# Patient Record
Sex: Male | Born: 1935 | Race: White | Hispanic: No | Marital: Married | Smoking: Former smoker
Health system: Southern US, Community
[De-identification: ages and names within clinical notes are randomized; demographics above are authoritative.]

## PROBLEM LIST (undated history)

## (undated) DIAGNOSIS — T4145XA Adverse effect of unspecified anesthetic, initial encounter: Secondary | ICD-10-CM

## (undated) DIAGNOSIS — I350 Nonrheumatic aortic (valve) stenosis: Secondary | ICD-10-CM

## (undated) DIAGNOSIS — I714 Abdominal aortic aneurysm, without rupture, unspecified: Secondary | ICD-10-CM

## (undated) DIAGNOSIS — K922 Gastrointestinal hemorrhage, unspecified: Secondary | ICD-10-CM

## (undated) DIAGNOSIS — I2510 Atherosclerotic heart disease of native coronary artery without angina pectoris: Secondary | ICD-10-CM

## (undated) DIAGNOSIS — H3530 Unspecified macular degeneration: Secondary | ICD-10-CM

## (undated) DIAGNOSIS — I4891 Unspecified atrial fibrillation: Secondary | ICD-10-CM

## (undated) DIAGNOSIS — R52 Pain, unspecified: Secondary | ICD-10-CM

## (undated) DIAGNOSIS — I442 Atrioventricular block, complete: Secondary | ICD-10-CM

## (undated) DIAGNOSIS — T8859XA Other complications of anesthesia, initial encounter: Secondary | ICD-10-CM

## (undated) DIAGNOSIS — M1990 Unspecified osteoarthritis, unspecified site: Secondary | ICD-10-CM

## (undated) DIAGNOSIS — E785 Hyperlipidemia, unspecified: Secondary | ICD-10-CM

## (undated) DIAGNOSIS — I10 Essential (primary) hypertension: Secondary | ICD-10-CM

## (undated) DIAGNOSIS — T827XXA Infection and inflammatory reaction due to other cardiac and vascular devices, implants and grafts, initial encounter: Secondary | ICD-10-CM

## (undated) DIAGNOSIS — I509 Heart failure, unspecified: Secondary | ICD-10-CM

## (undated) DIAGNOSIS — A491 Streptococcal infection, unspecified site: Secondary | ICD-10-CM

## (undated) DIAGNOSIS — Z5189 Encounter for other specified aftercare: Secondary | ICD-10-CM

## (undated) DIAGNOSIS — H269 Unspecified cataract: Secondary | ICD-10-CM

## (undated) DIAGNOSIS — Z950 Presence of cardiac pacemaker: Secondary | ICD-10-CM

## (undated) DIAGNOSIS — I339 Acute and subacute endocarditis, unspecified: Secondary | ICD-10-CM

## (undated) DIAGNOSIS — I9789 Other postprocedural complications and disorders of the circulatory system, not elsewhere classified: Secondary | ICD-10-CM

## (undated) DIAGNOSIS — D649 Anemia, unspecified: Secondary | ICD-10-CM

## (undated) HISTORY — PX: OTHER SURGICAL HISTORY: SHX169

## (undated) HISTORY — DX: Encounter for other specified aftercare: Z51.89

## (undated) HISTORY — DX: Hyperlipidemia, unspecified: E78.5

## (undated) HISTORY — PX: EYE SURGERY: SHX253

## (undated) HISTORY — PX: JOINT REPLACEMENT: SHX530

## (undated) HISTORY — PX: VASCULAR SURGERY: SHX849

## (undated) HISTORY — PX: APPENDECTOMY: SHX54

## (undated) HISTORY — PX: PROSTATE SURGERY: SHX751

## (undated) HISTORY — PX: CORONARY ARTERY BYPASS GRAFT: SHX141

## (undated) HISTORY — PX: BACK SURGERY: SHX140

## (undated) HISTORY — PX: CARDIAC VALVE REPLACEMENT: SHX585

## (undated) HISTORY — PX: HERNIA REPAIR: SHX51

## (undated) HISTORY — DX: Heart failure, unspecified: I50.9

## (undated) HISTORY — PX: SPINE SURGERY: SHX786

## (undated) HISTORY — DX: Unspecified cataract: H26.9

## (undated) HISTORY — DX: Essential (primary) hypertension: I10

## (undated) HISTORY — PX: CARDIAC CATHETERIZATION: SHX172

## (undated) HISTORY — DX: Atherosclerotic heart disease of native coronary artery without angina pectoris: I25.10

---

## 1998-08-21 ENCOUNTER — Encounter: Payer: Self-pay | Admitting: Orthopedic Surgery

## 1998-08-21 ENCOUNTER — Ambulatory Visit (HOSPITAL_BASED_OUTPATIENT_CLINIC_OR_DEPARTMENT_OTHER): Admission: RE | Admit: 1998-08-21 | Discharge: 1998-08-21 | Payer: Self-pay | Admitting: Orthopedic Surgery

## 1998-09-04 ENCOUNTER — Ambulatory Visit (HOSPITAL_BASED_OUTPATIENT_CLINIC_OR_DEPARTMENT_OTHER): Admission: RE | Admit: 1998-09-04 | Discharge: 1998-09-04 | Payer: Self-pay | Admitting: Orthopedic Surgery

## 1999-11-13 ENCOUNTER — Inpatient Hospital Stay (HOSPITAL_COMMUNITY): Admission: EM | Admit: 1999-11-13 | Discharge: 1999-11-16 | Payer: Self-pay | Admitting: Emergency Medicine

## 1999-11-14 ENCOUNTER — Encounter: Payer: Self-pay | Admitting: Interventional Cardiology

## 1999-11-15 ENCOUNTER — Encounter: Payer: Self-pay | Admitting: Interventional Cardiology

## 2004-04-16 ENCOUNTER — Encounter: Admission: RE | Admit: 2004-04-16 | Discharge: 2004-04-16 | Payer: Self-pay | Admitting: Interventional Cardiology

## 2004-04-17 ENCOUNTER — Inpatient Hospital Stay (HOSPITAL_BASED_OUTPATIENT_CLINIC_OR_DEPARTMENT_OTHER): Admission: RE | Admit: 2004-04-17 | Discharge: 2004-04-17 | Payer: Self-pay | Admitting: Interventional Cardiology

## 2004-05-21 ENCOUNTER — Encounter (INDEPENDENT_AMBULATORY_CARE_PROVIDER_SITE_OTHER): Payer: Self-pay | Admitting: *Deleted

## 2004-05-21 ENCOUNTER — Inpatient Hospital Stay (HOSPITAL_COMMUNITY): Admission: RE | Admit: 2004-05-21 | Discharge: 2004-05-26 | Payer: Self-pay | Admitting: Cardiothoracic Surgery

## 2004-06-14 ENCOUNTER — Encounter (HOSPITAL_COMMUNITY): Admission: RE | Admit: 2004-06-14 | Discharge: 2004-07-09 | Payer: Self-pay | Admitting: Interventional Cardiology

## 2006-07-25 ENCOUNTER — Ambulatory Visit (HOSPITAL_COMMUNITY): Admission: RE | Admit: 2006-07-25 | Discharge: 2006-07-25 | Payer: Self-pay | Admitting: Interventional Cardiology

## 2006-11-25 ENCOUNTER — Ambulatory Visit: Payer: Self-pay | Admitting: Pulmonary Disease

## 2010-06-25 ENCOUNTER — Encounter (INDEPENDENT_AMBULATORY_CARE_PROVIDER_SITE_OTHER): Payer: Medicare PPO | Admitting: Surgery

## 2010-06-25 ENCOUNTER — Other Ambulatory Visit: Payer: Self-pay | Admitting: Surgery

## 2010-06-25 ENCOUNTER — Encounter (INDEPENDENT_AMBULATORY_CARE_PROVIDER_SITE_OTHER): Payer: Medicare PPO

## 2010-06-25 ENCOUNTER — Other Ambulatory Visit (INDEPENDENT_AMBULATORY_CARE_PROVIDER_SITE_OTHER): Payer: Medicare PPO

## 2010-06-25 ENCOUNTER — Ambulatory Visit
Admission: RE | Admit: 2010-06-25 | Discharge: 2010-06-25 | Disposition: A | Discharge status: Home / Self Care | Payer: Medicare PPO | Source: Ambulatory Visit | Attending: Surgery | Admitting: Surgery

## 2010-06-25 DIAGNOSIS — I714 Abdominal aortic aneurysm, without rupture: Secondary | ICD-10-CM

## 2010-06-25 DIAGNOSIS — Z01810 Encounter for preprocedural cardiovascular examination: Secondary | ICD-10-CM

## 2010-06-25 MED ORDER — IOHEXOL 350 MG/ML SOLN
60.0000 mL | Freq: Once | INTRAVENOUS | Status: AC | PRN
  Administered 2010-06-25: 60 mL via INTRAVENOUS

## 2010-06-26 NOTE — Assessment & Plan Note (Signed)
OFFICE VISIT  Ethan Lawrence, REEVER DOB:  1922-03-23                                       06/25/2010 AOZHY#:[id number]  REASON FOR VISIT:  Abdominal aortic aneurysm.  HISTORY:  This is an 77 year old gentleman I am seeing at request of Dr. Katrinka Blazing for evaluation of abdominal aortic aneurysm.  His aneurysm was detected on a noncontrast scan for low back and left hip pain. Incidental finding was a 6.4 cm infrarenal abdominal aortic aneurysm. The patient is asymptomatic.  He denies abdominal pain.  His CT scan cut off at his external iliac.  It is noncontrast.  The patient has a history of coronary artery disease. He is status post CABG by Dr. Donata Clay .  He has atrial fibrillation.  He is on Coumadin. He also suffers from hypertension and hypercholesterolemia which are medically managed.  REVIEW OF SYSTEMS:  VASCULAR:  Positive for pain in legs with walking. CARDIAC:  Positive for a heart murmur and atrial fibrillation. GI:  Positive for peptic ulcer disease and hiatal hernia. ENT:  Positive for macular degeneration. MUSCULOSKELETAL:  Positive for arthritis and joint pain.  PAST MEDICAL HISTORY:  Hypertension, hypercholesterolemia, coronary artery disease, atrial fibrillation.  PAST SURGICAL HISTORY:  Appendectomy, peptic ulcer surgery, bilateral rotator cuff surgery, left knee arthroscopy, coronary bypass graft and kyphoplasty.  SOCIAL HISTORY:  He is married.  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  He is semi retired.  Does not smoke, quit 1970.  Does not drink.  FAMILY HISTORY:  His mother lived to age 68.  His father lived to age 91.  His brother had coronary artery disease.  ALLERGIES:  None.  MEDICATIONS:  Coumadin.  PHYSICAL EXAMINATION:  Heart rate 86, blood pressure 180/84, O2 sat 98%. GENERAL:  He is well-appearing, in no distress. HEENT:  Within normal limits.  The sclerae are anicteric.  Nares are without  drainage. LUNGS:  Clear bilaterally. CARDIOVASCULAR:  Irregular rate.  No murmur.  Bilateral carotids are without bruits.  He has palpable femoral pulses.  I cannot palpate pedal pulses on the right. MUSCULOSKELETAL:  Without major deformity. NEURO:  No focal deficit. SKIN:  Without rash.  DIAGNOSTICS:  Carotid duplex was performed today which shows minimal bilateral carotid stenosis.  ABI on the right is 1.2 with biphasic waveforms.  The left has biphasic pacing waveforms with noncompressible vessels.  ASSESSMENT/PLAN:  Large infrarenal abdominal aortic aneurysm.  PLAN:  After review of his noncontrasted images, I believe he is going to be a suitable candidate for endovascular repair.  I do think he needs a  CT scan with IV contrast as his current study does not have contrast and it cut off at the level of the internal iliacs bilaterally.  I will need to access his external iliac to see if he is a candidate for percutaneous repair, as well as proper sizing.  I discussed the risks and benefits of the operation.  These include cardiopulmonary complications, lower extremity complications, GI issues and bleeding. We diagrammed out the details of repair.  We discussed the possibility of a type 2 leak.  I am sending him for a CT scan today.  He is going to stop his Coumadin 5 days prior to his operation.  His operation has been scheduled for Thursday April 26.    Jorge Ny, MD  Electronically Signed  VWB/MEDQ  D:  06/25/2010  T:  06/26/2010  Job:  [id number]  cc:   Dr. Cristela Blue, M.D.

## 2010-07-03 NOTE — Procedures (Unsigned)
CAROTID DUPLEX EXAM  INDICATION:  Preoperative testing for AAA repair.  HISTORY: Diabetes:  No. Cardiac:  CABG x4, CHF, atrial fibrillation. Hypertension:  Yes. Smoking:  No. Previous Surgery:  No. CV History:  Currently asymptomatic. Amaurosis Fugax No, Paresthesias No, Hemiparesis No                                      RIGHT             LEFT Brachial systolic pressure:         119               113 Brachial Doppler waveforms:         Normal            Normal Vertebral direction of flow:        Antegrade         Antegrade DUPLEX VELOCITIES (cm/sec) CCA peak systolic                   50                42 ECA peak systolic                   35                35 ICA peak systolic                   54                48 ICA end diastolic                   26                27 PLAQUE MORPHOLOGY:                  Intimal wall thickening             Heterogeneous PLAQUE AMOUNT:                      Minimal           Minimal PLAQUE LOCATION:                    CCA               ICA  IMPRESSION: 1. Right internal carotid artery velocities are within normal limits. 2. Left internal carotid artery velocities suggest 1% to 39% stenosis. 3. Bilateral vertebral arteries are antegrade.  ___________________________________________ V. Charlena Cross, MD  EM/MEDQ  D:  06/25/2010  T:  06/25/2010  Job:  [id number]

## 2010-07-04 ENCOUNTER — Ambulatory Visit (HOSPITAL_COMMUNITY)
Admission: RE | Admit: 2010-07-04 | Discharge: 2010-07-04 | Disposition: A | Discharge status: Home / Self Care | Payer: Medicare PPO | Source: Ambulatory Visit | Attending: Surgery | Admitting: Surgery

## 2010-07-04 ENCOUNTER — Encounter (HOSPITAL_COMMUNITY)
Admission: RE | Admit: 2010-07-04 | Discharge: 2010-07-04 | Disposition: A | Discharge status: Home / Self Care | Payer: Medicare PPO | Source: Ambulatory Visit | Attending: Surgery | Admitting: Surgery

## 2010-07-04 ENCOUNTER — Other Ambulatory Visit: Payer: Self-pay | Admitting: Surgery

## 2010-07-04 ENCOUNTER — Inpatient Hospital Stay (HOSPITAL_COMMUNITY)
Admission: EM | Admit: 2010-07-04 | Discharge: 2010-07-06 | DRG: 238 | Disposition: A | Discharge status: Home / Self Care | Payer: Medicare PPO | Source: Ambulatory Visit | Attending: Surgery | Admitting: Surgery

## 2010-07-04 DIAGNOSIS — I2510 Atherosclerotic heart disease of native coronary artery without angina pectoris: Secondary | ICD-10-CM | POA: Diagnosis present

## 2010-07-04 DIAGNOSIS — I10 Essential (primary) hypertension: Secondary | ICD-10-CM | POA: Diagnosis present

## 2010-07-04 DIAGNOSIS — I714 Abdominal aortic aneurysm, without rupture, unspecified: Secondary | ICD-10-CM | POA: Insufficient documentation

## 2010-07-04 DIAGNOSIS — Z01810 Encounter for preprocedural cardiovascular examination: Secondary | ICD-10-CM

## 2010-07-04 DIAGNOSIS — Z01812 Encounter for preprocedural laboratory examination: Secondary | ICD-10-CM

## 2010-07-04 DIAGNOSIS — Z951 Presence of aortocoronary bypass graft: Secondary | ICD-10-CM

## 2010-07-04 DIAGNOSIS — Z01818 Encounter for other preprocedural examination: Secondary | ICD-10-CM | POA: Insufficient documentation

## 2010-07-04 DIAGNOSIS — Z87891 Personal history of nicotine dependence: Secondary | ICD-10-CM

## 2010-07-04 LAB — URINALYSIS, ROUTINE W REFLEX MICROSCOPIC
Bilirubin Urine: NEGATIVE
Ketones, ur: NEGATIVE mg/dL
Specific Gravity, Urine: 1.02 (ref 1.005–1.030)
Urobilinogen, UA: 1 mg/dL (ref 0.0–1.0)

## 2010-07-04 LAB — BLOOD GAS, ARTERIAL
Acid-base deficit: 2.9 mmol/L — ABNORMAL HIGH (ref 0.0–2.0)
O2 Saturation: 98.4 %
Patient temperature: 98.6
pH, Arterial: 7.439 (ref 7.350–7.450)

## 2010-07-04 LAB — TYPE AND SCREEN
ABO/RH(D): O NEG
Antibody Screen: NEGATIVE

## 2010-07-04 LAB — CBC
Hemoglobin: 15.5 g/dL (ref 13.0–17.0)
RBC: 4.83 MIL/uL (ref 4.22–5.81)

## 2010-07-04 LAB — APTT: aPTT: 25 seconds (ref 24–37)

## 2010-07-04 LAB — COMPREHENSIVE METABOLIC PANEL
ALT: 22 U/L (ref 0–53)
Albumin: 3.9 g/dL (ref 3.5–5.2)
BUN: 47 mg/dL — ABNORMAL HIGH (ref 6–23)
Calcium: 8.7 mg/dL (ref 8.4–10.5)
Chloride: 108 mEq/L (ref 96–112)
Potassium: 5.2 mEq/L — ABNORMAL HIGH (ref 3.5–5.1)
Total Bilirubin: 1.1 mg/dL (ref 0.3–1.2)

## 2010-07-04 LAB — PROTIME-INR: Prothrombin Time: 13.3 seconds (ref 11.6–15.2)

## 2010-07-04 LAB — URINE MICROSCOPIC-ADD ON

## 2010-07-05 ENCOUNTER — Observation Stay (HOSPITAL_COMMUNITY): Admission: RE | Admit: 2010-07-05 | Payer: Medicare PPO | Source: Ambulatory Visit | Admitting: Surgery

## 2010-07-05 ENCOUNTER — Inpatient Hospital Stay (HOSPITAL_COMMUNITY): Payer: Medicare PPO

## 2010-07-05 DIAGNOSIS — I714 Abdominal aortic aneurysm, without rupture: Secondary | ICD-10-CM

## 2010-07-05 HISTORY — PX: ENDOVASCULAR STENT INSERTION: SHX5161

## 2010-07-05 LAB — BASIC METABOLIC PANEL
BUN: 40 mg/dL — ABNORMAL HIGH (ref 6–23)
CO2: 26 mEq/L (ref 19–32)
CO2: 20 mEq/L (ref 19–32)
Calcium: 8.8 mg/dL (ref 8.4–10.5)
Chloride: 108 mEq/L (ref 96–112)
Creatinine, Ser: 1.9 mg/dL — ABNORMAL HIGH (ref 0.4–1.5)
GFR calc Af Amer: 41 mL/min — ABNORMAL LOW (ref >60)
GFR calc non Af Amer: 41 mL/min — ABNORMAL LOW (ref >60)
Glucose, Bld: 107 mg/dL — ABNORMAL HIGH (ref 70–99)
Sodium: 136 mEq/L (ref 135–145)

## 2010-07-05 LAB — CBC
HCT: 42.8 % (ref 39.0–52.0)
Hemoglobin: 13.4 g/dL (ref 13.0–17.0)
MCH: 31.5 pg (ref 26.0–34.0)
MCHC: 33.6 g/dL (ref 30.0–36.0)
MCV: 92.5 fL (ref 78.0–100.0)
Platelets: 123 10*3/uL — ABNORMAL LOW (ref 150–400)
RBC: 4.25 MIL/uL (ref 4.22–5.81)
RDW: 12.9 % (ref 11.5–15.5)
RDW: 12.8 % (ref 11.5–15.5)
WBC: 11.7 10*3/uL — ABNORMAL HIGH (ref 4.0–10.5)

## 2010-07-05 LAB — MAGNESIUM: Magnesium: 1.9 mg/dL (ref 1.5–2.5)

## 2010-07-05 LAB — APTT: aPTT: 30 seconds (ref 24–37)

## 2010-07-06 LAB — BASIC METABOLIC PANEL
BUN: 31 mg/dL — ABNORMAL HIGH (ref 6–23)
Chloride: 107 mEq/L (ref 96–112)
Creatinine, Ser: 1.56 mg/dL — ABNORMAL HIGH (ref 0.4–1.5)
GFR calc non Af Amer: 42 mL/min — ABNORMAL LOW (ref >60)
Glucose, Bld: 90 mg/dL (ref 70–99)
Potassium: 4.5 mEq/L (ref 3.5–5.1)

## 2010-07-06 LAB — CBC
HCT: 39.6 % (ref 39.0–52.0)
MCH: 31.8 pg (ref 26.0–34.0)
MCV: 93.8 fL (ref 78.0–100.0)
Platelets: 94 10*3/uL — ABNORMAL LOW (ref 150–400)
RDW: 13 % (ref 11.5–15.5)

## 2010-07-09 ENCOUNTER — Other Ambulatory Visit: Payer: Self-pay | Admitting: Surgery

## 2010-07-09 DIAGNOSIS — I714 Abdominal aortic aneurysm, without rupture: Secondary | ICD-10-CM

## 2010-07-19 ENCOUNTER — Encounter (HOSPITAL_COMMUNITY)
Admission: RE | Admit: 2010-07-19 | Discharge: 2010-07-19 | Disposition: A | Discharge status: Home / Self Care | Payer: Medicare PPO | Source: Ambulatory Visit | Attending: Neurosurgery | Admitting: Neurosurgery

## 2010-07-19 LAB — BASIC METABOLIC PANEL
BUN: 24 mg/dL — ABNORMAL HIGH (ref 6–23)
CO2: 28 mEq/L (ref 19–32)
Calcium: 9.3 mg/dL (ref 8.4–10.5)
Chloride: 107 mEq/L (ref 96–112)
Creatinine, Ser: 1.84 mg/dL — ABNORMAL HIGH (ref 0.4–1.5)
Glucose, Bld: 86 mg/dL (ref 70–99)

## 2010-07-19 LAB — CBC
MCH: 31.4 pg (ref 26.0–34.0)
MCV: 96.2 fL (ref 78.0–100.0)
Platelets: 146 10*3/uL — ABNORMAL LOW (ref 150–400)
RBC: 4.17 MIL/uL — ABNORMAL LOW (ref 4.22–5.81)
RDW: 13 % (ref 11.5–15.5)

## 2010-07-19 LAB — PROTIME-INR: Prothrombin Time: 18.4 seconds — ABNORMAL HIGH (ref 11.6–15.2)

## 2010-07-19 LAB — DIFFERENTIAL
Basophils Relative: 0 % (ref 0–1)
Eosinophils Absolute: 0.2 10*3/uL (ref 0.0–0.7)
Eosinophils Relative: 4 % (ref 0–5)
Lymphs Abs: 1.2 10*3/uL (ref 0.7–4.0)
Monocytes Relative: 7 % (ref 3–12)
Neutrophils Relative %: 65 % (ref 43–77)

## 2010-07-23 ENCOUNTER — Inpatient Hospital Stay (HOSPITAL_COMMUNITY): Payer: Medicare PPO

## 2010-07-23 ENCOUNTER — Ambulatory Visit (HOSPITAL_COMMUNITY)
Admission: RE | Admit: 2010-07-23 | Discharge: 2010-07-24 | Disposition: A | Discharge status: Home Health | Payer: Medicare PPO | Source: Ambulatory Visit | Attending: Neurosurgery | Admitting: Neurosurgery

## 2010-07-23 DIAGNOSIS — I10 Essential (primary) hypertension: Secondary | ICD-10-CM | POA: Insufficient documentation

## 2010-07-23 DIAGNOSIS — Z01812 Encounter for preprocedural laboratory examination: Secondary | ICD-10-CM | POA: Insufficient documentation

## 2010-07-23 DIAGNOSIS — M48061 Spinal stenosis, lumbar region without neurogenic claudication: Principal | ICD-10-CM | POA: Insufficient documentation

## 2010-07-23 DIAGNOSIS — J449 Chronic obstructive pulmonary disease, unspecified: Secondary | ICD-10-CM | POA: Insufficient documentation

## 2010-07-23 DIAGNOSIS — G8929 Other chronic pain: Secondary | ICD-10-CM | POA: Insufficient documentation

## 2010-07-23 DIAGNOSIS — J4489 Other specified chronic obstructive pulmonary disease: Secondary | ICD-10-CM | POA: Insufficient documentation

## 2010-07-23 DIAGNOSIS — Z01810 Encounter for preprocedural cardiovascular examination: Secondary | ICD-10-CM | POA: Insufficient documentation

## 2010-07-23 NOTE — Op Note (Signed)
NAME:  Ethan Lawrence, Ethan Lawrence         ACCOUNT NO.:  [id number]  MEDICAL RECORD NO.:  [id number]           PATIENT TYPE:  I  LOCATION:  3306                         FACILITY:  [facility]  PHYSICIAN:  Juleen China IV, MDDATE OF BIRTH:  [birth year]  DATE OF PROCEDURE:  07/05/2010 DATE OF DISCHARGE:                              OPERATIVE REPORT   PREOPERATIVE DIAGNOSIS:  Abdominal aortic aneurysm.  POSTOPERATIVE DIAGNOSIS:  Abdominal aortic aneurysm.  PROCEDURE PERFORMED: 1. Endovascular abdominal aortic aneurysm repair. 2. Bilateral percutaneous access. 3. Catheter in aorta x2. 4. Abdominal aortogram. 5. First-order catheterization (right renal artery). 6. Right renal angiogram.  SURGEON: 1. Durene Cal IV, MD  ANESTHESIA:  General.  ESTIMATED BLOOD LOSS:  Minimal.  COMPLICATIONS:  None.  FINDINGS:  Complete exclusion.  INDICATIONS:  Ethan Lawrence is an 77 year old gentleman who had an incidental finding of a 6.4 cm aneurysm when being studied for back pain.  The CT scan determined him to be a candidate for endovascular repair.  He comes in today for his procedure.  PROCEDURE IN DETAIL:  The patient was identified in the holding area, taken to room 9, and placed supine on the table.  General anesthesia was administered.  The patient was prepped and draped in the usual fashion. Time-out was called.  Antibiotics were given.  Ultrasound was used to evaluate bilateral common femoral arteries which had minimal anterior calcification and an #11 blade was used make a skin nick bilaterally. Both common femoral arteries were then accessed under ultrasound guidance with an 18-gauge needle.  Bentson wires were advanced into the aorta under fluoroscopic visualization.  ProGlide devices were placed for preclosure.  I deployed two in each groin, one at the 11 o'clock position and one at the 1 o'clock position.  Next, 8-French sheaths were placed bilaterally.  The right side was  selected as the primary device side and therefore using a Kumpe catheter wire exchange was performed and Amplatz Super Stiff wire was placed.  This was followed by removal of the 8-French sheath and placement of an 18-French dry steel sheath. The patient was fully heparinized.  A Omni flush catheter was advanced up to the left leg and an abdominal aortic angiogram was performed trying to identify the location of the renal arteries.  There was very poor visualization with the images that we saw what we felt was the right renal artery which was known to be the lowest renal artery.  The device was then prepared on the back table and then advanced through the dry steel sheath.  This was a Biomedical scientist 26 x 14 x 12.  The device was then deployed down to the contralateral gate.  Next, using a Kumpe catheter and the Bentson wire, the contralateral gate was cannulated. An Omni flush catheter was able to be freely rotated within the graft confirming successful cannulation.  At this point, we took another arteriogram to confirm the location of the graft in regards to renal arteries.  Again, it was difficult to see the location of the renal artery.  I therefore elected to selectively cannulate the right renal artery since this was the lowest renal artery.  A Motarjeme catheter was selected.  The right renal artery was cannulated.  The contrast injections was performed through the Motarjeme catheter with its tip in the right renal artery confirming that this was right renal artery. This identified that the graft was slightly on the low side.  It was then re-constrained and advanced, so that it was deployed at the level of the right renal artery.  Next, the image intensifier was pulled down to a right anterior oblique position and retrograde injection was performed through the left groin identifying the left hypogastric artery.  The contralateral limb was then deployed.  This was a Biomedical scientist 16 x  11.5.  Next, the remaining portion of the main body device was deployed.  A retrograde sheath injection was performed through the right groin with the image intensifier in the left anterior oblique position and dislocated the right hypogastric artery.  A distal extension on the right was deployed.  This was a Biomedical scientist 16 x 11.5.  Next, the Q50 balloon was used to mold the entire portion of the graft.  A completion arteriogram was then performed which showed complete exclusion of the aneurysm sac.  I confirmed patency of the right renal artery by recannulating it.  At this point, I was satisfied with our results.  The sheaths were removed over Bentson wires and the groins were closed using the previously placed ProGlide devices. Protamine was used to reverse the heparin.  The wires were removed. Once protamine had been administered, the sutures from the ProGlide were trimmed.  Both groins were hemostatic.  The skin was closed with 4-0 Vicryl.  The patient tolerated the procedure well.  ASSISTANT:  Pecola Leisure, PA.  DEVICES USED:  Main body was primary right Gore Excluder 26 x 14 x 12, ipsilateral extension was a Gore Excluder 16 x 11.5, contralateral leg was left 16 x 9.5.     Juleen China IV, MD     VWB/MEDQ  D:  07/05/2010  T:  07/05/2010  Job:  [id number]  cc:   Kerin Perna, M.D. Larena Glassman, M.D.  Electronically Signed by Arelia Longest IV MD on 07/23/2010 10:58:42 PM

## 2010-07-24 NOTE — Consult Note (Signed)
NAME:  Ethan Lawrence, INGWERSEN NO.:  [id number]   MEDICAL RECORD NO.:  [id number]          PATIENT TYPE:  OIB   LOCATION:  2899                         FACILITY:  [facility]   PHYSICIAN:  Lyn Records, M.D.   DATE OF BIRTH:  August 30, 1922   DATE OF CONSULTATION:  07/25/2006  DATE OF DISCHARGE:                                 CONSULTATION   INDICATION:  Atrial fibrillation.   DATE OF PROCEDURE:  Jul 25, 2006.   PROCEDURE PERFORMED:  Elective electrical cardioversion.   DESCRIPTION:  Anesthesia was supervised by Dr. Arta Bruce.  175 mg of  sodium thiopental was administered.  Once asleep, the patient was  shocked with 200 joules in the synchronized mode and reverted to normal  sinus rhythm.  One discharge was required.   He awakened from the procedure without evidence of neurological sequela.   INR on the day of the procedure was 2.   CONCLUSION:  Successful conversion to normal sinus rhythm.   PLAN:  Home later today.  Continue same meds.      Lyn Records, M.D.  Electronically Signed     HWS/MEDQ  D:  07/25/2006  T:  07/25/2006  Job:  [id number]   cc:   Elveria Royals

## 2010-07-24 NOTE — Assessment & Plan Note (Signed)
[facility]                             PULMONARY OFFICE NOTE   HERMON, ZEA                MRN:          [id number]  DATE:11/25/2006                            DOB:          [birth year]    HISTORY OF PRESENT ILLNESS:  Patient is a very pleasant 77 year old  white male who I have been asked to see for dyspnea and also a question  of asbestosis.  The patient lives in [place] and is visiting his  daughter here in [place].  He sees Dr. Katrinka Blazing for atrial fibrillation  as well as diastolic dysfunction.  The patient states that he has had  increasing shortness of breath over the last 3-4 months that primarily  is with exertion and also with bending over.  He states that he has  approximately 1 block dyspnea on exertion at a moderate pace on flat  ground and will get winded bringing 1 bag of groceries in from the car,  especially if he has to stoop to pick them up.  Patient denies any cough  or mucus production.  He has had only intermittent edema in his lower  extremities however the daughter states that his breathing problem is  always the worst when his edema is the worst.  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  He stated the air was so thick with  the fibers at times that they had to go outside in order to get a break.  Patient does have a history of smoking 1 pack per day for 20 years but  he has not smoked in 25 years.  He states he did have pulmonary function  studies approximately 5 years ago but was never told anything about  them.   PAST MEDICAL HISTORY:  1. Coronary disease.  He is status post CABG in 2006.  2. History of hypertension.  3. History of chronic atrial fibrillation with DC cardioversion      approximately 3 months ago that was not successful according to the      patient.  4. History of diastolic dysfunction.  5.  Status post cholecystectomy.  6. History of arthritis which sounds like osteoarthritis for which he      sees Dr. Phylliss Bob and is on prednisone 5 mg a day chronically.   CURRENT MEDICATIONS:  1. Celebrex 100 mg daily.  2. Prednisone 5 mg daily.  3. Warfarin in varying doses.  4. Folic acid 1 mg daily.  5. Niferex 150 daily.  6. Zocor 20 mg daily.  7. Maxzide 37.5/25 one-half tab daily p.r.n.  8. Eye drops of unknown type b.i.d.   PATIENT HAS NO KNOWN DRUG ALLERGIES.   The patient is married and has children.  He is semi-retired as an  Librarian, academic.  He is from [place].  Smoking history is per  history of present illness.   FAMILY HISTORY:  Remarkable for his father having emphysema and his  brother having heart disease as well as having some type of hematologic  cancer.   REVIEW OF SYSTEMS:  As per history of present illness, also see patient  intake form documented in the chart.   PHYSICAL EXAMINATION:  GENERAL:  He is a well-developed male in no acute  distress.  Blood pressure is 116/78, pulse 85, temperature 98, weight is  193 pounds, he is 6 foot tall, O2 saturation on room air is 97%.  HEENT:  Pupils equal, round, reactive to light and accommodation.  Extraocular muscles are intact.  Nares are patent without discharge,  oropharynx is clear.  NECK:  Supple without JVD or lymphadenopathy.  There is no palpable  thyromegaly.  CHEST:  Fairly clear to auscultation with minimal basilar crackles.  CARDIAC:  This time is surprisingly regular with a 2/6 systolic murmur.  ABDOMEN:  Soft, nontender with good bowel sounds.  Genital exam, rectal exam, breast exam was not done and not indicated.  LOWER EXTREMITIES:  Are without significant edema, pulses are intact  distally.  NEUROLOGIC:  Alert and oriented with no obvious motor deficits.   IMPRESSION:  1. Dyspnea on exertion that is probably multifactorial.  The patient      has a known history of diastolic dysfunction  and atrial      fibrillation, however I suspect he has some element of obstructive      lung disease given his history of dyspnea upon bending over as well      as his smoking history.  At this point in time the goal is to      maximize quality of life, therefore I would like to see if he has      obstructive lung disease, and if he does to get him on some type of      bronchodilator regimen which may help his exertional tolerance.  2. Questionable asbestos related lung disease.  The patient has very      significant exposure in the past and does have basilar scarring on      his x-ray from April of 2008.  He will need a CT of the chest in      order to look closer at this.   PLAN:  1. Will schedule for pulmonary function studies.  2. High resolution CT of the chest to rule out interstitial disease      and also to look at his pleural space.     Barbaraann Share, MD,FCCP  Electronically Signed    KMC/MedQ  DD: 11/25/2006  DT: 11/25/2006  Job #: [id number]   cc:   Garnette Scheuermann, MD

## 2010-07-24 NOTE — H&P (Signed)
NAME:  Ethan Lawrence, Ethan Lawrence NO.:  [id number]   MEDICAL RECORD NO.:  [id number]          PATIENT TYPE:  OIB   LOCATION:  2899                         FACILITY:  [facility]   PHYSICIAN:  Lyn Records, M.D.   DATE OF BIRTH:  [birth year]   DATE OF ADMISSION:  07/25/2006  DATE OF DISCHARGE:                              HISTORY & PHYSICAL   PRIMARY CARE PHYSICIAN:  Meryle Ready, MD, in [place], [place].   CARDIOLOGIST:  Verdis Prime, MD.   REASON FOR EVALUATION:  Paroxysmal atrial fibrillation.   HISTORY OF PRESENT ILLNESS:  [name] is an 77 year old Caucasian  male with a known history of coronary artery disease status post CABG in  2006, history of CHF, and paroxysmal atrial fibrillation.  He failed  sotalol therapy and is currently on amiodarone and Toprol XL daily.  He  is also on systemic anticoagulation with Coumadin therapy with a  therapeutic INR of 2.0 on today.  He is being admitted for an outpatient  DCCV for possible conversion to normal sinus rhythm.  On today the  patient denies chest pain, palpitations, dizziness, fatigue, shortness  of breath, near-syncope, or syncope.   PAST MEDICAL HISTORY:  1. Paroxysmal atrial fibrillation.  2. Status post CABG in 2006.  3. History of CHF.  4. Chronic renal insufficiency.  5. Polymyalgia rheumatica.  6. Systemic anticoagulation with Coumadin therapy.  7. Amiodarone therapy.  8. Dyslipidemia.   ALLERGIES:  No known drug allergies.   CURRENT MEDICATIONS:  1. Prednisone 5 mg daily.  2. Warfarin 5 mg as directed.  3. Zocor 20 mg daily.  4. PreserVision 1 tablet twice daily.  5. Celebrex 200 mg daily.  6. Toprol XL 50 mg 1/2 tablet daily.  7. Amiodarone 200 mg daily.   FAMILY HISTORY:  Insignificant for early coronary artery disease.  Both  parents are deceased and lives to be in their 10s and 90s.   SOCIAL HISTORY:  Married with one living child and one deceased child.  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  He denies tobacco use in that he smoked a pack per day for 25  years, however admitted to cessation for the past 25 years.  He also  denies alcohol or illicit drug use.   REVIEW OF SYSTEMS:  All other systems reviewed are negative other than  what is stated in the HPI.   PHYSICAL EXAMINATION:  GENERAL:  77 year old male, pleasant and  cooperative, NAD.  VITALS:  Temperature 97.1, pulse 69, respirations 18, blood pressure  140/81, O2 saturations 99% over room air.  HEENT: Unremarkable.  NECK:  Supple without JVD or bilateral carotid bruits.  Carotid  upstrokes 2+.  PULMONARY:  Breath sounds are equal and clear to auscultation  bilaterally.  No use of accessory muscles.  CARDIOVASCULAR:  Irregularly irregular with a 2/6 systolic murmur.  Normal S1 and S2.  ABDOMEN:  Benign.  EXTREMITIES:  No peripheral edema, clubbing, or cyanosis.  DP and PT  pulses 2+/2, bilaterally.  SKIN:  Warm and dry without rashes or lesions.  NEUROLOGIC:  No  focal motor or sensory deficits.  PSYCHIATRIC:  Normal mood and affect.   LABORATORY DATA:  INR Jul 25, 2006, 2.0.   ASSESSMENT:  1. Paroxysmal atrial fibrillation.  2. Systemic anticoagulation with Coumadin therapy with a therapeutic      INR.  3. Coronary artery disease status post coronary artery bypass graft in      2006.  4. History of congestive heart failure.  5. Polymyalgia rheumatica.  6. Dyslipidemia.  7. Otherwise as stated in the past medical history.   PLAN:  DCCV on Jul 25, 2006 at 9 o'clock a.m. to be performed by Dr.  Verdis Prime.  The DCCV procedure, risks, and potential complications  have been explained to the patient in detail including anesthesia,  bradyarrhythmias that would require external pacing, or other  arrhythmias that would require repeat cardioversion.  The patient admits  to full understanding of the information and wishes to proceed.      Tylene Fantasia, [place]      Lyn Records, M.D.  Electronically Signed    RDM/MEDQ  D:  07/25/2006  T:  07/25/2006  Job:  [id number]   cc:   Meryle Ready, M.D.

## 2010-07-26 NOTE — Op Note (Signed)
  NAME:  Ethan Lawrence, Ethan Lawrence NO.:  [id number]  MEDICAL RECORD NO.:  [id number]           PATIENT TYPE:  O  LOCATION:  3533                         FACILITY:  [facility]  PHYSICIAN:  Kathaleen Maser. Orbie Grupe, M.D.    DATE OF BIRTH:  05/01/1922  DATE OF PROCEDURE:  07/23/2010 DATE OF DISCHARGE:                              OPERATIVE REPORT   SERVICE:  Neurosurgery.  PREOPERATIVE DIAGNOSIS:  L3-4 stenosis.  POSTOPERATIVE DIAGNOSIS:  L3-4 stenosis.  PROCEDURE NAME:  L3-4 decompressive laminectomy with bilateral L3 and L4 decompressive foraminotomy.  SURGEON:  Kathaleen Maser. Ryver Poblete, MD  ASSISTANT:  Reinaldo Meeker, MD  ANESTHESIA:  General endotracheal.  INDICATIONS:  Ethan Lawrence is an 77 year old male with history of bilateral lower extremity pain, paresthesias and weakness.  The patient is status post previous L4 compression fracture with subsequent vertebroplasty.  The patient has marked stenosis at L3-4.  The patient has minimal back pain.  He presents now for bilateral L3-4 decompression.  OPERATIVE NOTE:  The patient was placed on operating room table in supine position.  After adequate level of anesthesia was achieved, the patient was placed prone onto Wilson frame, appropriately padded.  The patient's lumbar region was prepped and draped in sterilely.  A 10-blade was used to make a curvilinear skin incision overlying the L1-2 interspace.  This was carried down sharply in the midline. Subperiosteal dissection was then performed exposing the lamina and facet joints of L1 and L2.  Deep self-retaining retractor was placed. Intraoperative x-ray was taken and in fact the approach have been made to the L4-5 level.  Dissection then redirected one at level cephalad. Retractor was replaced.  Decompressive laminectomy was then performed using Leksell rongeurs, Kerrison rongeurs, high-speed drill to remove the inferior 2/3rd lamina of L3, medial aspect of the L3-4 facet  joint, superior rim of the L4.  Ligamentum flavum was then elevated and resected in a piecemeal fashion.  Underlying thecal sac was then identified.  Wide decompressive foraminotomy was then performed along the course exiting L3 and L4 nerve roots bilaterally by undercutting the facet joints and removing hypertrophic ligament and bone.  At this point a very thorough decompression had been achieved.  There was no injury to thecal sac or nerve roots.  Wound was then irrigated with antibiotic solution.  Gelfoam was placed topically for hemostasis, found to be good.  Microscope dressings were removed.  Hemostasis of muscle was achieved with electrocautery.  Wounds were then closed in layers of Vicryl suture.  Steri-Strips and sterile dressings were applied.  No complications The patient was well and he returned to recovery room postoperatively.          ______________________________ Kathaleen Maser Cynda Soule, M.D.     HAP/MEDQ  D:  07/23/2010  T:  07/23/2010  Job:  [id number]  Electronically Signed by Julio Sicks M.D. on 07/26/2010 11:20:16 AM

## 2010-07-27 NOTE — Discharge Summary (Signed)
[facility]  Patient:    Ethan Lawrence, Ethan Lawrence                MRN: [id number] Adm. Date:  [id number] Disc. Date: [id number] Attending:  Lyn Records. Iii Dictator:   Anselm Lis, N.P. CC:         Nona Dell, M.D., [place], [place]   Discharge Summary  DATE OF BIRTH:  [birth year]  PROCEDURES:   Adenosine Cardiolite which was negative for significant ischemia. Decreased perfusion seen at inferior wall likely related to diaphragmatic attenuation rather than an area of scarring.  Left ventricular ejection fraction 58%.  DISCHARGE DIAGNOSIS: 1. Atrial fibrillation with rapid ventricular response secondary to the    abrupt withdrawal of beta blocker.  Ethan Lawrence is a pleasant    77 year old male who was admitted with recent onset of atrial fibrillation    with rapid ventricular response likely precipitated, exacerbated by abrupt    discontinuation of beta blockers five days prior to admission.  The patient    converted to sinus rhythm shortly after admission after administration of    IV Cardizem.  He was initiated on Betapace shortly after admission with    maintenance of normal sinus rhythm.  His QTC was 435 msec on November 15, 1999.  He underwent an adenosine Cardiolite which was negative for events    of ischemia with good ejection fraction of 58%.  He was discharged to home    with anticipation of primary care Sarahelizabeth Conway, Dr. Andria Meuse, to start management    of Coumadin therapy as patient lives outlying in [place].  The    patient was discharged on Betapace 80 mg p.o. b.i.d. and will need a    followup EKG at time of office visit. 2. History of degenerative joint disease/arthritis for which he takes chronic    prednisone, followed by Dr. Phylliss Bob. 3. History of hypertension with blood pressure within acceptable range during    the course of this admission 4. History of depression, recently started on Prozac by primary  care.  PLAN: 1. Patient discharged to home in stable and improved condition. 2. Discharge medications:    a. (New) Betapace 80 mg p.o. b.i.d.    b. Prednisone 5 mg p.o. q.d.    c. Prozac 20 mg p.o. q.h.s.    d. Quinine q.h.s., dosage as before. 3. Activity: As before, no restrictions. 4. Diet: No added salt. 5. Followup: Dr. Verdis Prime in approximately one month.  Will need an EKG    at that office visit.  PAST MEDICAL HISTORY: 1. Remote peptic ulcer disease and subsequently partial gastrectomy in the 1970s. 2. Degenerative joint disease/arthritis for which he takes prednisone, followed by Dr. Phylliss Bob. 3. History of hypertension. 4. History of depression.  Recently started on Prozac 20 mg a day. 5. History of bilateral rotator cuff repair with left done in June 2000 and    right done April 2001. 6. History of cholecystectomy, TURP x 2, bilateral hernia repair. 7. History of ______ syndrome, quinine with improvement.  LABORATORY TESTS AND DATA:  TSH within normal range at 1.022.  Serial cardiac enzymes: First set CK 32, MB fraction 1.1, troponin I 0.17.   Second CK 31, MB fraction 1.2, troponin I 0.20.  Third CK 26, MB fraction 0.3, troponin I 0.23.  LFTs within normal range.  Sodium 137, potassium 4.3, chloride 105, CO2 27, glucose 84, BUN 23, creatinine 2.0, and calcium  8.7.  Admission creatinine was 2.3.  Albumin decreased at 3.3.  Coags: Pro time 13, INR 1, PTT 27.  WBC 6.6, hemoglobin 12.3, hematocrit 34.3, and platelets 287.  Admission chest x-ray revealed no active disease, chronic changes including chronic calcified granulomas and some areas of scarring.  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  Inferior Q waves were old by comparison with office EKG. DD:  12/07/99 TD:  12/08/99 Job: [id number]

## 2010-07-27 NOTE — Op Note (Signed)
NAME:  Ethan Lawrence, Ethan Lawrence NO.:  [id number]   MEDICAL RECORD NO.:  [id number]          PATIENT TYPE:  INP   LOCATION:  2303                         FACILITY:  [facility]   PHYSICIAN:  Kathlee Nations Trigt III, M.D.DATE OF BIRTH:  08/18/1922   DATE OF PROCEDURE:  05/21/2004  DATE OF DISCHARGE:                                 OPERATIVE REPORT   OPERATION:  Coronary artery bypass grafting x4 (left internal mammary artery  to left anterior descending, saphenous vein graft to ramus intermediate,  saphenous vein graft to circumflex marginal, saphenous vein graft to right  coronary artery).   PREOPERATIVE DIAGNOSIS:  Class IV progressive angina with severe three-  vessel coronary artery disease.   POSTOPERATIVE DIAGNOSIS:  Class IV progressive angina with severe three-  vessel coronary artery disease.   SURGEON:  Kerin Perna, M.D.   ASSISTANTS:  1.  Evelene Croon, M.D.  2.  Rowe Clack, P.A.-C.   ANESTHESIA:  General by Dr. Laverle Hobby.   INDICATIONS:  The patient is an 77 year old male who has had exertional  shortness of breath and chest tightness.  Cardiac catheterization by Dr.  Garnette Scheuermann demonstrated severe three-vessel coronary disease with an  occluded right coronary and high-grade lesions of the LAD and circumflex  systems.  His EF was 50%.  He had no significant mitral or aortic valvular  abnormalities.  He was felt to be a candidate for surgical  revascularization.   Prior to surgery, I examined the patient in the office and reviewed the  results of the cardiac catheterization with the patient and his family.  I  discussed the indications and expected benefits of coronary artery bypass  surgery for treatment of his coronary artery disease.  I reviewed with the  patient the major aspects of the planned procedure including the choice of  conduit to include left mammary artery and endoscopically harvested  saphenous vein, the location of the surgical  incisions, the use of general  anesthesia and cardiopulmonary bypass, and the expected postoperative  hospital recovery.  I discussed with the patient the risks to him of  coronary bypass surgery including the risks of MI, CVA, bleeding, infection,  and death.  He understood these implications for the surgery and agreed to  proceed with operation as planned under what I felt was an informed consent.   OPERATIVE FINDINGS:  The patient's heart was dilated and globular.  The  coronaries were severely diseased.  The saphenous vein was of adequate  quality.  The mammary artery was a good vessel with adequate flow.  No blood  products were administered.  The patient was kept on renal-dose dopamine due  to an elevated preoperative creatinine.   PROCEDURE:  The patient was brought to operating room and placed on the  operating table and general anesthesia was induced under invasive  hemodynamic monitoring.  The chest, abdomen and legs were prepped with  Betadine and draped as a sterile field.  A sternal incision was made as the  saphenous vein was harvested endoscopically from the right leg.  The left  internal mammary artery was harvested as a pedicle  graft from its origin at  the subclavian vessels.  The sternal retractor was placed in the pericardium  was opened and suspended.  Heparin was administered and ACT was documented  as being therapeutic.  Pursestrings were placed in the ascending aorta and  right atrium and the patient was cannulated and placed on bypass and cooled  to 32 degrees.  The coronaries were identified for grafting and the mammary  artery and vein grafts were prepared for the distal anastomoses.  A  cardioplegia cannula was placed in the ascending aorta and into the right  atrium and coronary sinus for both antegrade and retrograde delivery of cold  blood cardioplegia.  A left ventricular vent was placed via the right  superior pulmonary vein.  The patient was cooled to 30  degrees.  The aortic  crossclamp was applied.  Eight hundred milliliters of cold blood  cardioplegia were delivered in split doses between the antegrade aortic and  retrograde coronary sinus catheters.  There was good cardioplegic arrest and  septal temperature dropped to less than 14 degrees.  Topical iced saline was  used to augment myocardial preservation and a pericardial insulator pad was  used to protect the left phrenic nerve.   The distal coronary anastomoses were performed.  The first distal  anastomosis was to the distal right coronary. This was a 1.5-mm vessel with  total proximal occlusion.  A reversed saphenous vein was sewn end-to-side  with a running 7-0 Prolene and there was good flow through graft.  The  second distal anastomosis was to the ramus intermediate.  This a 1.5-mm  vessel with a proximal 80% stenosis.  A reversed saphenous vein was sewn end-  to-side with running 7-0 Prolene with good flow through graft.  The third  distal anastomosis was the obtuse marginal branch of the circumflex.  This  was a 1.5-mm vessel and had a proximal  80% stenosis.  An end-to-side  anastomosis with the saphenous vein was sewn end-to-side with running a 7-0  Prolene and there was good flow through graft.  Cardioplegia was re-dosed.  The fourth distal anastomosis was the distal LAD.  This was a 1.5-mm vessel  with a proximal 80% stenosis.  The left IMA pedicle was brought through an  opening created in the left lateral pericardium and was brought down onto  the LAD and sewn end-to-side with running 8-0 Prolene.  There was excellent  flow through the anastomosis after briefly releasing the mammary bulldog  pedicle clamp and then reapplying the bulldog.  The pedicle was secured to  the epicardium with two 6-0 Prolene sutures. Cardioplegia was re-dosed.   While the crossclamp was still in place, 3 proximal vein anastomoses were placed on the ascending aorta using a 4.0-mm punch with  running 6-0 Prolene.  Air was removed from the coronaries and the left side of the heart using a  dose of retrograde warm blood cardioplegia and the usual de-airing maneuvers  on bypass.  The proximal anastomoses were tied and the crossclamp was  removed.   The heart resumed a spontaneous rhythm.  Air was aspirated from the vein  grafts with a 27-gauge needle.  The vein grafts were then opened and  perfused and had good flow and hemostasis was documented in the proximal and  distal sites.  The mammary artery had good flow with the hemostasis at the  distal anastomosis.  The patient was rewarmed and reperfused.  Temporary  pacing wires were applied.  The lungs  re-expanded and the ventilator was  resumed.  The patient was weaned from bypass, A-V sequentially paced on low-  dose dopamine was stable blood pressure and good hemodynamics.  Protamine  was administered without adverse reaction.  The cannulas were removed.  The  mediastinum was irrigated with warm antibiotic irrigation.  The leg incision  was irrigated and closed in a standard fashion.  The superior pericardial  fat was closed over the aorta and vein grafts.  Two mediastinal and a left  pleural chest tube were placed and brought out through separate incisions.  The sternum was closed with interrupted steel  wire.  The pectoralis fascia was closed in a running #1 Vicryl.  Subcutaneous and skin layers were closed with running Vicryl and sterile  dressings were applied.  Total bypass time was 125 minutes with crossclamp  time of 85 minutes.      PV/MEDQ  D:  05/21/2004  T:  05/22/2004  Job:  [id number]   cc:   Lyn Records III, M.D.  [address]  [address]  [place]  [place] [address]  Fax: [phone]   CVTS Office

## 2010-07-27 NOTE — Cardiovascular Report (Signed)
NAME:  Ethan Lawrence, Ethan Lawrence NO.:  [id number]   MEDICAL RECORD NO.:  [id number]          PATIENT TYPE:  OIB   LOCATION:  6501                         FACILITY:  [facility]   PHYSICIAN:  Lyn Records III, M.D.DATE OF BIRTH:  [birth year]   DATE OF PROCEDURE:  04/17/2004  DATE OF DISCHARGE:                              CARDIAC CATHETERIZATION   INDICATIONS:  The patient recently developed atrial fibrillation and  exertional jaw discomfort. Cardiolite study demonstrated possible inferior  ischemia and this study is being done to define coronary anatomy.   PROCEDURE PERFORMED:  1.  Left heart catheterization.  2.  Selective coronary angiogram.  3.  Left ventriculography.   DESCRIPTION:  After informed consent, a 4-French sheath was placed in the  right femoral artery using modified Seldinger technique. A 4-French A2  multipurpose catheter was then used for hemodynamic recordings, left  ventriculography by hand injection, and selective left and right coronary  angiography. #4 left Judkins catheter was also used for left coronary  angiography. The patient tolerated the procedure without complications.  Total of 95 cc of intra-arterial contrast was administered to perform the  procedure. No complications occurred.   RESULTS:  1.  Hemodynamic data: Aortic pressure 146/77. Left ventricular pressure      149/14.  2.  Left ventriculography: Heavy mitral annular calcification is noted.      Overall LV function is normal. EF 70%.  3.  Coronary angiography:      1.  Left main coronary: Left main coronary artery contains proximal 40%          and distal 40% narrowing.      2.  Left anterior descending coronary: The LAD is a large vessel that          reaches the left ventricular apex. There is 30-40% ostial LAD          narrowing, 50% to 60% narrowing after the first septal perforator          and the midvessel, and an eccentric 75% stenosis of the mid LAD as          well as a 90%  apical stenosis. Other regions of multifocal narrowing          are noted but are felt to be less than 50%.      3.  Ramus intermedius branch: The ramus intermedius is a large vessel          and contains a proximal eccentric 90% stenosis. This does not appear          to involve the ostium of the left main although this was never          plainly demonstrated. Multiple luminal irregularities were noted          beyond this point with no high-grade obstruction.      4.  The circumflex artery: Circumflex coronary artery contains          multifocal irregularities. There is eccentric narrowing in the          proximal LAD before the first obtuse marginal, but obstructs artery  in some views by up to 75%, in other views this appears to be mild          luminal irregularities. First obtuse marginal was free of any          significant obstruction and is relatively large. The continuation of          circumflex beyond the first obtuse marginal contains multiple          luminal irregularities with areas of up to 60% narrowing.      5.  Right coronary: The right coronary artery was a large vessel that          contains diffuse disease throughout its entire mid segment with up          to 60 to 70% narrowing. There is distal 99+% stenosis with          competitive flow beyond this point. The PDA is moderate in size and          is supplied by left-to-right collaterals.   CONCLUSION:  1.  Moderately severe triple-vessel coronary disease with essentially total      occlusion of the distal right coronary with left-to-right collateral,      severe diffuse mid RCA disease, moderate to moderately severe mid LAD      disease, severe proximal ramus intermedius disease, and moderate      proximal and mid circumflex disease.  2.  Normal LV function.   PLAN:  Consider culprit lesion angioplasty on the distal right coronary.  This would be a slight increase in risk due to the proximal vessel and  mid  vessel tortuosity and essentially total occlusion of the distal segment, as  well as possible ramus intermedius stenting. There are too many focal  lesions in the LAD to consider LAD PCI at this time. Other treatment options  would be coronary artery bypass grafting of the culprit vessel, the RCA as  well as grafting the LAD, ramus and obtuse marginal. Surgical options need  to be discussed with the patient and will have a surgical referral. His  risks will be increased because of his age, low-dose steroid dependency due  to polymyalgia, and history of chronic renal insufficiency.      HWS/MEDQ  D:  04/17/2004  T:  04/17/2004  Job:  [id number]   cc:   Duwayne Heck L. Adonis Housekeeper, M.D.  [address]  [place]  [place] [address]  Fax: [phone]   Larena Glassman, M.D.  [facility]  [place], [place]   CVTS

## 2010-07-27 NOTE — Discharge Summary (Signed)
NAME:  Ethan Lawrence, Ethan Lawrence NO.:  [id number]   MEDICAL RECORD NO.:  [id number]          PATIENT TYPE:  INP   LOCATION:  2027                         FACILITY:  [facility]   PHYSICIAN:  Kerin Perna, M.D.  DATE OF BIRTH:  August 30, 1922   DATE OF ADMISSION:  05/21/2004  DATE OF DISCHARGE:                                 DISCHARGE SUMMARY   PRIMARY DISCHARGE DIAGNOSIS:  Coronary artery disease.   SECONDARY DIAGNOSES:  1.  Hypertension.  2.  History of congestive heart failure.  3.  Chronic renal insufficiency with creatinine of 1.5.  4.  Paroxysmal atrial fibrillation.  5.  Degenerative arthritis and polymyalgia rheumatica.   OPERATIONS AND PROCEDURES:  Coronary artery bypass grafting x4 with a left  internal mammary artery to left anterior descending, saphenous vein graft to  ramus intermedius, saphenous vein graft to circumflex marginal, saphenous  vein graft to right coronary artery.   ALLERGIES:  No known drug allergies.   HISTORY AND PHYSICAL/ HOSPITAL COURSE:  Ethan Lawrence is an 77 year old  gentleman who developed an episode of atrial fibrillation with congestive  heart failure requiring hospitalization in late December of 2005.  This was  in [place], under the care of Dr. Larena Glassman.  He subsequently was  elevated by Dr. Katrinka Blazing at [facility], and a stress Cardiolite was  performed in January.  This demonstrated an ejection fraction of 70% with  moderate inferior defect felt to be mildly abnormal Cardiolite.  He  subsequently underwent cardiac catheterization on April 17, 2004.  This  demonstrated three-vessel coronary disease with a 99% stenosis of the right  coronary, 70% stenosis of the mid LAD, 80% stenosis of the ramus  intermedius, and 70% stenosis of the circumflex.  His ejection fraction was  normal, and he had a 1+ mitral regurgitation.  Left ventricular and  diastolic pressure is 14 mmHg. The patient was felt to be a potential  candidate for PCA of the culprit right coronary lesion.  However, surgical  evaluation was requested, and multivessel bypass grafting was felt to be the  best long-term option if comorbid medical conditions, advanced age, chronic  prednisone and mild renal insufficiency would not result in excessive risk  for surgery.  The patient was started on Coumadin for the atrial  fibrillation as well as sotalol was discussed and rate control.  We were  then consulted to evaluate the patient for possible coronary artery bypass  graft.   FOR DETAILS OF THE [name] PAST MEDICAL HISTORY AND PHYSICAL EXAM:  Please  see dictated history and physical.   HOSPITAL COURSE:  On 05/21/04, Ethan Lawrence underwent coronary artery  bypass graft x4 with a left internal mammary artery to the left anterior  descending, saphenous vein graft to ramus intermedius, saphenous vein graft  to circumflex marginal, and saphenous vein graft to right coronary artery.  The patient tolerated the procedure well, and was transferred out to the  intensive care unit in stable condition.  He was extubated the evening of  surgery and continued to do well.   On postoperative day #1, his lines and chest tubes were discontinued.  The  patient was slightly confused the morning after surgery.  Oxycodone was  discontinued, and the patient became alert and oriented x3.  He was out of  bed and ambulating postoperative day #1.  The patient did have an elevated  white blood cell count, although the patient is chronically on prednisone.  At that time, it was just monitored.  Noted, the patient was afebrile.  There was also no evidence of any infection sites.   Postoperatively, the patient was seen, deemed proximal atrial fibrillation  bouncing back and forth between atrial fibrillation and sinus brady.  Amiodarone drip was started.  He was started back on the sotalol on 05/23/04  as well as restarted on the Coumadin.  IV amiodarone was  discontinued.  The  patient's INR levels were followed throughout his postoperative course, and  Coumadin dose was adjusted appropriately.  As stated above, he was in and  out of atrial fibrillation, but rate controlled on the sotalol.  The patient  will be discharged home on Coumadin and Dr. Colette Ribas prescribed patient is to  follow up with Dr. Colette Ribas in one week to evaluate the patient's INR levels.  Ethan Lawrence postoperatively was ambulating well, appetite improving, and  he was off the oxygen with O2 saturations of 93 and 94%.  Ethan Lawrence  was discharged to home on postoperative day #5 in stable condition.   FOLLOWUP:  A follow-up appointment was made with Dr. Donata Clay for June 15, 2004, at 1:32 p.m.  The patient is to follow up prior to that appointment  with Dr. Katrinka Blazing in two weeks from discharge from the hospital.  He will  obtain a PA and lateral chest x-ray at this appointment, which he will bring  with him to see Dr. Donata Clay.  As mentioned above, the patient's is to  follow up with Dr. Colette Ribas in one week to have INR level evaluated.   DISCHARGE INSTRUCTIONS:  Ethan Lawrence received instructions on diet,  activity level and incisional care.  He is told no driving until released to  do so, no heavy lifting over 10 pounds, ambulate three to four times daily,  and continue his breathing treatments.  The patient is also told to contact  the office if he develops any fevers, episodes of chills, drainage, or  opening from any his incision sites.  The patient acknowledges  understandings.   DISCHARGE MEDICATIONS:  1.  Aspirin 81 mg p.o. daily.  2.  Lipitor 20 mg p.o. q.h.s.  3.  Tylox one tablet p.o. q.4h. p.r.n. pain.  4.  Prednisone 5 mg p.o. daily.  5.  Sotalol 80 mg p.o. b.i.d.  6.  Lasix 40 mg p.o. daily x7 days.  7.  Potassium chloride 20 mEq p.o. daily x7 days.  8.  Coumadin 7.5 mg p.o. q.h.s.  Levels followed by Dr. Colette Ribas. 9.  Celebrex 100 mg p.o. daily.       KMD/MEDQ  D:  05/25/2004  T:  05/26/2004  Job:  [id number]

## 2010-07-27 NOTE — H&P (Signed)
[place]  Patient:    Ethan Lawrence, Ethan Lawrence                MRN: [id number] Adm. Date:  [id number] Attending:  Lyn Records. Iii Dictator:   Anselm Lis, N.P. CC:         Darci Needle, M.D.  Nona Dell, M.D., [place], [place]   History and Physical  DATE OF BIRTH:  Jun 08, 1922  SUBJECTIVE:  Ethan Lawrence is a very pleasant 77 year old male with a history of hypertension, otherwise, no significant cardiac history, who presented to his primary medical doctor in [place] with complaint of severe fatigue.  He presented on November 09, 1999.  At that time he was noted to have elevated heart rate and markedly decreased blood pressure, approximately 70/50.  He was sent home to rest over the long Labor Day holiday with discontinuation of his blood pressure medications (Maxzide and Toprol XL 200 mg).  Patient felt a little stronger over the weekend and reported back to his primary M.D.  Whereas systolic blood pressure was approximately 100, but heart rate was greater than 140 and irregular.  Patient denied problems with chest pain, shortness of breath, nausea, or syncope/near syncopal episodes. Family members drove him to [facility] to be evaluated by his primary cardiology service.  PREVIOUS MEDICAL HISTORY: 1. Remote peptic ulcer disease with subsequent partial gastrectomy in 1970s. 2. Degenerative joint disease/arthritis for which he takes prednisone    (followed by Dr. Phylliss Bob). 3. History of hypertension. 4. History of depression recently started on Prozac 20 mg a day. 5. History of bilateral rotator cuff repair with left done June 2000 and    right done this year in April. 6. History of cholecystectomy, TURP x 2, bilateral hernia repair. 7. History of restless leg syndrome, quinine with improvement.  ALLERGIES:  No known drug allergies.  MEDICATIONS: 1. Celebrex q.d. (discontinued five days ago as he was feeling  okay on the    prednisone). 2. Prozac 20 mg recently started about five days earlier. 3. Prednisone 5 mg p.o. q.d. 4. Toprol XL 200 mg a day which was discontinued by primary M.D. five days    earlier. 5. Maxzide one p.o. q.d. discontinued five days earlier. 6. Quinine one p.o. q.h.s. last few months.  SOCIAL HISTORY/HABITS:  Patient has been married for 54 years.  He has one son who died at age 57 in a motorcycle accident.  He has a daughter who is alive and well.  Tobacco:  Quit 15-18 years ago, prior 40 pack-year history.  ETOH: Negative.  Caffeine:  Not excessive.  Patient continues to work in his own [employer].  FAMILY HISTORY:  Mother died at age 12, had element of Alzheimers at the time of her death.  Dad died at age 64 of emphysema.  He has one brother who died of Alzheimers at age 38, also of laryngeal cancer.  Another brother, age 78, who had prior bypass surgery and "light chain disease."  Sister died at age 49s of suicide.  REVIEW OF SYSTEMS:  As in HPI/past medical history, otherwise, denies problems with light-headedness, dizziness, syncope, or near syncopal episodes. Negative for dysphagia to clear fluid.  Negative melena or bright red blood PR.  Negative constipation or diarrhea.  Has noted some weakness in his stream which he thought maybe had been related to Celebrex, so he discontinued it five days earlier.  He did not  really notice any change with urination, though.  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 Episodic PND which is somewhat infrequent.  Denies problems with chest pain. Arthritic type complaints affecting neck, bilateral wrists, right greater than left, knees.  Greatly improved with prednisone PRP.  He does suffer from chronic insomnia from long history of interrupted sleep.  Problems with restless leg syndrome, for which he takes  quinine.  PHYSICAL EXAMINATION:  VITAL SIGNS:  Blood pressure is 118/70, heart rate 135 and irregular, respiratory rate 18.  Patient is afebrile.  GENERAL:  In general he is a 77 year old man in no acute distress.  Alert and pleasantly conversant.  HEENT:  _________ without bruit, significant JVD, or thyromegaly.  CHEST:  Lung sounds clear with equal bilateral excursion.  CARDIAC:  Irregular, tachy.  No murmur.  ABDOMEN:  Soft, nondistended, normal active bowel sounds.  Negative abdominal aortic, _________ bruit.  No masses, no organomegaly appreciated.  EXTREMITIES:  Good distal pulses; no pedal edema.  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  Inferior Q-waves were old by comparison to office EKG. Initial troponin Is elevated at 0.17.  CK-MB fractions are negative.  WBC is 6.6 with hemoglobin at 12.3, hematocrit at 34.3, platelets of 287. Chemistry profile revealed sodium 133, K 4.0, chloride of 104, CO2 21, glucose 104, BUN 34, creatinine 2.3.  First CK 32 with MB fraction 1.1.  Troponin I elevated at 0.17.  TSH within normal range at 1.022.  Chest x-ray revealed no active disease; chronic findings including chronic calcified granulomas and some areas of scarring.  Heart at upper limits of normal size.  IMPRESSION: 1. Recent onset of atrial fibrillation with rapid ventricular response,    exacerbated by abrupt discontinuation of beta blocker. 2. History of hypertension. 3. Negative office treadmill in 1997 (negative for ischemia). 4. Renal insufficiency with creatinine of 2.3.  PLAN: 1. Admit to telemetry, rule out MI with serial cardiac enzymes, daily    EKG.  Will administer IV Lopressor and Diltiazem for rate control. 2. Subcu Lovenox. 3. Will obtain 2-D echocardiogram to assess for structural or valvular cardiac     disease as etiology for atrial  fibrillation. 4. Will probably need to repeat evaluation for coronary artery disease. DD:  11/14/99 TD:  11/14/99 Job: [id number]

## 2010-07-30 ENCOUNTER — Ambulatory Visit
Admission: RE | Admit: 2010-07-30 | Discharge: 2010-07-30 | Disposition: A | Discharge status: Home / Self Care | Payer: Medicare PPO | Source: Ambulatory Visit | Attending: Surgery | Admitting: Surgery

## 2010-07-30 ENCOUNTER — Ambulatory Visit (INDEPENDENT_AMBULATORY_CARE_PROVIDER_SITE_OTHER): Payer: Medicare PPO | Admitting: Surgery

## 2010-07-30 DIAGNOSIS — I714 Abdominal aortic aneurysm, without rupture, unspecified: Secondary | ICD-10-CM

## 2010-07-30 MED ORDER — IOHEXOL 350 MG/ML SOLN
75.0000 mL | Freq: Once | INTRAVENOUS | Status: AC | PRN
  Administered 2010-07-30: 75 mL via INTRAVENOUS

## 2010-07-31 NOTE — Assessment & Plan Note (Signed)
OFFICE VISIT  [name], Ethan Lawrence DOB:  [birth year]                                       07/30/2010 JYNWG#:[id number]  The patient comes back today for followup.  He is status post percutaneous endovascular aneurysm repair on 07/05/2010 with a Gore excluder device.  The patient's postoperative course was uncomplicated. He has undergone back surgery by Dr. Dutch Quint approximately 1 week ago.  He has had no discomfort from his aneurysm repair.  CT angiogram was performed today which shows that the aneurysm has decreased in size.  There is a question of a small endoleak.  The aneurysm sac measures 5.5.  It was previously 5.8.  On examination his groin wounds are well-healed.  Extremities are warm, well-perfused.  I discussed with the patient and his daughter this finding of possible endoleak on scan.  We discussed the significance of this.  I will plan on seeing him back in 3 months with a repeat CT scan.  Hopefully this will resolve spontaneously and will not require any intervention.    Ethan Ny, MD Electronically Signed  VWB/MEDQ  D:  07/30/2010  T:  07/31/2010  Job:  [id number]  cc:   Kerin Perna, M.D. Dr Larena Glassman

## 2010-08-03 ENCOUNTER — Other Ambulatory Visit: Payer: Self-pay | Admitting: Surgery

## 2010-08-03 DIAGNOSIS — I714 Abdominal aortic aneurysm, without rupture: Secondary | ICD-10-CM

## 2010-10-04 ENCOUNTER — Encounter: Payer: Self-pay | Admitting: *Deleted

## 2010-10-29 ENCOUNTER — Ambulatory Visit
Admission: RE | Admit: 2010-10-29 | Discharge: 2010-10-29 | Disposition: A | Discharge status: Home / Self Care | Payer: Medicare PPO | Source: Ambulatory Visit | Attending: Surgery | Admitting: Surgery

## 2010-10-29 ENCOUNTER — Ambulatory Visit (INDEPENDENT_AMBULATORY_CARE_PROVIDER_SITE_OTHER): Payer: Medicare PPO | Admitting: Surgery

## 2010-10-29 ENCOUNTER — Encounter: Payer: Self-pay | Admitting: Surgery

## 2010-10-29 VITALS — BP 122/78 | HR 58 | Ht 70.0 in | Wt 180.0 lb

## 2010-10-29 DIAGNOSIS — I714 Abdominal aortic aneurysm, without rupture: Secondary | ICD-10-CM

## 2010-10-29 NOTE — Progress Notes (Signed)
Subjective:     Patient ID: Ethan Lawrence, male   DOB: [birth year], 77 y.o.   MRN: [id number]  HPI The patient comes back today for followup of his endovascular aneurysm repair. This was done percutaneously on 07/05/2010 using a Gore Excluder device. The patient's postoperative course was uncomplicated. His initial CT scan showed that the aneurysm had decreased in size from 5.8-5.5 cm. There was question of a small type II endoleak. The patient remains asymptomatic without abdominal pain.  Review of Systems Past Medical History  Diagnosis Date  . Hypertension   . Hyperlipidemia   . CAD (coronary artery disease)   . Atrial fib/flutter, transient     History  Substance Use Topics  . Smoking status: Former Smoker    Quit date: 10/29/1975  . Smokeless tobacco: Not on file  . Alcohol Use: No    Family History  Problem Relation Age of Onset  . Coronary artery disease Brother     No Known Allergies  Current outpatient prescriptions:CELEBREX 200 MG capsule, , Disp: , Rfl: ;  celecoxib (CELEBREX) 100 MG capsule, Take 100 mg by mouth daily.  , Disp: , Rfl: ;  ferrous sulfate 325 (65 FE) MG tablet, , Disp: , Rfl: ;  folic acid (FOLVITE) 1 MG tablet, Take 1 mg by mouth daily.  , Disp: , Rfl: ;  HYDROcodone-acetaminophen (LORTAB) 10-500 MG per tablet, , Disp: , Rfl:  nefazodone (SERZONE) 150 MG tablet, Take 150 mg by mouth 2 (two) times daily.  , Disp: , Rfl: ;  predniSONE (DELTASONE) 10 MG tablet pack, , Disp: , Rfl: ;  predniSONE (DELTASONE) 5 MG tablet, Take 5 mg by mouth daily.  , Disp: , Rfl: ;  simvastatin (ZOCOR) 20 MG tablet, Take 20 mg by mouth at bedtime.  , Disp: , Rfl: ;  triamterene-hydrochlorothiazide (MAXZIDE-25) 37.5-25 MG per tablet, Take 1 tablet by mouth daily.  , Disp: , Rfl:  Vitamin D, Ergocalciferol, (DRISDOL) 50000 UNITS CAPS, , Disp: , Rfl: ;  warfarin (COUMADIN) 5 MG tablet, Take 5 mg by mouth as directed.  , Disp: , Rfl:   Filed Vitals:   10/29/10 1222  Height:  5\' 10"  (1.778 m)  Weight: 180 lb (81.647 kg)    Body mass index is 25.83 kg/(m^2).         review of systems: Negative except as documented in the history of present illness. Objective:   Physical Exam General: Well-appearing in no acute distress  Cardiovascular: Regular rate. Palpable femoral pulses  Abdomen: Soft and nontender, no pulsatile mass  Extremities are warm and well perfused       Diagnostic studies: CT angiogram today had to be done without contrast because of a creatinine of 1.9. Based on review of the images the aneurysm may have slightly decreased in size from 5.5-5.4 cm.   Assessment:    abdominal aortic aneurysm status post percutaneous and a vascular repair    Plan:     The patient is doing very well from his operation. I could not get a good evaluation of his possible endoleak today because they could not give him IV contrast. Regardless, his aneurysm has slightly decreased in size and therefore think we can continue to follow this. I am going to see her for him to the nephrologist for evaluation of elevated creatinine. I would like to repeat a CT scan in 6 months. Hopefully at that time his cranial decrease her that we can get a study with IV contrast.

## 2011-06-15 ENCOUNTER — Inpatient Hospital Stay (HOSPITAL_BASED_OUTPATIENT_CLINIC_OR_DEPARTMENT_OTHER)
Admission: EM | Admit: 2011-06-15 | Discharge: 2011-06-20 | DRG: 812 | Disposition: A | Discharge status: Home / Self Care | Payer: Medicare PPO | Attending: Internal Medicine | Admitting: Internal Medicine

## 2011-06-15 ENCOUNTER — Emergency Department (INDEPENDENT_AMBULATORY_CARE_PROVIDER_SITE_OTHER): Payer: Medicare PPO

## 2011-06-15 ENCOUNTER — Encounter (HOSPITAL_BASED_OUTPATIENT_CLINIC_OR_DEPARTMENT_OTHER): Payer: Self-pay | Admitting: *Deleted

## 2011-06-15 DIAGNOSIS — M549 Dorsalgia, unspecified: Secondary | ICD-10-CM | POA: Diagnosis present

## 2011-06-15 DIAGNOSIS — I2510 Atherosclerotic heart disease of native coronary artery without angina pectoris: Secondary | ICD-10-CM | POA: Diagnosis present

## 2011-06-15 DIAGNOSIS — M129 Arthropathy, unspecified: Secondary | ICD-10-CM | POA: Diagnosis present

## 2011-06-15 DIAGNOSIS — K625 Hemorrhage of anus and rectum: Secondary | ICD-10-CM

## 2011-06-15 DIAGNOSIS — Z951 Presence of aortocoronary bypass graft: Secondary | ICD-10-CM

## 2011-06-15 DIAGNOSIS — I10 Essential (primary) hypertension: Secondary | ICD-10-CM

## 2011-06-15 DIAGNOSIS — D649 Anemia, unspecified: Secondary | ICD-10-CM

## 2011-06-15 DIAGNOSIS — R252 Cramp and spasm: Secondary | ICD-10-CM

## 2011-06-15 DIAGNOSIS — Z7901 Long term (current) use of anticoagulants: Secondary | ICD-10-CM

## 2011-06-15 DIAGNOSIS — N289 Disorder of kidney and ureter, unspecified: Secondary | ICD-10-CM

## 2011-06-15 DIAGNOSIS — I4891 Unspecified atrial fibrillation: Secondary | ICD-10-CM | POA: Diagnosis present

## 2011-06-15 DIAGNOSIS — K5730 Diverticulosis of large intestine without perforation or abscess without bleeding: Secondary | ICD-10-CM | POA: Diagnosis present

## 2011-06-15 DIAGNOSIS — K922 Gastrointestinal hemorrhage, unspecified: Secondary | ICD-10-CM | POA: Diagnosis present

## 2011-06-15 DIAGNOSIS — Z9889 Other specified postprocedural states: Secondary | ICD-10-CM

## 2011-06-15 DIAGNOSIS — M79606 Pain in leg, unspecified: Secondary | ICD-10-CM

## 2011-06-15 DIAGNOSIS — M1990 Unspecified osteoarthritis, unspecified site: Secondary | ICD-10-CM | POA: Diagnosis present

## 2011-06-15 DIAGNOSIS — K648 Other hemorrhoids: Secondary | ICD-10-CM | POA: Diagnosis present

## 2011-06-15 DIAGNOSIS — I714 Abdominal aortic aneurysm, without rupture: Secondary | ICD-10-CM

## 2011-06-15 DIAGNOSIS — G8929 Other chronic pain: Secondary | ICD-10-CM | POA: Diagnosis present

## 2011-06-15 DIAGNOSIS — I482 Chronic atrial fibrillation, unspecified: Secondary | ICD-10-CM | POA: Diagnosis present

## 2011-06-15 DIAGNOSIS — I252 Old myocardial infarction: Secondary | ICD-10-CM

## 2011-06-15 LAB — DIFFERENTIAL
Basophils Relative: 0 % (ref 0–1)
Eosinophils Relative: 1 % (ref 0–5)
Lymphocytes Relative: 13 % (ref 12–46)
Monocytes Relative: 11 % (ref 3–12)
Neutrophils Relative %: 75 % (ref 43–77)

## 2011-06-15 LAB — BASIC METABOLIC PANEL
BUN: 58 mg/dL — ABNORMAL HIGH (ref 6–23)
CO2: 24 mEq/L (ref 19–32)
Chloride: 102 mEq/L (ref 96–112)
GFR calc Af Amer: 25 mL/min — ABNORMAL LOW (ref >90)
Potassium: 4.7 mEq/L (ref 3.5–5.1)

## 2011-06-15 LAB — PROTIME-INR: Prothrombin Time: 25.6 seconds — ABNORMAL HIGH (ref 11.6–15.2)

## 2011-06-15 LAB — CBC
Hemoglobin: 6.4 g/dL — CL (ref 13.0–17.0)
Platelets: 211 10*3/uL (ref 150–400)
RBC: 2.32 MIL/uL — ABNORMAL LOW (ref 4.22–5.81)
WBC: 5.8 10*3/uL (ref 4.0–10.5)

## 2011-06-15 MED ORDER — VITAMIN K1 10 MG/ML IJ SOLN
10.0000 mg | Freq: Once | INTRAVENOUS | Status: AC
  Administered 2011-06-16: 10 mg via INTRAVENOUS
  Filled 2011-06-15: qty 1

## 2011-06-15 MED ORDER — SODIUM CHLORIDE 0.9 % IV BOLUS (SEPSIS)
500.0000 mL | Freq: Once | INTRAVENOUS | Status: AC
  Administered 2011-06-16: 500 mL via INTRAVENOUS

## 2011-06-15 NOTE — ED Notes (Signed)
MD at bedside. 

## 2011-06-15 NOTE — ED Notes (Signed)
Family states pt has been c/o leg and hand cramps for about 3 weeks. Over past few days has gotten worse. Given Valium and he has been taking them. Now unable to sit still.

## 2011-06-15 NOTE — ED Notes (Signed)
Family at bedside. 

## 2011-06-15 NOTE — ED Provider Notes (Signed)
History    This chart was scribed for Hilario Quarry, MD, MD by Smitty Pluck. The patient was seen in room [facility] and the patient's care was started at 10:19PM.  CSN: [id number]  Arrival date & time 06/15/11  2153   First MD Initiated Contact with Patient 06/15/11 2216      Chief Complaint  Patient presents with  . Leg Pain    (Consider location/radiation/quality/duration/timing/severity/associated sxs/prior treatment) Patient is a 78 y.o. male presenting with leg pain. The history is provided by the patient.  Leg Pain    Ethan Lawrence is a 78 y.o. male who presents to the Emergency Department complaining of moderate cramps bilaterally in legs onset 4 days ago. Pt has aneurism that has been bypassed. He has had surgery on his back and after last surgery he had pain come back immediately but he took hydrocodone every day. Pt denies dysuria. Pt was given valium (5 tablets) 1 day ago and has taken them without relief. The pain has been constant since onset. He has been on prednisone for the last 15 years. Denies loss of sensation and numbness. Pt is currently taking lasix. Pt says he gets his blood checked weekly and it was 2.1.  PCP in [place].   Past Medical History  Diagnosis Date  . Hypertension   . Hyperlipidemia   . CAD (coronary artery disease)   . Atrial fib/flutter, transient     Past Surgical History  Procedure Date  . Endovascular stent insertion 07/05/2010  . Appendectomy     Family History  Problem Relation Age of Onset  . Coronary artery disease Brother     History  Substance Use Topics  . Smoking status: Former Smoker    Quit date: 10/29/1975  . Smokeless tobacco: Not on file  . Alcohol Use: No      Review of Systems  All other systems reviewed and are negative.  10 Systems reviewed and all are negative for acute change except as noted in the HPI.    Allergies  Review of patient's allergies indicates no known allergies.  Home  Medications   Current Outpatient Rx  Name Route Sig Dispense Refill  . CELEBREX 200 MG PO CAPS      . CELECOXIB 100 MG PO CAPS Oral Take 100 mg by mouth daily.      Marland Kitchen FERROUS SULFATE 325 (65 FE) MG PO TABS      . FOLIC ACID 1 MG PO TABS Oral Take 1 mg by mouth daily.      Marland Kitchen HYDROCODONE-ACETAMINOPHEN 10-500 MG PO TABS      . NEFAZODONE HCL 150 MG PO TABS Oral Take 150 mg by mouth 2 (two) times daily.      Marland Kitchen PREDNISONE (PAK) 10 MG PO TABS      . PREDNISONE 5 MG PO TABS Oral Take 5 mg by mouth daily.      Marland Kitchen SIMVASTATIN 20 MG PO TABS Oral Take 20 mg by mouth at bedtime.      . TRIAMTERENE-HCTZ 37.5-25 MG PO TABS Oral Take 1 tablet by mouth daily.      Marland Kitchen VITAMIN D (ERGOCALCIFEROL) 50000 UNITS PO CAPS      . WARFARIN SODIUM 5 MG PO TABS Oral Take 5 mg by mouth as directed.        BP 110/60  Pulse 88  Temp(Src) 98.2 F (36.8 C) (Oral)  Resp 24  Ht 6' (1.829 m)  Wt 185 lb (83.915 kg)  BMI 25.09 kg/m2  SpO2 97%  Physical Exam  Nursing note and vitals reviewed. Constitutional: He is oriented to person, place, and time. He appears well-developed and well-nourished. No distress.  HENT:  Head: Normocephalic and atraumatic.  Eyes: EOM are normal. Pupils are equal, round, and reactive to light.  Neck: Normal range of motion. Neck supple.  Cardiovascular: Normal rate, regular rhythm and normal heart sounds.   Pulmonary/Chest: Effort normal and breath sounds normal. No respiratory distress.  Musculoskeletal: Normal range of motion.  Neurological: He is alert and oriented to person, place, and time.  Skin: Skin is warm and dry.  Psychiatric: He has a normal mood and affect. His behavior is normal.    ED Course  Procedures (including critical care time) DIAGNOSTIC STUDIES: Oxygen Saturation is 97% on room air, normal by my interpretation.    COORDINATION OF CARE: 10:26PM EDP discusses pt ED treatment course with pt.    Labs Reviewed - No data to display No results found.   No  diagnosis found. Results for orders placed during the hospital encounter of 06/15/11  BASIC METABOLIC PANEL      Component Value Range   Sodium 137  135 - 145 (mEq/L)   Potassium 4.7  3.5 - 5.1 (mEq/L)   Chloride 102  96 - 112 (mEq/L)   CO2 24  19 - 32 (mEq/L)   Glucose, Bld 111 (*) 70 - 99 (mg/dL)   BUN 58 (*) 6 - 23 (mg/dL)   Creatinine, Ser 1.61 (*) 0.50 - 1.35 (mg/dL)   Calcium 8.9  8.4 - 09.6 (mg/dL)   GFR calc non Af Amer 22 (*) >90 (mL/min)   GFR calc Af Amer 25 (*) >90 (mL/min)  PROTIME-INR      Component Value Range   Prothrombin Time 25.6 (*) 11.6 - 15.2 (seconds)   INR 2.29 (*) 0.00 - 1.49   CBC      Component Value Range   WBC 5.8  4.0 - 10.5 (K/uL)   RBC 2.32 (*) 4.22 - 5.81 (MIL/uL)   Hemoglobin 6.4 (*) 13.0 - 17.0 (g/dL)   HCT 04.5 (*) 40.9 - 52.0 (%)   MCV 86.2  78.0 - 100.0 (fL)   MCH 27.2  26.0 - 34.0 (pg)   MCHC 31.5  30.0 - 36.0 (g/dL)   RDW 81.1  91.4 - 78.2 (%)   Platelets 211  150 - 400 (K/uL)  DIFFERENTIAL      Component Value Range   Neutrophils Relative 75  43 - 77 (%)   Lymphocytes Relative 13  12 - 46 (%)   Monocytes Relative 11  3 - 12 (%)   Eosinophils Relative 1  0 - 5 (%)   Basophils Relative 0  0 - 1 (%)   Neutro Abs 4.3  1.7 - 7.7 (K/uL)   Lymphs Abs 0.8  0.7 - 4.0 (K/uL)   Monocytes Absolute 0.6  0.1 - 1.0 (K/uL)   Eosinophils Absolute 0.1  0.0 - 0.7 (K/uL)   Basophils Absolute 0.0  0.0 - 0.1 (K/uL)   RBC Morphology POLYCHROMASIA PRESENT     Smear Review LARGE PLATELETS PRESENT    TYPE AND SCREEN      Component Value Range   ABO/RH(D) PENDING     Antibody Screen PENDING     Sample Expiration 06/18/2011     Unit Number [id number]     Blood Component Type RED CELLS,LR     Unit division 00     Status of  Unit ISSUED     Unit tag comment VERBAL ORDERS PER DR Shigeko Manard     Transfusion Status OK TO TRANSFUSE     Crossmatch Result PENDING     Unit Number [id number]     Blood Component Type RED CELLS,LR     Unit division 00     Status of Unit  ISSUED     Unit tag comment VERBAL ORDERS PER DR Stryder Poitra     Transfusion Status OK TO TRANSFUSE     Crossmatch Result PENDING     I personally performed the services described in this documentation, which was scribed in my presence. The recorded information has been reviewed and considered.    MDM  Patient presents with severe bilateral lower extremity pain. He has a history of severe lumbar pain with multiple surgeries and abdominal aortic aneurysm. CT is pending. The patient has a hemoglobin of 6. He does not report any recent rectal bleeding. He complains of no abdominal pain. He is on Coumadin. Patient is given vitamin K 10 mg IV. He is being transfused 2 units of O- blood. FFP is not available at this institution.  Patient has new onset of renal insufficiency. He is given IV fluids 500 cc of normal saline. He is not given contrast on his CT secondary to this.  [name] care is discussed with Dr. Luisa Hart of critical care. There are no ICU beds available at [facility] I have also discussed his care with Dr.Molpus who is in the emergency department at [facility]. He is aware of his pending presents in the emergency department.  CT results discussed with Dr. Rito Ehrlich on. There is no increase in the abdominal aortic aneurysm in fact it has decreased in size somewhat since the stents have been placed. There is no evidence of blood in the abdomen and back appear stable. 12:04 AM   CRITICAL CARE Performed by: Hilario Quarry   Total critical care time: 75  Critical care time was exclusive of separately billable procedures and treating other patients.  Critical care was necessary to treat or prevent imminent or life-threatening deterioration.  Critical care was time spent personally by me on the following activities: development of treatment plan with patient and/or surrogate as well as nursing, discussions with consultants, evaluation of patient's response to treatment, examination of  patient, obtaining history from patient or surrogate, ordering and performing treatments and interventions, ordering and review of laboratory studies, ordering and review of radiographic studies, pulse oximetry and re-evaluation of patient's condition.   Hilario Quarry, MD 06/16/11 0005

## 2011-06-16 ENCOUNTER — Other Ambulatory Visit: Payer: Self-pay

## 2011-06-16 ENCOUNTER — Encounter (HOSPITAL_COMMUNITY): Payer: Self-pay | Admitting: Emergency Medicine

## 2011-06-16 DIAGNOSIS — N179 Acute kidney failure, unspecified: Secondary | ICD-10-CM

## 2011-06-16 DIAGNOSIS — K922 Gastrointestinal hemorrhage, unspecified: Secondary | ICD-10-CM

## 2011-06-16 LAB — MRSA PCR SCREENING: MRSA by PCR: NEGATIVE

## 2011-06-16 LAB — CBC
Hemoglobin: 8.5 g/dL — ABNORMAL LOW (ref 13.0–17.0)
MCHC: 32.3 g/dL (ref 30.0–36.0)
RDW: 14.9 % (ref 11.5–15.5)
WBC: 6.9 10*3/uL (ref 4.0–10.5)

## 2011-06-16 LAB — PREPARE RBC (CROSSMATCH)

## 2011-06-16 LAB — BASIC METABOLIC PANEL
Chloride: 105 mEq/L (ref 96–112)
GFR calc Af Amer: 33 mL/min — ABNORMAL LOW (ref >90)
GFR calc non Af Amer: 29 mL/min — ABNORMAL LOW (ref >90)
Potassium: 4 mEq/L (ref 3.5–5.1)
Sodium: 141 mEq/L (ref 135–145)

## 2011-06-16 LAB — HEPATIC FUNCTION PANEL
ALT: 10 U/L (ref 0–53)
AST: 13 U/L (ref 0–37)
Albumin: 3.5 g/dL (ref 3.5–5.2)

## 2011-06-16 LAB — HEMOGLOBIN AND HEMATOCRIT, BLOOD: Hemoglobin: 8.2 g/dL — ABNORMAL LOW (ref 13.0–17.0)

## 2011-06-16 LAB — PROTIME-INR: INR: 1.33 (ref 0.00–1.49)

## 2011-06-16 MED ORDER — ROPINIROLE HCL ER 8 MG PO TB24
8.0000 mg | ORAL_TABLET | Freq: Once | ORAL | Status: DC
  Filled 2011-06-16 (×2): qty 1

## 2011-06-16 MED ORDER — SODIUM CHLORIDE 0.9 % IV SOLN
250.0000 mL | INTRAVENOUS | Status: DC | PRN
  Administered 2011-06-16: 250 mL via INTRAVENOUS
  Administered 2011-06-17: 10 mL/h via INTRAVENOUS
  Administered 2011-06-18: 250 mL via INTRAVENOUS

## 2011-06-16 MED ORDER — ROPINIROLE HCL ER 8 MG PO TB24
8.0000 mg | ORAL_TABLET | Freq: Every day | ORAL | Status: DC
  Filled 2011-06-16: qty 1

## 2011-06-16 MED ORDER — DIAZEPAM 2 MG PO TABS: 2.0000 mg | ORAL_TABLET | Freq: Four times a day (QID) | ORAL | Status: DC | PRN

## 2011-06-16 MED ORDER — GABAPENTIN 300 MG PO CAPS
300.0000 mg | ORAL_CAPSULE | Freq: Three times a day (TID) | ORAL | Status: DC
  Administered 2011-06-16 – 2011-06-17 (×3): 300 mg via ORAL
  Filled 2011-06-16 (×5): qty 1

## 2011-06-16 MED ORDER — MORPHINE SULFATE 2 MG/ML IJ SOLN
2.0000 mg | Freq: Once | INTRAMUSCULAR | Status: AC
  Administered 2011-06-16: 2 mg via INTRAVENOUS
  Filled 2011-06-16: qty 1

## 2011-06-16 MED ORDER — PREDNISONE 5 MG PO TABS
5.0000 mg | ORAL_TABLET | Freq: Every day | ORAL | Status: DC
  Administered 2011-06-16 – 2011-06-20 (×5): 5 mg via ORAL
  Filled 2011-06-16 (×9): qty 1

## 2011-06-16 MED ORDER — OXYCODONE-ACETAMINOPHEN 5-325 MG PO TABS
1.0000 | ORAL_TABLET | ORAL | Status: DC | PRN
  Administered 2011-06-18 – 2011-06-20 (×6): 1 via ORAL
  Filled 2011-06-16 (×6): qty 1

## 2011-06-16 MED ORDER — ALBUTEROL SULFATE HFA 108 (90 BASE) MCG/ACT IN AERS: 2.0000 | INHALATION_SPRAY | Freq: Four times a day (QID) | RESPIRATORY_TRACT | Status: DC | PRN

## 2011-06-16 MED ORDER — TRAMADOL HCL 50 MG PO TABS
100.0000 mg | ORAL_TABLET | Freq: Four times a day (QID) | ORAL | Status: DC | PRN
  Administered 2011-06-16 – 2011-06-19 (×2): 100 mg via ORAL
  Filled 2011-06-16 (×2): qty 2

## 2011-06-16 MED ORDER — SODIUM CHLORIDE 0.9 % IV SOLN
8.0000 mg/h | INTRAVENOUS | Status: DC
  Administered 2011-06-16 – 2011-06-19 (×9): 8 mg/h via INTRAVENOUS
  Filled 2011-06-16 (×22): qty 80

## 2011-06-16 MED ORDER — ACETAMINOPHEN 325 MG PO TABS
650.0000 mg | ORAL_TABLET | Freq: Four times a day (QID) | ORAL | Status: DC | PRN
  Administered 2011-06-17: 650 mg via ORAL
  Filled 2011-06-16: qty 2

## 2011-06-16 MED ORDER — ROPINIROLE HCL 1 MG PO TABS
2.0000 mg | ORAL_TABLET | Freq: Three times a day (TID) | ORAL | Status: DC
  Administered 2011-06-16 – 2011-06-17 (×3): 2 mg via ORAL
  Filled 2011-06-16 (×5): qty 2

## 2011-06-16 NOTE — ED Notes (Signed)
Patient is transfer from [facility] to see PCCM for admission.  Patient is positive for blood in stool, receiving 1st unit of emergency release PRBC's.

## 2011-06-16 NOTE — Consult Note (Signed)
Name: Ethan Lawrence MRN: [id number] DOB: 07/06/1922    LOS: 1  PCCM ADMISSION NOTE  History of Present Illness: Patient is an 78 y/o M with PMHx of HTN, DL, CAD, A fib on chronic anticoag with coumadin, AAA s/p endovascular repair and Hx of peptic ulcer disease s/p ?gastric surgery who was admitted to [facility] due to Hb 6.4.  As per patient and medical records, he has been having bilat leg pain and cramps. He has Hx of chronic back pain radiated to the legs, which has been treated with hydrocodone and recently with diazepam. Pt has Hx of AAA s/p endovasc repair and A fib on anticoag with coumadin. Lab work showed Hb 6.4, creat 2.5, INR 2.29. He has been followed by his PCP in [place] who checks his INR. He reports dark-colored stools but he has been takin iron pills due to low iron level. He denies any fevers, chills, night sweats, hemoptysis, hematochezia, hematemesis, chest pain, urinary or bowel incontinence or syncopal episodes. Of note, pt has been on Celebrex and prednisone 5mg /day due to Hx of arthritis and no PPIs.  Lines / Drains: Peripheral lines  Cultures: None  Antibiotics: None  Past Medical History  Diagnosis Date  . Hypertension   . Hyperlipidemia   . CAD (coronary artery disease)   . Atrial fib/flutter, transient    Past Surgical History  Procedure Date  . Endovascular stent insertion 07/05/2010  . Appendectomy    Prior to Admission medications   Medication Sig Start Date End Date Taking? Authorizing Provider  CELEBREX 200 MG capsule Take 200 mg by mouth daily.  09/28/10  Yes Historical Provider, MD  celecoxib (CELEBREX) 100 MG capsule Take 100 mg by mouth daily.    Yes Historical Provider, MD  diazepam (VALIUM) 10 MG tablet Take 10 mg by mouth 3 (three) times daily.   Yes Historical Provider, MD  ferrous sulfate 325 (65 FE) MG tablet Take 325 mg by mouth daily with breakfast.  10/09/10  Yes Historical Provider, MD  folic acid (FOLVITE) 1 MG tablet Take 1 mg by  mouth daily.    Yes Historical Provider, MD  furosemide (LASIX) 20 MG tablet Take 20 mg by mouth 2 (two) times daily.   Yes Historical Provider, MD  HYDROcodone-acetaminophen (LORTAB) 10-500 MG per tablet Take 1 tablet by mouth every 6 (six) hours as needed. For pain  10/18/10  Yes Historical Provider, MD  predniSONE (DELTASONE) 10 MG tablet pack Take 10 mg by mouth daily.  10/25/10  Yes Historical Provider, MD  predniSONE (DELTASONE) 5 MG tablet Take 5 mg by mouth daily.    Yes Historical Provider, MD  simvastatin (ZOCOR) 20 MG tablet Take 20 mg by mouth at bedtime.    Yes Historical Provider, MD  triamterene-hydrochlorothiazide (MAXZIDE-25) 37.5-25 MG per tablet Take 1 tablet by mouth daily.     Yes Historical Provider, MD  warfarin (COUMADIN) 5 MG tablet Take 5 mg by mouth as directed.    Yes Historical Provider, MD  Vitamin D, Ergocalciferol, (DRISDOL) 50000 UNITS CAPS Take 50,000 Units by mouth every 7 (seven) days.  10/08/10   Historical Provider, MD   Allergies No Known Allergies  Family History Family History  Problem Relation Age of Onset  . Coronary artery disease Brother     Social History  reports that he quit smoking about 35 years ago. He does not have any smokeless tobacco history on file. He reports that he does not drink alcohol or use illicit drugs.  Review Of Systems  All other systems were negative except as above in HPI  Vital Signs: Temp:  [97.5 F (36.4 C)-98.6 F (37 C)] 98.4 F (36.9 C) (04/07 0118) Pulse Rate:  [56-88] 56  (04/07 0145) Resp:  [12-24] 12  (04/07 0145) BP: (94-128)/(60-91) 117/66 mmHg (04/07 0145) SpO2:  [94 %-100 %] 96 % (04/07 0145) Weight:  [83.915 kg (185 lb)] 83.915 kg (185 lb) (04/06 2209)    Physical Examination: General:  Pt is alert, oriented x3, in no acute distress Neuro: No focal deficit HEENT:  PERRL, pale conjunctivae, moist membranes Neck:  Supple, no JVD   Cardiovascular:  Irregular rhythm, S1 and S2 normal Lungs:  Clear  breath sounds bilaterally with no crackles, rhonchi or wheezes. Abdomen:  Soft, nontender, nondistended, bowel sounds present, no peritoneal signs Musculoskeletal: no low extrem edema Skin:  No rash    Labs and Imaging:  Reviewed.  Please refer to the Assessment and Plan section for relevant results.  ASSESSMENT AND PLAN  PULMONARY No results found for this basename: PHART:5,PCO2:5,PCO2ART:5,PO2ART:5,HCO3:5,O2SAT:5 in the last 168 hours A:  Hx of COPD P: -->  CXR with no infiltrates, mild hyperinflation - Pt used to be un Advair but was told he does not need it anymore - No wheezes on PE -->  Albuterol PRN   CARDIOVASCULAR No results found for this basename: TROPONINI:5,LATICACIDVEN:5, O2SATVEN:5,PROBNP:5 in the last 168 hours A:  CAD, afib on anticoag with coumadin and Hx of AAA s/p repair P: -->  BP stable - HB 6.4 - Hold coumadin, PRBC transfusion, FFP, vit K - EKG with RBBB seen on previous EKG and a fib, HR 81 - Follow CBC, PT,INR  RENAL  Lab 06/15/11 2235  NA 137  K 4.7  CL 102  CO2 24  BUN 58*  CREATININE 2.50*  CALCIUM 8.9  MG --  PHOS --   A:  Acute on CKD - Hx of HTN, and pt has been on Celebrex for arthritis. - Hb 6.4 P: - Baseline creat 15-1.6 - Creat 2.5 - Will transfuse PRBCs, FFPs -->  Follow BMP, Mg, Phos in AM - Stop NSAIDs  GASTROINTESTINAL  Lab 06/15/11 2310  AST 13  ALT 10  ALKPHOS 89  BILITOT 0.2*  PROT 6.3  ALBUMIN 3.5   A:  GI bleed P: -->  Hb 6.4, positive occult blood in stool - Hx of melenas - Hx of peptic ulcer disease s/p ?gastric surgery - On chronic tx with NSAIDs for arthritis - No active bleeding at this time - transfuse PRBCs, FFPs and Vita K 10 mg given - Protonix drip - Follow CBC, PT/INR - GI on consult  HEMATOLOGIC  Lab 06/15/11 2235  HGB 6.4*  HCT 20.0*  PLT 211  INR 2.29*  APTT --   A:  GI bleed P: - Transfuse 2 units PRBCs, 2 units FFP, Vit K 10 mg once -->  Follow up CBC,  INR/PT  Endocrine - Pt has been on chronic tx with prednisone at 5 mg/day - BP is stable and will continue 5 mg/day - Low threshold to give stress dose if pt becomes hypotensive.   BEST PRACTICE / DISPOSITION -->  ICU status under PCCM -->  Full code -->  NPO -->  SCDs -->  Protonix drip -->  Family updated at bedside  The patient is critically ill with multiple organ systems failure and requires high complexity decision making for assessment and support, frequent evaluation and titration of therapies, application of  advanced monitoring technologies and extensive interpretation of multiple databases. Critical Care Time devoted to patient care services described in this note is 60 minutes.  Juanda Chance, MD Pulmonary and Critical Care Medicine [facility] [phone]  06/16/2011, 2:38 AM

## 2011-06-16 NOTE — Consult Note (Signed)
Referring Provider: Dr. Orbie Hurst Primary Care Physician:  Mikey Bussing, MD, MD Primary Gastroenterologist:  None (unassigned; pt seen on family request--grandson's wife is Dr. Doreatha Lew, formerly of [facility])  Reason for Consultation:  Heme positive stool and anemia  HPI: Ethan Lawrence is a 78 y.o. male with severe chronic leg pain in association with advanced degenerative disease of the spine, status post unsuccessful spine surgery by Dr. Karene Fry, who came into the emergency room last night while in town visiting family, from his home in [place], because of an exacerbation of his leg pain. He was found during evaluation to have a hemoglobin of 6.4, with normal MCV and RDW, and he was also found to be Hemoccult-positive.  There are multiple pertinent issues with respect to his anemia and heme positivity. For one thing, he is 40 years status post surgery for a perforated ulcer, during which time they apparently removed "half of his stomach." Moreover, the patient is chronically on Celebrex as an outpatient because of his arthritic pain, and is not on PPI therapy. On top of that, he is chronically anticoagulated with Coumadin because of a history of atrial fibrillation. His family indicates that his hemoglobin last year was 72. In the emergency room, his stool was non-melenic in character, although it was Hemoccult positive.  The patient takes iron as an outpatient. He tends to run dark stools as a result of that, but he has not had any prodromal dyspeptic symptoms nor any clinically evident GI bleeding such as melena or hematochezia.  He indicates that he had a colonoscopy about 8 years ago that was apparently negative for polyps. That was done in [place]. He does not recall ever having had endoscopic evaluation.  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 However, his ambulation is  severely limited, and he has to spent most of his time in a chair.  Since coming into the hospital, his INR, which was therapeutic on admission at 2.2, has been reversed with FFP and vitamin K, and he has been transfused to a hemoglobin of 8.5.   Past Medical History  Diagnosis Date  . Hypertension   . Hyperlipidemia   . CAD (coronary artery disease)   . Atrial fib/flutter, transient   . Myocardial infarction     Past Surgical History  Procedure Date  . Endovascular stent insertion 07/05/2010  . Appendectomy   . Coronary artery bypass graft   . Vascular surgery   . Back surgery   . Cardiac catheterization     Prior to Admission medications   Medication Sig Start Date End Date Taking? Authorizing Provider  CELEBREX 200 MG capsule Take 200 mg by mouth daily.  09/28/10  Yes Historical Provider, MD  celecoxib (CELEBREX) 100 MG capsule Take 100 mg by mouth daily.    Yes Historical Provider, MD  diazepam (VALIUM) 10 MG tablet Take 10 mg by mouth 3 (three) times daily.   Yes Historical Provider, MD  ferrous sulfate 325 (65 FE) MG tablet Take 325 mg by mouth daily with breakfast.  10/09/10  Yes Historical Provider, MD  folic acid (FOLVITE) 1 MG tablet Take 1 mg by mouth daily.    Yes Historical Provider, MD  furosemide (LASIX) 20 MG tablet Take 20 mg by mouth 2 (two) times daily.   Yes Historical Provider, MD  HYDROcodone-acetaminophen (LORTAB) 10-500 MG per tablet Take 1 tablet by mouth  every 6 (six) hours as needed. For pain  10/18/10  Yes Historical Provider, MD  predniSONE (DELTASONE) 10 MG tablet pack Take 10 mg by mouth daily.  10/25/10  Yes Historical Provider, MD  predniSONE (DELTASONE) 5 MG tablet Take 5 mg by mouth daily.    Yes Historical Provider, MD  simvastatin (ZOCOR) 20 MG tablet Take 20 mg by mouth at bedtime.    Yes Historical Provider, MD  triamterene-hydrochlorothiazide (MAXZIDE-25) 37.5-25 MG per tablet Take 1 tablet by mouth daily.     Yes Historical Provider, MD    warfarin (COUMADIN) 5 MG tablet Take 5 mg by mouth as directed.    Yes Historical Provider, MD  Vitamin D, Ergocalciferol, (DRISDOL) 50000 UNITS CAPS Take 50,000 Units by mouth every 7 (seven) days.  10/08/10   Historical Provider, MD    Current Facility-Administered Medications  Medication Dose Route Frequency Provider Last Rate Last Dose  . 0.9 %  sodium chloride infusion  250 mL Intravenous PRN Orbie Hurst, MD 10 mL/hr at 06/16/11 1317 250 mL at 06/16/11 1317  . acetaminophen (TYLENOL) tablet 650 mg  650 mg Oral Q6H PRN Darnelle Maffucci, MD      . albuterol (PROVENTIL HFA;VENTOLIN HFA) 108 (90 BASE) MCG/ACT inhaler 2 puff  2 puff Inhalation Q6H PRN Orbie Hurst, MD      . diazepam (VALIUM) tablet 2 mg  2 mg Oral Q6H PRN Darnelle Maffucci, MD      . gabapentin (NEURONTIN) capsule 300 mg  300 mg Oral TID Darnelle Maffucci, MD   300 mg at 06/16/11 1455  . morphine 2 MG/ML injection 2 mg  2 mg Intravenous Once Orbie Hurst, MD   2 mg at 06/16/11 0600  . morphine 2 MG/ML injection 2 mg  2 mg Intravenous Once Orbie Hurst, MD   2 mg at 06/16/11 0617  . oxyCODONE-acetaminophen (PERCOCET) 5-325 MG per tablet 1 tablet  1 tablet Oral Q4H PRN Darnelle Maffucci, MD      . pantoprazole (PROTONIX) 80 mg in sodium chloride 0.9 % 250 mL infusion  8 mg/hr Intravenous Continuous Orbie Hurst, MD 25 mL/hr at 06/16/11 1454 8 mg/hr at 06/16/11 1454  . phytonadione (VITAMIN K) 10 mg in dextrose 5 % 50 mL IVPB  10 mg Intravenous Once Hilario Quarry, MD   10 mg at 06/16/11 0003  . predniSONE (DELTASONE) tablet 5 mg  5 mg Oral Q breakfast Orbie Hurst, MD   5 mg at 06/16/11 1455  . rOPINIRole (REQUIP) tablet 2 mg  2 mg Oral TID Darnelle Maffucci, MD   2 mg at 06/16/11 1456  . sodium chloride 0.9 % bolus 500 mL  500 mL Intravenous Once Hilario Quarry, MD   500 mL at 06/16/11 0003  . traMADol (ULTRAM) tablet 100 mg  100 mg Oral Q6H PRN Darnelle Maffucci, MD   100 mg at 06/16/11 1503  . DISCONTD: rOPINIRole (REQUIP XL) 24 hr tablet 8 mg  8 mg Oral  QHS Alyson Reedy, MD      . DISCONTD: rOPINIRole (REQUIP XL) 24 hr tablet 8 mg  8 mg Oral Once Orbie Hurst, MD        Allergies as of 06/15/2011  . (No Known Allergies)    Family History  Problem Relation Age of Onset  . Coronary artery disease Brother     History   Social History  . Marital Status: Married    Spouse Name: N/A    Number of Children: N/A  .  Years of Education: N/A   Occupational History  . Not on file.   Social History Main Topics  . Smoking status: Former Smoker    Quit date: 10/29/1975  . Smokeless tobacco: Not on file  . Alcohol Use: No  . Drug Use: No  . Sexually Active: Not on file   Other Topics Concern  . Not on file   Social History Narrative  . No narrative on file    Review of Systems: Positive for: Arthritic pain but otherwise generally negative  Gen:  Denies any involuntary weight loss CV:  Denies chest pain, angina, palpitations, syncope, orthopnea Resp: Denies dyspnea, cough, sputum,coughing up blood. GI: Denies dysphagia, odynophagia, abdominal pain, black stools, rectal bleeding, constipation, diarrhea MS: Pertinent for chronic back pain and leg pain as noted in history of present illness  Physical Exam: Vital signs in last 24 hours: Temp:  [97.5 F (36.4 C)-98.6 F (37 C)] 98.2 F (36.8 C) (04/07 1220) Pulse Rate:  [50-112] 80  (04/07 1600) Resp:  [12-24] 15  (04/07 1600) BP: (84-128)/(47-91) 97/48 mmHg (04/07 1600) SpO2:  [92 %-100 %] 98 % (04/07 1600) Weight:  [75.6 kg (166 lb 10.7 oz)-83.915 kg (185 lb)] 75.6 kg (166 lb 10.7 oz) (04/07 1300) Last BM Date: 06/16/11 General:   Alert,  Well-developed, well-nourished, pleasant and cooperative in NAD. This patient looks quite a bit younger than his age. Head:  Normocephalic and atraumatic. Eyes:  Sclera clear, no icterus.    Mouth:   No ulcerations or lesions.  Oropharynx pink & moist. Neck:   No masses or thyromegaly. Lungs:  Clear throughout to auscultation.   No  wheezes, crackles, or rhonchi. No evident respiratory distress. Heart:   Irregular rhythm consistent with base; no clicks, rubs,  or gallops. Possible soft flow murmur. Abdomen:  Soft, nontender, nontympanitic, and nondistended. No masses, hepatosplenomegaly or ventral hernias noted. Normal bowel sounds, without bruits, guarding, or rebound.  Midline incision well healed Rectal:  This was performed in the ER and showed brown, heme positive stool, per discussion with the referring physician   Msk:   Symmetrical without gross deformities. Neurologic:  Alert and coherent;  grossly normal neurologically. Cognitively intact Skin:  Intact without significant lesions or rashes. Cervical Nodes:  No significant cervical adenopathy. Psych:   Alert and cooperative. Normal mood and affect.  Intake/Output from previous day: 04/06 0701 - 04/07 0700 In: 912.5 [Blood:212.5] Out: -  Intake/Output this shift: Total I/O In: 616.3 [I.V.:329.3; Blood:287] Out: 350 [Urine:350]  Lab Results:  [facility] 06/16/11 1437 06/15/11 2235  WBC 6.9 5.8  HGB 8.5* 6.4*  HCT 26.3* 20.0*  PLT 199 211   BMET  Basename 06/16/11 1437 06/15/11 2235  NA 141 137  K 4.0 4.7  CL 105 102  CO2 25 24  GLUCOSE 92 111*  BUN 47* 58*  CREATININE 1.98* 2.50*  CALCIUM 9.1 8.9   LFT  Basename 06/15/11 2310  PROT 6.3  ALBUMIN 3.5  AST 13  ALT 10  ALKPHOS 89  BILITOT 0.2*  BILIDIR 0.1  IBILI 0.1*   PT/INR  Basename 06/16/11 1437 06/15/11 2235  LABPROT 16.7* 25.6*  INR 1.33 2.29*     Studies/Results: Ct Abdomen Pelvis Wo Contrast  06/16/2011  *RADIOLOGY REPORT*  Clinical Data: Anemia, rectal bleeding, bilateral leg cramps, abdominal aortic aneurysm with endovascular stent  CT ABDOMEN AND PELVIS WITHOUT CONTRAST  Technique:  Multidetector CT imaging of the abdomen and pelvis was performed following the standard protocol without intravenous  contrast.  Comparison: 10/29/2010  Findings: Mild dependent atelectasis at  the left lung base. Healing left posterolateral 9th rib fracture (series 5/image 12).  Numerous surgical clips in the left upper abdomen.  Numerous hepatic and splenic calcified granulomata.  Pancreas and adrenal glands within normal limits.  Status post cholecystectomy.  No intrahepatic or extrahepatic ductal dilatation.  Bilateral renal atrophy with stable bilateral renal cysts.  No hydronephrosis.  No evidence of bowel obstruction.  Colonic diverticulosis, without associated inflammatory changes. No colonic mass/wall thickening.  5.1 x 4.9 cm infrarenal abdominal aortic aneurysm (series 3/image 50), mildly decreased.  Indwelling aortobi-iliac stent. Atherosclerotic calcifications of the aorta and branch vessels.  No abdominopelvic ascites.  No evidence of retroperitoneal hematoma.  No suspicious abdominopelvic lymphadenopathy.  Prostate is notable for prior TURP defect.  Bladder is within normal limits.  Degenerative changes of the visualized thoracolumbar spine. Moderate superior endplate compression deformity at L4 with prior vertebral augmentation, unchanged.  IMPRESSION: 5.1 x 4.9 cm infrarenal abdominal aortic aneurysm, mildly decreased.  Indwelling.  aortobi-iliac stent.  No evidence of retroperitoneal hemorrhage.  No evidence of bowel obstruction.  No colonic mass/wall thickening.  Stable moderate compression deformity at L4 with prior vertebral augmentation.  Additional ancillary findings as above.  Original Report Authenticated By: Charline Bills, M.D.   Dg Chest 1 View  06/16/2011  *RADIOLOGY REPORT*  Clinical Data: Leg cramps.  Prior aneurysm.  Prior back surgery. Hypertension.  Coronary artery disease.  CHEST - 1 VIEW  Comparison: 10/29/2010  Findings: Tortuous thoracic aorta noted with prior CABG and numerous clips in the upper abdomen.  There is evidence of old granulomatous disease.  Suspected chronic rotator cuff tears noted bilaterally.  IMPRESSION:  1.  Old granulomatous disease. 2.  Prior  CABG. 3.  Tortuous thoracic aorta. 4.  Suspected bilateral chronic rotator cuff tears.  Original Report Authenticated By: Dellia Cloud, M.D.    Impression: 1. Severe normocytic anemia. Unfortunately, iron studies will not be available since they were not checked prior to transfusion. Although some of his anemia is probably due to chronic and were subacute GI tract blood loss, given his chronic renal insufficiency, multiple medical conditions, and advanced age, there is probably also an element of "anemia of chronic disease" although it is difficult at the moment to know how big a role that is playing in his anemia. 2. Heme positive stool, on Celebrex for DJD and Coumadin for A. fib 3. Status post prior ulcer surgery,? Partial gastrectomy per patient's history  4. History of negative colonoscopy by his report about 8 years ago, CT showing diverticulosis but no evidenct masses 5. Chronic renal insufficiency and multiple other medical issues as outlined in past medical history above  Plan: Despite the patient's advanced age, given that he should be on chronic anticoagulation, I feel that both endoscopic and colonoscopic evaluation are warranted in view of his heme positive stool and severe anemia. The nature, purpose, and risks of these tests were reviewed with the patient, who would like to discuss things with his family before making a final decision.  If endoscopic and colonoscopic evaluation are done, it would probably need to be done under propofol sedation given his multiple narcotic and sedative medications as an outpatient.  Going forward, even if no ulcer disease is identified at the time of his endoscopy, I would favor chronic PPI prophylaxis as long as he is on ulcerogenic medication (Celebrex) plus Coumadin.  We will plan to check back with the patient tomorrow  and discuss things with his family, if needed, to come to a decision about whether to proceed with endoscopy and  colonoscopy. In the meantime, it is fine for the patient to have a clear liquid diet, and if we decide to proceed, we could do a prep tomorrow and the procedure on Tuesday, ideally under propofol sedation.   LOS: 1 day   Ennis Heavner V  06/16/2011, 5:07 PM

## 2011-06-16 NOTE — ED Notes (Signed)
Dr Synetta Fail at bedside with patient and family.

## 2011-06-16 NOTE — ED Notes (Signed)
PCCM returned page about arrival of patient.  Patient is CAOx3 at this time.  MD to come see patient in ED.

## 2011-06-17 DIAGNOSIS — D649 Anemia, unspecified: Principal | ICD-10-CM

## 2011-06-17 DIAGNOSIS — K625 Hemorrhage of anus and rectum: Secondary | ICD-10-CM

## 2011-06-17 DIAGNOSIS — N289 Disorder of kidney and ureter, unspecified: Secondary | ICD-10-CM

## 2011-06-17 LAB — BASIC METABOLIC PANEL
BUN: 37 mg/dL — ABNORMAL HIGH (ref 6–23)
CO2: 25 mEq/L (ref 19–32)
Chloride: 104 mEq/L (ref 96–112)
GFR calc non Af Amer: 35 mL/min — ABNORMAL LOW (ref >90)
Glucose, Bld: 115 mg/dL — ABNORMAL HIGH (ref 70–99)
Potassium: 3.9 mEq/L (ref 3.5–5.1)
Sodium: 139 mEq/L (ref 135–145)

## 2011-06-17 LAB — CBC
HCT: 27.4 % — ABNORMAL LOW (ref 39.0–52.0)
Hemoglobin: 8.7 g/dL — ABNORMAL LOW (ref 13.0–17.0)
MCHC: 31.8 g/dL (ref 30.0–36.0)
RBC: 3.17 MIL/uL — ABNORMAL LOW (ref 4.22–5.81)

## 2011-06-17 LAB — PREPARE FRESH FROZEN PLASMA: Unit division: 0

## 2011-06-17 LAB — PROTIME-INR: INR: 1.05 (ref 0.00–1.49)

## 2011-06-17 MED ORDER — GABAPENTIN 300 MG PO CAPS
300.0000 mg | ORAL_CAPSULE | Freq: Every day | ORAL | Status: DC
  Administered 2011-06-18 – 2011-06-19 (×2): 300 mg via ORAL
  Filled 2011-06-17 (×3): qty 1

## 2011-06-17 MED ORDER — ROPINIROLE HCL 1 MG PO TABS
2.0000 mg | ORAL_TABLET | Freq: Every day | ORAL | Status: DC
  Administered 2011-06-18 – 2011-06-19 (×2): 2 mg via ORAL
  Filled 2011-06-17 (×3): qty 2

## 2011-06-17 MED ORDER — PROSIGHT PO TABS
1.0000 | ORAL_TABLET | Freq: Two times a day (BID) | ORAL | Status: DC
  Administered 2011-06-17 – 2011-06-20 (×6): 1 via ORAL
  Filled 2011-06-17 (×8): qty 1

## 2011-06-17 NOTE — Progress Notes (Signed)
Pt profile: Patient is an 78 y/o M with PMHx of HTN, DL, CAD, A fib on chronic anticoag with coumadin, AAA s/p endovascular repair and Hx of peptic ulcer disease s/p ?gastric surgery who was admitted to [facility] due to Hb 6.4.  Active problems: GIB CAD, mod-severe AS, moderate MR   Subj: Lethargic. No distress. No new complaints  Obj: Filed Vitals:   06/17/11 1614  BP: 108/67  Pulse:   Temp:   Resp: 28    Gen: NAD HEENT: WNL Neck: No JVD Chest: Coarse bibasilar BS, no wheezes Cardiac: RRR. III/VI systolic M with radiation to axilla and to neck Abd: NABS, soft, NT Ext: No edema  BMET    Component Value Date/Time   NA 139 06/17/2011 0606   K 3.9 06/17/2011 0606   CL 104 06/17/2011 0606   CO2 25 06/17/2011 0606   GLUCOSE 115* 06/17/2011 0606   BUN 37* 06/17/2011 0606   CREATININE 1.69* 06/17/2011 0606   CALCIUM 8.9 06/17/2011 0606   GFRNONAA 35* 06/17/2011 0606   GFRAA 40* 06/17/2011 0606    CBC    Component Value Date/Time   WBC 7.8 06/17/2011 0606   RBC 3.17* 06/17/2011 0606   HGB 8.7* 06/17/2011 0606   HCT 27.4* 06/17/2011 0606   PLT 199 06/17/2011 0606   MCV 86.4 06/17/2011 0606   MCH 27.4 06/17/2011 0606   MCHC 31.8 06/17/2011 0606   RDW 15.1 06/17/2011 0606   LYMPHSABS 0.8 06/15/2011 2235   MONOABS 0.6 06/15/2011 2235   EOSABS 0.1 06/15/2011 2235   BASOSABS 0.0 06/15/2011 2235    CXR:  No new CXR  IMPRESSION: 1) GIB - source unclear. Hemodynamically stable.  2) H/O CAD and valvular heart disease - compensated   PLAN/RECS:  Cont current Rx. GI following. Keep in SDU today until endoscopies can be performed. Will transfer to [facility] service. Discussed with Dr Sharon Seller. PCCM will sign off as of AM 4/9. Please call if we can be of further assistance  Billy Fischer, MD;  PCCM service; Mobile [phone]

## 2011-06-17 NOTE — Progress Notes (Signed)
[facility] Progress Note  Subjective: Patient is very lethargic. Discussed EGD and colonoscopy with his daughter and she told me that the family and patient were agreeable to have them done. Objective: Vital signs in last 24 hours: Temp:  [97.8 F (36.6 C)-98.7 F (37.1 C)] 98.7 F (37.1 C) (04/08 0801) Pulse Rate:  [57-86] 79  (04/08 0801) Resp:  [14-27] 17  (04/08 0801) BP: (89-119)/(41-84) 107/67 mmHg (04/08 0801) SpO2:  [96 %-100 %] 99 % (04/08 0801) Weight:  [75.6 kg (166 lb 10.7 oz)-76.4 kg (168 lb 6.9 oz)] 76.4 kg (168 lb 6.9 oz) (04/08 0500) Weight change: -8.316 kg (-18 lb 5.3 oz)   XB:JYNWGNFAO, wakes up but falls back off to sleep.       Abdomen soft non tender.  Lab Results: Results for orders placed during the hospital encounter of 06/15/11 (from the past 24 hour(s))  MRSA PCR SCREENING     Status: Normal   Collection Time   06/16/11 12:56 PM      Component Value Range   MRSA by PCR NEGATIVE  NEGATIVE   CBC     Status: Abnormal   Collection Time   06/16/11  2:37 PM      Component Value Range   WBC 6.9  4.0 - 10.5 (K/uL)   RBC 3.14 (*) 4.22 - 5.81 (MIL/uL)   Hemoglobin 8.5 (*) 13.0 - 17.0 (g/dL)   HCT 13.0 (*) 86.5 - 52.0 (%)   MCV 83.8  78.0 - 100.0 (fL)   MCH 27.1  26.0 - 34.0 (pg)   MCHC 32.3  30.0 - 36.0 (g/dL)   RDW 78.4  69.6 - 29.5 (%)   Platelets 199  150 - 400 (K/uL)  BASIC METABOLIC PANEL     Status: Abnormal   Collection Time   06/16/11  2:37 PM      Component Value Range   Sodium 141  135 - 145 (mEq/L)   Potassium 4.0  3.5 - 5.1 (mEq/L)   Chloride 105  96 - 112 (mEq/L)   CO2 25  19 - 32 (mEq/L)   Glucose, Bld 92  70 - 99 (mg/dL)   BUN 47 (*) 6 - 23 (mg/dL)   Creatinine, Ser 2.84 (*) 0.50 - 1.35 (mg/dL)   Calcium 9.1  8.4 - 13.2 (mg/dL)   GFR calc non Af Amer 29 (*) >90 (mL/min)   GFR calc Af Amer 33 (*) >90 (mL/min)  PROTIME-INR     Status: Abnormal   Collection Time   06/16/11  2:37 PM      Component Value Range   Prothrombin  Time 16.7 (*) 11.6 - 15.2 (seconds)   INR 1.33  0.00 - 1.49   HEMOGLOBIN AND HEMATOCRIT, BLOOD     Status: Abnormal   Collection Time   06/16/11  9:52 PM      Component Value Range   Hemoglobin 8.2 (*) 13.0 - 17.0 (g/dL)   HCT 44.0 (*) 10.2 - 52.0 (%)  CBC     Status: Abnormal   Collection Time   06/17/11  6:06 AM      Component Value Range   WBC 7.8  4.0 - 10.5 (K/uL)   RBC 3.17 (*) 4.22 - 5.81 (MIL/uL)   Hemoglobin 8.7 (*) 13.0 - 17.0 (g/dL)   HCT 72.5 (*) 36.6 - 52.0 (%)   MCV 86.4  78.0 - 100.0 (fL)   MCH 27.4  26.0 - 34.0 (pg)   MCHC 31.8  30.0 - 36.0 (g/dL)  RDW 15.1  11.5 - 15.5 (%)   Platelets 199  150 - 400 (K/uL)  BASIC METABOLIC PANEL     Status: Abnormal   Collection Time   06/17/11  6:06 AM      Component Value Range   Sodium 139  135 - 145 (mEq/L)   Potassium 3.9  3.5 - 5.1 (mEq/L)   Chloride 104  96 - 112 (mEq/L)   CO2 25  19 - 32 (mEq/L)   Glucose, Bld 115 (*) 70 - 99 (mg/dL)   BUN 37 (*) 6 - 23 (mg/dL)   Creatinine, Ser 4.54 (*) 0.50 - 1.35 (mg/dL)   Calcium 8.9  8.4 - 09.8 (mg/dL)   GFR calc non Af Amer 35 (*) >90 (mL/min)   GFR calc Af Amer 40 (*) >90 (mL/min)  PROTIME-INR     Status: Normal   Collection Time   06/17/11  6:06 AM      Component Value Range   Prothrombin Time 13.9  11.6 - 15.2 (seconds)   INR 1.05  0.00 - 1.49     Studies/Results: @RISRSLT24 @    Assessment: Anemia and heme positive stool.  Plan: Will plan on EGD and colonoscopy when we are able to prep him. He is so lethargic at this time that we could not prep him.    Graylin Shiver 06/17/2011, 11:42 AM  Lab Results  Component Value Date   HGB 8.7* 06/17/2011   HGB 8.2* 06/16/2011   HGB 8.5* 06/16/2011   HCT 27.4* 06/17/2011   HCT 25.5* 06/16/2011   HCT 26.3* 06/16/2011   ALKPHOS 89 06/15/2011   ALKPHOS 66 07/04/2010   AST 13 06/15/2011   AST 20 07/04/2010   ALT 10 06/15/2011   ALT 22 07/04/2010

## 2011-06-18 DIAGNOSIS — D649 Anemia, unspecified: Secondary | ICD-10-CM | POA: Diagnosis present

## 2011-06-18 DIAGNOSIS — K922 Gastrointestinal hemorrhage, unspecified: Secondary | ICD-10-CM

## 2011-06-18 DIAGNOSIS — I482 Chronic atrial fibrillation, unspecified: Secondary | ICD-10-CM | POA: Diagnosis present

## 2011-06-18 DIAGNOSIS — Z9889 Other specified postprocedural states: Secondary | ICD-10-CM

## 2011-06-18 DIAGNOSIS — I10 Essential (primary) hypertension: Secondary | ICD-10-CM | POA: Diagnosis present

## 2011-06-18 DIAGNOSIS — I2510 Atherosclerotic heart disease of native coronary artery without angina pectoris: Secondary | ICD-10-CM | POA: Diagnosis present

## 2011-06-18 DIAGNOSIS — M1990 Unspecified osteoarthritis, unspecified site: Secondary | ICD-10-CM | POA: Diagnosis present

## 2011-06-18 DIAGNOSIS — G8929 Other chronic pain: Secondary | ICD-10-CM | POA: Diagnosis present

## 2011-06-18 HISTORY — DX: Gastrointestinal hemorrhage, unspecified: K92.2

## 2011-06-18 LAB — BASIC METABOLIC PANEL
CO2: 25 mEq/L (ref 19–32)
Chloride: 107 mEq/L (ref 96–112)
Glucose, Bld: 89 mg/dL (ref 70–99)
Potassium: 3.6 mEq/L (ref 3.5–5.1)
Sodium: 139 mEq/L (ref 135–145)

## 2011-06-18 LAB — CBC
HCT: 26.7 % — ABNORMAL LOW (ref 39.0–52.0)
Hemoglobin: 8.3 g/dL — ABNORMAL LOW (ref 13.0–17.0)
Hemoglobin: 9 g/dL — ABNORMAL LOW (ref 13.0–17.0)
MCH: 26.6 pg (ref 26.0–34.0)
MCH: 26.8 pg (ref 26.0–34.0)
MCHC: 31.4 g/dL (ref 30.0–36.0)
MCV: 85.6 fL (ref 78.0–100.0)
Platelets: 200 10*3/uL (ref 150–400)
RBC: 3.12 MIL/uL — ABNORMAL LOW (ref 4.22–5.81)
RBC: 3.36 MIL/uL — ABNORMAL LOW (ref 4.22–5.81)
WBC: 6.8 10*3/uL (ref 4.0–10.5)

## 2011-06-18 LAB — TYPE AND SCREEN
ABO/RH(D): O NEG
Antibody Screen: NEGATIVE
Unit division: 0

## 2011-06-18 LAB — COMPREHENSIVE METABOLIC PANEL
AST: 14 U/L (ref 0–37)
CO2: 26 mEq/L (ref 19–32)
Calcium: 9.4 mg/dL (ref 8.4–10.5)
Chloride: 103 mEq/L (ref 96–112)
Creatinine, Ser: 1.58 mg/dL — ABNORMAL HIGH (ref 0.50–1.35)
GFR calc Af Amer: 44 mL/min — ABNORMAL LOW (ref >90)
GFR calc non Af Amer: 38 mL/min — ABNORMAL LOW (ref >90)
Glucose, Bld: 93 mg/dL (ref 70–99)
Total Bilirubin: 0.5 mg/dL (ref 0.3–1.2)

## 2011-06-18 MED ORDER — FENTANYL CITRATE 0.05 MG/ML IJ SOLN: 50.0000 ug | INTRAMUSCULAR | Status: DC | PRN

## 2011-06-18 MED ORDER — PEG 3350-KCL-NA BICARB-NACL 420 G PO SOLR
4000.0000 mL | Freq: Once | ORAL | Status: AC
  Administered 2011-06-18: 4000 mL via ORAL
  Filled 2011-06-18: qty 4000

## 2011-06-18 MED ORDER — MIDAZOLAM HCL 2 MG/2ML IJ SOLN: 0.5000 mg | INTRAMUSCULAR | Status: DC | PRN

## 2011-06-18 NOTE — Progress Notes (Signed)
[facility] Progress Note  Subjective: Doing well. Alert today. Discussed EGD and colonoscopy with him and he is agreeable.  Objective: Vital signs in last 24 hours: Temp:  [97.9 F (36.6 C)-98.5 F (36.9 C)] 98.1 F (36.7 C) (04/09 0800) Pulse Rate:  [69-102] 69  (04/09 0800) Resp:  [14-28] 19  (04/09 0800) BP: (87-123)/(38-71) 104/71 mmHg (04/09 0800) SpO2:  [96 %-97 %] 97 % (04/09 0800) Weight:  [75 kg (165 lb 5.5 oz)] 75 kg (165 lb 5.5 oz) (04/09 0500) Weight change: -0.6 kg (-1 lb 5.2 oz)   JX:BJYNWGN soft non tender  Lab Results: Results for orders placed during the hospital encounter of 06/15/11 (from the past 24 hour(s))  BASIC METABOLIC PANEL     Status: Abnormal   Collection Time   06/18/11  6:50 AM      Component Value Range   Sodium 139  135 - 145 (mEq/L)   Potassium 3.6  3.5 - 5.1 (mEq/L)   Chloride 107  96 - 112 (mEq/L)   CO2 25  19 - 32 (mEq/L)   Glucose, Bld 89  70 - 99 (mg/dL)   BUN 24 (*) 6 - 23 (mg/dL)   Creatinine, Ser 5.62 (*) 0.50 - 1.35 (mg/dL)   Calcium 9.2  8.4 - 13.0 (mg/dL)   GFR calc non Af Amer 38 (*) >90 (mL/min)   GFR calc Af Amer 44 (*) >90 (mL/min)  CBC     Status: Abnormal   Collection Time   06/18/11  6:50 AM      Component Value Range   WBC 6.8  4.0 - 10.5 (K/uL)   RBC 3.12 (*) 4.22 - 5.81 (MIL/uL)   Hemoglobin 8.3 (*) 13.0 - 17.0 (g/dL)   HCT 86.5 (*) 78.4 - 52.0 (%)   MCV 85.6  78.0 - 100.0 (fL)   MCH 26.6  26.0 - 34.0 (pg)   MCHC 31.1  30.0 - 36.0 (g/dL)   RDW 69.6  29.5 - 28.4 (%)   Platelets 191  150 - 400 (K/uL)    Studies/Results: @RISRSLT24 @    Assessment: Anemia, heme positive stools.  Plan: EGD, colonoscopy tomorrow.    Earlyn Sylvan F 06/18/2011, 10:49 AM

## 2011-06-18 NOTE — Progress Notes (Signed)
[facility]  Interim history: Pt transferred to our service from PCCM. He presented to the ER for leg pain and found to be anemia with heme pos stools. He is on Coumadin for a-fib. He noted dark stool but thought they were due to Iron which he was started on recently. He was told by PCP stool would get dark and therefore was not alarmed by the change. He takes prednisone, hydrocodone and celebrex for back pain radiating to legs. He is 40 years status post surgery for a perforated ulcer, during which time they apparently removed "half of his stomach." No recent h/o ulcers. He has lost 35 lbs in 1 yr due to poor appetite. He drinks ensure but "not like I'm supposed to".   Subjective: No complaints today. Drinking prep for endoscopy tomorrow.   Objective: Blood pressure 113/19, pulse 89, temperature 98.7 F (37.1 C), temperature source Oral, resp. rate 22, height 5\' 10"  (1.778 m), weight 75 kg (165 lb 5.5 oz), SpO2 100.00%. Weight change: -0.6 kg (-1 lb 5.2 oz)  Intake/Output Summary (Last 24 hours) at 06/18/11 1654 Last data filed at 06/18/11 1600  Gross per 24 hour  Intake   2040 ml  Output   1225 ml  Net    815 ml    Physical Exam: General appearance: alert, cooperative and no distress Lungs: clear to auscultation bilaterally Heart: irregularly irregular rhythm Abdomen: soft, non-tender; bowel sounds normal; no masses,  no organomegaly Extremities: extremities normal, atraumatic, no cyanosis or edema  Lab Results:  [facility] 06/18/11 0650 06/17/11 0606  NA 139 139  K 3.6 3.9  CL 107 104  CO2 25 25  GLUCOSE 89 115*  BUN 24* 37*  CREATININE 1.58* 1.69*  CALCIUM 9.2 8.9  MG -- --  PHOS -- --    Basename 06/15/11 2310  AST 13  ALT 10  ALKPHOS 89  BILITOT 0.2*  PROT 6.3  ALBUMIN 3.5   No results found for this basename: LIPASE:2,AMYLASE:2 in the last 72 hours  Basename 06/18/11 0650 06/17/11 0606 06/15/11 2235  WBC 6.8 7.8 --  NEUTROABS -- -- 4.3  HGB 8.3*  8.7* --  HCT 26.7* 27.4* --  MCV 85.6 86.4 --  PLT 191 199 --    Basename 06/15/11 2236  CKTOTAL 164  CKMB 4.4*  CKMBINDEX --  TROPONINI --   No components found with this basename: POCBNP:3 No results found for this basename: DDIMER:2 in the last 72 hours No results found for this basename: HGBA1C:2 in the last 72 hours No results found for this basename: CHOL:2,HDL:2,LDLCALC:2,TRIG:2,CHOLHDL:2,LDLDIRECT:2 in the last 72 hours No results found for this basename: TSH,T4TOTAL,FREET3,T3FREE,THYROIDAB in the last 72 hours No results found for this basename: VITAMINB12:2,FOLATE:2,FERRITIN:2,TIBC:2,IRON:2,RETICCTPCT:2 in the last 72 hours  Micro Results: Recent Results (from the past 240 hour(s))  MRSA PCR SCREENING     Status: Normal   Collection Time   06/16/11 12:56 PM      Component Value Range Status Comment   MRSA by PCR NEGATIVE  NEGATIVE  Final     Studies/Results: Ct Abdomen Pelvis Wo Contrast  06/16/2011  *RADIOLOGY REPORT*  Clinical Data: Anemia, rectal bleeding, bilateral leg cramps, abdominal aortic aneurysm with endovascular stent  CT ABDOMEN AND PELVIS WITHOUT CONTRAST  Technique:  Multidetector CT imaging of the abdomen and pelvis was performed following the standard protocol without intravenous contrast.  Comparison: 10/29/2010  Findings: Mild dependent atelectasis at the left lung base. Healing left posterolateral 9th rib fracture (series 5/image 12).  Numerous surgical clips in the left upper abdomen.  Numerous hepatic and splenic calcified granulomata.  Pancreas and adrenal glands within normal limits.  Status post cholecystectomy.  No intrahepatic or extrahepatic ductal dilatation.  Bilateral renal atrophy with stable bilateral renal cysts.  No hydronephrosis.  No evidence of bowel obstruction.  Colonic diverticulosis, without associated inflammatory changes. No colonic mass/wall thickening.  5.1 x 4.9 cm infrarenal abdominal aortic aneurysm (series 3/image 50), mildly  decreased.  Indwelling aortobi-iliac stent. Atherosclerotic calcifications of the aorta and branch vessels.  No abdominopelvic ascites.  No evidence of retroperitoneal hematoma.  No suspicious abdominopelvic lymphadenopathy.  Prostate is notable for prior TURP defect.  Bladder is within normal limits.  Degenerative changes of the visualized thoracolumbar spine. Moderate superior endplate compression deformity at L4 with prior vertebral augmentation, unchanged.  IMPRESSION: 5.1 x 4.9 cm infrarenal abdominal aortic aneurysm, mildly decreased.  Indwelling.  aortobi-iliac stent.  No evidence of retroperitoneal hemorrhage.  No evidence of bowel obstruction.  No colonic mass/wall thickening.  Stable moderate compression deformity at L4 with prior vertebral augmentation.  Additional ancillary findings as above.  Original Report Authenticated By: Charline Bills, M.D.   Dg Chest 1 View  06/16/2011  *RADIOLOGY REPORT*  Clinical Data: Leg cramps.  Prior aneurysm.  Prior back surgery. Hypertension.  Coronary artery disease.  CHEST - 1 VIEW  Comparison: 10/29/2010  Findings: Tortuous thoracic aorta noted with prior CABG and numerous clips in the upper abdomen.  There is evidence of old granulomatous disease.  Suspected chronic rotator cuff tears noted bilaterally.  IMPRESSION:  1.  Old granulomatous disease. 2.  Prior CABG. 3.  Tortuous thoracic aorta. 4.  Suspected bilateral chronic rotator cuff tears.  Original Report Authenticated By: Dellia Cloud, M.D.    Medications: Scheduled Meds:    . gabapentin  300 mg Oral QHS  . multivitamin  1 tablet Oral BID  . polyethylene glycol-electrolytes  4,000 mL Oral Once  . predniSONE  5 mg Oral Q breakfast  . rOPINIRole  2 mg Oral QHS   Continuous Infusions:    . pantoprozole (PROTONIX) infusion 8 mg/hr (06/18/11 1008)   PRN Meds:.sodium chloride, acetaminophen, albuterol, diazepam, oxyCODONE-acetaminophen, traMADol  Assessment/Plan: Principal Problem:   *Anemia hgb 6/4 on admission despite taking iron as oupt S/p 2 units prbc with subsequent stable hgb  Active Problems:  GI bleed hemoccult positive On Protonix drip couimadin held - inr reversed For egd and colonoscopy tomorrow   Back pain, chronic On Narcotics   Chronic a-fib on Coumadin INR reversed Cardiologist is Dr Katrinka Blazing    History of AAA (abdominal aortic aneurysm) repair   Arthritis On Prednisone and Celebrex    CAD (coronary artery disease) s/p CABG   Hypotension with h/o HTN (hypertension) Maxizide on hold   LOS: 3 days   [facility] [phone] 06/18/2011, 4:54 PM

## 2011-06-19 ENCOUNTER — Encounter (HOSPITAL_COMMUNITY): Payer: Self-pay | Admitting: Anesthesiology

## 2011-06-19 ENCOUNTER — Encounter (HOSPITAL_COMMUNITY)
Admission: EM | Disposition: A | Discharge status: Home / Self Care | Payer: Self-pay | Source: Home / Self Care | Attending: Internal Medicine

## 2011-06-19 ENCOUNTER — Inpatient Hospital Stay (HOSPITAL_COMMUNITY): Payer: Medicare PPO | Admitting: Anesthesiology

## 2011-06-19 HISTORY — PX: COLONOSCOPY: SHX5424

## 2011-06-19 HISTORY — PX: ESOPHAGOGASTRODUODENOSCOPY: SHX5428

## 2011-06-19 SURGERY — EGD (ESOPHAGOGASTRODUODENOSCOPY)
Anesthesia: Monitor Anesthesia Care

## 2011-06-19 SURGERY — ESOPHAGOGASTRODUODENOSCOPY (EGD) WITH PROPOFOL
Anesthesia: Monitor Anesthesia Care

## 2011-06-19 MED ORDER — PHENYLEPHRINE HCL 10 MG/ML IJ SOLN
INTRAMUSCULAR | Status: DC | PRN
  Administered 2011-06-19 (×2): 80 ug via INTRAVENOUS
  Administered 2011-06-19: 40 ug via INTRAVENOUS

## 2011-06-19 MED ORDER — FENTANYL CITRATE 0.05 MG/ML IJ SOLN: 25.0000 ug | INTRAMUSCULAR | Status: DC | PRN

## 2011-06-19 MED ORDER — PROPOFOL 10 MG/ML IV EMUL
INTRAVENOUS | Status: DC | PRN
  Administered 2011-06-19: 100 ug/kg/min via INTRAVENOUS

## 2011-06-19 MED ORDER — FENTANYL CITRATE 0.05 MG/ML IJ SOLN
INTRAMUSCULAR | Status: DC | PRN
  Administered 2011-06-19 (×2): 50 ug via INTRAVENOUS

## 2011-06-19 MED ORDER — MIDAZOLAM HCL 5 MG/5ML IJ SOLN
INTRAMUSCULAR | Status: DC | PRN
  Administered 2011-06-19: 1 mg via INTRAVENOUS

## 2011-06-19 MED ORDER — LACTATED RINGERS IV SOLN
INTRAVENOUS | Status: DC | PRN
  Administered 2011-06-19: 13:00:00 via INTRAVENOUS

## 2011-06-19 MED ORDER — ONDANSETRON HCL 4 MG/2ML IJ SOLN: 4.0000 mg | Freq: Four times a day (QID) | INTRAMUSCULAR | Status: DC | PRN

## 2011-06-19 MED ORDER — PROPOFOL 10 MG/ML IV EMUL
INTRAVENOUS | Status: DC | PRN
  Administered 2011-06-19 (×2): 10 mg via INTRAVENOUS

## 2011-06-19 NOTE — Evaluation (Signed)
Physical Therapy Evaluation Patient Details Name: Ethan Lawrence MRN: [id number] DOB: 1922/06/21 Today's Date: 06/19/2011  Problem List:  Patient Active Problem List  Diagnoses  . History of AAA (abdominal aortic aneurysm) repair  . Anemia  . GI bleed  . Back pain, chronic  . Arthritis  . Chronic a-fib on Coumadin  . CAD (coronary artery disease) s/p CABG  . HTN (hypertension)    Past Medical History:  Past Medical History  Diagnosis Date  . Hypertension   . Hyperlipidemia   . CAD (coronary artery disease)   . Atrial fib/flutter, transient   . Myocardial infarction    Past Surgical History:  Past Surgical History  Procedure Date  . Endovascular stent insertion 07/05/2010  . Appendectomy   . Coronary artery bypass graft   . Vascular surgery   . Back surgery   . Cardiac catheterization     PT Assessment/Plan/Recommendation PT Assessment Clinical Impression Statement: pt arrived to ED for painful legs over his usual chronic leg pain.  Found that Hgb was 6.4  Testing done without clear etiology, but work done to incr HgB.  Pt's extreme cramping pain has been generally alleviated, but the chronic pain remains..  Pt still weakened and had decr activity tolerance.  Will see on acute, but no follow up needs. PT Recommendation/Assessment: Patient will need skilled PT in the acute care venue PT Problem List: Decreased strength;Decreased activity tolerance;Decreased mobility;Pain PT Therapy Diagnosis : Acute pain PT Plan PT Frequency: Min 3X/week PT Treatment/Interventions: Gait training;Stair training;Therapeutic activities PT Recommendation Follow Up Recommendations: No PT follow up Equipment Recommended: None recommended by PT PT Goals  Acute Rehab PT Goals PT Goal Formulation: With patient Time For Goal Achievement: 7 days Pt will go Sit to Stand: Independently PT Goal: Sit to Stand - Progress: Goal set today Pt will Ambulate: >150 feet;Independently PT Goal:  Ambulate - Progress: Goal set today Pt will Go Up / Down Stairs: 3-5 stairs PT Goal: Up/Down Stairs - Progress: Goal set today  PT Evaluation Precautions/Restrictions  Precautions Precautions: Fall Restrictions Weight Bearing Restrictions: No Prior Functioning  Home Living Lives With: Spouse;Other (Comment) (as her caregiver with 2 outside caregiver assist) Receives Help From: Personal care attendant Type of Home: House Home Layout: One level Home Access: Stairs to enter [employer] of Steps: 3 Home Adaptive Equipment: None Prior Function Level of Independence: Independent with basic ADLs;Independent with homemaking with ambulation;Independent with gait;Independent with transfers Able to Take Stairs?: Yes Driving: Yes Vocation: Full time employment Vocation Requirements: Tax inspector Cognition Arousal/Alertness: Awake/alert Overall Cognitive Status: Appears within functional limits for tasks assessed Orientation Level: Oriented X4 Sensation/Coordination Sensation Light Touch: Appears Intact Coordination Gross Motor Movements are Fluid and Coordinated: Yes Fine Motor Movements are Fluid and Coordinated: Yes Extremity Assessment RUE Assessment RUE Assessment: Within Functional Limits LUE Assessment LUE Assessment: Within Functional Limits RLE Assessment RLE Assessment: Within Functional Limits (mildly weaker than L due to pain) LLE Assessment LLE Assessment: Within Functional Limits Mobility (including Balance) Bed Mobility Bed Mobility: No Transfers Transfers: Yes Sit to Stand: 6: Modified independent (Device/Increase time) Stand to Sit: 6: Modified independent (Device/Increase time) Ambulation/Gait Ambulation/Gait: Yes Ambulation/Gait Assistance: 5: Supervision Ambulation/Gait Assistance Details (indicate cue type and reason): mildly antalgic on right due to pain in lateral thigh; generally stable Ambulation Distance  (Feet): 100 Feet Assistive device: None Gait Pattern: Within Functional Limits;Antalgic Stairs: No Wheelchair Mobility Wheelchair Mobility: No  Posture/Postural Control Posture/Postural Control: No significant limitations Balance Balance Assessed:  No Exercise    End of Session PT - End of Session Activity Tolerance: Patient tolerated treatment well;Patient limited by pain Patient left: in chair;with call bell in reach;with family/visitor present Nurse Communication: Mobility status for ambulation General Behavior During Session: [facility] for tasks performed Cognition: [facility] for tasks performed  Ethan Lawrence, Ethan Lawrence 06/19/2011, 5:30 PM  06/19/2011  [facility], PT [phone] (pager)

## 2011-06-19 NOTE — Transfer of Care (Signed)
Immediate Anesthesia Transfer of Care Note  Patient: Ethan Lawrence  Procedure(s) Performed: Procedure(s) (LRB): ESOPHAGOGASTRODUODENOSCOPY (EGD) (N/A) COLONOSCOPY (N/A)  Patient Location: PACU  Anesthesia Type: MAC  Level of Consciousness: awake  Airway & Oxygen Therapy: Patient Spontanous Breathing and Patient connected to nasal cannula oxygen  Post-op Assessment: Report given to PACU RN and Post -op Vital signs reviewed and stable  Post vital signs: Reviewed and stable  Complications: No apparent anesthesia complications

## 2011-06-19 NOTE — Progress Notes (Signed)
[facility]  Interim history: Pt transferred to our service from PCCM. He presented to the ER for leg pain and found to be anemia with heme pos stools. He is on Coumadin for a-fib. He noted dark stool but thought they were due to Iron which he was started on recently. He was told by PCP stool would get dark and therefore was not alarmed by the change. He takes prednisone, hydrocodone and celebrex for back pain radiating to legs. He is 40 years status post surgery for a perforated ulcer, during which time they apparently removed "half of his stomach." No recent h/o ulcers. He has lost 35 lbs in 1 yr due to poor appetite. He drinks ensure but "not like I'm supposed to".   Subjective: No complaints today. EGD today Objective: Blood pressure 118/75, pulse 73, temperature 97.9 F (36.6 C), temperature source Oral, resp. rate 17, height 5\' 10"  (1.778 m), weight 79.2 kg (174 lb 9.7 oz), SpO2 94.00%. Weight change: 4.2 kg (9 lb 4.2 oz)  Intake/Output Summary (Last 24 hours) at 06/19/11 1304 Last data filed at 06/19/11 0500  Gross per 24 hour  Intake    440 ml  Output      0 ml  Net    440 ml    Physical Exam: General appearance: alert, cooperative and no distress Lungs: clear to auscultation bilaterally Heart: irregularly irregular rhythm Abdomen: soft, non-tender; bowel sounds normal; no masses,  no organomegaly Extremities: extremities normal, atraumatic, no cyanosis or edema Neuro-Non focal  Lab Results:  Basename 06/18/11 1745 06/18/11 0650  NA 138 139  K 4.3 3.6  CL 103 107  CO2 26 25  GLUCOSE 93 89  BUN 22 24*  CREATININE 1.58* 1.58*  CALCIUM 9.4 9.2  MG -- --  PHOS -- --    Basename 06/18/11 1745  AST 14  ALT 9  ALKPHOS 89  BILITOT 0.5  PROT 6.2  ALBUMIN 3.5   No results found for this basename: LIPASE:2,AMYLASE:2 in the last 72 hours  Basename 06/18/11 1745 06/18/11 0650  WBC 7.6 6.8  NEUTROABS -- --  HGB 9.0* 8.3*  HCT 28.7* 26.7*  MCV 85.4 85.6  PLT  200 191   No results found for this basename: CKTOTAL:3,CKMB:3,CKMBINDEX:3,TROPONINI:3 in the last 72 hours No components found with this basename: POCBNP:3 No results found for this basename: DDIMER:2 in the last 72 hours No results found for this basename: HGBA1C:2 in the last 72 hours No results found for this basename: CHOL:2,HDL:2,LDLCALC:2,TRIG:2,CHOLHDL:2,LDLDIRECT:2 in the last 72 hours No results found for this basename: TSH,T4TOTAL,FREET3,T3FREE,THYROIDAB in the last 72 hours No results found for this basename: VITAMINB12:2,FOLATE:2,FERRITIN:2,TIBC:2,IRON:2,RETICCTPCT:2 in the last 72 hours  Micro Results: Recent Results (from the past 240 hour(s))  MRSA PCR SCREENING     Status: Normal   Collection Time   06/16/11 12:56 PM      Component Value Range Status Comment   MRSA by PCR NEGATIVE  NEGATIVE  Final     Studies/Results: Ct Abdomen Pelvis Wo Contrast  06/16/2011  *RADIOLOGY REPORT*  Clinical Data: Anemia, rectal bleeding, bilateral leg cramps, abdominal aortic aneurysm with endovascular stent  CT ABDOMEN AND PELVIS WITHOUT CONTRAST  Technique:  Multidetector CT imaging of the abdomen and pelvis was performed following the standard protocol without intravenous contrast.  Comparison: 10/29/2010  Findings: Mild dependent atelectasis at the left lung base. Healing left posterolateral 9th rib fracture (series 5/image 12).  Numerous surgical clips in the left upper abdomen.  Numerous hepatic and splenic  calcified granulomata.  Pancreas and adrenal glands within normal limits.  Status post cholecystectomy.  No intrahepatic or extrahepatic ductal dilatation.  Bilateral renal atrophy with stable bilateral renal cysts.  No hydronephrosis.  No evidence of bowel obstruction.  Colonic diverticulosis, without associated inflammatory changes. No colonic mass/wall thickening.  5.1 x 4.9 cm infrarenal abdominal aortic aneurysm (series 3/image 50), mildly decreased.  Indwelling aortobi-iliac stent.  Atherosclerotic calcifications of the aorta and branch vessels.  No abdominopelvic ascites.  No evidence of retroperitoneal hematoma.  No suspicious abdominopelvic lymphadenopathy.  Prostate is notable for prior TURP defect.  Bladder is within normal limits.  Degenerative changes of the visualized thoracolumbar spine. Moderate superior endplate compression deformity at L4 with prior vertebral augmentation, unchanged.  IMPRESSION: 5.1 x 4.9 cm infrarenal abdominal aortic aneurysm, mildly decreased.  Indwelling.  aortobi-iliac stent.  No evidence of retroperitoneal hemorrhage.  No evidence of bowel obstruction.  No colonic mass/wall thickening.  Stable moderate compression deformity at L4 with prior vertebral augmentation.  Additional ancillary findings as above.  Original Report Authenticated By: Charline Bills, M.D.   Dg Chest 1 View  06/16/2011  *RADIOLOGY REPORT*  Clinical Data: Leg cramps.  Prior aneurysm.  Prior back surgery. Hypertension.  Coronary artery disease.  CHEST - 1 VIEW  Comparison: 10/29/2010  Findings: Tortuous thoracic aorta noted with prior CABG and numerous clips in the upper abdomen.  There is evidence of old granulomatous disease.  Suspected chronic rotator cuff tears noted bilaterally.  IMPRESSION:  1.  Old granulomatous disease. 2.  Prior CABG. 3.  Tortuous thoracic aorta. 4.  Suspected bilateral chronic rotator cuff tears.  Original Report Authenticated By: Dellia Cloud, M.D.    Medications: Scheduled Meds:    . gabapentin  300 mg Oral QHS  . multivitamin  1 tablet Oral BID  . predniSONE  5 mg Oral Q breakfast  . rOPINIRole  2 mg Oral QHS   Continuous Infusions:    . pantoprozole (PROTONIX) infusion 8 mg/hr (06/19/11 1109)   PRN Meds:.sodium chloride, acetaminophen, diazepam, fentaNYL, midazolam, oxyCODONE-acetaminophen, traMADol, DISCONTD: albuterol  Assessment/Plan: Principal Problem:  *Anemia hgb 6/4 on admission despite taking iron as oupt S/p 2 units  prbc with subsequent stable hgb  Active Problems:  GI bleed hemoccult positive On Protonix drip coumadin held - inr reversed For egd and colonoscopy today   Back pain, chronic On Narcotics   Chronic a-fib on Coumadin INR reversed Cardiologist is Dr Katrinka Blazing    History of AAA (abdominal aortic aneurysm) repair   Arthritis On Prednisone   Celebrex-on hold    CAD (coronary artery disease) s/p CABG   Hypotension with h/o HTN (hypertension) Maxizide on hold, BP controlled without any antihypertensives  DVT prophylaxis SCD's  Disposition -remain inpatient-get PT eval  Dequavius Kuhner  06/19/2011, 1:04 PM

## 2011-06-19 NOTE — Progress Notes (Signed)
   CARE MANAGEMENT NOTE 06/19/2011  Patient:  Ethan Lawrence, Ethan Lawrence   Account Number:  [id number]  Date Initiated:  06/19/2011  Documentation initiated by:  Letha Cape  Subjective/Objective Assessment:   dx anemia  admit- lives with spouse.     Action/Plan:   pt eval.   Anticipated DC Date:  06/21/2011   Anticipated DC Plan:  HOME W HOME HEALTH SERVICES      DC Planning Services  CM consult      Choice offered to / List presented to:             Status of service:  In process, will continue to follow Medicare Important Message given?   (If response is "NO", the following Medicare IM given date fields will be blank) Date Medicare IM given:   Date Additional Medicare IM given:    Discharge Disposition:    Per UR Regulation:    If discussed at Long Length of Stay Meetings, dates discussed:    Comments:  06/19/11 16:56 Letha Cape RN, BSN [phone] patient lives with spouse, await pt eval.

## 2011-06-19 NOTE — Anesthesia Postprocedure Evaluation (Signed)
  Anesthesia Post-op Note  Patient: Ethan Lawrence  Procedure(s) Performed: Procedure(s) (LRB): ESOPHAGOGASTRODUODENOSCOPY (EGD) (N/A) COLONOSCOPY (N/A)  Patient Location: PACU  Anesthesia Type: General  Level of Consciousness: awake  Airway and Oxygen Therapy: Patient Spontanous Breathing  Post-op Pain: mild  Post-op Assessment: Post-op Vital signs reviewed  Post-op Vital Signs: Reviewed  Complications: No apparent anesthesia complications

## 2011-06-19 NOTE — H&P (Signed)
Interval H and P note for endoscopic procedures. Patient to have EGD and Colonoscopy for anemia and heme positive stool. No change in history or physical from H and P.

## 2011-06-19 NOTE — Preoperative (Signed)
Beta Blockers   Reason not to administer Beta Blockers:Not Applicable 

## 2011-06-19 NOTE — Op Note (Signed)
[name] [address] [place], [place]  [address]  ENDOSCOPY PROCEDURE REPORT  PATIENT:  Ethan Lawrence, Ethan Lawrence  MR#:  [id number] BIRTHDATE:  1922/05/02, 89 yrs. old  GENDER:  male  ENDOSCOPIST:  Wandalee Ferdinand, MD Referred by:  PROCEDURE DATE:  06/19/2011 PROCEDURE: EGD ASA CLASS: 3 INDICATIONS: Anemia, heme positive stool  MEDICATIONS: Procedure was done in the operating room with anesthesia providing sedation  TOPICAL ANESTHETIC:  DESCRIPTION OF PROCEDURE:   After the risks benefits and alternatives of the procedure were thoroughly explained, informed consent was obtained.  The Pentax Gastroscope [id number] endoscope was introduced through the mouth and advanced to the second portion of the duodenum, without limitations.  The instrument was slowly withdrawn as the mucosa was fully examined. <<PROCEDUREIMAGES>>  FINDINGS: Esophagus: Normal  Stomach: Normal mucosa with postoperative findings of distal gastrectomy. The anastomosis site looked normal.  Duodenum: Normal in the second portion and bulb  COMPLICATIONS: None  ENDOSCOPIC IMPRESSION: Normal postoperative EGD. No evidence of a lesion to explain anemia  RECOMMENDATIONS: I would recommend PPI therapy, and iron supplementation with clinical observation to look for further drops in hemoglobin and hematocrit.  ______________________________ Wandalee Ferdinand, MD  CC:  n. eSIGNED:   Sam Briyonna Omara at 06/19/2011 05:09 PM  [name], [phone]

## 2011-06-19 NOTE — Progress Notes (Signed)
Utilization review completed.  

## 2011-06-19 NOTE — Op Note (Signed)
[name] [address] [place], [place]  [address]  COLONOSCOPY PROCEDURE REPORT  PATIENT:  Ethan Lawrence, Ethan Lawrence  MR#:  [id number] BIRTHDATE:  [birth year], 89 yrs. old  GENDER:  male ENDOSCOPIST:  Wandalee Ferdinand, MD REF. BY: PROCEDURE DATE:  06/19/2011 PROCEDURE: Colonoscopy ASA CLASS: 3 INDICATIONS: Anemia heme positive stool MEDICATIONS: Procedure done in OR with anesthesia providing sedation  DESCRIPTION OF PROCEDURE:   After the risks benefits and alternatives of the procedure were thoroughly explained, informed consent was obtained. Digital rectal exam was normal. The endoscope was introduced through the anus and advanced to the cecum , without limitations.  The quality of the prep was fair with some stool coating portions of the colon especially on the right side . The instrument was then slowly withdrawn as the colon was fully examined. <<PROCEDUREIMAGES>>  FINDINGS:  Diverticulosis of the sigmoid and descending colon.  Small internal hemorrhoids  No other lesions seen  COMPLICATIONS:  None ENDOSCOPIC IMPRESSION: See above  RECOMMENDATIONS: There is nothing specifically on this examination to explain anemia.I would recommend supplemental iron therapy and observation with followups of hemoglobins in the future  ______________________________ Wandalee Ferdinand, MD  CC:  n. eSIGNED:   Sam Merlyn Bollen at 06/19/2011 05:14 PM  [name], [phone]

## 2011-06-19 NOTE — Anesthesia Preprocedure Evaluation (Addendum)
Anesthesia Evaluation  Patient identified by MRN, date of birth, ID band Patient awake    Reviewed: Allergy & Precautions, H&P , NPO status , [name] Chart, lab work & pertinent test results, reviewed documented beta blocker date and time   Airway Mallampati: II  Neck ROM: full    Dental  (+) Edentulous Upper and Dental Advisory Given   Pulmonary former smoker Quit smoking 40 + years ago         Cardiovascular hypertension, + CAD, + Past MI and + CABG + dysrhythmias Atrial Fibrillation     Neuro/Psych    GI/Hepatic   Endo/Other    Renal/GU Renal InsufficiencyRenal disease     Musculoskeletal   Abdominal   Peds  Hematology   Anesthesia Other Findings   Reproductive/Obstetrics                         Anesthesia Physical Anesthesia Plan  ASA: III  Anesthesia Plan: MAC   Post-op Pain Management:    Induction: Intravenous  Airway Management Planned: Simple Face Mask and Oral ETT  Additional Equipment:   Intra-op Plan:   Post-operative Plan:   Informed Consent: I have reviewed the patients History and Physical, chart, labs and discussed the procedure including the risks, benefits and alternatives for the proposed anesthesia with the patient or authorized representative who has indicated his/her understanding and acceptance.   Dental advisory given  Plan Discussed with: CRNA, Surgeon and Anesthesiologist  Anesthesia Plan Comments:        Anesthesia Quick Evaluation

## 2011-06-20 LAB — BASIC METABOLIC PANEL
BUN: 19 mg/dL (ref 6–23)
CO2: 23 mEq/L (ref 19–32)
Chloride: 104 mEq/L (ref 96–112)
GFR calc Af Amer: 43 mL/min — ABNORMAL LOW (ref >90)
Glucose, Bld: 118 mg/dL — ABNORMAL HIGH (ref 70–99)
Potassium: 3.8 mEq/L (ref 3.5–5.1)

## 2011-06-20 LAB — TYPE AND SCREEN
ABO/RH(D): O NEG
Unit division: 0

## 2011-06-20 LAB — CBC
HCT: 25 % — ABNORMAL LOW (ref 39.0–52.0)
Hemoglobin: 7.8 g/dL — ABNORMAL LOW (ref 13.0–17.0)
RBC: 2.84 MIL/uL — ABNORMAL LOW (ref 4.22–5.81)
RDW: 14.8 % (ref 11.5–15.5)
WBC: 5.2 10*3/uL (ref 4.0–10.5)

## 2011-06-20 MED ORDER — ROPINIROLE HCL 2 MG PO TABS: 2.0000 mg | ORAL_TABLET | Freq: Every day | ORAL | Status: DC

## 2011-06-20 MED ORDER — SIMVASTATIN 20 MG PO TABS
20.0000 mg | ORAL_TABLET | Freq: Every day | ORAL | Status: DC
  Filled 2011-06-20: qty 1

## 2011-06-20 MED ORDER — PANTOPRAZOLE SODIUM 40 MG PO TBEC: 40.0000 mg | DELAYED_RELEASE_TABLET | Freq: Every day | ORAL | Status: DC

## 2011-06-20 MED ORDER — WARFARIN - PHYSICIAN DOSING INPATIENT: Freq: Every day | Status: DC

## 2011-06-20 MED ORDER — FERROUS SULFATE 325 (65 FE) MG PO TABS: 325.0000 mg | ORAL_TABLET | Freq: Two times a day (BID) | ORAL | Status: DC

## 2011-06-20 MED ORDER — WARFARIN SODIUM 5 MG PO TABS
5.0000 mg | ORAL_TABLET | Freq: Every day | ORAL | Status: DC
  Filled 2011-06-20: qty 1

## 2011-06-20 MED ORDER — GABAPENTIN 300 MG PO CAPS: 300.0000 mg | ORAL_CAPSULE | Freq: Every day | ORAL | Status: DC

## 2011-06-20 NOTE — Progress Notes (Signed)
PT  Cancellation Note     Treatment cancelled today due to pt beginning to get ready for D/C and does not want to do anything before leaving.   06/20/2011  [place] Bing, PT [phone] (pager)

## 2011-06-20 NOTE — Progress Notes (Signed)
[facility] Lanius to be D/C'd Home per MD order.  Discussed prescriptions and follow up appointments with the patient. Prescriptions given to patient, medication list explained in detail. Pt verbalized understanding.   Brylan, Dec  Home Medication Instructions AVW:[id number]   Printed on:06/20/11 1151  Medication Information                    predniSONE (DELTASONE) 5 MG tablet Take 5 mg by mouth daily.            warfarin (COUMADIN) 5 MG tablet Take 5 mg by mouth as directed.            folic acid (FOLVITE) 1 MG tablet Take 1 mg by mouth daily.            simvastatin (ZOCOR) 20 MG tablet Take 20 mg by mouth at bedtime.            Vitamin D, Ergocalciferol, (DRISDOL) 50000 UNITS CAPS Take 50,000 Units by mouth every 7 (seven) days.            ferrous sulfate 325 (65 FE) MG tablet Take 1 tablet (325 mg total) by mouth 2 (two) times daily.           gabapentin (NEURONTIN) 300 MG capsule Take 1 capsule (300 mg total) by mouth at bedtime.           rOPINIRole (REQUIP) 2 MG tablet Take 1 tablet (2 mg total) by mouth at bedtime.           pantoprazole (PROTONIX) 40 MG tablet Take 1 tablet (40 mg total) by mouth daily at 12 noon.             Filed Vitals:   06/20/11 0500  BP: 98/63  Pulse: 67  Temp: 98.3 F (36.8 C)  Resp: 20    Skin clean, dry and intact without evidence of skin break down, no evidence of skin tears noted. IV catheter discontinued intact. Site without signs and symptoms of complications. Dressing and pressure applied. Pt denies pain at this time. No complaints noted.  An After Visit Summary was printed and given to the patient. Patient escorted via WC, and D/C home via private auto.  Driggers, Rae Roam 06/20/2011 11:51 AM

## 2011-06-20 NOTE — Discharge Summary (Signed)
PATIENT DETAILS Name: Ethan Lawrence Age: 78 y.o. Sex: male Date of Birth: 03-04-1923 MRN: [id number]. Admit Date: 06/15/2011 Admitting Physician: Orbie Hurst, MD PCP:VAN Dinah Beers, MD, MD  PRIMARY DISCHARGE DIAGNOSIS:  Principal Problem:  *Anemia Active Problems:  History of AAA (abdominal aortic aneurysm) repair  GI bleed  Back pain, chronic  Arthritis  Chronic a-fib on Coumadin  CAD (coronary artery disease) s/p CABG  HTN (hypertension)      PAST MEDICAL HISTORY: Past Medical History  Diagnosis Date  . Hypertension   . Hyperlipidemia   . CAD (coronary artery disease)   . Atrial fib/flutter, transient   . Myocardial infarction     DISCHARGE MEDICATIONS: Medication List  As of 06/20/2011 10:18 AM   STOP taking these medications         CELEBREX 200 MG capsule      celecoxib 100 MG capsule      diazepam 10 MG tablet      furosemide 20 MG tablet      HYDROcodone-acetaminophen 10-500 MG per tablet      predniSONE 10 MG tablet      triamterene-hydrochlorothiazide 37.5-25 MG per tablet         TAKE these medications         ferrous sulfate 325 (65 FE) MG tablet   Take 1 tablet (325 mg total) by mouth 2 (two) times daily.      folic acid 1 MG tablet   Commonly known as: FOLVITE   Take 1 mg by mouth daily.      gabapentin 300 MG capsule   Commonly known as: NEURONTIN   Take 1 capsule (300 mg total) by mouth at bedtime.      pantoprazole 40 MG tablet   Commonly known as: PROTONIX   Take 1 tablet (40 mg total) by mouth daily at 12 noon.      predniSONE 5 MG tablet   Commonly known as: DELTASONE   Take 5 mg by mouth daily.      rOPINIRole 2 MG tablet   Commonly known as: REQUIP   Take 1 tablet (2 mg total) by mouth at bedtime.      simvastatin 20 MG tablet   Commonly known as: ZOCOR   Take 20 mg by mouth at bedtime.      Vitamin D (Ergocalciferol) 50000 UNITS Caps   Commonly known as: DRISDOL   Take 50,000 Units by mouth every 7  (seven) days.      warfarin 5 MG tablet   Commonly known as: COUMADIN   Take 5 mg by mouth as directed.             BRIEF HPI:  See H&P, Labs, Consult and Test reports for all details in brief, He presented to the ER for leg pain and found to be anemia with heme pos stools. He is on Coumadin for a-fib. He noted dark stool but thought they were due to Iron which he was started on recently. He was told by PCP stool would get dark and therefore was not alarmed by the change. He takes prednisone, hydrocodone and celebrex for back pain radiating to legs. He is 40 years status post surgery for a perforated ulcer, during which time they apparently removed "half of his stomach." No recent h/o ulcers. He has lost 35 lbs in 1 yr due to poor appetite. He drinks ensure but "not like I'm supposed to".   CONSULTATIONS:   GI  PERTINENT RADIOLOGIC STUDIES: Ct  Abdomen Pelvis Wo Contrast  06/16/2011  *RADIOLOGY REPORT*  Clinical Data: Anemia, rectal bleeding, bilateral leg cramps, abdominal aortic aneurysm with endovascular stent  CT ABDOMEN AND PELVIS WITHOUT CONTRAST  Technique:  Multidetector CT imaging of the abdomen and pelvis was performed following the standard protocol without intravenous contrast.  Comparison: 10/29/2010  Findings: Mild dependent atelectasis at the left lung base. Healing left posterolateral 9th rib fracture (series 5/image 12).  Numerous surgical clips in the left upper abdomen.  Numerous hepatic and splenic calcified granulomata.  Pancreas and adrenal glands within normal limits.  Status post cholecystectomy.  No intrahepatic or extrahepatic ductal dilatation.  Bilateral renal atrophy with stable bilateral renal cysts.  No hydronephrosis.  No evidence of bowel obstruction.  Colonic diverticulosis, without associated inflammatory changes. No colonic mass/wall thickening.  5.1 x 4.9 cm infrarenal abdominal aortic aneurysm (series 3/image 50), mildly decreased.  Indwelling aortobi-iliac  stent. Atherosclerotic calcifications of the aorta and branch vessels.  No abdominopelvic ascites.  No evidence of retroperitoneal hematoma.  No suspicious abdominopelvic lymphadenopathy.  Prostate is notable for prior TURP defect.  Bladder is within normal limits.  Degenerative changes of the visualized thoracolumbar spine. Moderate superior endplate compression deformity at L4 with prior vertebral augmentation, unchanged.  IMPRESSION: 5.1 x 4.9 cm infrarenal abdominal aortic aneurysm, mildly decreased.  Indwelling.  aortobi-iliac stent.  No evidence of retroperitoneal hemorrhage.  No evidence of bowel obstruction.  No colonic mass/wall thickening.  Stable moderate compression deformity at L4 with prior vertebral augmentation.  Additional ancillary findings as above.  Original Report Authenticated By: Charline Bills, M.D.   Dg Chest 1 View  06/16/2011  *RADIOLOGY REPORT*  Clinical Data: Leg cramps.  Prior aneurysm.  Prior back surgery. Hypertension.  Coronary artery disease.  CHEST - 1 VIEW  Comparison: 10/29/2010  Findings: Tortuous thoracic aorta noted with prior CABG and numerous clips in the upper abdomen.  There is evidence of old granulomatous disease.  Suspected chronic rotator cuff tears noted bilaterally.  IMPRESSION:  1.  Old granulomatous disease. 2.  Prior CABG. 3.  Tortuous thoracic aorta. 4.  Suspected bilateral chronic rotator cuff tears.  Original Report Authenticated By: Dellia Cloud, M.D.     PERTINENT LAB RESULTS: CBC:  Basename 06/20/11 0500 06/18/11 1745  WBC 5.2 7.6  HGB 7.8* 9.0*  HCT 25.0* 28.7*  PLT 156 200   CMET CMP     Component Value Date/Time   NA 138 06/18/2011 1745   K 4.3 06/18/2011 1745   CL 103 06/18/2011 1745   CO2 26 06/18/2011 1745   GLUCOSE 93 06/18/2011 1745   BUN 22 06/18/2011 1745   CREATININE 1.58* 06/18/2011 1745   CALCIUM 9.4 06/18/2011 1745   PROT 6.2 06/18/2011 1745   ALBUMIN 3.5 06/18/2011 1745   AST 14 06/18/2011 1745   ALT 9 06/18/2011 1745    ALKPHOS 89 06/18/2011 1745   BILITOT 0.5 06/18/2011 1745   GFRNONAA 38* 06/18/2011 1745   GFRAA 44* 06/18/2011 1745    GFR Estimated Creatinine Clearance: 34 ml/min (by C-G formula based on Cr of 1.58). No results found for this basename: LIPASE:2,AMYLASE:2 in the last 72 hours No results found for this basename: CKTOTAL:3,CKMB:3,CKMBINDEX:3,TROPONINI:3 in the last 72 hours No components found with this basename: POCBNP:3 No results found for this basename: DDIMER:2 in the last 72 hours No results found for this basename: HGBA1C:2 in the last 72 hours No results found for this basename: CHOL:2,HDL:2,LDLCALC:2,TRIG:2,CHOLHDL:2,LDLDIRECT:2 in the last 72 hours No results found for  this basename: TSH,T4TOTAL,FREET3,T3FREE,THYROIDAB in the last 72 hours No results found for this basename: VITAMINB12:2,FOLATE:2,FERRITIN:2,TIBC:2,IRON:2,RETICCTPCT:2 in the last 72 hours Coags: No results found for this basename: PT:2,INR:2 in the last 72 hours Microbiology: Recent Results (from the past 240 hour(s))  MRSA PCR SCREENING     Status: Normal   Collection Time   06/16/11 12:56 PM      Component Value Range Status Comment   MRSA by PCR NEGATIVE  NEGATIVE  Final      BRIEF HOSPITAL COURSE:   Principal Problem: *Anemia  hgb 6.4 on admission despite taking iron as oupt  S/p 2 units PRBC transfusion Hemoglobin 7.8 today, was 9.0 yesterday and also 8.3 yesterday as well. -Since patient not bleeding, likely slight drop today reflecting body equilibrating. -Patient has been seen by physical therapy services, he is currently not short of breath on having chest pain it is likely that he is asymptomatic from this anemia.  -I will place him on iron supplementation and discharge him home. He has a primary care practitioner in [place], does not have one in town in [place], however his granddaughter is a physician and I have had a detailed discussion with her on the phone, she will make sure that the  patient gets his repeat CBC within a week and subsequently thereafter.  GI bleed  hemoccult positive  Started on Protonix drip on admission, transition to oral Protonix now. coumadin held on admission - inr reversed on admission. EGD and colonoscopy did not reveal any etiology of bleeding, I did speak with Dr. Evette Cristal, it is okay from his point of view if the Coumadin is resumed on discharge. Question of capsule endoscopy was brought up, however given the patient's age and the fact that he has stopped bleeding this has not been pursued during this admission.  Back pain, chronic  On Narcotics at home, apparently he has been very drowsy with the medications and as a result of these medications have been discontinued. For now we will encourage patient to continue with Tylenol and continue with current medication of Neurontin and Requip.  Chronic a-fib on Coumadin  INR reversed on on admission-given GI bleed -Since EGD and colonoscopy are unrevealing, he has been cleared by GI and restart Coumadin. -As noted above I did speak with the patient's granddaughter who is a physician, she will make sure that the patient gets his repeat INR in 3 days' time. -Cardiologist is Dr Katrinka Blazing -from [facility], patient and daughter claims that she can call and make an appointment with the Coumadin clinic there.  History of AAA (abdominal aortic aneurysm) repair -Will defer further surveillance in the outpatient setting.  Arthritis  Continue with prednisone Prednisone  Celebrex-on hold on admission and will not be resumed on discharge.  Hypertension -He currently controlled without the need for further antihypertensive medications. I discussed this again with the patient's granddaughter will keep an eye on the patient's blood pressure and resume some of his medications as needed.  CAD (coronary artery disease) s/p CABG -Resume statins. -We'll continue Coumadin and avoid aspirin given recent GI  bleed.   TODAY-DAY OF DISCHARGE:  Subjective:   Ethan Lawrence today has no headache,no chest abdominal pain,no new weakness tingling or numbness, feels much better wants to go home today. He has had no further melena or hematochezia.  Objective:   Blood pressure 98/63, pulse 67, temperature 98.3 F (36.8 C), temperature source Oral, resp. rate 20, height 5\' 10"  (1.778 m), weight 78.1 kg (172  lb 2.9 oz), SpO2 96.00%.  Intake/Output Summary (Last 24 hours) at 06/20/11 1019 Last data filed at 06/20/11 0800  Gross per 24 hour  Intake   1905 ml  Output    675 ml  Net   1230 ml    Exam Awake Alert, Oriented *3, No new F.N deficits, Normal affect [place].AT,PERRAL Supple Neck,No JVD, No cervical lymphadenopathy appriciated.  Symmetrical Chest wall movement, Good air movement bilaterally, CTAB RRR,No Gallops,Rubs or new Murmurs, No Parasternal Heave +ve B.Sounds, Abd Soft, Non tender, No organomegaly appriciated, No rebound -guarding or rigidity. No Cyanosis, Clubbing or edema, No new Rash or bruise  DISPOSITION: Home   DISCHARGE INSTRUCTIONS:    Follow-up Information    Follow up with VAN Dinah Beers, MD. (keep appointment)    Contact information:   [address] [place] [address] [phone]       Schedule an appointment as soon as possible for a visit with Lesleigh Noe, MD.   Contact information:   [address] [place] [address] [phone]       Please follow up. (Follow up with PCP in [place] in 1-2 weeks)         Total Time spent on discharge equals 45 minutes.  SignedJeoffrey Massed 06/20/2011 10:19 AM

## 2011-06-20 NOTE — Progress Notes (Signed)
   CARE MANAGEMENT NOTE 06/20/2011  Patient:  Ethan Lawrence, Ethan Lawrence   Account Number:  [id number]  Date Initiated:  06/19/2011  Documentation initiated by:  Letha Cape  Subjective/Objective Assessment:   dx anemia  admit- lives with spouse.     Action/Plan:   pt eval.- no pt f/u.   Anticipated DC Date:  06/20/2011   Anticipated DC Plan:  HOME W HOME HEALTH SERVICES      DC Planning Services  CM consult      Choice offered to / List presented to:             Status of service:  Completed, signed off Medicare Important Message given?   (If response is "NO", the following Medicare IM given date fields will be blank) Date Medicare IM given:   Date Additional Medicare IM given:    Discharge Disposition:  HOME/SELF CARE  Per UR Regulation:    If discussed at Long Length of Stay Meetings, dates discussed:    Comments:  06/20/11 12:59 Letha Cape RN, BSN [phone] patient for dc today, per physical therpay patient has no needs.  06/19/11 16:56 Letha Cape RN, BSN [phone] patient lives with spouse, await pt eval.

## 2011-06-20 NOTE — Progress Notes (Signed)
1478 Pt had 10 beat run of V. Tach. Patients HR 143.  At 0600 patient's VS WNL.  Contacted Dr. Onalee Hua no orders given and no questions asked.  Pt complained of headache.  Will give pain medication and continue to monitor. Ethan Lawrence

## 2011-06-21 ENCOUNTER — Encounter (HOSPITAL_COMMUNITY): Payer: Self-pay | Admitting: Gastroenterology

## 2011-07-16 ENCOUNTER — Other Ambulatory Visit: Payer: Self-pay | Admitting: *Deleted

## 2011-07-16 DIAGNOSIS — I714 Abdominal aortic aneurysm, without rupture: Secondary | ICD-10-CM

## 2011-07-16 DIAGNOSIS — Z48812 Encounter for surgical aftercare following surgery on the circulatory system: Secondary | ICD-10-CM

## 2011-08-12 ENCOUNTER — Ambulatory Visit: Payer: Medicare PPO | Admitting: Surgery

## 2011-08-26 ENCOUNTER — Other Ambulatory Visit: Payer: Medicare PPO

## 2011-08-26 ENCOUNTER — Ambulatory Visit: Payer: Medicare PPO | Admitting: Surgery

## 2011-08-30 ENCOUNTER — Encounter: Payer: Self-pay | Admitting: Surgery

## 2011-09-02 ENCOUNTER — Ambulatory Visit (INDEPENDENT_AMBULATORY_CARE_PROVIDER_SITE_OTHER): Payer: Medicare PPO | Admitting: Surgery

## 2011-09-02 ENCOUNTER — Ambulatory Visit
Admission: RE | Admit: 2011-09-02 | Discharge: 2011-09-02 | Disposition: A | Discharge status: Home / Self Care | Payer: Medicare PPO | Source: Ambulatory Visit | Attending: Neurosurgery | Admitting: Neurosurgery

## 2011-09-02 ENCOUNTER — Encounter: Payer: Self-pay | Admitting: Surgery

## 2011-09-02 VITALS — BP 125/96 | HR 91 | Temp 98.3°F | Ht 70.0 in | Wt 166.0 lb

## 2011-09-02 DIAGNOSIS — Z48812 Encounter for surgical aftercare following surgery on the circulatory system: Secondary | ICD-10-CM

## 2011-09-02 DIAGNOSIS — T82330A Leakage of aortic (bifurcation) graft (replacement), initial encounter: Secondary | ICD-10-CM

## 2011-09-02 DIAGNOSIS — I714 Abdominal aortic aneurysm, without rupture, unspecified: Secondary | ICD-10-CM

## 2011-09-02 DIAGNOSIS — T82598A Other mechanical complication of other cardiac and vascular devices and implants, initial encounter: Secondary | ICD-10-CM

## 2011-09-02 MED ORDER — IOHEXOL 350 MG/ML SOLN
80.0000 mL | Freq: Once | INTRAVENOUS | Status: AC | PRN
  Administered 2011-09-02: 80 mL via INTRAVENOUS

## 2011-09-02 NOTE — Progress Notes (Signed)
Vascular and Vein Specialist of [facility]   Patient name: Ethan Lawrence MRN: [id number] DOB: 06/14/1922 Sex: male     Chief Complaint  Patient presents with  . AAA    6 month f/u CTA    HISTORY OF PRESENT ILLNESS: The patient is back today for followup of his aneurysm. He is status post endovascular repair done percutaneously on 07/05/2010 using a Gore Excluder device. His initial CT scan shows an aneurysm of 5.8 cm. This has decreased to 5.5 cm on additional imaging. 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 He is to today without complaints. He denies abdominal or back pain.  The patient was recently hospitalized for what he describes as internal bleeding.  Past Medical History  Diagnosis Date  . Hypertension   . Hyperlipidemia   . CAD (coronary artery disease)   . Atrial fib/flutter, transient   . Myocardial infarction     Past Surgical History  Procedure Date  . Endovascular stent insertion 07/05/2010  . Appendectomy   . Coronary artery bypass graft   . Vascular surgery   . Back surgery   . Cardiac catheterization   . Esophagogastroduodenoscopy 06/19/2011    Procedure: ESOPHAGOGASTRODUODENOSCOPY (EGD);  Surgeon: Graylin Shiver, MD;  Location: [facility] OR;  Service: Gastroenterology;  Laterality: N/A;  . Colonoscopy 06/19/2011    Procedure: COLONOSCOPY;  Surgeon: Graylin Shiver, MD;  Location: [facility] OR;  Service: Gastroenterology;  Laterality: N/A;    History   Social History  . Marital Status: Married    Spouse Name: N/A    Number of Children: N/A  . Years of Education: N/A   Occupational History  . Not on file.   Social History Main Topics  . Smoking status: Former Smoker    Quit date: 10/29/1975  . Smokeless tobacco: Never Used  . Alcohol Use: No  . Drug Use: No  . Sexually Active: Not on file   Other Topics Concern  . Not on file   Social History Narrative  . No narrative on file    Family History  Problem Relation  Age of Onset  . Coronary artery disease Brother     Allergies as of 09/02/2011  . (No Known Allergies)    Current Outpatient Prescriptions on File Prior to Visit  Medication Sig Dispense Refill  . ferrous sulfate 325 (65 FE) MG tablet Take 1 tablet (325 mg total) by mouth 2 (two) times daily.  60 tablet  0  . folic acid (FOLVITE) 1 MG tablet Take 1 mg by mouth daily.       Marland Kitchen gabapentin (NEURONTIN) 300 MG capsule Take 1 capsule (300 mg total) by mouth at bedtime.  30 capsule  0  . pantoprazole (PROTONIX) 40 MG tablet Take 1 tablet (40 mg total) by mouth daily at 12 noon.  30 tablet  0  . predniSONE (DELTASONE) 5 MG tablet Take 5 mg by mouth daily.       Marland Kitchen rOPINIRole (REQUIP) 2 MG tablet Take 1 tablet (2 mg total) by mouth at bedtime.  30 tablet  0  . simvastatin (ZOCOR) 20 MG tablet Take 20 mg by mouth at bedtime.       . Vitamin D, Ergocalciferol, (DRISDOL) 50000 UNITS CAPS Take 50,000 Units by mouth every 7 (seven) days.       Marland Kitchen warfarin (COUMADIN) 5 MG tablet Take 5 mg by mouth as directed.        Current Facility-Administered Medications  on File Prior to Visit  Medication Dose Route Frequency Provider Last Rate Last Dose  . iohexol (OMNIPAQUE) 350 MG/ML injection 80 mL  80 mL Intravenous Once PRN Simonne Come, MD   80 mL at 09/02/11 1515     REVIEW OF SYSTEMS: Please see history of present illness, otherwise negative  PHYSICAL EXAMINATION:   Vital signs are BP 125/96  Pulse 91  Temp 98.3 F (36.8 C) (Oral)  Ht 5\' 10"  (1.778 m)  Wt 166 lb (75.297 kg)  BMI 23.82 kg/m2 General: The patient appears their stated age. HEENT:  No gross abnormalities Pulmonary:  Non labored breathing Abdomen: Soft and non-tender I have reviewed his CT angiogram. This shows a stable size of his aneurysm, measuring 5.5 cm. This is similar to one year ago. There is a persistent type II endoleak Musculoskeletal: There are no major deformities. Neurologic: No focal weakness or paresthesias are  detected, Skin: There are no ulcer or rashes noted. Psychiatric: The patient has normal affect. Cardiovascular: There is a regular rate and rhythm without significant murmur appreciated.   Diagnostic Studies I have reviewed his CT angiogram. There has been no change in the size of his aneurysm over the past one year. Continues to measure 5.5 cm. There is a persistent type II endoleak.   Assessment: Status post endovascular aneurysm repair with type II endoleak  Plan: I again discussed the findings of an endoleak with the patient. We discussed that since there has been no change in the size of his aneurysm and every year I will continue to monitor this. I will see him back in 6 months with a repeat ultrasound. He understands that he may require an intervention should his aneurysm starts to increase in size. Also understands that he is at risk for rupture while he has a patent endoleak.  Jorge Ny, M.D. [facility] Specialists of [place] Office: [phone] Pager:  [phone]

## 2011-09-02 NOTE — Addendum Note (Signed)
Addended by: Sharee Pimple on: 09/02/2011 04:28 PM   Modules accepted: Orders

## 2012-03-09 ENCOUNTER — Ambulatory Visit: Payer: Medicare PPO | Admitting: Surgery

## 2012-03-09 ENCOUNTER — Other Ambulatory Visit: Payer: Medicare PPO

## 2012-03-23 ENCOUNTER — Other Ambulatory Visit: Payer: Medicare PPO

## 2012-03-23 ENCOUNTER — Ambulatory Visit: Payer: Medicare PPO | Admitting: Surgery

## 2012-04-20 ENCOUNTER — Ambulatory Visit: Payer: Medicare PPO | Admitting: Surgery

## 2012-08-09 DIAGNOSIS — I442 Atrioventricular block, complete: Secondary | ICD-10-CM

## 2012-08-09 HISTORY — DX: Atrioventricular block, complete: I44.2

## 2012-08-10 ENCOUNTER — Other Ambulatory Visit: Payer: Self-pay | Admitting: Interventional Cardiology

## 2012-08-18 ENCOUNTER — Inpatient Hospital Stay (HOSPITAL_BASED_OUTPATIENT_CLINIC_OR_DEPARTMENT_OTHER)
Admission: RE | Admit: 2012-08-18 | Discharge: 2012-08-18 | Disposition: A | Discharge status: Home / Self Care | Payer: Medicare PPO | Source: Ambulatory Visit | Attending: Interventional Cardiology | Admitting: Interventional Cardiology

## 2012-08-18 ENCOUNTER — Encounter (HOSPITAL_BASED_OUTPATIENT_CLINIC_OR_DEPARTMENT_OTHER)
Admission: RE | Disposition: A | Discharge status: Home / Self Care | Payer: Self-pay | Source: Ambulatory Visit | Attending: Interventional Cardiology

## 2012-08-18 DIAGNOSIS — I2789 Other specified pulmonary heart diseases: Secondary | ICD-10-CM | POA: Insufficient documentation

## 2012-08-18 DIAGNOSIS — I2510 Atherosclerotic heart disease of native coronary artery without angina pectoris: Secondary | ICD-10-CM

## 2012-08-18 DIAGNOSIS — I359 Nonrheumatic aortic valve disorder, unspecified: Secondary | ICD-10-CM | POA: Insufficient documentation

## 2012-08-18 DIAGNOSIS — I25810 Atherosclerosis of coronary artery bypass graft(s) without angina pectoris: Secondary | ICD-10-CM | POA: Insufficient documentation

## 2012-08-18 DIAGNOSIS — N184 Chronic kidney disease, stage 4 (severe): Secondary | ICD-10-CM | POA: Insufficient documentation

## 2012-08-18 DIAGNOSIS — I2582 Chronic total occlusion of coronary artery: Secondary | ICD-10-CM | POA: Insufficient documentation

## 2012-08-18 DIAGNOSIS — I350 Nonrheumatic aortic (valve) stenosis: Secondary | ICD-10-CM

## 2012-08-18 LAB — POCT I-STAT 3, ART BLOOD GAS (G3+)
Bicarbonate: 22.6 mEq/L (ref 20.0–24.0)
pH, Arterial: 7.326 — ABNORMAL LOW (ref 7.350–7.450)
pO2, Arterial: 128 mmHg — ABNORMAL HIGH (ref 80.0–100.0)

## 2012-08-18 LAB — POCT I-STAT 3, VENOUS BLOOD GAS (G3P V)
pCO2, Ven: 50 mmHg (ref 45.0–50.0)
pH, Ven: 7.259 (ref 7.250–7.300)

## 2012-08-18 SURGERY — JV LEFT AND RIGHT HEART CATHETERIZATION WITH CORONARY ANGIOGRAM
Anesthesia: Moderate Sedation

## 2012-08-18 MED ORDER — ACETAMINOPHEN 325 MG PO TABS: 650.0000 mg | ORAL_TABLET | ORAL | Status: DC | PRN

## 2012-08-18 MED ORDER — DIAZEPAM 5 MG PO TABS
5.0000 mg | ORAL_TABLET | ORAL | Status: AC
  Administered 2012-08-18: 5 mg via ORAL

## 2012-08-18 MED ORDER — ONDANSETRON HCL 4 MG/2ML IJ SOLN: 4.0000 mg | Freq: Four times a day (QID) | INTRAMUSCULAR | Status: DC | PRN

## 2012-08-18 MED ORDER — SODIUM CHLORIDE 0.9 % IV SOLN: INTRAVENOUS | Status: DC

## 2012-08-18 MED ORDER — ASPIRIN 81 MG PO CHEW
324.0000 mg | CHEWABLE_TABLET | ORAL | Status: AC
  Administered 2012-08-18: 324 mg via ORAL

## 2012-08-18 MED ORDER — SODIUM CHLORIDE 0.9 % IJ SOLN: 3.0000 mL | Freq: Two times a day (BID) | INTRAMUSCULAR | Status: DC

## 2012-08-18 MED ORDER — SODIUM CHLORIDE 0.9 % IJ SOLN: 3.0000 mL | INTRAMUSCULAR | Status: DC | PRN

## 2012-08-18 MED ORDER — SODIUM CHLORIDE 0.9 % IV SOLN: 250.0000 mL | INTRAVENOUS | Status: DC | PRN

## 2012-08-18 NOTE — H&P (Addendum)
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 He has had progressive weakness and fatigue and a recent echo demonstrated critical aortic stenosis. He is undergone prior coronary grafting. He has stage IV chronic kidney disease. He is desiring an aggressive approach to his valvular disease. He is here today to undergo diagnostic catheterization before referral to the valvular heart clinic for consideration of TAVR.  Because of a precath creatinine of 1.8, we have aggressively hydrated with normal saline at 200 cc an hour, receiving 1 L prior to being brought into the lab.  The patient understands the risks of stroke, death, acute kidney failure, limb ischemia, bleeding, among other risks and is willing to proceed. He has been hydrated for several hours prior to the procedure this morning.

## 2012-08-18 NOTE — OR Nursing (Signed)
Meal served 

## 2012-08-18 NOTE — OR Nursing (Signed)
Dr Smith at bedside to discuss results and treatment plan with pt and family 

## 2012-08-18 NOTE — CV Procedure (Signed)
     Diagnostic Cardiac Catheterization Report  Ethan Lawrence  79 y.o.  male 07-29-1922  Procedure Date:  08/18/2012 Referring Physician:  Kerin Perna, MD Primary Cardiologist:: HWBSmith, III, MD   PROCEDURE:  Left heart catheterization with selective coronary angiography, left ventriculogram.  INDICATIONS:  Critical AS by echo with heart failure symptoms  The risks, benefits, and details of the procedure were explained to the patient.  The patient verbalized understanding and wanted to proceed.  Informed written consent was obtained.  PROCEDURE TECHNIQUE:  After Xylocaine anesthesia a 4 French arterial and 7 French venous sheath was placed in the right femoral region with single anterior wall sticks.   Coronary angiography and bypass graft angiography was done using a 4 cm 4 French left and right Judkins, IMA, and A2 MPcatheters.  Left ventriculography was not performed. We crossed the aortic valve into the left ventricle using the internal mammary artery catheter and a standard length straight guidewire. A pullback pressure was recorded. Right heart pressures were measured as we advanced the Swan-Ganz into the pulmonary artery. A main pulmonary artery saturation was determined. We obtained and aortic saturation in this referral valvular position. We use a left Judkins for left coronary angiography, the right Judkins for right coronary angiography, and the A2 MP for saphenous vein graft angiography. The internal mammary artery catheter was used for left IMA angiography.  No complications occurred.   CONTRAST:  Total of 75 cc.  COMPLICATIONS:  None.    HEMODYNAMICS:  Aortic pressure was 148/71 mmHg; LV pressure was 179/22 mmHg; LVEDP 22 mm mercury; right atrial mean 10 mm mercury; right ventricular 49/21 mmHg; PA pressure 48/15 mmHg; capillary wedge mean 18 mm mercury; Fick cardiac output 4.3 L per minute; cardiac index 2.3 L/ minute/m2 ; peak to peak Ao Valve gradient 31  mmHg.; mean Ao Valve gradient 24 mm mercury; Aortic valve area 0.92 cm square    ANGIOGRAPHIC DATA:   The left main coronary artery is 40% proximal narrowing. Heavy calcification noted..  The left anterior descending artery is patent with 70-80% narrowing in the mid vessel where the distal vessel is seen to have competitive flow due to the patent left internal mammary graft.  The ramus intermedius is patent with a mid vessel 80% stenosis at the site of tenting from previous graft insertion to  The left circumflex artery patent with occlusion of dominant obtuse marginal.  The right coronary artery is totally occluded in the mid vessel.  BYPASS GRAFT ANGIOGRAPHY:  LIMA to LAD: Widely patent  SVG  to ramus intermedius: Totally occluded  Saphenous vein graft to obtuse marginal: Widely patent  Saphenous vein graft to the right coronary: Widely patent  LEFT VENTRICULOGRAM:  Left ventricular angiogram was not performed in an effort to minimize contrast exposure. Recent echo LVEF greater than 50%.  IMPRESSIONS:  1. Severe aortic stenosis with calculated valve area 0.9 cm square  2. Totally occluded native right coronary and first obtuse marginal. High-grade disease in the mid LAD and ramus intermedius.  3. Patent left internal mammary to the LAD, SVG to the obtuse marginal, and SVG to the RCA.  4. Total occlusion of the SVG to the ramus intermedius. The ramus native is patent as noted above  5. Moderate pulmonary hypertension   RECOMMENDATION:  Will refer to Dr. Kathlee Nations Trigt. The termination of the patient is a surgical versus TAVR candidate.

## 2012-08-18 NOTE — OR Nursing (Signed)
+  Allen's test right hand 

## 2012-08-18 NOTE — OR Nursing (Signed)
Tegaderm dressing applied, site level 0, bedrest begins at 1410

## 2012-08-20 ENCOUNTER — Encounter: Payer: Self-pay | Admitting: Cardiothoracic Surgery

## 2012-08-20 ENCOUNTER — Other Ambulatory Visit: Payer: Self-pay | Admitting: *Deleted

## 2012-08-20 ENCOUNTER — Institutional Professional Consult (permissible substitution) (INDEPENDENT_AMBULATORY_CARE_PROVIDER_SITE_OTHER): Payer: Medicare PPO | Admitting: Cardiothoracic Surgery

## 2012-08-20 VITALS — BP 96/65 | HR 100 | Resp 20 | Ht 70.0 in | Wt 166.0 lb

## 2012-08-20 DIAGNOSIS — I350 Nonrheumatic aortic (valve) stenosis: Secondary | ICD-10-CM

## 2012-08-20 DIAGNOSIS — I359 Nonrheumatic aortic valve disorder, unspecified: Secondary | ICD-10-CM

## 2012-08-20 DIAGNOSIS — I071 Rheumatic tricuspid insufficiency: Secondary | ICD-10-CM

## 2012-08-20 NOTE — Patient Instructions (Signed)
Stop Coumadin before surgery last dose June 21

## 2012-08-20 NOTE — Progress Notes (Signed)
PCP is VAN Dinah Beers, MD Referring Provider is Lesleigh Noe, MD  Chief Complaint  Patient presents with  . Aortic Stenosis    Surgical eval for severe aortic stenosis, ECHO 07/27/12, Cardiac Cath 08/18/12   history and physical  HPI: 79 year old Caucasian male presents for evaluation of critical aortic stenosis documented by cath and 2-D echo. He is status post CABG x4   8 years ago and has patent grafts by recent cath. However his peak gradient of 70 mm mercury with a valve area 0.85. He has extreme shortness of breath with exercise. He has episodes of lightheadedness and has lost approximately 40 pounds in last 2 years. He also has a history recent ankle edema which improved on Lasix. He is referred for evaluation for aortic valve replacement for severe AS. He has had a progressive decline in his exercise tolerance and functional ability over the past 3 months  Echocardiogram also demonstrates moderate to severe TR with PA pressures by right heart cath and 45/25. He is in atrial fibrillation on Coumadin and cardiac output is 4.3 L per minute.  He did well after his multivessel bypass grafting at age 82.  2 years ago he underwent a bifurcated stent graft for infrarenal 6 cm AAA and is followed by Dr. Myra Gianotti  His past dental history includes history of partial gastrectomy 40 years ago for ulcer disease. Recently he has had iron deficiency anemia requiring transfusion every 6 months. Upper and lower endoscopy were unremarkable.  His baseline creatinine is 1.6  Past Medical History  Diagnosis Date  . Hypertension   . Hyperlipidemia   . CAD (coronary artery disease)   . Atrial fib/flutter, transient   . Myocardial infarction     Past Surgical History  Procedure Laterality Date  . Endovascular stent insertion  07/05/2010  . Appendectomy    . Coronary artery bypass graft    . Vascular surgery    . Back surgery    . Cardiac catheterization    . Esophagogastroduodenoscopy   06/19/2011    Procedure: ESOPHAGOGASTRODUODENOSCOPY (EGD);  Surgeon: Graylin Shiver, MD;  Location: [facility] OR;  Service: Gastroenterology;  Laterality: N/A;  . Colonoscopy  06/19/2011    Procedure: COLONOSCOPY;  Surgeon: Graylin Shiver, MD;  Location: [facility] OR;  Service: Gastroenterology;  Laterality: N/A;    Family History  Problem Relation Age of Onset  . Coronary artery disease Brother     Social History History  Substance Use Topics  . Smoking status: Former Smoker    Quit date: 10/29/1975  . Smokeless tobacco: Never Used  . Alcohol Use: No    Current Outpatient Prescriptions  Medication Sig Dispense Refill  . folic acid (FOLVITE) 1 MG tablet Take 1 mg by mouth daily.       . furosemide (LASIX) 20 MG tablet Take 20 mg by mouth daily. PRN      . gabapentin (NEURONTIN) 300 MG capsule Take 300 mg by mouth 2 (two) times daily.      Marland Kitchen HYDROcodone-acetaminophen (LORTAB) 10-500 MG per tablet Take 10 tablets by mouth as needed.      . multivitamin-lutein (OCUVITE-LUTEIN) CAPS Take 1 capsule by mouth daily.      Marland Kitchen omeprazole (PRILOSEC) 20 MG capsule Take 20 mg by mouth daily.      . predniSONE (DELTASONE) 5 MG tablet Take 5 mg by mouth daily.       Marland Kitchen rOPINIRole (REQUIP) 2 MG tablet Take 1 tablet (2 mg total) by  mouth at bedtime.  30 tablet  0  . simvastatin (ZOCOR) 20 MG tablet Take 20 mg by mouth at bedtime.       . traMADol (ULTRAM) 50 MG tablet Take 50 mg by mouth daily. 1/2 tablet in AM, 1/2 at dinner, 1/2 at bedtime      . warfarin (COUMADIN) 5 MG tablet Take 5 mg by mouth as directed.        No current facility-administered medications for this visit.    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 His daughter lives in [place] He has no active dental disease in his upper dental plates His vein was  harvested endoscopically from the right leg and his cardiac cath shows a patent LIMA, patent saphenous vein graft to the marginal and patent saphenous vein graft to the posterior descending. By echo EF was 60%  BP 96/65  Pulse 100  Resp 20  Ht 5\' 10"  (1.778 m)  Wt 166 lb (75.297 kg)  BMI 23.82 kg/m2  SpO2 97% Physical Exam General alert and comfortable walks well down the hallway HEENT normocephalic pupils equal Neck without JVD mass or bruit Lymphatics no palpable adenopathy the neck or supralevator fossa Breath sounds clear bilaterally Well-healed sternal incision Grade 3/6 systolic ejection murmur right sternal border, atrial fibrillation rhythm Abdomen well-healed midline scar from previous hemigastrectomy no pulsatile mass Extremities without clubbing cyanosis or edema Good peripheral pulses present Neurologically intact, walks with a limp do to a bad left knee from arthritis  Diagnostic Tests: Coronary care gram reviewed showing patent grafts 2-D echo reviewed showing good LV function some RV dilatation and moderate TR with severe aortic stenosis and a heavily calcified valve  Impression: Severe aortic stenosis class IV symptoms patient needs aVR  Plan: Aortic valve replacement to redo sternotomy and possible tricuspid valve repair discussed with patient. I also presented the possibility of a percutaneous valve replacement which he is not interested in  He understands he is high risk for redo sternotomy for aVR and possible tricuspid valve repair due to his advanced age his anemia is peripheral vascular disease and his moderate pulmonary hypertension  Surgery scheduled for June 24

## 2012-08-24 ENCOUNTER — Encounter (HOSPITAL_COMMUNITY): Payer: Self-pay | Admitting: Pharmacy Technician

## 2012-08-27 ENCOUNTER — Encounter (HOSPITAL_COMMUNITY): Payer: Self-pay

## 2012-08-27 ENCOUNTER — Ambulatory Visit (HOSPITAL_COMMUNITY)
Admission: RE | Admit: 2012-08-27 | Discharge: 2012-08-27 | Disposition: A | Discharge status: Home / Self Care | Payer: Medicare PPO | Source: Ambulatory Visit | Attending: Cardiothoracic Surgery | Admitting: Cardiothoracic Surgery

## 2012-08-27 ENCOUNTER — Inpatient Hospital Stay (HOSPITAL_COMMUNITY)
Admission: RE | Admit: 2012-08-27 | Discharge: 2012-08-27 | Disposition: A | Discharge status: Home / Self Care | Payer: Medicare PPO | Source: Ambulatory Visit | Attending: Cardiothoracic Surgery | Admitting: Cardiothoracic Surgery

## 2012-08-27 ENCOUNTER — Encounter (HOSPITAL_COMMUNITY)
Admission: RE | Admit: 2012-08-27 | Discharge: 2012-08-27 | Disposition: A | Discharge status: Home / Self Care | Payer: Medicare PPO | Source: Ambulatory Visit | Attending: Cardiothoracic Surgery | Admitting: Cardiothoracic Surgery

## 2012-08-27 VITALS — BP 107/70 | HR 82 | Temp 98.0°F | Resp 20 | Ht 69.0 in | Wt 168.0 lb

## 2012-08-27 DIAGNOSIS — Z01812 Encounter for preprocedural laboratory examination: Secondary | ICD-10-CM | POA: Insufficient documentation

## 2012-08-27 DIAGNOSIS — Z01810 Encounter for preprocedural cardiovascular examination: Secondary | ICD-10-CM | POA: Insufficient documentation

## 2012-08-27 DIAGNOSIS — I2510 Atherosclerotic heart disease of native coronary artery without angina pectoris: Secondary | ICD-10-CM

## 2012-08-27 DIAGNOSIS — I071 Rheumatic tricuspid insufficiency: Secondary | ICD-10-CM

## 2012-08-27 DIAGNOSIS — Z01811 Encounter for preprocedural respiratory examination: Secondary | ICD-10-CM | POA: Insufficient documentation

## 2012-08-27 DIAGNOSIS — I359 Nonrheumatic aortic valve disorder, unspecified: Secondary | ICD-10-CM

## 2012-08-27 DIAGNOSIS — I079 Rheumatic tricuspid valve disease, unspecified: Secondary | ICD-10-CM | POA: Insufficient documentation

## 2012-08-27 DIAGNOSIS — Z01818 Encounter for other preprocedural examination: Secondary | ICD-10-CM | POA: Insufficient documentation

## 2012-08-27 HISTORY — DX: Unspecified macular degeneration: H35.30

## 2012-08-27 HISTORY — DX: Other complications of anesthesia, initial encounter: T88.59XA

## 2012-08-27 HISTORY — DX: Adverse effect of unspecified anesthetic, initial encounter: T41.45XA

## 2012-08-27 LAB — URINALYSIS, ROUTINE W REFLEX MICROSCOPIC
Bilirubin Urine: NEGATIVE
Glucose, UA: NEGATIVE mg/dL
Hgb urine dipstick: NEGATIVE
Ketones, ur: NEGATIVE mg/dL
Nitrite: NEGATIVE
Protein, ur: NEGATIVE mg/dL
Specific Gravity, Urine: 1.018 (ref 1.005–1.030)
Urobilinogen, UA: 1 mg/dL (ref 0.0–1.0)
pH: 5.5 (ref 5.0–8.0)

## 2012-08-27 LAB — CBC
HCT: 34.6 % — ABNORMAL LOW (ref 39.0–52.0)
Hemoglobin: 10.3 g/dL — ABNORMAL LOW (ref 13.0–17.0)
MCH: 24.2 pg — ABNORMAL LOW (ref 26.0–34.0)
MCHC: 29.8 g/dL — ABNORMAL LOW (ref 30.0–36.0)
MCV: 81.4 fL (ref 78.0–100.0)
Platelets: 134 10*3/uL — ABNORMAL LOW (ref 150–400)
RBC: 4.25 MIL/uL (ref 4.22–5.81)
RDW: 18.1 % — ABNORMAL HIGH (ref 11.5–15.5)
WBC: 5.7 10*3/uL (ref 4.0–10.5)

## 2012-08-27 LAB — COMPREHENSIVE METABOLIC PANEL
ALT: 9 U/L (ref 0–53)
AST: 15 U/L (ref 0–37)
Albumin: 3.3 g/dL — ABNORMAL LOW (ref 3.5–5.2)
Alkaline Phosphatase: 127 U/L — ABNORMAL HIGH (ref 39–117)
BUN: 26 mg/dL — ABNORMAL HIGH (ref 6–23)
CO2: 19 mEq/L (ref 19–32)
Calcium: 8.4 mg/dL (ref 8.4–10.5)
Chloride: 104 mEq/L (ref 96–112)
Creatinine, Ser: 1.58 mg/dL — ABNORMAL HIGH (ref 0.50–1.35)
GFR calc Af Amer: 43 mL/min — ABNORMAL LOW (ref >90)
GFR calc non Af Amer: 37 mL/min — ABNORMAL LOW (ref >90)
Glucose, Bld: 108 mg/dL — ABNORMAL HIGH (ref 70–99)
Potassium: 4.2 mEq/L (ref 3.5–5.1)
Sodium: 135 mEq/L (ref 135–145)
Total Bilirubin: 0.3 mg/dL (ref 0.3–1.2)
Total Protein: 6.4 g/dL (ref 6.0–8.3)

## 2012-08-27 LAB — BLOOD GAS, ARTERIAL
Acid-base deficit: 3.7 mmol/L — ABNORMAL HIGH (ref 0.0–2.0)
Bicarbonate: 20.9 mEq/L (ref 20.0–24.0)
Drawn by: 206361
FIO2: 0.21 %
O2 Saturation: 97.5 %
Patient temperature: 98.6
TCO2: 22.1 mmol/L (ref 0–100)
pCO2 arterial: 38.4 mmHg (ref 35.0–45.0)
pH, Arterial: 7.356 (ref 7.350–7.450)
pO2, Arterial: 97.4 mmHg (ref 80.0–100.0)

## 2012-08-27 LAB — PROTIME-INR
INR: 1.14 (ref 0.00–1.49)
Prothrombin Time: 14.4 seconds (ref 11.6–15.2)

## 2012-08-27 LAB — PULMONARY FUNCTION TEST

## 2012-08-27 LAB — SURGICAL PCR SCREEN
MRSA, PCR: NEGATIVE
Staphylococcus aureus: POSITIVE — AB

## 2012-08-27 LAB — HEMOGLOBIN A1C
Hgb A1c MFr Bld: 5.7 % — ABNORMAL HIGH (ref <5.7)
Mean Plasma Glucose: 117 mg/dL — ABNORMAL HIGH (ref <117)

## 2012-08-27 LAB — URINE MICROSCOPIC-ADD ON

## 2012-08-27 LAB — APTT: aPTT: 34 seconds (ref 24–37)

## 2012-08-27 MED ORDER — CHLORHEXIDINE GLUCONATE 4 % EX LIQD: 30.0000 mL | CUTANEOUS | Status: DC

## 2012-08-27 NOTE — Progress Notes (Signed)
VASCULAR LAB PRELIMINARY  PRELIMINARY  PRELIMINARY  PRELIMINARY  Pre-op Cardiac Surgery  Carotid Findings:  Bilateral:  Less than 39% ICA stenosis.  Vertebral artery flow is antegrade.      Upper Extremity Right Left  Brachial Pressures 117 triphasic 111 triphasic  Radial Waveforms triphasic monophasic  Ulnar Waveforms triphasic monophasic  Palmar Arch (Allen's Test) WNL WNL   Findings:  Doppler waveforms remain normal with ulnar and radial compressions bilaterally.    Lynk Marti, RVT 08/27/2012, 11:56 AM

## 2012-08-27 NOTE — Pre-Procedure Instructions (Signed)
LEANDRO BERKOWITZ  08/27/2012   Your procedure is scheduled on:  September 01, 2012  Report to [facility] Short Stay Center at 5:30 AM.  Call this number if you have problems the morning of surgery: [phone]   Remember:   Do not eat food or drink liquids after midnight.   Take these medicines the morning of surgery with A SIP OF WATER: pain pill as needed,  omeprazole (PRILOSEC) 20 MG capsule, gabapentin (NEURONTIN) 300 MG capsule   Do not wear jewelry, make-up or nail polish.  Do not wear lotions, powders, or perfumes. You may wear deodorant.  Do not shave 48 hours prior to surgery. Men may shave face and neck.  Do not bring valuables to the hospital.  [facility] is not responsible                   for any belongings or valuables.  Contacts, dentures or bridgework may not be worn into surgery.  Leave suitcase in the car. After surgery it may be brought to your room.  For patients admitted to the hospital, checkout time is 11:00 AM the day of  discharge.   Patients discharged the day of surgery will not be allowed to drive  home.  Name and phone number of your driver: family/friend  Special Instructions: Shower using CHG 2 nights before surgery and the night before surgery.  If you shower the day of surgery use CHG.  Use special wash - you have one bottle of CHG for all showers.  You should use approximately 1/3 of the bottle for each shower.   Please read over the following fact sheets that you were given: Pain Booklet, Coughing and Deep Breathing, Blood Transfusion Information and Surgical Site Infection Prevention

## 2012-08-31 MED ORDER — SODIUM CHLORIDE 0.9 % IV SOLN
INTRAVENOUS | Status: AC
  Administered 2012-09-01: 14 mL/h via INTRAVENOUS
  Filled 2012-08-31: qty 40

## 2012-08-31 MED ORDER — NITROGLYCERIN IN D5W 200-5 MCG/ML-% IV SOLN
2.0000 ug/min | INTRAVENOUS | Status: DC
  Filled 2012-08-31: qty 250

## 2012-08-31 MED ORDER — DEXMEDETOMIDINE HCL IN NACL 400 MCG/100ML IV SOLN
0.1000 ug/kg/h | INTRAVENOUS | Status: AC
  Administered 2012-09-01: 0.2 ug/kg/h via INTRAVENOUS
  Filled 2012-08-31: qty 100

## 2012-08-31 MED ORDER — DOPAMINE-DEXTROSE 3.2-5 MG/ML-% IV SOLN
2.0000 ug/kg/min | INTRAVENOUS | Status: AC
  Administered 2012-09-01: 3 ug/min via INTRAVENOUS
  Filled 2012-08-31: qty 250

## 2012-08-31 MED ORDER — DEXTROSE 5 % IV SOLN
750.0000 mg | INTRAVENOUS | Status: DC
  Filled 2012-08-31: qty 750

## 2012-08-31 MED ORDER — POTASSIUM CHLORIDE 2 MEQ/ML IV SOLN
80.0000 meq | INTRAVENOUS | Status: DC
  Filled 2012-08-31: qty 40

## 2012-08-31 MED ORDER — VANCOMYCIN HCL 10 G IV SOLR
1250.0000 mg | INTRAVENOUS | Status: AC
  Administered 2012-09-01: 1250 mg via INTRAVENOUS
  Filled 2012-08-31 (×2): qty 1250

## 2012-08-31 MED ORDER — PHENYLEPHRINE HCL 10 MG/ML IJ SOLN
30.0000 ug/min | INTRAVENOUS | Status: AC
  Administered 2012-09-01: 50 ug/min via INTRAVENOUS
  Filled 2012-08-31: qty 2

## 2012-08-31 MED ORDER — MAGNESIUM SULFATE 50 % IJ SOLN
40.0000 meq | INTRAMUSCULAR | Status: DC
  Filled 2012-08-31: qty 10

## 2012-08-31 MED ORDER — SODIUM CHLORIDE 0.9 % IV SOLN
INTRAVENOUS | Status: AC
  Administered 2012-09-01: .8 [IU]/h via INTRAVENOUS
  Filled 2012-08-31: qty 1

## 2012-08-31 MED ORDER — SODIUM CHLORIDE 0.9 % IV SOLN
INTRAVENOUS | Status: DC
  Filled 2012-08-31: qty 30

## 2012-08-31 MED ORDER — DEXTROSE 5 % IV SOLN
1.5000 g | INTRAVENOUS | Status: AC
  Administered 2012-09-01: 1.5 g via INTRAVENOUS
  Administered 2012-09-01: .75 g via INTRAVENOUS
  Filled 2012-08-31 (×2): qty 1.5

## 2012-08-31 MED ORDER — PLASMA-LYTE 148 IV SOLN
INTRAVENOUS | Status: AC
  Administered 2012-09-01: 09:00:00
  Filled 2012-08-31: qty 2.5

## 2012-08-31 MED ORDER — EPINEPHRINE HCL 1 MG/ML IJ SOLN
0.5000 ug/min | INTRAVENOUS | Status: DC
  Filled 2012-08-31: qty 4

## 2012-08-31 MED ORDER — METOPROLOL TARTRATE 12.5 MG HALF TABLET
12.5000 mg | ORAL_TABLET | Freq: Once | ORAL | Status: AC
  Administered 2012-09-01: 12.5 mg via ORAL
  Filled 2012-08-31: qty 1

## 2012-09-01 ENCOUNTER — Encounter (HOSPITAL_COMMUNITY)
Admission: RE | Disposition: A | Discharge status: Home Health | Payer: Self-pay | Source: Ambulatory Visit | Attending: Cardiothoracic Surgery

## 2012-09-01 ENCOUNTER — Encounter (HOSPITAL_COMMUNITY): Payer: Self-pay | Admitting: *Deleted

## 2012-09-01 ENCOUNTER — Inpatient Hospital Stay (HOSPITAL_COMMUNITY)
Admission: RE | Admit: 2012-09-01 | Discharge: 2012-09-13 | DRG: 220 | Disposition: A | Discharge status: Home Health | Payer: Medicare PPO | Source: Ambulatory Visit | Attending: Cardiothoracic Surgery | Admitting: Cardiothoracic Surgery

## 2012-09-01 ENCOUNTER — Encounter (HOSPITAL_COMMUNITY): Payer: Self-pay | Admitting: Certified Registered Nurse Anesthetist

## 2012-09-01 ENCOUNTER — Inpatient Hospital Stay (HOSPITAL_COMMUNITY): Payer: Medicare PPO | Admitting: Certified Registered Nurse Anesthetist

## 2012-09-01 ENCOUNTER — Inpatient Hospital Stay (HOSPITAL_COMMUNITY): Payer: Medicare PPO

## 2012-09-01 DIAGNOSIS — R29898 Other symptoms and signs involving the musculoskeletal system: Secondary | ICD-10-CM | POA: Diagnosis not present

## 2012-09-01 DIAGNOSIS — R5381 Other malaise: Secondary | ICD-10-CM | POA: Diagnosis not present

## 2012-09-01 DIAGNOSIS — I079 Rheumatic tricuspid valve disease, unspecified: Secondary | ICD-10-CM

## 2012-09-01 DIAGNOSIS — S1093XA Contusion of unspecified part of neck, initial encounter: Secondary | ICD-10-CM | POA: Diagnosis not present

## 2012-09-01 DIAGNOSIS — Z9089 Acquired absence of other organs: Secondary | ICD-10-CM

## 2012-09-01 DIAGNOSIS — F05 Delirium due to known physiological condition: Secondary | ICD-10-CM | POA: Diagnosis not present

## 2012-09-01 DIAGNOSIS — R471 Dysarthria and anarthria: Secondary | ICD-10-CM | POA: Diagnosis not present

## 2012-09-01 DIAGNOSIS — D62 Acute posthemorrhagic anemia: Secondary | ICD-10-CM | POA: Diagnosis not present

## 2012-09-01 DIAGNOSIS — R339 Retention of urine, unspecified: Secondary | ICD-10-CM | POA: Diagnosis not present

## 2012-09-01 DIAGNOSIS — D696 Thrombocytopenia, unspecified: Secondary | ICD-10-CM | POA: Diagnosis not present

## 2012-09-01 DIAGNOSIS — R29810 Facial weakness: Secondary | ICD-10-CM | POA: Diagnosis not present

## 2012-09-01 DIAGNOSIS — I359 Nonrheumatic aortic valve disorder, unspecified: Secondary | ICD-10-CM

## 2012-09-01 DIAGNOSIS — Y9210 Unspecified residential institution as the place of occurrence of the external cause: Secondary | ICD-10-CM | POA: Diagnosis not present

## 2012-09-01 DIAGNOSIS — Y93E8 Activity, other personal hygiene: Secondary | ICD-10-CM

## 2012-09-01 DIAGNOSIS — I4891 Unspecified atrial fibrillation: Secondary | ICD-10-CM | POA: Diagnosis not present

## 2012-09-01 DIAGNOSIS — I10 Essential (primary) hypertension: Secondary | ICD-10-CM | POA: Diagnosis present

## 2012-09-01 DIAGNOSIS — W010XXA Fall on same level from slipping, tripping and stumbling without subsequent striking against object, initial encounter: Secondary | ICD-10-CM | POA: Diagnosis not present

## 2012-09-01 DIAGNOSIS — Z87891 Personal history of nicotine dependence: Secondary | ICD-10-CM

## 2012-09-01 DIAGNOSIS — S01409A Unspecified open wound of unspecified cheek and temporomandibular area, initial encounter: Secondary | ICD-10-CM | POA: Diagnosis not present

## 2012-09-01 DIAGNOSIS — Z7901 Long term (current) use of anticoagulants: Secondary | ICD-10-CM

## 2012-09-01 DIAGNOSIS — I071 Rheumatic tricuspid insufficiency: Secondary | ICD-10-CM

## 2012-09-01 DIAGNOSIS — I442 Atrioventricular block, complete: Secondary | ICD-10-CM | POA: Diagnosis not present

## 2012-09-01 DIAGNOSIS — I4510 Unspecified right bundle-branch block: Secondary | ICD-10-CM | POA: Diagnosis present

## 2012-09-01 DIAGNOSIS — I482 Chronic atrial fibrillation, unspecified: Secondary | ICD-10-CM

## 2012-09-01 DIAGNOSIS — I2510 Atherosclerotic heart disease of native coronary artery without angina pectoris: Secondary | ICD-10-CM | POA: Diagnosis present

## 2012-09-01 DIAGNOSIS — I498 Other specified cardiac arrhythmias: Secondary | ICD-10-CM | POA: Diagnosis not present

## 2012-09-01 DIAGNOSIS — I25810 Atherosclerosis of coronary artery bypass graft(s) without angina pectoris: Secondary | ICD-10-CM | POA: Diagnosis present

## 2012-09-01 DIAGNOSIS — E785 Hyperlipidemia, unspecified: Secondary | ICD-10-CM | POA: Diagnosis present

## 2012-09-01 DIAGNOSIS — Z8249 Family history of ischemic heart disease and other diseases of the circulatory system: Secondary | ICD-10-CM

## 2012-09-01 DIAGNOSIS — I350 Nonrheumatic aortic (valve) stenosis: Secondary | ICD-10-CM | POA: Diagnosis present

## 2012-09-01 DIAGNOSIS — S0003XA Contusion of scalp, initial encounter: Secondary | ICD-10-CM | POA: Diagnosis not present

## 2012-09-01 DIAGNOSIS — J9383 Other pneumothorax: Secondary | ICD-10-CM | POA: Diagnosis not present

## 2012-09-01 DIAGNOSIS — E876 Hypokalemia: Secondary | ICD-10-CM | POA: Diagnosis not present

## 2012-09-01 HISTORY — DX: Atrioventricular block, complete: I44.2

## 2012-09-01 HISTORY — DX: Unspecified atrial fibrillation: I48.91

## 2012-09-01 HISTORY — DX: Abdominal aortic aneurysm, without rupture: I71.4

## 2012-09-01 HISTORY — DX: Other postprocedural complications and disorders of the circulatory system, not elsewhere classified: I97.89

## 2012-09-01 HISTORY — DX: Nonrheumatic aortic (valve) stenosis: I35.0

## 2012-09-01 HISTORY — PX: TRICUSPID VALVE REPLACEMENT: SHX816

## 2012-09-01 HISTORY — PX: AORTIC VALVE REPLACEMENT: SHX41

## 2012-09-01 HISTORY — PX: INTRAOPERATIVE TRANSESOPHAGEAL ECHOCARDIOGRAM: SHX5062

## 2012-09-01 HISTORY — DX: Abdominal aortic aneurysm, without rupture, unspecified: I71.40

## 2012-09-01 LAB — POCT I-STAT, CHEM 8
BUN: 22 mg/dL (ref 6–23)
Calcium, Ion: 1.22 mmol/L (ref 1.13–1.30)
Chloride: 109 mEq/L (ref 96–112)
Creatinine, Ser: 1.6 mg/dL — ABNORMAL HIGH (ref 0.50–1.35)
Glucose, Bld: 109 mg/dL — ABNORMAL HIGH (ref 70–99)
HCT: 32 % — ABNORMAL LOW (ref 39.0–52.0)
Hemoglobin: 10.9 g/dL — ABNORMAL LOW (ref 13.0–17.0)
Potassium: 4.4 mEq/L (ref 3.5–5.1)
Sodium: 140 mEq/L (ref 135–145)
TCO2: 19 mmol/L (ref 0–100)

## 2012-09-01 LAB — POCT I-STAT 4, (NA,K, GLUC, HGB,HCT)
Glucose, Bld: 87 mg/dL (ref 70–99)
Glucose, Bld: 99 mg/dL (ref 70–99)
Glucose, Bld: 102 mg/dL — ABNORMAL HIGH (ref 70–99)
Glucose, Bld: 103 mg/dL — ABNORMAL HIGH (ref 70–99)
Glucose, Bld: 133 mg/dL — ABNORMAL HIGH (ref 70–99)
Glucose, Bld: 169 mg/dL — ABNORMAL HIGH (ref 70–99)
Glucose, Bld: 167 mg/dL — ABNORMAL HIGH (ref 70–99)
Glucose, Bld: 118 mg/dL — ABNORMAL HIGH (ref 70–99)
HCT: 31 % — ABNORMAL LOW (ref 39.0–52.0)
HCT: 28 % — ABNORMAL LOW (ref 39.0–52.0)
HCT: 24 % — ABNORMAL LOW (ref 39.0–52.0)
HCT: 29 % — ABNORMAL LOW (ref 39.0–52.0)
HCT: 26 % — ABNORMAL LOW (ref 39.0–52.0)
HCT: 23 % — ABNORMAL LOW (ref 39.0–52.0)
HCT: 23 % — ABNORMAL LOW (ref 39.0–52.0)
HCT: 33 % — ABNORMAL LOW (ref 39.0–52.0)
Hemoglobin: 10.5 g/dL — ABNORMAL LOW (ref 13.0–17.0)
Hemoglobin: 9.5 g/dL — ABNORMAL LOW (ref 13.0–17.0)
Hemoglobin: 8.2 g/dL — ABNORMAL LOW (ref 13.0–17.0)
Hemoglobin: 9.9 g/dL — ABNORMAL LOW (ref 13.0–17.0)
Hemoglobin: 8.8 g/dL — ABNORMAL LOW (ref 13.0–17.0)
Hemoglobin: 7.8 g/dL — ABNORMAL LOW (ref 13.0–17.0)
Hemoglobin: 7.8 g/dL — ABNORMAL LOW (ref 13.0–17.0)
Hemoglobin: 11.2 g/dL — ABNORMAL LOW (ref 13.0–17.0)
Potassium: 4.2 mEq/L (ref 3.5–5.1)
Potassium: 4.4 mEq/L (ref 3.5–5.1)
Potassium: 4.1 mEq/L (ref 3.5–5.1)
Potassium: 4.3 mEq/L (ref 3.5–5.1)
Potassium: 5.2 mEq/L — ABNORMAL HIGH (ref 3.5–5.1)
Potassium: 5.6 mEq/L — ABNORMAL HIGH (ref 3.5–5.1)
Potassium: 4.9 mEq/L (ref 3.5–5.1)
Potassium: 4.7 meq/L (ref 3.5–5.1)
Sodium: 142 mEq/L (ref 135–145)
Sodium: 141 mEq/L (ref 135–145)
Sodium: 136 mEq/L (ref 135–145)
Sodium: 141 mEq/L (ref 135–145)
Sodium: 138 mEq/L (ref 135–145)
Sodium: 134 mEq/L — ABNORMAL LOW (ref 135–145)
Sodium: 140 mEq/L (ref 135–145)
Sodium: 140 meq/L (ref 135–145)

## 2012-09-01 LAB — POCT I-STAT 3, ART BLOOD GAS (G3+)
Acid-base deficit: 1 mmol/L (ref 0.0–2.0)
Acid-base deficit: 6 mmol/L — ABNORMAL HIGH (ref 0.0–2.0)
Acid-base deficit: 4 mmol/L — ABNORMAL HIGH (ref 0.0–2.0)
Bicarbonate: 25 mEq/L — ABNORMAL HIGH (ref 20.0–24.0)
Bicarbonate: 20.1 mEq/L (ref 20.0–24.0)
Bicarbonate: 22.3 meq/L (ref 20.0–24.0)
O2 Saturation: 100 %
O2 Saturation: 98 %
O2 Saturation: 82 %
Patient temperature: 37.1
TCO2: 26 mmol/L (ref 0–100)
TCO2: 21 mmol/L (ref 0–100)
TCO2: 24 mmol/L (ref 0–100)
pCO2 arterial: 47.7 mmHg — ABNORMAL HIGH (ref 35.0–45.0)
pCO2 arterial: 44.2 mmHg (ref 35.0–45.0)
pCO2 arterial: 42.5 mmHg (ref 35.0–45.0)
pH, Arterial: 7.328 — ABNORMAL LOW (ref 7.350–7.450)
pH, Arterial: 7.265 — ABNORMAL LOW (ref 7.350–7.450)
pH, Arterial: 7.328 — ABNORMAL LOW (ref 7.350–7.450)
pO2, Arterial: 368 mmHg — ABNORMAL HIGH (ref 80.0–100.0)
pO2, Arterial: 120 mmHg — ABNORMAL HIGH (ref 80.0–100.0)
pO2, Arterial: 50 mmHg — ABNORMAL LOW (ref 80.0–100.0)

## 2012-09-01 LAB — CBC
HCT: 33.7 % — ABNORMAL LOW (ref 39.0–52.0)
HCT: 32.4 % — ABNORMAL LOW (ref 39.0–52.0)
Hemoglobin: 10.4 g/dL — ABNORMAL LOW (ref 13.0–17.0)
Hemoglobin: 10.3 g/dL — ABNORMAL LOW (ref 13.0–17.0)
MCH: 25.6 pg — ABNORMAL LOW (ref 26.0–34.0)
MCHC: 30.9 g/dL (ref 30.0–36.0)
MCHC: 31.8 g/dL (ref 30.0–36.0)
MCV: 80.4 fL (ref 78.0–100.0)
Platelets: 102 10*3/uL — ABNORMAL LOW (ref 150–400)
RBC: 4.09 MIL/uL — ABNORMAL LOW (ref 4.22–5.81)
RBC: 4.03 MIL/uL — ABNORMAL LOW (ref 4.22–5.81)
RDW: 18.3 % — ABNORMAL HIGH (ref 11.5–15.5)
WBC: 14.5 10*3/uL — ABNORMAL HIGH (ref 4.0–10.5)

## 2012-09-01 LAB — POCT I-STAT 3, VENOUS BLOOD GAS (G3P V)
Acid-base deficit: 7 mmol/L — ABNORMAL HIGH (ref 0.0–2.0)
Bicarbonate: 19.4 mEq/L — ABNORMAL LOW (ref 20.0–24.0)
O2 Saturation: 50 %
TCO2: 21 mmol/L (ref 0–100)
pCO2, Ven: 42.4 mmHg — ABNORMAL LOW (ref 45.0–50.0)
pH, Ven: 7.27 (ref 7.250–7.300)
pO2, Ven: 30 mmHg (ref 30.0–45.0)

## 2012-09-01 LAB — CREATININE, SERUM
Creatinine, Ser: 1.46 mg/dL — ABNORMAL HIGH (ref 0.50–1.35)
GFR calc Af Amer: 48 mL/min — ABNORMAL LOW (ref >90)
GFR calc non Af Amer: 41 mL/min — ABNORMAL LOW (ref >90)

## 2012-09-01 LAB — HEMOGLOBIN AND HEMATOCRIT, BLOOD
HCT: 24.2 % — ABNORMAL LOW (ref 39.0–52.0)
Hemoglobin: 7.6 g/dL — ABNORMAL LOW (ref 13.0–17.0)

## 2012-09-01 LAB — PROTIME-INR
INR: 1.57 — ABNORMAL HIGH (ref 0.00–1.49)
Prothrombin Time: 18.3 seconds — ABNORMAL HIGH (ref 11.6–15.2)

## 2012-09-01 LAB — POCT I-STAT GLUCOSE
Glucose, Bld: 89 mg/dL (ref 70–99)
Operator id: 195151

## 2012-09-01 LAB — PREPARE RBC (CROSSMATCH)

## 2012-09-01 LAB — PLATELET COUNT: Platelets: 79 10*3/uL — ABNORMAL LOW (ref 150–400)

## 2012-09-01 LAB — MAGNESIUM: Magnesium: 2.2 mg/dL (ref 1.5–2.5)

## 2012-09-01 SURGERY — REDO AORTIC VALVE REPLACEMENT (AVR)
Anesthesia: General | Site: Chest | Wound class: Clean

## 2012-09-01 MED ORDER — NITROGLYCERIN IN D5W 200-5 MCG/ML-% IV SOLN: 0.0000 ug/min | INTRAVENOUS | Status: DC

## 2012-09-01 MED ORDER — LACTATED RINGERS IV SOLN
INTRAVENOUS | Status: DC | PRN
  Administered 2012-09-01 (×2): via INTRAVENOUS

## 2012-09-01 MED ORDER — METOPROLOL TARTRATE 25 MG/10 ML ORAL SUSPENSION
12.5000 mg | Freq: Two times a day (BID) | ORAL | Status: DC
  Filled 2012-09-01 (×3): qty 5

## 2012-09-01 MED ORDER — PANTOPRAZOLE SODIUM 40 MG PO TBEC
40.0000 mg | DELAYED_RELEASE_TABLET | Freq: Every day | ORAL | Status: DC
  Administered 2012-09-04 – 2012-09-12 (×9): 40 mg via ORAL
  Filled 2012-09-01 (×9): qty 1

## 2012-09-01 MED ORDER — SODIUM CHLORIDE 0.9 % IV SOLN
INTRAVENOUS | Status: DC
  Filled 2012-09-01: qty 1

## 2012-09-01 MED ORDER — ACETAMINOPHEN 160 MG/5ML PO SOLN
975.0000 mg | Freq: Four times a day (QID) | ORAL | Status: AC
  Administered 2012-09-02 (×2): 975 mg
  Filled 2012-09-01 (×2): qty 40.6

## 2012-09-01 MED ORDER — LACTATED RINGERS IV SOLN
INTRAVENOUS | Status: DC | PRN
  Administered 2012-09-01: 07:00:00 via INTRAVENOUS

## 2012-09-01 MED ORDER — MILRINONE IN DEXTROSE 20 MG/100ML IV SOLN
0.3000 ug/kg/min | INTRAVENOUS | Status: DC
  Administered 2012-09-01 – 2012-09-02 (×2): 0.3 ug/kg/min via INTRAVENOUS
  Filled 2012-09-01 (×3): qty 100

## 2012-09-01 MED ORDER — METOPROLOL TARTRATE 1 MG/ML IV SOLN: 2.5000 mg | INTRAVENOUS | Status: DC | PRN

## 2012-09-01 MED ORDER — HYDROCORTISONE SOD SUCCINATE 100 MG IJ SOLR
INTRAMUSCULAR | Status: DC | PRN
  Administered 2012-09-01: 100 mg via INTRAVENOUS

## 2012-09-01 MED ORDER — METOCLOPRAMIDE HCL 5 MG/ML IJ SOLN
10.0000 mg | Freq: Four times a day (QID) | INTRAMUSCULAR | Status: AC
  Administered 2012-09-01 – 2012-09-02 (×4): 10 mg via INTRAVENOUS
  Filled 2012-09-01 (×4): qty 2

## 2012-09-01 MED ORDER — ACETAMINOPHEN 10 MG/ML IV SOLN
1000.0000 mg | Freq: Once | INTRAVENOUS | Status: AC
Start: 2012-09-01 — End: 2012-09-01
  Administered 2012-09-01: 1000 mg via INTRAVENOUS
  Filled 2012-09-01 (×2): qty 100

## 2012-09-01 MED ORDER — CHLORHEXIDINE GLUCONATE 0.12 % MT SOLN
15.0000 mL | Freq: Two times a day (BID) | OROMUCOSAL | Status: DC
  Administered 2012-09-02 (×2): 15 mL via OROMUCOSAL
  Filled 2012-09-01 (×2): qty 15

## 2012-09-01 MED ORDER — SODIUM CHLORIDE 0.9 % IJ SOLN
3.0000 mL | Freq: Two times a day (BID) | INTRAMUSCULAR | Status: DC
  Administered 2012-09-02 – 2012-09-12 (×16): 3 mL via INTRAVENOUS

## 2012-09-01 MED ORDER — BISACODYL 5 MG PO TBEC
10.0000 mg | DELAYED_RELEASE_TABLET | Freq: Every day | ORAL | Status: DC
  Administered 2012-09-04 – 2012-09-12 (×4): 10 mg via ORAL
  Filled 2012-09-01 (×7): qty 2

## 2012-09-01 MED ORDER — CHLORHEXIDINE GLUCONATE 0.12 % MT SOLN
OROMUCOSAL | Status: AC
  Administered 2012-09-01: 15 mL via OROMUCOSAL
  Filled 2012-09-01: qty 15

## 2012-09-01 MED ORDER — MORPHINE SULFATE 2 MG/ML IJ SOLN
1.0000 mg | INTRAMUSCULAR | Status: AC | PRN
Start: 2012-09-01 — End: 2012-09-02
  Administered 2012-09-02: 4 mg via INTRAVENOUS

## 2012-09-01 MED ORDER — ACETAMINOPHEN 500 MG PO TABS
1000.0000 mg | ORAL_TABLET | Freq: Four times a day (QID) | ORAL | Status: AC
  Administered 2012-09-03 – 2012-09-06 (×11): 1000 mg via ORAL
  Filled 2012-09-01 (×18): qty 2

## 2012-09-01 MED ORDER — VECURONIUM BROMIDE 10 MG IV SOLR
INTRAVENOUS | Status: DC | PRN
  Administered 2012-09-01: 2 mg via INTRAVENOUS
  Administered 2012-09-01 (×2): 5 mg via INTRAVENOUS
  Administered 2012-09-01: 3 mg via INTRAVENOUS
  Administered 2012-09-01: 5 mg via INTRAVENOUS

## 2012-09-01 MED ORDER — ALBUMIN HUMAN 5 % IV SOLN
250.0000 mL | INTRAVENOUS | Status: AC | PRN
  Administered 2012-09-01 – 2012-09-02 (×4): 250 mL via INTRAVENOUS
  Filled 2012-09-01: qty 250

## 2012-09-01 MED ORDER — SODIUM CHLORIDE 0.45 % IV SOLN
INTRAVENOUS | Status: DC
  Administered 2012-09-01: 17:00:00 via INTRAVENOUS

## 2012-09-01 MED ORDER — SODIUM CHLORIDE 0.9 % IV SOLN
20.0000 ug | Freq: Once | INTRAVENOUS | Status: DC
  Filled 2012-09-01: qty 5

## 2012-09-01 MED ORDER — SODIUM CHLORIDE 0.9 % IV SOLN
20.0000 ug | INTRAVENOUS | Status: DC | PRN
  Administered 2012-09-01: 20 ug via INTRAVENOUS

## 2012-09-01 MED ORDER — MIDAZOLAM HCL 5 MG/5ML IJ SOLN
INTRAMUSCULAR | Status: DC | PRN
  Administered 2012-09-01: 3 mg via INTRAVENOUS
  Administered 2012-09-01: 2 mg via INTRAVENOUS
  Administered 2012-09-01: 4 mg via INTRAVENOUS
  Administered 2012-09-01: 1 mg via INTRAVENOUS

## 2012-09-01 MED ORDER — MILRINONE IN DEXTROSE 20 MG/100ML IV SOLN
INTRAVENOUS | Status: DC | PRN
  Administered 2012-09-01: .3 ug/kg/min via INTRAVENOUS

## 2012-09-01 MED ORDER — LACTATED RINGERS IV SOLN: INTRAVENOUS | Status: DC

## 2012-09-01 MED ORDER — MAGNESIUM SULFATE 40 MG/ML IJ SOLN
4.0000 g | Freq: Once | INTRAMUSCULAR | Status: AC
  Administered 2012-09-01: 2 g via INTRAVENOUS
  Filled 2012-09-01: qty 100

## 2012-09-01 MED ORDER — SODIUM CHLORIDE 0.9 % IJ SOLN: 3.0000 mL | INTRAMUSCULAR | Status: DC | PRN

## 2012-09-01 MED ORDER — SODIUM CHLORIDE 0.9 % IJ SOLN
OROMUCOSAL | Status: DC | PRN
  Administered 2012-09-01: 09:00:00 via TOPICAL

## 2012-09-01 MED ORDER — DOPAMINE-DEXTROSE 3.2-5 MG/ML-% IV SOLN: 2.0000 ug/kg/min | INTRAVENOUS | Status: DC

## 2012-09-01 MED ORDER — MIDAZOLAM HCL 2 MG/2ML IJ SOLN
2.0000 mg | INTRAMUSCULAR | Status: DC | PRN
  Administered 2012-09-02 (×3): 2 mg via INTRAVENOUS
  Filled 2012-09-01 (×2): qty 2
  Filled 2012-09-01: qty 4
  Filled 2012-09-01: qty 2

## 2012-09-01 MED ORDER — FAMOTIDINE IN NACL 20-0.9 MG/50ML-% IV SOLN
20.0000 mg | Freq: Two times a day (BID) | INTRAVENOUS | Status: AC
  Administered 2012-09-01 (×2): 20 mg via INTRAVENOUS
  Filled 2012-09-01: qty 50

## 2012-09-01 MED ORDER — ONDANSETRON HCL 4 MG/2ML IJ SOLN
4.0000 mg | Freq: Four times a day (QID) | INTRAMUSCULAR | Status: DC | PRN
  Administered 2012-09-09: 4 mg via INTRAVENOUS
  Filled 2012-09-01: qty 2

## 2012-09-01 MED ORDER — HEMOSTATIC AGENTS (NO CHARGE) OPTIME
TOPICAL | Status: DC | PRN
  Administered 2012-09-01: 1 via TOPICAL

## 2012-09-01 MED ORDER — PHENYLEPHRINE HCL 10 MG/ML IJ SOLN
0.0000 ug/min | INTRAVENOUS | Status: DC
  Administered 2012-09-02: 20 ug/min via INTRAVENOUS
  Filled 2012-09-01 (×2): qty 2

## 2012-09-01 MED ORDER — SODIUM CHLORIDE 0.9 % IV SOLN
5.0000 g/h | Freq: Once | INTRAVENOUS | Status: DC
  Filled 2012-09-01: qty 20

## 2012-09-01 MED ORDER — METOPROLOL TARTRATE 12.5 MG HALF TABLET
12.5000 mg | ORAL_TABLET | Freq: Two times a day (BID) | ORAL | Status: DC
  Filled 2012-09-01 (×3): qty 1

## 2012-09-01 MED ORDER — PROPOFOL 10 MG/ML IV BOLUS
INTRAVENOUS | Status: DC | PRN
  Administered 2012-09-01: 50 mg via INTRAVENOUS

## 2012-09-01 MED ORDER — SIMVASTATIN 20 MG PO TABS
20.0000 mg | ORAL_TABLET | Freq: Every day | ORAL | Status: DC
  Administered 2012-09-03 – 2012-09-12 (×10): 20 mg via ORAL
  Filled 2012-09-01 (×13): qty 1

## 2012-09-01 MED ORDER — BISACODYL 10 MG RE SUPP: 10.0000 mg | Freq: Every day | RECTAL | Status: DC

## 2012-09-01 MED ORDER — ASPIRIN 81 MG PO CHEW: 324.0000 mg | CHEWABLE_TABLET | Freq: Every day | ORAL | Status: DC

## 2012-09-01 MED ORDER — VANCOMYCIN HCL IN DEXTROSE 1-5 GM/200ML-% IV SOLN
1000.0000 mg | Freq: Two times a day (BID) | INTRAVENOUS | Status: AC
  Administered 2012-09-01 – 2012-09-02 (×3): 1000 mg via INTRAVENOUS
  Filled 2012-09-01 (×3): qty 200

## 2012-09-01 MED ORDER — ARTIFICIAL TEARS OP OINT
TOPICAL_OINTMENT | OPHTHALMIC | Status: DC | PRN
  Administered 2012-09-01: 1 via OPHTHALMIC

## 2012-09-01 MED ORDER — AMINOCAPROIC ACID 250 MG/ML IV SOLN
INTRAVENOUS | Status: DC | PRN
  Administered 2012-09-01: 5 g via INTRAVENOUS

## 2012-09-01 MED ORDER — GABAPENTIN 300 MG PO CAPS
300.0000 mg | ORAL_CAPSULE | Freq: Two times a day (BID) | ORAL | Status: DC
  Administered 2012-09-03 – 2012-09-12 (×19): 300 mg via ORAL
  Filled 2012-09-01 (×28): qty 1

## 2012-09-01 MED ORDER — SODIUM CHLORIDE 0.9 % IV SOLN
INTRAVENOUS | Status: DC
  Administered 2012-09-01 – 2012-09-04 (×2): via INTRAVENOUS

## 2012-09-01 MED ORDER — ROCURONIUM BROMIDE 100 MG/10ML IV SOLN
INTRAVENOUS | Status: DC | PRN
  Administered 2012-09-01: 50 mg via INTRAVENOUS
  Administered 2012-09-01: 100 mg via INTRAVENOUS

## 2012-09-01 MED ORDER — DOCUSATE SODIUM 100 MG PO CAPS
200.0000 mg | ORAL_CAPSULE | Freq: Every day | ORAL | Status: DC
  Administered 2012-09-04 – 2012-09-12 (×5): 200 mg via ORAL
  Filled 2012-09-01 (×8): qty 2

## 2012-09-01 MED ORDER — MORPHINE SULFATE 2 MG/ML IJ SOLN
2.0000 mg | INTRAMUSCULAR | Status: DC | PRN
  Administered 2012-09-02: 2 mg via INTRAVENOUS
  Administered 2012-09-02: 4 mg via INTRAVENOUS
  Filled 2012-09-01: qty 2
  Filled 2012-09-01: qty 1
  Filled 2012-09-01: qty 2

## 2012-09-01 MED ORDER — SODIUM CHLORIDE 0.9 % IV SOLN: 250.0000 mL | INTRAVENOUS | Status: DC

## 2012-09-01 MED ORDER — BIOTENE DRY MOUTH MT LIQD
15.0000 mL | Freq: Four times a day (QID) | OROMUCOSAL | Status: DC
  Administered 2012-09-02 – 2012-09-03 (×6): 15 mL via OROMUCOSAL

## 2012-09-01 MED ORDER — HEPARIN SODIUM (PORCINE) 1000 UNIT/ML IJ SOLN
INTRAMUSCULAR | Status: DC | PRN
  Administered 2012-09-01: 5000 [IU] via INTRAVENOUS
  Administered 2012-09-01: 18000 [IU] via INTRAVENOUS

## 2012-09-01 MED ORDER — ASPIRIN EC 325 MG PO TBEC
325.0000 mg | DELAYED_RELEASE_TABLET | Freq: Every day | ORAL | Status: DC
  Administered 2012-09-04 – 2012-09-12 (×9): 325 mg via ORAL
  Filled 2012-09-01 (×13): qty 1

## 2012-09-01 MED ORDER — PROTAMINE SULFATE 10 MG/ML IV SOLN
INTRAVENOUS | Status: DC | PRN
  Administered 2012-09-01 (×12): 20 mg via INTRAVENOUS

## 2012-09-01 MED ORDER — OXYCODONE HCL 5 MG PO TABS: 5.0000 mg | ORAL_TABLET | ORAL | Status: DC | PRN

## 2012-09-01 MED ORDER — DEXMEDETOMIDINE HCL IN NACL 200 MCG/50ML IV SOLN
0.1000 ug/kg/h | INTRAVENOUS | Status: DC
  Administered 2012-09-01 (×2): 0.7 ug/kg/h via INTRAVENOUS
  Administered 2012-09-02: 0.5 ug/kg/h via INTRAVENOUS
  Filled 2012-09-01 (×2): qty 50
  Filled 2012-09-01: qty 100

## 2012-09-01 MED ORDER — LIDOCAINE HCL (CARDIAC) 20 MG/ML IV SOLN
INTRAVENOUS | Status: DC | PRN
  Administered 2012-09-01: 100 mg via INTRAVENOUS

## 2012-09-01 MED ORDER — VANCOMYCIN HCL IN DEXTROSE 1-5 GM/200ML-% IV SOLN
1000.0000 mg | Freq: Once | INTRAVENOUS | Status: DC
  Filled 2012-09-01: qty 200

## 2012-09-01 MED ORDER — SODIUM CHLORIDE 0.9 % IR SOLN
Status: DC | PRN
  Administered 2012-09-01: 1000 mL

## 2012-09-01 MED ORDER — LACTATED RINGERS IV SOLN: 500.0000 mL | Freq: Once | INTRAVENOUS | Status: DC | PRN

## 2012-09-01 MED ORDER — INSULIN REGULAR BOLUS VIA INFUSION
0.0000 [IU] | Freq: Three times a day (TID) | INTRAVENOUS | Status: DC
  Filled 2012-09-01: qty 10

## 2012-09-01 MED ORDER — DEXTROSE 5 % IV SOLN
1.5000 g | Freq: Two times a day (BID) | INTRAVENOUS | Status: AC
  Administered 2012-09-01 – 2012-09-03 (×4): 1.5 g via INTRAVENOUS
  Filled 2012-09-01 (×4): qty 1.5

## 2012-09-01 MED ORDER — SODIUM BICARBONATE 8.4 % IV SOLN
INTRAVENOUS | Status: DC | PRN
  Administered 2012-09-01: 50 meq via INTRAVENOUS

## 2012-09-01 MED ORDER — FENTANYL CITRATE 0.05 MG/ML IJ SOLN
INTRAMUSCULAR | Status: DC | PRN
  Administered 2012-09-01: 150 ug via INTRAVENOUS
  Administered 2012-09-01: 50 ug via INTRAVENOUS
  Administered 2012-09-01: 100 ug via INTRAVENOUS
  Administered 2012-09-01: 150 ug via INTRAVENOUS
  Administered 2012-09-01: 200 ug via INTRAVENOUS
  Administered 2012-09-01 (×2): 250 ug via INTRAVENOUS
  Administered 2012-09-01: 100 ug via INTRAVENOUS
  Administered 2012-09-01: 150 ug via INTRAVENOUS

## 2012-09-01 MED ORDER — SODIUM CHLORIDE 0.9 % IV SOLN: INTRAVENOUS | Status: DC

## 2012-09-01 MED ORDER — POTASSIUM CHLORIDE 10 MEQ/50ML IV SOLN: 10.0000 meq | INTRAVENOUS | Status: AC

## 2012-09-01 MED ORDER — MILRINONE IN DEXTROSE 20 MG/100ML IV SOLN
0.1250 ug/kg/min | Freq: Once | INTRAVENOUS | Status: DC
  Filled 2012-09-01: qty 100

## 2012-09-01 SURGICAL SUPPLY — 136 items
ADAPTER CARDIO PERF ANTE/RETRO (ADAPTER) ×5 IMPLANT
ADPR PRFSN 84XANTGRD RTRGD (ADAPTER) ×2
APL SKNCLS STERI-STRIP NONHPOA (GAUZE/BANDAGES/DRESSINGS) ×2
APPLICATOR COTTON TIP 6IN STRL (MISCELLANEOUS) IMPLANT
ATTRACTOMAT 16X20 MAGNETIC DRP (DRAPES) ×6 IMPLANT
BAG DECANTER FOR FLEXI CONT (MISCELLANEOUS) ×6 IMPLANT
BANDAGE ELASTIC 4 VELCRO ST LF (GAUZE/BANDAGES/DRESSINGS) ×4 IMPLANT
BANDAGE ELASTIC 6 VELCRO ST LF (GAUZE/BANDAGES/DRESSINGS) ×4 IMPLANT
BANDAGE GAUZE ELAST BULKY 4 IN (GAUZE/BANDAGES/DRESSINGS) ×4 IMPLANT
BANDAGE HEMOSTAT MRDH 4X4 STRL (MISCELLANEOUS) ×1 IMPLANT
BENZOIN TINCTURE PRP APPL 2/3 (GAUZE/BANDAGES/DRESSINGS) ×3 IMPLANT
BLADE CORE FAN STRYKER (BLADE) ×6 IMPLANT
BLADE SAW SAG 29X58X.64 (BLADE) ×2 IMPLANT
BLADE STERNUM SYSTEM 6 (BLADE) ×4 IMPLANT
BLADE SURG 11 STRL SS (BLADE) ×2 IMPLANT
BLADE SURG 12 STRL SS (BLADE) ×2 IMPLANT
BLADE SURG 15 STRL LF DISP TIS (BLADE) ×4 IMPLANT
BLADE SURG 15 STRL SS (BLADE) ×8
BNDG HEMOSTAT MRDH 4X4 STRL (MISCELLANEOUS) ×4
BOOT SUTURE AID YELLOW STND (SUTURE) ×8 IMPLANT
CANISTER SUCTION 2500CC (MISCELLANEOUS) ×5 IMPLANT
CANN PRFSN 3/8XCNCT ST RT ANG (MISCELLANEOUS) ×2
CANN PRFSN 3/8XRT ANG TPR 14 (MISCELLANEOUS) ×2
CANNULA AORTIC ROOT 20012 (MISCELLANEOUS) ×2 IMPLANT
CANNULA GUNDRY RCSP 15FR (MISCELLANEOUS) ×2 IMPLANT
CANNULA PRFSN 3/8XCNCT RT ANG (MISCELLANEOUS) ×1 IMPLANT
CANNULA PRFSN 3/8XRT ANG TPR14 (MISCELLANEOUS) IMPLANT
CANNULA VEN MTL TIP RT (MISCELLANEOUS) ×8
CATH FOLEY 2WAY SLVR  5CC 12FR (CATHETERS) ×2
CATH FOLEY 2WAY SLVR 5CC 12FR (CATHETERS) ×2 IMPLANT
CATH RETROPLEGIA CORONARY 14FR (CATHETERS) ×2 IMPLANT
CATH ROBINSON RED A/P 18FR (CATHETERS) ×19 IMPLANT
CATH THORACIC 36FR RT ANG (CATHETERS) ×6 IMPLANT
CATH/SQUID NICHOLS JEHLE COR (CATHETERS) ×2 IMPLANT
CLIP FOGARTY SPRING 6M (CLIP) IMPLANT
CLOSURE WOUND 1/2 X4 (GAUZE/BANDAGES/DRESSINGS) ×1
CLOTH BEACON ORANGE TIMEOUT ST (SAFETY) ×6 IMPLANT
CONN 1/2X1/2X1/2  BEN (MISCELLANEOUS) ×2
CONN 1/2X1/2X1/2 BEN (MISCELLANEOUS) ×4 IMPLANT
CONN 3/8X1/2 ST GISH (MISCELLANEOUS) ×12 IMPLANT
CONT SPEC 4OZ CLIKSEAL STRL BL (MISCELLANEOUS) ×2 IMPLANT
COVER SURGICAL LIGHT HANDLE (MISCELLANEOUS) ×6 IMPLANT
CRADLE DONUT ADULT HEAD (MISCELLANEOUS) ×6 IMPLANT
DRAPE CARDIOVASCULAR INCISE (DRAPES) ×4
DRAPE SLUSH/WARMER DISC (DRAPES) ×2 IMPLANT
DRAPE SRG 135X102X78XABS (DRAPES) ×2 IMPLANT
DRSG COVADERM 4X14 (GAUZE/BANDAGES/DRESSINGS) ×6 IMPLANT
DRSG COVADERM 4X6 (GAUZE/BANDAGES/DRESSINGS) ×2 IMPLANT
ELECT REM PT RETURN 9FT ADLT (ELECTROSURGICAL) ×8
ELECTRODE REM PT RTRN 9FT ADLT (ELECTROSURGICAL) ×4 IMPLANT
GAUZE XEROFORM 5X9 LF (GAUZE/BANDAGES/DRESSINGS) ×4 IMPLANT
GLOVE BIO SURGEON STRL SZ 6 (GLOVE) ×4 IMPLANT
GLOVE BIO SURGEON STRL SZ 6.5 (GLOVE) ×4 IMPLANT
GLOVE BIO SURGEON STRL SZ7 (GLOVE) IMPLANT
GLOVE BIO SURGEONS STRL SZ 6.5 (GLOVE) ×4
GLOVE BIOGEL M STRL SZ7.5 (GLOVE) ×8 IMPLANT
GLOVE BIOGEL PI IND STRL 7.0 (GLOVE) ×6 IMPLANT
GLOVE BIOGEL PI INDICATOR 7.0 (GLOVE) ×6
GOWN STRL NON-REIN LRG LVL3 (GOWN DISPOSABLE) ×32 IMPLANT
HANDLE STAPLE ENDO GIA SHORT (STAPLE) ×2
HEMOSTAT POWDER SURGIFOAM 1G (HEMOSTASIS) ×6 IMPLANT
HEMOSTAT SURGICEL 2X14 (HEMOSTASIS) IMPLANT
INSERT FOGARTY XLG (MISCELLANEOUS) ×2 IMPLANT
KIT BASIN OR (CUSTOM PROCEDURE TRAY) ×5 IMPLANT
KIT PAIN CUSTOM (MISCELLANEOUS) IMPLANT
KIT ROOM TURNOVER OR (KITS) ×6 IMPLANT
KIT SUCTION CATH 14FR (SUCTIONS) ×6 IMPLANT
LOOP VESSEL SUPERMAXI WHITE (MISCELLANEOUS) ×4 IMPLANT
MARKER GRAFT CORONARY BYPASS (MISCELLANEOUS) ×2 IMPLANT
NS IRRIG 1000ML POUR BTL (IV SOLUTION) ×28 IMPLANT
PACK OPEN HEART (CUSTOM PROCEDURE TRAY) ×6 IMPLANT
PAD ARMBOARD 7.5X6 YLW CONV (MISCELLANEOUS) ×10 IMPLANT
PAD DEFIB R2 (MISCELLANEOUS) ×2 IMPLANT
PAD ELECT DEFIB RADIOL ZOLL (MISCELLANEOUS) ×8 IMPLANT
PAD SHARPS MAGNETIC DISPOSAL (MISCELLANEOUS) ×2 IMPLANT
PATCH TACHOSII LRG 9.5X4.8 (VASCULAR PRODUCTS) ×3 IMPLANT
PEDIATRIC SUCKERS (MISCELLANEOUS) ×6 IMPLANT
PENCIL BUTTON HOLSTER BLD 10FT (ELECTRODE) ×4 IMPLANT
RELOAD ENDO GIA 30 3.5 (STAPLE) ×3 IMPLANT
RING TRICUSPID T30 (Prosthesis & Implant Heart) ×2 IMPLANT
SEALANT SURG COSEAL 8ML (VASCULAR PRODUCTS) ×2 IMPLANT
SPONGE GAUZE 4X4 12PLY (GAUZE/BANDAGES/DRESSINGS) ×12 IMPLANT
SPONGE LAP 18X18 X RAY DECT (DISPOSABLE) ×4 IMPLANT
SPONGE LAP 4X18 X RAY DECT (DISPOSABLE) ×2 IMPLANT
STAPLER ENDO GIA 12 SHRT THIN (STAPLE) IMPLANT
STAPLER ENDO GIA 12MM SHORT (STAPLE) ×2 IMPLANT
STAPLER VISISTAT 35W (STAPLE) ×2 IMPLANT
STOPCOCK 4 WAY LG BORE MALE ST (IV SETS) ×2 IMPLANT
STRIP CLOSURE SKIN 1/2X4 (GAUZE/BANDAGES/DRESSINGS) ×1 IMPLANT
SUCKER WEIGHTED FLEX (MISCELLANEOUS) ×2 IMPLANT
SURGIFLO W/THROMBIN 8M KIT (HEMOSTASIS) ×2 IMPLANT
SUT ETHIBON 2 0 V 52N 30 (SUTURE) ×4 IMPLANT
SUT ETHIBON EXCEL 2-0 V-5 (SUTURE) ×8 IMPLANT
SUT ETHIBOND (SUTURE) ×6 IMPLANT
SUT ETHIBOND 2 0 SH (SUTURE) ×2 IMPLANT
SUT ETHIBOND 2 0 V4 (SUTURE) IMPLANT
SUT ETHIBOND 2 0 V5 (SUTURE) ×6 IMPLANT
SUT ETHIBOND 2 0V4 GREEN (SUTURE) ×4 IMPLANT
SUT ETHIBOND 2-0 RB-1 WHT (SUTURE) ×4 IMPLANT
SUT ETHIBOND 4 0 TF (SUTURE) IMPLANT
SUT ETHIBOND 5 0 C 1 30 (SUTURE) ×2 IMPLANT
SUT ETHIBOND V-5 VALVE (SUTURE) ×4 IMPLANT
SUT MNCRL AB 4-0 PS2 18 (SUTURE) ×2 IMPLANT
SUT PROLENE 3 0 SH 1 (SUTURE) ×4 IMPLANT
SUT PROLENE 3 0 SH DA (SUTURE) ×8 IMPLANT
SUT PROLENE 3 0 SH1 36 (SUTURE) ×9 IMPLANT
SUT PROLENE 4 0 RB 1 (SUTURE) ×48
SUT PROLENE 4 0 SH DA (SUTURE) ×8 IMPLANT
SUT PROLENE 4-0 RB1 .5 CRCL 36 (SUTURE) ×8 IMPLANT
SUT PROLENE 5 0 C 1 36 (SUTURE) ×6 IMPLANT
SUT PROLENE 6 0 C 1 30 (SUTURE) ×8 IMPLANT
SUT PROLENE 6 0 CC (SUTURE) ×14 IMPLANT
SUT PROLENE 7.0 RB 3 (SUTURE) ×4 IMPLANT
SUT PROLENE BLUE 7 0 (SUTURE) ×4 IMPLANT
SUT SILK  1 MH (SUTURE) ×2
SUT SILK 1 MH (SUTURE) IMPLANT
SUT SILK 1 TIES 10X30 (SUTURE) ×4 IMPLANT
SUT SILK 2 0 SH CR/8 (SUTURE) ×8 IMPLANT
SUT SILK 2 0SH CR/8 30 (SUTURE) ×3 IMPLANT
SUT STEEL 6MS V (SUTURE) ×4 IMPLANT
SUT VIC AB 1 CTX 18 (SUTURE) ×13 IMPLANT
SUT VIC AB 2-0 CT1 27 (SUTURE) ×8
SUT VIC AB 2-0 CT1 TAPERPNT 27 (SUTURE) IMPLANT
SUT VIC AB 2-0 CTX 27 (SUTURE) IMPLANT
SUT VIC AB 3-0 X1 27 (SUTURE) ×4 IMPLANT
SYSTEM SAHARA CHEST DRAIN ATS (WOUND CARE) ×6 IMPLANT
TAPE CLOTH SURG 4X10 WHT LF (GAUZE/BANDAGES/DRESSINGS) ×2 IMPLANT
TOWEL OR 17X24 6PK STRL BLUE (TOWEL DISPOSABLE) ×10 IMPLANT
TOWEL OR 17X26 10 PK STRL BLUE (TOWEL DISPOSABLE) ×12 IMPLANT
TRAY CATH LUMEN 1 20CM STRL (SET/KITS/TRAYS/PACK) ×2 IMPLANT
TRAY FOLEY IC TEMP SENS 14FR (CATHETERS) ×5 IMPLANT
TUBING ART PRESS 72  MALE/FEM (TUBING) ×4
TUBING ART PRESS 72 MALE/FEM (TUBING) IMPLANT
UNDERPAD 30X30 INCONTINENT (UNDERPADS AND DIAPERS) ×6 IMPLANT
VALVE MAGNA EASE 21MM (Prosthesis & Implant Heart) ×2 IMPLANT
WATER STERILE IRR 1000ML POUR (IV SOLUTION) ×10 IMPLANT

## 2012-09-01 NOTE — OR Nursing (Signed)
Volunteer called @ 1443 to notify patient off pump

## 2012-09-01 NOTE — Anesthesia Postprocedure Evaluation (Signed)
  Anesthesia Post-op Note  Patient: Ethan Lawrence  Procedure(s) Performed: Procedure(s): REDO AORTIC VALVE REPLACEMENT (AVR) (N/A) TRICUSPID VALVE REPAIR (N/A) INTRAOPERATIVE TRANSESOPHAGEAL ECHOCARDIOGRAM (N/A)  Patient Location: SICU  Anesthesia Type:General  Level of Consciousness: sedated and Patient remains intubated per anesthesia plan  Airway and Oxygen Therapy: Patient remains intubated per anesthesia plan and Patient placed on Ventilator (see vital sign flow sheet for setting)  Post-op Pain: none  Post-op Assessment: Post-op Vital signs reviewed, [name] Cardiovascular Status Stable, Respiratory Function Stable, Patent Airway, No signs of Nausea or vomiting and Pain level controlled  Post-op Vital Signs: Reviewed and stable  Complications: No apparent anesthesia complications

## 2012-09-01 NOTE — Progress Notes (Signed)
The patient was examined and preop studies reviewed. There has been no change from the prior exam and the patient is ready for surgery.  Plan redo sternotomy and Aortic valve replacement- possible TV repair today on C Lindy

## 2012-09-01 NOTE — OR Nursing (Signed)
SICU first call @ 1458

## 2012-09-01 NOTE — Anesthesia Preprocedure Evaluation (Addendum)
Anesthesia Evaluation   Patient awake    Reviewed: Allergy & Precautions, H&P , NPO status , [name] Chart, lab work & pertinent test results  History of Anesthesia Complications Negative for: history of anesthetic complications  Airway Mallampati: II TM Distance: >3 FB Neck ROM: Full    Dental  (+) Dental Advisory Given   Pulmonary neg pulmonary ROS, former smoker,          Cardiovascular hypertension, Pt. on medications + CAD, + Past MI and + Peripheral Vascular Disease + dysrhythmias Atrial Fibrillation     Neuro/Psych negative neurological ROS  negative psych ROS   GI/Hepatic negative GI ROS, Neg liver ROS,   Endo/Other  negative endocrine ROS  Renal/GU Renal InsufficiencyRenal disease     Musculoskeletal   Abdominal   Peds  Hematology negative hematology ROS (+)   Anesthesia Other Findings   Reproductive/Obstetrics                          Anesthesia Physical Anesthesia Plan  ASA: IV  Anesthesia Plan: General   Post-op Pain Management:    Induction: Intravenous  Airway Management Planned: Oral ETT  Additional Equipment: Arterial line, PA Cath and 3D TEE  Intra-op Plan:   Post-operative Plan: Post-operative intubation/ventilation  Informed Consent:   Plan Discussed with: CRNA, Anesthesiologist and Surgeon  Anesthesia Plan Comments:         Anesthesia Quick Evaluation

## 2012-09-01 NOTE — Op Note (Signed)
NAME:  Ethan Lawrence, Ethan Lawrence NO.:  [id number]  MEDICAL RECORD NO.:  [id number]  LOCATION:  2312                         FACILITY:  [facility]  PHYSICIAN:  Kerin Perna, M.D.  DATE OF BIRTH:  [birth year]  DATE OF PROCEDURE:  09/01/2012 DATE OF DISCHARGE:                              OPERATIVE REPORT   OPERATIONS: 1. Redo sternotomy. 2. Aortic valve replacement with a 21-mm pericardial tissue valve     (Edwards serial number [id number]). 3. Tricuspid valve repair with a ring annuloplasty Randa Ev[facility] 3     annuloplasty ring serial [id number]). 4. Placement of femoral artery A-line for blood pressure monitoring. 5. Right axillary artery cannulation for cardiopulmonary bypass due to     calcified ascending aorta.  PREOPERATIVE DIAGNOSES:  Moderate-to-severe aortic stenosis and moderate- to-severe tricuspid regurgitation, status post coronary artery bypass graft  x4, 8 years ago with patent bypass grafts.  POSTOPERATIVE DIAGNOSES:  Moderate-to-severe aortic stenosis and moderate-to-severe tricuspid regurgitation, status post coronary artery bypass graft  x4, 8 years ago with patent bypass grafts.  SURGEON:  Kerin Perna, M.D.  ASSISTANT:  Coral Ceo, PA-C.  ANESTHESIA:  General by Dr. Claybon Jabs.  INDICATIONS:  The patient is an 79 year old who has had progressive dyspnea on exertion and decreased exercise tolerance over the past few months.  A 2-D echocardiogram showed increased aortic stenosis and cardiac catheterization demonstrated patent grafts to his LAD, OM, and RCA with fairly well-preserved LV function.  A 2-D echocardiogram showed fairly severe tricuspid regurgitation, and pulmonary artery pressures were moderately elevated at right heart cath 45/25 mmHg.  The patient is felt to be a candidate for redo sternotomy for aortic valve replacement and tricuspid valve repair.  Prior to surgery, I examined the patient in the office and reviewed the results of  the cardiac catheterizations and 2-D echo with the patient and his daughter.  I discussed the indications and expected benefits of aortic valve replacement for aortic stenosis and tricuspid valve repair for tricuspid regurgitation.  I discussed the details of surgery with the patient and daughter including use of general anesthesia, the location of the surgical incisions, use of cardiopulmonary bypass, and expected postoperative recovery.  The patient has a history of chronic anemia and he would be at risk of needing a blood transfusion for redo heart operation.  He also understood the risks of renal failure, stroke, MI, infection, and death.  After reviewing these issues, he demonstrated his understanding and agreed to proceed with surgery under what I felt was an informed consent.  OPERATIVE FINDINGS: 1. 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 2. Tricuspid heavily calcified aortic valve, successfully replaced     with a pericardial tissue valve 3. Severe TR successfully repaired with a 30-mm annuloplasty ring.  OPERATIVE PROCEDURE:  The patient was brought to the operating room, placed supine on the operating table.  General anesthesia was induced under invasive hemodynamic monitoring.  The transesophageal echo probe was placed by the anesthesiologist, which confirmed the preoperative diagnosis of aortic stenosis and tricuspid regurgitation.  The patient was then prepped and draped as a sterile field.  A proper time-out was performed.  A femoral A-line was placed for blood pressure monitoring.  The incision  was made beneath the right clavicle to expose the right axillary artery.  It was dissected and encircled with a vessel loop. Then, heparin 5000 units was administered.  Two vascular clamps were placed proximally and distally and an aortotomy was performed.  A cortex graft-cannula was then sewn end-to-side using running 5-0 Prolene and the anastomosis was hemostatic.   The cannula was left clamped.  The re-sternal incision was then made with care being taken to avoid injury to the underlying vascular structures.  The sternal retractor was placed and carefully and slowly the dense mediastinal adhesions were dissected to expose the right ventricle, right atrium, ascending aorta, the patent vein bypass grafts, and then finally the patent left IMA, which was encircled with a vessel loop.  The severe superior and inferior vena cava were dissected for bicaval drainage and pursestrings were placed in the SVC and IVC, and heparin was administered.  When the ACT was documented as being therapeutic, the patient was cannulated and vessel loops were placed and the patient was placed on cardiopulmonary bypass.  The remainder of the dissection around the aortopulmonary artery and right ventricle at the outflow tract was then completed in preparation for the surgery.  Cardioplegia cannulas were placed for both antegrade and retrograde cold blood cardioplegia and the patient was cooled to 32 degrees.  A long aortic crossclamp was placed and 1.5 L of cold blood cardioplegia was delivered in split doses between the antegrade aortic and retrograde coronary sinus catheters.  There was good cardioplegic arrest and septal temperature dropped less than 11 degrees.  Cardioplegia was delivered every 20 minutes or less while crossclamp was in place.  The RV outflow tract fat was retracted with retraction sutures and the proximal aorta was mobilized.  The saphenous vein graft to the right coronary was mobilized and retracted laterally to make room for the aortotomy.  A transverse aortotomy was made and the aorta was somewhat calcified.  The aortic valve was inspected and was heavily calcified and stenotic with thickened leaflets.  The leaflets were excised and the anulus was debrided of calcium.  The outflow tract was irrigated with copious amounts of cold saline.  The anulus  was sized to a 21 mm valve. Subannular 2-0 Ethibond pledgeted sutures were placed around the anulus numbering 15 total.  The valve was prepared according to protocol and then, the sutures were placed through the sewing ring and the valve was seated and sutures were tied.  The valve conformed to the anulus well and there was no space for perivalvular leaks.  The coronary ostia were widely patent.  The aortotomy was closed with a running 4-0 Prolene in 2 layers.  Cardioplegia was redosed.  Attention was then directed to the tricuspid valve.  A vertical atriotomy was made as the cable tapes were tightened around the SVC and IVC cannulas.  The retrograde cardioplegic catheter was removed from the coronary sinus and retracted laterally.  The Swan-Ganz catheter was removed from the tricuspid valve and retracted laterally.  The valve was inspected and there was no evidence of structural abnormalities of the tricuspid valve.  The anterior leaflet was sized to a 30-mm sizer.  A 2- 0 Ethibond sutures were then placed around the anulus with care being taken to avoid the region of the conduction system.  The sutures along the septal leaflet were placed in the leaflet to avoid injury to the conduction system.  The MC 3 ring was then brought to the field and sutures were  placed in the ring and the ring was seated and the sutures were tied.  The valve was tested with a cold saline injection and there was good coaptation of the leaflets with trivial tricuspid regurgitation.  The right atriotomy was then closed with double layer of running 4-0 Prolene.  The patient was rewarmed and then a dose of retrograde warm cardioplegia was delivered in the coronary sinus to remove any residual air.  The cardioplegia cannula had been replaced in the coronary sinus prior to closing the right atrium.  The crossclamp was then removed.  The heart resumed a slow rhythm without cardioversion.  The aortotomy and the  atriotomy were checked and found hemostatic.  Temporary pacing wires were applied.  The cardioplegia line and vent was remained in the ascending aorta to remove__ any residual air.  It should be noted that the surgical field was insufflated with carbon dioxide during the procedure of aortic valve replacement and tricuspid valve repair.  Low-dose dopamine and milrinone were started.  The lungs re-expanded and the pleural spaces were evacuated of any fluid.  Nitric oxide was started to help RV function at 20 parts per million through the ventilator circuit.  The patient was then weaned from cardiopulmonary bypass without difficulty.  AV sequential pacing was required at 90 beats per minute.  Hemodynamics were stable and the 2-D echo showed normal functioning aortic valve, adequate biventricular function and trivial tricuspid regurgitation after successful repair.  Protamine was administered.  There was still diffuse coagulopathy.  The platelet returned low at 70,000.  The patient was transfused 2 units of platelets in 1 FFP.  This improved coagulation function.  There is diffuse bruising from most surfaces.  Considerable amount of time was taken to obtain adequate hemostasis, but the suture lines and cannulation sites were not bleeding.  The anterior mediastinal chest tubes were placed and brought through separate incisions.  The right and left pleural chest tubes were placed and brought out through separate incisions.  The arterial cannula in the right axillary artery was stapled with a Endo-GIA stapling device, and was hemostatic.  This incision was closed in layers using Vicryl and then staples for the skin since the patient had very thin skin from chronic steroid use-prednisone 5 mg daily.  The mediastinum was irrigated with warm saline.  The superior pericardial fat was closed over the aorta.  The sternal wires were placed.  The sternum was closed.  The patient remained  hemodynamically stable.  The pectoralis fascia and subcutaneous layers were closed in running Vicryl.  The sternal incision was closed with skin staplers due to a very thin fragile skin.  Sterile dressings were applied.  The patient returned to the ICU in critical, but stable condition. Total cardiopulmonary bypass time was 210 minutes.     Kerin Perna, M.D.     PV/MEDQ  D:  09/01/2012  T:  09/01/2012  Job:  [id number]  cc:   Lyn Records, M.D.

## 2012-09-01 NOTE — Preoperative (Signed)
Beta Blockers   Reason not to administer Beta Blockers:Not Applicable 

## 2012-09-01 NOTE — Transfer of Care (Signed)
Immediate Anesthesia Transfer of Care Note  Patient: Ethan Lawrence  Procedure(s) Performed: Procedure(s): REDO AORTIC VALVE REPLACEMENT (AVR) (N/A) TRICUSPID VALVE REPAIR (N/A) INTRAOPERATIVE TRANSESOPHAGEAL ECHOCARDIOGRAM (N/A)  Patient Location: PACU  Anesthesia Type:General  Level of Consciousness: sedated and Patient remains intubated per anesthesia plan  Airway & Oxygen Therapy: Patient remains intubated per anesthesia plan and Patient placed on Ventilator (see vital sign flow sheet for setting)  Post-op Assessment: Report given to PACU RN and Post -op Vital signs reviewed and stable  Post vital signs: Reviewed and stable  Complications: No apparent anesthesia complications

## 2012-09-01 NOTE — Progress Notes (Signed)
Patient ID: Ethan Lawrence, male   DOB: 04-Jun-1922, 79 y.o.   MRN: [id number]   SICU Evening Rounds:  Hemodynamically stable on NO 20 ppm, Milrinone 0.3, dop 3, with CI 1.86  AV paced at 90  On vent  Urine output ok  CT output low.  Nurses have instructions for weaning NO overnight per PVT.

## 2012-09-01 NOTE — Brief Op Note (Signed)
09/01/2012  1:44 PM  PATIENT:  Ethan Lawrence  79 y.o. male  PRE-OPERATIVE DIAGNOSIS:  Aortic Stenosis, Tricuspid Regurgitation  POST-OPERATIVE DIAGNOSIS:  Aortic Stenosis, Tricuspid Regurgitation  PROCEDURE:   REDO MEDIAN STERNOTOMY  AORTIC VALVE REPLACEMENT (21 mm Magna Ease pericardial tissue valve)  TRICUSPID VALVE REPAIR (30 mm MC3 annuloplasty ring)  ENDOSCOPIC VEIN HARVEST LEFT THIGH   SURGEON: Kerin Perna, MD  ASSISTANT: Coral Ceo, PA-C  ANESTHESIA:   general  PATIENT CONDITION:  ICU - intubated and hemodynamically stable.  PRE-OPERATIVE WEIGHT: 76 kg

## 2012-09-02 ENCOUNTER — Encounter (HOSPITAL_COMMUNITY): Payer: Self-pay | Admitting: Cardiothoracic Surgery

## 2012-09-02 ENCOUNTER — Inpatient Hospital Stay (HOSPITAL_COMMUNITY): Payer: Medicare PPO

## 2012-09-02 LAB — COMPREHENSIVE METABOLIC PANEL
ALT: 15 U/L (ref 0–53)
AST: 40 U/L — ABNORMAL HIGH (ref 0–37)
Albumin: 3.2 g/dL — ABNORMAL LOW (ref 3.5–5.2)
Alkaline Phosphatase: 73 U/L (ref 39–117)
BUN: 24 mg/dL — ABNORMAL HIGH (ref 6–23)
CO2: 19 mEq/L (ref 19–32)
Calcium: 8.9 mg/dL (ref 8.4–10.5)
Chloride: 104 mEq/L (ref 96–112)
Creatinine, Ser: 1.81 mg/dL — ABNORMAL HIGH (ref 0.50–1.35)
GFR calc Af Amer: 37 mL/min — ABNORMAL LOW (ref >90)
GFR calc non Af Amer: 32 mL/min — ABNORMAL LOW (ref >90)
Glucose, Bld: 158 mg/dL — ABNORMAL HIGH (ref 70–99)
Potassium: 4.2 mEq/L (ref 3.5–5.1)
Sodium: 139 mEq/L (ref 135–145)
Total Bilirubin: 0.5 mg/dL (ref 0.3–1.2)
Total Protein: 5 g/dL — ABNORMAL LOW (ref 6.0–8.3)

## 2012-09-02 LAB — POCT I-STAT 3, ART BLOOD GAS (G3+)
Acid-base deficit: 4 mmol/L — ABNORMAL HIGH (ref 0.0–2.0)
Acid-base deficit: 5 mmol/L — ABNORMAL HIGH (ref 0.0–2.0)
Acid-base deficit: 2 mmol/L (ref 0.0–2.0)
Acid-base deficit: 3 mmol/L — ABNORMAL HIGH (ref 0.0–2.0)
Bicarbonate: 20.9 mEq/L (ref 20.0–24.0)
Bicarbonate: 19.8 mEq/L — ABNORMAL LOW (ref 20.0–24.0)
Bicarbonate: 22.6 mEq/L (ref 20.0–24.0)
Bicarbonate: 20.8 mEq/L (ref 20.0–24.0)
O2 Saturation: 99 %
O2 Saturation: 93 %
O2 Saturation: 94 %
O2 Saturation: 93 %
Patient temperature: 35.9
Patient temperature: 37.8
Patient temperature: 97.6
TCO2: 22 mmol/L (ref 0–100)
TCO2: 21 mmol/L (ref 0–100)
TCO2: 24 mmol/L (ref 0–100)
TCO2: 22 mmol/L (ref 0–100)
pCO2 arterial: 36.2 mmHg (ref 35.0–45.0)
pCO2 arterial: 34.5 mmHg — ABNORMAL LOW (ref 35.0–45.0)
pCO2 arterial: 36.4 mmHg (ref 35.0–45.0)
pCO2 arterial: 31.9 mmHg — ABNORMAL LOW (ref 35.0–45.0)
pH, Arterial: 7.364 (ref 7.350–7.450)
pH, Arterial: 7.366 (ref 7.350–7.450)
pH, Arterial: 7.405 (ref 7.350–7.450)
pH, Arterial: 7.42 (ref 7.350–7.450)
pO2, Arterial: 121 mmHg — ABNORMAL HIGH (ref 80.0–100.0)
pO2, Arterial: 67 mmHg — ABNORMAL LOW (ref 80.0–100.0)
pO2, Arterial: 75 mmHg — ABNORMAL LOW (ref 80.0–100.0)
pO2, Arterial: 61 mmHg — ABNORMAL LOW (ref 80.0–100.0)

## 2012-09-02 LAB — MAGNESIUM
Magnesium: 2.1 mg/dL (ref 1.5–2.5)
Magnesium: 1.9 mg/dL (ref 1.5–2.5)

## 2012-09-02 LAB — PREPARE FRESH FROZEN PLASMA

## 2012-09-02 LAB — PREPARE PLATELET PHERESIS
Unit division: 0
Unit division: 0

## 2012-09-02 LAB — GLUCOSE, CAPILLARY
Glucose-Capillary: 105 mg/dL — ABNORMAL HIGH (ref 70–99)
Glucose-Capillary: 112 mg/dL — ABNORMAL HIGH (ref 70–99)
Glucose-Capillary: 116 mg/dL — ABNORMAL HIGH (ref 70–99)
Glucose-Capillary: 116 mg/dL — ABNORMAL HIGH (ref 70–99)
Glucose-Capillary: 118 mg/dL — ABNORMAL HIGH (ref 70–99)
Glucose-Capillary: 119 mg/dL — ABNORMAL HIGH (ref 70–99)
Glucose-Capillary: 147 mg/dL — ABNORMAL HIGH (ref 70–99)
Glucose-Capillary: 159 mg/dL — ABNORMAL HIGH (ref 70–99)
Glucose-Capillary: 162 mg/dL — ABNORMAL HIGH (ref 70–99)
Glucose-Capillary: 170 mg/dL — ABNORMAL HIGH (ref 70–99)
Glucose-Capillary: 174 mg/dL — ABNORMAL HIGH (ref 70–99)
Glucose-Capillary: 195 mg/dL — ABNORMAL HIGH (ref 70–99)
Glucose-Capillary: 91 mg/dL (ref 70–99)
Glucose-Capillary: 92 mg/dL (ref 70–99)

## 2012-09-02 LAB — CBC
HCT: 26.5 % — ABNORMAL LOW (ref 39.0–52.0)
Hemoglobin: 7.6 g/dL — ABNORMAL LOW (ref 13.0–17.0)
MCHC: 32.5 g/dL (ref 30.0–36.0)
Platelets: 86 10*3/uL — ABNORMAL LOW (ref 150–400)
RBC: 2.92 MIL/uL — ABNORMAL LOW (ref 4.22–5.81)
RDW: 18.4 % — ABNORMAL HIGH (ref 11.5–15.5)
WBC: 11.3 10*3/uL — ABNORMAL HIGH (ref 4.0–10.5)

## 2012-09-02 LAB — BASIC METABOLIC PANEL
BUN: 23 mg/dL (ref 6–23)
GFR calc Af Amer: 43 mL/min — ABNORMAL LOW (ref >90)
GFR calc non Af Amer: 37 mL/min — ABNORMAL LOW (ref >90)
Potassium: 4.7 mEq/L (ref 3.5–5.1)
Sodium: 137 mEq/L (ref 135–145)

## 2012-09-02 MED ORDER — FUROSEMIDE 10 MG/ML IJ SOLN
40.0000 mg | Freq: Every day | INTRAMUSCULAR | Status: DC
  Administered 2012-09-02 – 2012-09-06 (×5): 40 mg via INTRAVENOUS
  Filled 2012-09-02 (×5): qty 4

## 2012-09-02 MED ORDER — MILRINONE IN DEXTROSE 20 MG/100ML IV SOLN
0.2000 ug/kg/min | INTRAVENOUS | Status: AC
  Administered 2012-09-03: 0.2 ug/kg/min via INTRAVENOUS
  Administered 2012-09-03: 0.3 ug/kg/min via INTRAVENOUS
  Administered 2012-09-04: 0.2 ug/kg/min via INTRAVENOUS
  Filled 2012-09-02 (×3): qty 100

## 2012-09-02 MED ORDER — CALCIUM CHLORIDE 10 % IV SOLN
1.0000 g | Freq: Once | INTRAVENOUS | Status: AC
  Administered 2012-09-02: 1 g via INTRAVENOUS

## 2012-09-02 MED ORDER — NITROGLYCERIN IN D5W 200-5 MCG/ML-% IV SOLN
2.0000 ug/min | INTRAVENOUS | Status: DC
  Administered 2012-09-02: 10 ug/min via INTRAVENOUS
  Administered 2012-09-03: 100 ug/min via INTRAVENOUS

## 2012-09-02 MED ORDER — HALOPERIDOL LACTATE 5 MG/ML IJ SOLN
2.0000 mg | Freq: Four times a day (QID) | INTRAMUSCULAR | Status: DC | PRN
  Administered 2012-09-02: 2 mg via INTRAVENOUS
  Filled 2012-09-02: qty 1

## 2012-09-02 MED ORDER — SODIUM CHLORIDE 0.9 % IV SOLN
1.0000 g | Freq: Once | INTRAVENOUS | Status: DC
  Filled 2012-09-02: qty 10

## 2012-09-02 MED ORDER — THIAMINE HCL 100 MG/ML IJ SOLN
100.0000 mg | Freq: Every day | INTRAMUSCULAR | Status: DC
  Administered 2012-09-02: 100 mg via INTRAVENOUS
  Administered 2012-09-03: 50 mg via INTRAVENOUS
  Administered 2012-09-04 – 2012-09-06 (×3): 100 mg via INTRAVENOUS
  Filled 2012-09-02 (×5): qty 1

## 2012-09-02 MED ORDER — FENTANYL CITRATE 0.05 MG/ML IJ SOLN
50.0000 ug | INTRAMUSCULAR | Status: DC | PRN
  Administered 2012-09-02 – 2012-09-07 (×26): 50 ug via INTRAVENOUS
  Administered 2012-09-11: 12.5 ug via INTRAVENOUS
  Administered 2012-09-11: 25 ug via INTRAVENOUS
  Administered 2012-09-11 (×2): 12.5 ug via INTRAVENOUS
  Filled 2012-09-02 (×22): qty 2

## 2012-09-02 MED ORDER — MIDAZOLAM HCL 2 MG/2ML IJ SOLN
2.0000 mg | INTRAMUSCULAR | Status: DC | PRN
  Administered 2012-09-02: 4 mg via INTRAVENOUS

## 2012-09-02 MED ORDER — FENTANYL CITRATE 0.05 MG/ML IJ SOLN
INTRAMUSCULAR | Status: AC
  Filled 2012-09-02: qty 2

## 2012-09-02 MED ORDER — DOPAMINE-DEXTROSE 3.2-5 MG/ML-% IV SOLN: 3.0000 ug/kg/min | INTRAVENOUS | Status: DC

## 2012-09-02 MED ORDER — TRAMADOL HCL 50 MG PO TABS
50.0000 mg | ORAL_TABLET | Freq: Four times a day (QID) | ORAL | Status: DC | PRN
  Administered 2012-09-03 – 2012-09-12 (×20): 50 mg via ORAL
  Filled 2012-09-02 (×22): qty 1

## 2012-09-02 MED ORDER — FUROSEMIDE 10 MG/ML IJ SOLN
40.0000 mg | Freq: Once | INTRAMUSCULAR | Status: DC
  Filled 2012-09-02: qty 4

## 2012-09-02 MED ORDER — FUROSEMIDE 10 MG/ML IJ SOLN: 40.0000 mg | Freq: Once | INTRAMUSCULAR | Status: DC

## 2012-09-02 MED ORDER — SODIUM BICARBONATE 8.4 % IV SOLN
50.0000 meq | Freq: Once | INTRAVENOUS | Status: AC
  Administered 2012-09-02: 50 meq via INTRAVENOUS
  Filled 2012-09-02: qty 50

## 2012-09-02 MED ORDER — INSULIN ASPART 100 UNIT/ML ~~LOC~~ SOLN
0.0000 [IU] | SUBCUTANEOUS | Status: DC
  Administered 2012-09-02: 4 [IU] via SUBCUTANEOUS
  Administered 2012-09-03 – 2012-09-05 (×8): 2 [IU] via SUBCUTANEOUS
  Administered 2012-09-06: 4 [IU] via SUBCUTANEOUS
  Administered 2012-09-07: 8 [IU] via SUBCUTANEOUS
  Administered 2012-09-08: 2 [IU] via SUBCUTANEOUS
  Administered 2012-09-08: 4 [IU] via SUBCUTANEOUS
  Administered 2012-09-09: 2 [IU] via SUBCUTANEOUS

## 2012-09-02 MED FILL — Magnesium Sulfate Inj 50%: INTRAMUSCULAR | Qty: 10 | Status: AC

## 2012-09-02 MED FILL — Potassium Chloride Inj 2 mEq/ML: INTRAVENOUS | Qty: 40 | Status: AC

## 2012-09-02 MED FILL — Sodium Chloride IV Soln 0.9%: INTRAVENOUS | Qty: 1000 | Status: AC

## 2012-09-02 MED FILL — Heparin Sodium (Porcine) Inj 1000 Unit/ML: INTRAMUSCULAR | Qty: 30 | Status: AC

## 2012-09-02 NOTE — Progress Notes (Signed)
UR Completed.  Ethan Lawrence [phone] 09/02/2012

## 2012-09-02 NOTE — Progress Notes (Signed)
Orthopedic Tech Progress Note Patient Details:  Ethan Lawrence 1923-02-12 [id number]  Ortho Devices Type of Ortho Device: Knee Immobilizer Ortho Device/Splint Interventions: Application   Cammer, Mickie Bail 09/02/2012, 2:52 PM

## 2012-09-02 NOTE — Progress Notes (Signed)
T. CTS p.m. Rounds  Patient extubated this morning after nitric oxide weaned off Remains in heart block at AV sequentially paced Mental status consistent with acute delirium moves all extremities and answers questions appropriately at times  Will cut back on narcotics and support pulmonary status May need permanent pacemaker if heart block persists

## 2012-09-02 NOTE — Progress Notes (Signed)
1 Day Post-Op Procedure(s) (LRB): REDO AORTIC VALVE REPLACEMENT (AVR) (N/A) TRICUSPID VALVE REPAIR (N/A) INTRAOPERATIVE TRANSESOPHAGEAL ECHOCARDIOGRAM (N/A) Subjective: Postop day 1 redo for aVR and tricuspid repair Pacemaker dependent-underlying junctional rhythm 40 beats per minute Sedated on ventilator, weaning nitric oxide Chest x-ray clear hemoglobin 8.8 Adequate urine output  Objective: Vital signs in last 24 hours: Temp:  [96.3 F (35.7 C)-98.8 F (37.1 C)] 97.7 F (36.5 C) (06/25 1100) Pulse Rate:  [89-91] 90 (06/25 1140) Cardiac Rhythm:  [-] A-V Sequential paced (06/25 0800) Resp:  [12-30] 22 (06/25 1140) BP: (75-156)/(46-68) 156/63 mmHg (06/25 1140) SpO2:  [89 %-100 %] 99 % (06/25 1140) Arterial Line BP: (88-150)/(45-65) 150/64 mmHg (06/25 1100) FiO2 (%):  [40 %-100 %] 40 % (06/25 1140) Weight:  [182 lb 12.2 oz (82.9 kg)] 182 lb 12.2 oz (82.9 kg) (06/25 0600)  Hemodynamic parameters for last 24 hours: PAP: (25-51)/(15-23) 51/22 mmHg CVP:  [8 mmHg-14 mmHg] 14 mmHg CO:  [2.8 L/min-7 L/min] 7 L/min CI:  [1.5 L/min/m2-3.6 L/min/m2] 3.6 L/min/m2  Intake/Output from previous day: 06/24 0701 - 06/25 0700 In: 7580.7 [I.V.:4684.7; Blood:1396; IV Piggyback:1500] Out: 5365 [Urine:2235; Emesis/NG output:400; Blood:2200; Chest Tube:530] Intake/Output this shift: Total I/O In: 340 [I.V.:140; IV Piggyback:200] Out: 660 [Urine:240; Emesis/NG output:200; Chest Tube:220]  Remains on ventilator Breath sounds slightly coarse Extremities warm well perfused Moves all extremities  Lab Results:  Recent Labs  09/01/12 2230 09/02/12 0450  WBC 14.5* 11.3*  HGB 10.3* 8.6*  HCT 32.4* 26.5*  PLT 102* 86*   BMET:  Recent Labs  09/01/12 2224 09/01/12 2230 09/02/12 0450  NA 140  --  137  K 4.4  --  4.7  CL 109  --  106  CO2  --   --  21  GLUCOSE 109*  --  211*  BUN 22  --  23  CREATININE 1.60* 1.46* 1.59*  CALCIUM  --   --  8.1*    PT/INR:  Recent Labs   09/01/12 1700  LABPROT 18.3*  INR 1.57*   ABG    Component Value Date/Time   PHART 7.366 09/02/2012 1103   HCO3 19.8* 09/02/2012 1103   TCO2 21 09/02/2012 1103   ACIDBASEDEF 5.0* 09/02/2012 1103   O2SAT 93.0 09/02/2012 1103   CBG (last 3)   Recent Labs  09/02/12 0430 09/02/12 0551 09/02/12 0657  GLUCAP 195* 170* 147*    Assessment/Plan: S/P Procedure(s) (LRB): REDO AORTIC VALVE REPLACEMENT (AVR) (N/A) TRICUSPID VALVE REPAIR (N/A) INTRAOPERATIVE TRANSESOPHAGEAL ECHOCARDIOGRAM (N/A) See progression orders Patient may need permanent pacemaker will follow rhythm Goal today is to extubate then hopefully remove femoral A-line Swan and get out of bed to chair Ask 4 physical therapy consult  LOS: 1 day    VAN TRIGT III,Ethan Lawrence 09/02/2012

## 2012-09-02 NOTE — Progress Notes (Signed)
After extubation, pt is confused and restless. A and O to self only. Pt is trying to get OOB and is unable to be redirected with orientation attempts. MD PVT made aware- new orders received. Will hold PO meds until orientation status has improved r/t unknown risk for aspiration.   Felipa Emory

## 2012-09-02 NOTE — Procedures (Signed)
Extubation Procedure Note  Patient Details:   Name: Ethan Lawrence DOB: [birth year] MRN: [id number]   Pt extubated to 4L [place] per protocol, VS WNL, pt able to vocalize, no stridor noted. Pt tolerating well, RT will continue to monitor.     Evaluation  O2 sats: stable throughout Complications: No apparent complications Patient did tolerate procedure well. Bilateral Breath Sounds: Clear;Diminished   Yes  Harley Hallmark 09/02/2012, 11:44 AM

## 2012-09-02 NOTE — Progress Notes (Signed)
PVT aware of unchanged CPOT status in spite of medication. No new orders

## 2012-09-02 NOTE — Clinical Documentation Improvement (Signed)
THIS DOCUMENT IS NOT A PERMANENT PART OF THE MEDICAL RECORD  Please update your documentation with the medical record to reflect your response to this query. If you need help knowing how to do this please call [phone].  09/02/12  Dear Dr.Van Maudie Flakes Marton Redwood  In an effort to better capture your patient's severity of illness, reflect appropriate length of stay and utilization of resources, a review of the patient medical record has revealed the following indicators.    Based on your clinical judgment, please clarify and document in a progress note and/or discharge summary the clinical condition associated with the following supporting information:  In responding to this query please exercise your independent judgment.  The fact that a query is asked, does not imply that any particular answer is desired or expected.    Possible Clinical Conditions?   " Expected Acute Blood Loss Anemia  " Acute Blood Loss Anemia  " Acute on chronic blood loss anemia  " Precipitous drop in Hematocrit  " Other Condition  " Cannot Clinically Determine     Risk Factors:  EBL: 2200 ml per 6/24 Anesthesia record.   Diagnostics: H&H on 6/24:  11.2/33.0 H&H on 6/24:   7.6/24.2  Transfusion: IV fluids / plasma expanders: Per 6/24 Anesthesia record: Cell saver: 1000 ml; Plts:  396 ml; LR:  3050 ml.  Albumin 5% per doc flow sheets   Reviewed:   Additional documentation provided per 7/01 progress notes.                 Thank You,  Marciano Sequin,  Clinical Documentation Specialist: [phone] Health Information Management [place]

## 2012-09-02 NOTE — Clinical Documentation Improvement (Signed)
THIS DOCUMENT IS NOT A PERMANENT PART OF THE MEDICAL RECORD  Please update your documentation with the medical record to reflect your response to this query. If you need help knowing how to do this please call [phone].  09/02/12   Dear Dr. Donata [facility] / Associates,  In a better effort to capture your patient's severity of illness, reflect appropriate length of stay and utilization of resources, a review of the patient medical record has revealed the following indicators.    Based on your clinical judgment, please clarify and document in a progress note and/or discharge summary the clinical condition associated with the following supporting information:  In responding to this query please exercise your independent judgment.  The fact that a query is asked, does not imply that any particular answer is desired or expected.    Possible Clinical Conditions?  _____Atelectasis   _____Pleural Effusion  _____Other Condition  _____Cannot Clinically Determine       Diagnostics: 6/24 CXR results: Mild bibasilar atelectasis and small left pleural effusion.    You may use possible, probable, or suspect with inpatient documentation. possible, probable, suspected diagnoses MUST be documented at the time of discharge  Reviewed:   Additional documentation provided per 6/30 progress notes.    Thank You,  Marciano Sequin,  Clinical Documentation Specialist: [phone] Health Information Management [place]

## 2012-09-02 NOTE — Progress Notes (Signed)
Weaned of nitric per PVT instructions- and ABG obtained. PO2 was low at 67- TVO received to extubate and get ABG 1 hour after. May dangle with femoral in place and give 1 amp bicarb orders received.   Ethan Lawrence

## 2012-09-02 NOTE — Progress Notes (Signed)
Femoral line d/c with pressure held for 15 minutes. Knee imobilizer placed bilaterally to keep pt from bending knee post line removal. Pt placed on bedrest for 4 hours. Pt is still unable to be reoriented- PVT aware and new orders received.   Felipa Emory

## 2012-09-03 ENCOUNTER — Inpatient Hospital Stay (HOSPITAL_COMMUNITY): Payer: Medicare PPO

## 2012-09-03 LAB — CBC
HCT: 24.9 % — ABNORMAL LOW (ref 39.0–52.0)
Hemoglobin: 8.2 g/dL — ABNORMAL LOW (ref 13.0–17.0)
MCH: 26.7 pg (ref 26.0–34.0)
MCHC: 32.9 g/dL (ref 30.0–36.0)
MCV: 81.1 fL (ref 78.0–100.0)
Platelets: 63 10*3/uL — ABNORMAL LOW (ref 150–400)
RBC: 3.07 MIL/uL — ABNORMAL LOW (ref 4.22–5.81)
RDW: 18.9 % — ABNORMAL HIGH (ref 11.5–15.5)
WBC: 15.3 10*3/uL — ABNORMAL HIGH (ref 4.0–10.5)

## 2012-09-03 LAB — GLUCOSE, CAPILLARY
Glucose-Capillary: 129 mg/dL — ABNORMAL HIGH (ref 70–99)
Glucose-Capillary: 110 mg/dL — ABNORMAL HIGH (ref 70–99)
Glucose-Capillary: 110 mg/dL — ABNORMAL HIGH (ref 70–99)
Glucose-Capillary: 124 mg/dL — ABNORMAL HIGH (ref 70–99)
Glucose-Capillary: 105 mg/dL — ABNORMAL HIGH (ref 70–99)
Glucose-Capillary: 108 mg/dL — ABNORMAL HIGH (ref 70–99)
Glucose-Capillary: 96 mg/dL (ref 70–99)
Glucose-Capillary: 107 mg/dL — ABNORMAL HIGH (ref 70–99)
Glucose-Capillary: 135 mg/dL — ABNORMAL HIGH (ref 70–99)
Glucose-Capillary: 147 mg/dL — ABNORMAL HIGH (ref 70–99)
Glucose-Capillary: 148 mg/dL — ABNORMAL HIGH (ref 70–99)
Glucose-Capillary: 124 mg/dL — ABNORMAL HIGH (ref 70–99)
Glucose-Capillary: 124 mg/dL — ABNORMAL HIGH (ref 70–99)

## 2012-09-03 LAB — COMPREHENSIVE METABOLIC PANEL
ALT: 15 U/L (ref 0–53)
AST: 38 U/L — ABNORMAL HIGH (ref 0–37)
Albumin: 3 g/dL — ABNORMAL LOW (ref 3.5–5.2)
Alkaline Phosphatase: 77 U/L (ref 39–117)
BUN: 27 mg/dL — ABNORMAL HIGH (ref 6–23)
CO2: 22 mEq/L (ref 19–32)
Calcium: 8.5 mg/dL (ref 8.4–10.5)
Chloride: 105 mEq/L (ref 96–112)
Creatinine, Ser: 2.01 mg/dL — ABNORMAL HIGH (ref 0.50–1.35)
GFR calc Af Amer: 32 mL/min — ABNORMAL LOW (ref >90)
GFR calc non Af Amer: 28 mL/min — ABNORMAL LOW (ref >90)
Glucose, Bld: 143 mg/dL — ABNORMAL HIGH (ref 70–99)
Potassium: 4.2 mEq/L (ref 3.5–5.1)
Sodium: 138 mEq/L (ref 135–145)
Total Bilirubin: 0.5 mg/dL (ref 0.3–1.2)
Total Protein: 5.1 g/dL — ABNORMAL LOW (ref 6.0–8.3)

## 2012-09-03 LAB — POCT I-STAT 3, ART BLOOD GAS (G3+)
Acid-base deficit: 2 mmol/L (ref 0.0–2.0)
Bicarbonate: 21.6 mEq/L (ref 20.0–24.0)
O2 Saturation: 92 %
Patient temperature: 98.7
TCO2: 23 mmol/L (ref 0–100)
pCO2 arterial: 33 mmHg — ABNORMAL LOW (ref 35.0–45.0)
pH, Arterial: 7.424 (ref 7.350–7.450)
pO2, Arterial: 62 mmHg — ABNORMAL LOW (ref 80.0–100.0)

## 2012-09-03 LAB — PROTIME-INR
INR: 1.26 (ref 0.00–1.49)
Prothrombin Time: 15.5 seconds — ABNORMAL HIGH (ref 11.6–15.2)

## 2012-09-03 LAB — POCT I-STAT 4, (NA,K, GLUC, HGB,HCT)
Glucose, Bld: 159 mg/dL — ABNORMAL HIGH (ref 70–99)
HCT: 24 % — ABNORMAL LOW (ref 39.0–52.0)
Hemoglobin: 8.2 g/dL — ABNORMAL LOW (ref 13.0–17.0)
Potassium: 3.9 mEq/L (ref 3.5–5.1)
Sodium: 140 mEq/L (ref 135–145)

## 2012-09-03 LAB — APTT: aPTT: 38 seconds — ABNORMAL HIGH (ref 24–37)

## 2012-09-03 MED ORDER — MUPIROCIN 2 % EX OINT
1.0000 "application " | TOPICAL_OINTMENT | Freq: Two times a day (BID) | CUTANEOUS | Status: AC
  Administered 2012-09-03 – 2012-09-07 (×10): 1 via NASAL
  Filled 2012-09-03: qty 22

## 2012-09-03 MED ORDER — CHLORHEXIDINE GLUCONATE CLOTH 2 % EX PADS
6.0000 | MEDICATED_PAD | Freq: Every day | CUTANEOUS | Status: AC
  Administered 2012-09-03 – 2012-09-07 (×5): 6 via TOPICAL

## 2012-09-03 MED FILL — Sodium Bicarbonate IV Soln 8.4%: INTRAVENOUS | Qty: 50 | Status: AC

## 2012-09-03 MED FILL — Mannitol IV Soln 20%: INTRAVENOUS | Qty: 500 | Status: AC

## 2012-09-03 MED FILL — Electrolyte-R (PH 7.4) Solution: INTRAVENOUS | Qty: 4000 | Status: AC

## 2012-09-03 MED FILL — Heparin Sodium (Porcine) Inj 1000 Unit/ML: INTRAMUSCULAR | Qty: 20 | Status: AC

## 2012-09-03 MED FILL — Lidocaine HCl IV Inj 20 MG/ML: INTRAVENOUS | Qty: 5 | Status: AC

## 2012-09-03 MED FILL — Calcium Chloride Inj 10%: INTRAVENOUS | Qty: 10 | Status: AC

## 2012-09-03 MED FILL — Sodium Chloride Irrigation Soln 0.9%: Qty: 1000 | Status: AC

## 2012-09-03 NOTE — Progress Notes (Signed)
Confused and speaking in short one word answers. Inappropriate. Has chronic AFwith RBBB. Rate usually 60's. Auscultation reveals no rub or aortic regurg. Expected Ao outflow murmur.Creat up to 2.01 today. Will follow and help as required.

## 2012-09-03 NOTE — Progress Notes (Signed)
2 Days Post-Op Procedure(s) (LRB): REDO AORTIC VALVE REPLACEMENT (AVR) (N/A) TRICUSPID VALVE REPAIR (N/A) INTRAOPERATIVE TRANSESOPHAGEAL ECHOCARDIOGRAM (N/A) Subjective: Patient remains extubated with adequate oxygenation but with altered mental status-delirium. He is moving all extremities and will answer appropriately intermittently. He does have some right hand weakness which is probably related to the right axillary artery cannulation and brachial plexus stretch. His mental status has improved over the past 1218 hours with reducing narcotics. He is been able to get out of bed to chair and he has passed a SLT evaluation swallow study and is on a dysphagia diet.  Chest tubes continue to drain a fair amount of thin pleural fluid related to his right heart dysfunction. He is maintained on low-dose milrinone with CVP 12-14.  Urine output has been good but his creatinine is now 2.0  His underlying heart rhythm is a junctional versus A. fib which cannot be atrially paced. Underlying heart rate is p65- 70,the patient is being AV paced at 90. The patient was on Coumadin preoperatively but is on hold so far because of significant chest tube drainage. Platelet count is 63,000 and and Lovenox has not been started  Objective: Vital signs in last 24 hours: Temp:  [97.6 F (36.4 C)-98.9 F (37.2 C)] 98.8 F (37.1 C) (06/26 1221) Pulse Rate:  [86-102] 88 (06/26 1530) Cardiac Rhythm:  [-] A-V Sequential paced (06/26 0800) Resp:  [16-27] 21 (06/26 1530) BP: (99-165)/(48-106) 138/65 mmHg (06/26 1530) SpO2:  [94 %-100 %] 100 % (06/26 1530) Weight:  [183 lb 13.8 oz (83.4 kg)] 183 lb 13.8 oz (83.4 kg) (06/26 0400)  Hemodynamic parameters for last 24 hours: CVP:  [11 mmHg-12 mmHg] 12 mmHg  Intake/Output from previous day: 06/25 0701 - 06/26 0700 In: 2191.2 [I.V.:1328.7; Blood:362.5; IV Piggyback:500] Out: 3180 [Urine:1840; Emesis/NG output:200; Chest Tube:1140] Intake/Output this shift: Total I/O In:  471.2 [I.V.:471.2] Out: 1700 [Urine:1350; Chest Tube:350]  Exam Moves all extremities but confused Breath sounds clear bilaterally Extremities warm Soft flow systolic murmur through aortic valve No TR noted on CVP tracing  Lab Results:  Recent Labs  09/02/12 1700 09/03/12 0411 09/03/12 1512  WBC 13.7* 15.3*  --   HGB 7.6* 8.2* 8.2*  HCT 23.5* 24.9* 24.0*  PLT 80* 63*  --    BMET:  Recent Labs  09/02/12 1700 09/03/12 0411 09/03/12 1512  NA 139 138 140  K 4.2 4.2 3.9  CL 104 105  --   CO2 19 22  --   GLUCOSE 158* 143* 159*  BUN 24* 27*  --   CREATININE 1.81* 2.01*  --   CALCIUM 8.9 8.5  --     PT/INR:  Recent Labs  09/03/12 0411  LABPROT 15.5*  INR 1.26   ABG    Component Value Date/Time   PHART 7.424 09/03/2012 0420   HCO3 21.6 09/03/2012 0420   TCO2 23 09/03/2012 0420   ACIDBASEDEF 2.0 09/03/2012 0420   O2SAT 92.0 09/03/2012 0420   CBG (last 3)   Recent Labs  09/03/12 0418 09/03/12 0804 09/03/12 1223  GLUCAP 135* 147* 148*    Assessment/Plan: S/P Procedure(s) (LRB): REDO AORTIC VALVE REPLACEMENT (AVR) (N/A) TRICUSPID VALVE REPAIR (N/A) INTRAOPERATIVE TRANSESOPHAGEAL ECHOCARDIOGRAM (N/A) Plan:  Physical therapy, mobilize out of bed Diuresis with continued milrinone for right heart dysfunction Continued AV paced-decision regarding pacemaker will determine on his intrinsic heart rate over the next several days. Follow low platelet count- hold Coumadin for now   LOS: 2 days    VAN TRIGT  III,Aileana Hodder 09/03/2012

## 2012-09-03 NOTE — Evaluation (Signed)
Clinical/Bedside Swallow Evaluation Patient Details  Name: Ethan Lawrence MRN: [id number] Date of Birth: Mar 04, 1923  Today's Date: 09/03/2012 Time: 1130-1204 SLP Time Calculation (min): 34 min  Past Medical History:  Past Medical History  Diagnosis Date  . Hypertension   . Hyperlipidemia   . CAD (coronary artery disease)   . Atrial fib/flutter, transient   . Myocardial infarction   . Complication of anesthesia     Prefers depravant  . Macular degeneration    Past Surgical History:  Past Surgical History  Procedure Laterality Date  . Endovascular stent insertion  07/05/2010  . Appendectomy    . Coronary artery bypass graft    . Vascular surgery    . Back surgery    . Cardiac catheterization    . Esophagogastroduodenoscopy  06/19/2011    Procedure: ESOPHAGOGASTRODUODENOSCOPY (EGD);  Surgeon: Graylin Shiver, MD;  Location: [facility] OR;  Service: Gastroenterology;  Laterality: N/A;  . Colonoscopy  06/19/2011    Procedure: COLONOSCOPY;  Surgeon: Graylin Shiver, MD;  Location: [facility] OR;  Service: Gastroenterology;  Laterality: N/A;  . Eye surgery      bilater cataracts removed  . Aortic valve replacement N/A 09/01/2012    Procedure: REDO AORTIC VALVE REPLACEMENT (AVR);  Surgeon: Kerin Perna, MD;  Location: [facility] OR;  Service: Open Heart Surgery;  Laterality: N/A;  . Tricuspid valve replacement N/A 09/01/2012    Procedure: TRICUSPID VALVE REPAIR;  Surgeon: Kerin Perna, MD;  Location: [facility] OR;  Service: Open Heart Surgery;  Laterality: N/A;  . Intraoperative transesophageal echocardiogram N/A 09/01/2012    Procedure: INTRAOPERATIVE TRANSESOPHAGEAL ECHOCARDIOGRAM;  Surgeon: Kerin Perna, MD;  Location: [facility] OR;  Service: Open Heart Surgery;  Laterality: N/A;   HPI:  79 year old male admitted 09/01/12 for AVR and Tricuspid valve repair.  Pt intubated 6/24-25/14.  BSE ordered to evaluate swallow safety prior to beginning po intake.  Per RN, pt tolerating trials of ice chips, and meds with  water.   Assessment / Plan / Recommendation Clinical Impression  Pt progressing well s/p extubation yesterday. Oral motor strength and function appear WFL. Pt tolerated oral care, and placement of upper dentures.  No overt s/s aspiration observed or reported on any consistency tested.  Recommend beginning with soft diet/chopped meats (for energy conservation) and thin liquids.  Precautions posted at [facility]. RN and family aware.    Aspiration Risk  Mild    Diet Recommendation Dysphagia 3 (Mechanical Soft);Thin liquid (chop meats)   Liquid Administration via: Cup;Straw Medication Administration: Whole meds with liquid Supervision: Full supervision/cueing for compensatory strategies;Staff feed patient Compensations: Slow rate;Small sips/bites Postural Changes and/or Swallow Maneuvers: Seated upright 90 degrees;Upright 30-60 min after meal    Other  Recommendations Oral Care Recommendations: Oral care BID Other Recommendations: Clarify dietary restrictions   Follow Up Recommendations  24 hour supervision/assistance    Frequency and Duration min 1 x/week  1 week   Pertinent Vitals/Pain None reported.    SLP Swallow Goals Patient will consume recommended diet without observed clinical signs of aspiration with: Moderate assistance Swallow Study Goal #1 - Progress: Progressing toward goal Patient will utilize recommended strategies during swallow to increase swallowing safety with: Moderate assistance Swallow Study Goal #2 - Progress: Progressing toward goal   Swallow Study Prior Functional Status  Cognitive/Linguistic Baseline: Baseline deficits Type of Home: House  Lives With: Spouse    General Date of Onset: 09/02/12 HPI: 79 year old male admitted 09/01/12 for AVR and Tricuspid valve repair.  Pt intubated 6/24-25/14.  BSE ordered to evaluate swallow safety prior to beginning po intake.  Per RN, pt tolerating trials of ice chips, and meds with water. Type of Study: Bedside swallow  evaluation Diet Prior to this Study: NPO Temperature Spikes Noted: No Respiratory Status: Supplemental O2 delivered via (comment) (Marydel@4L ) History of Recent Intubation: Yes Length of Intubations (days): 2 days Behavior/Cognition: Alert;Cooperative;Confused;Distractible Oral Cavity - Dentition: Dentures, top;Missing dentition Self-Feeding Abilities: Total assist Patient Positioning: Upright in chair Baseline Vocal Quality: Clear;Low vocal intensity Volitional Cough: Cognitively unable to elicit Volitional Swallow: Unable to elicit    Oral/Motor/Sensory Function Overall Oral Motor/Sensory Function: Appears within functional limits for tasks assessed   Ice Chips Ice chips: Within functional limits Presentation: Spoon   Thin Liquid Thin Liquid: Within functional limits Presentation: Straw    Nectar Thick Nectar Thick Liquid: Not tested   Honey Thick Honey Thick Liquid: Not tested   Puree Puree: Within functional limits Presentation: Spoon   Solid   Celia B. Murvin Natal [facility], CCC-SLP [phone]     Solid: Within functional limits       Leigh Aurora 09/03/2012,12:15 PM

## 2012-09-03 NOTE — Care Management Note (Unsigned)
Page 1 of 2   09/10/2012     3:39:43 PM   CARE MANAGEMENT NOTE 09/10/2012  Patient:  Ethan Lawrence, Ethan Lawrence   Account Number:  [id number]  Date Initiated:  09/02/2012  Documentation initiated by:  [facility]  Subjective/Objective Assessment:   Post op AVR.     Action/Plan:   Anticipated DC Date:  09/07/2012   Anticipated DC Plan:  HOME W HOME HEALTH SERVICES      DC Planning Services  CM consult      [facility] Choice  HOME HEALTH   Choice offered to / List presented to:  C-1 Patient   DME arranged  HOSPITAL BED      DME agency  [facility]     HH arranged  HH-1 RN  HH-2 PT      [facility] agency  [facility]   Status of service:  In process, will continue to follow Medicare Important Message given?   (If response is "NO", the following Medicare IM given date fields will be blank) Date Medicare IM given:   Date Additional Medicare IM given:    Discharge Disposition:    Per UR Regulation:  Reviewed for med. necessity/level of care/duration of stay  If discussed at Long Length of Stay Meetings, dates discussed:    Comments:  Contact:  Ethan Lawrence,Ethan Daughter [phone]                Lawrence,Ethan Relative     [phone]  09/10/12 Ethan Jacquot,RN,BSN [id number] MET WITH PT TO DISCUSS DC NEEDS.  HHRN AND HHPT ORDERED; PT/FAMILY DESIRE [facility] FOR HOME CARE.  PT REQUESTS  HOSPITAL BED FOR HOME.  STATES HE HAS NO OTHER NEEDS.  SPOKE WITH DAUGHTER Ethan BY PHONE [phone]), AND SHE CONFIRMS THAT THEY WOULD LIKE A HOSP BED FOR HOME, AND IS AGREEABLE WITH HOME CARE ARRANGEMENTS.  WILL ORDER BED THROUGH [facility], PER INSURANCE CONTRACTS FOR DME.  FAXED REFERRAL TO [id number]. PT/DTR VERY APPRECIATIVE OF HELP.  REFERRAL TO [facility] FOR HH FOLLOW UP.  09-08-12 - 3:40pm  Ethan Lawrence, RNBSN [phone] Patient had pacemaker yesterday - tx to 2000, tele today. Talked with daughter Ethan Lawrence who is at home with her mom. Mom was in the ER this w/e post  fall and now is unable to get up or ambulate by self and has HH at home with them. Concerned about caring for two patients at home post discharge.  DAughter has ambivilent feelings about her caring for both also, as both her and her husband are getting tired. Discussed rehab facility for dad, but daughter feels mom is the one that she would like to place in SNF.  Advised to talk to [facility] agency, which is [facility], about placement for mom from home.  CM will continue to follow for discharge needs.  09-07-12 8:50am Ethan Lawrence, RNBSN [phone] Remains in ICU - Milrinone off. confusion better.  May need Pacemaker.  09-03-12 10am Ethan Lawrence, RNBSN [phone] patient actually lives in [place], owns and operates a store and lives with his wife who has dementia.  Friends in [place] assist with store and care of wife.  They are here now with daughter Mal [name] and her retired husband who is home 24/7.  Patient is a little confused, however daughter states he is better than he was earlier.  Plan is to take home to their house on discharge and be there for them as long as needed.  He also  has a dog"buster" that he adores. CM will continue to follow for discharge needs.

## 2012-09-03 NOTE — Progress Notes (Signed)
PT Cancellation Note  Patient Details Name: Ethan Lawrence MRN: [id number] DOB: February 11, 1923   Cancelled Treatment:    Reason Eval/Treat Not Completed: Medical issues which prohibited therapy. Nursing, Liborio Nixon stated that pt was too confused to participate in therapy.  Pt just up with nursing to the chair.  Will return tomorrow.     INGOLD,Laryah Neuser 09/03/2012, 11:31 AM  [facility] [phone] (pager)

## 2012-09-04 ENCOUNTER — Inpatient Hospital Stay (HOSPITAL_COMMUNITY): Payer: Medicare PPO

## 2012-09-04 LAB — COMPREHENSIVE METABOLIC PANEL
ALT: 14 U/L (ref 0–53)
AST: 28 U/L (ref 0–37)
Albumin: 2.9 g/dL — ABNORMAL LOW (ref 3.5–5.2)
Alkaline Phosphatase: 89 U/L (ref 39–117)
BUN: 32 mg/dL — ABNORMAL HIGH (ref 6–23)
CO2: 25 mEq/L (ref 19–32)
Calcium: 8.3 mg/dL — ABNORMAL LOW (ref 8.4–10.5)
Chloride: 103 mEq/L (ref 96–112)
Creatinine, Ser: 1.79 mg/dL — ABNORMAL HIGH (ref 0.50–1.35)
GFR calc Af Amer: 37 mL/min — ABNORMAL LOW (ref >90)
GFR calc non Af Amer: 32 mL/min — ABNORMAL LOW (ref >90)
Glucose, Bld: 105 mg/dL — ABNORMAL HIGH (ref 70–99)
Potassium: 3.8 mEq/L (ref 3.5–5.1)
Sodium: 137 mEq/L (ref 135–145)
Total Bilirubin: 0.6 mg/dL (ref 0.3–1.2)
Total Protein: 5.5 g/dL — ABNORMAL LOW (ref 6.0–8.3)

## 2012-09-04 LAB — GLUCOSE, CAPILLARY
Glucose-Capillary: 102 mg/dL — ABNORMAL HIGH (ref 70–99)
Glucose-Capillary: 105 mg/dL — ABNORMAL HIGH (ref 70–99)
Glucose-Capillary: 112 mg/dL — ABNORMAL HIGH (ref 70–99)
Glucose-Capillary: 116 mg/dL — ABNORMAL HIGH (ref 70–99)
Glucose-Capillary: 117 mg/dL — ABNORMAL HIGH (ref 70–99)
Glucose-Capillary: 121 mg/dL — ABNORMAL HIGH (ref 70–99)
Glucose-Capillary: 91 mg/dL (ref 70–99)

## 2012-09-04 LAB — CBC
HCT: 26.5 % — ABNORMAL LOW (ref 39.0–52.0)
Hemoglobin: 8.4 g/dL — ABNORMAL LOW (ref 13.0–17.0)
MCH: 26.4 pg (ref 26.0–34.0)
MCHC: 31.7 g/dL (ref 30.0–36.0)
MCV: 83.3 fL (ref 78.0–100.0)
Platelets: 58 10*3/uL — ABNORMAL LOW (ref 150–400)
RBC: 3.18 MIL/uL — ABNORMAL LOW (ref 4.22–5.81)
RDW: 19.7 % — ABNORMAL HIGH (ref 11.5–15.5)
WBC: 12.5 10*3/uL — ABNORMAL HIGH (ref 4.0–10.5)

## 2012-09-04 MED ORDER — HYDROCODONE-ACETAMINOPHEN 5-325 MG PO TABS
1.0000 | ORAL_TABLET | Freq: Four times a day (QID) | ORAL | Status: DC | PRN
  Administered 2012-09-04 – 2012-09-13 (×21): 1 via ORAL
  Filled 2012-09-04 (×21): qty 1

## 2012-09-04 MED ORDER — SODIUM CHLORIDE 0.9 % IJ SOLN: 10.0000 mL | INTRAMUSCULAR | Status: DC | PRN

## 2012-09-04 MED ORDER — SODIUM CHLORIDE 0.9 % IJ SOLN
10.0000 mL | Freq: Two times a day (BID) | INTRAMUSCULAR | Status: DC
  Administered 2012-09-04: 20 mL
  Administered 2012-09-04 – 2012-09-06 (×5): 10 mL

## 2012-09-04 MED ORDER — ROPINIROLE HCL 1 MG PO TABS
2.0000 mg | ORAL_TABLET | Freq: Every day | ORAL | Status: DC
  Administered 2012-09-04 – 2012-09-12 (×8): 2 mg via ORAL
  Filled 2012-09-04 (×11): qty 2

## 2012-09-04 NOTE — Progress Notes (Signed)
More alert.  AV pacing. Creat down to 1.79  Overall, improving.

## 2012-09-04 NOTE — Progress Notes (Signed)
Patient ID: Ethan Lawrence, male   DOB: [birth year], 79 y.o.   MRN: [id number] TCTS DAILY ICU PROGRESS NOTE                   [address]            [place] [address]          [phone]   3 Days Post-Op Procedure(s) (LRB): REDO AORTIC VALVE REPLACEMENT (AVR) (N/A) TRICUSPID VALVE REPAIR (N/A) INTRAOPERATIVE TRANSESOPHAGEAL ECHOCARDIOGRAM (N/A)  Total Length of Stay:  LOS: 3 days   Subjective: Less confused, walked 75 feet. Needed restraints yesterday, better today  Objective: Vital signs in last 24 hours: Temp:  [97.5 F (36.4 C)-99.6 F (37.6 C)] 98.8 F (37.1 C) (06/27 0802) Pulse Rate:  [88-104] 104 (06/27 1000) Cardiac Rhythm:  [-] A-V Sequential paced (06/26 2000) Resp:  [18-30] 19 (06/27 1000) BP: (101-154)/(50-88) 153/73 mmHg (06/27 0900) SpO2:  [91 %-100 %] 91 % (06/27 1000) Weight:  [170 lb 13.7 oz (77.5 kg)] 170 lb 13.7 oz (77.5 kg) (06/27 0600)  Filed Weights   09/02/12 0600 09/03/12 0400 09/04/12 0600  Weight: 182 lb 12.2 oz (82.9 kg) 183 lb 13.8 oz (83.4 kg) 170 lb 13.7 oz (77.5 kg)    Weight change: -13 lb 0.1 oz (-5.9 kg)   Hemodynamic parameters for last 24 hours:    Intake/Output from previous day: 06/26 0701 - 06/27 0700 In: 965.8 [P.O.:120; I.V.:845.8] Out: 3155 [Urine:2415; Chest Tube:740]  Intake/Output this shift:    Current Meds: Scheduled Meds: . acetaminophen  1,000 mg Oral Q6H   Or  . acetaminophen (TYLENOL) oral liquid 160 mg/5 mL  975 mg Per Tube Q6H  . aspirin EC  325 mg Oral Daily   Or  . aspirin  324 mg Per Tube Daily  . bisacodyl  10 mg Oral Daily   Or  . bisacodyl  10 mg Rectal Daily  . Chlorhexidine Gluconate Cloth  6 each Topical Daily  . docusate sodium  200 mg Oral Daily  . furosemide  40 mg Intravenous Once  . furosemide  40 mg Intravenous Daily  . gabapentin  300 mg Oral BID  . insulin aspart  0-24 Units Subcutaneous Q4H  . mupirocin ointment  1 application Nasal BID  . pantoprazole  40  mg Oral Daily  . simvastatin  20 mg Oral q1800  . sodium chloride  3 mL Intravenous Q12H  . thiamine  100 mg Intravenous Daily   Continuous Infusions: . sodium chloride 20 mL/hr at 09/04/12 0600  . sodium chloride 20 mL/hr (09/02/12 0800)  . sodium chloride 20 mL/hr at 09/03/12 0400  . milrinone 0.2 mcg/kg/min (09/04/12 0600)  . nitroGLYCERIN Stopped (09/03/12 1400)   PRN Meds:.fentaNYL, ondansetron (ZOFRAN) IV, sodium chloride, traMADol  General appearance: cooperative, appears stated age, distracted, fatigued and slowed mentation Neurologic: intact Heart: no click, no rub and av paced Lungs: diminished breath sounds bibasilar Abdomen: soft, non-tender; bowel sounds normal; no masses,  no organomegaly Extremities: extremities normal, atraumatic, no cyanosis or edema and Homans sign is negative, no sign of DVT Wound: bilaterial ct draining 200 ml shift  Lab Results: CBC: Recent Labs  09/03/12 0411 09/03/12 1512 09/04/12 0400  WBC 15.3*  --  12.5*  HGB 8.2* 8.2* 8.4*  HCT 24.9* 24.0* 26.5*  PLT 63*  --  58*   BMET:  Recent Labs  09/03/12 0411 09/03/12 1512 09/04/12 0400  NA 138 140 137  K 4.2  3.9 3.8  CL 105  --  103  CO2 22  --  25  GLUCOSE 143* 159* 105*  BUN 27*  --  32*  CREATININE 2.01*  --  1.79*  CALCIUM 8.5  --  8.3*    PT/INR:  Recent Labs  09/03/12 0411  LABPROT 15.5*  INR 1.26   Radiology: Dg Chest Port 1 View  09/04/2012   *RADIOLOGY REPORT*  Clinical Data: 79 year old male status post cardiac surgery. Review aortic valve replacement, tricuspid valve repair.  PORTABLE CHEST - 1 VIEW  Comparison: 09/03/2012 and earlier.  Findings: AP view at 0613 hours.  Right IJ introducer sheath remains in place, but the Swan-Ganz catheters been removed.  The patient is not intubated.  Stable bilateral chest tubes.  No pneumothorax identified.  Stable pulmonary vascularity.  Stable cardiac size and mediastinal contours.  Right axillary surgical clips.  Continued  retrocardiac opacity, favor atelectasis.  IMPRESSION: 1.  Swan-Ganz catheter removed.  Right IJ introducer sheath and bilateral chest tubes remain in place. 2.  No pneumothorax.  Stable ventilation.   Original Report Authenticated By: Erskine Speed, M.D.   Dg Chest Port 1 View  09/03/2012   *RADIOLOGY REPORT*  Clinical Data: Post aortic valve replacement  PORTABLE CHEST - 1 VIEW  Comparison: 09/02/2012; 09/02/1926  Findings:  Grossly unchanged enlarged cardiac silhouette and mediastinal contours.  Interval extubation and removal of enteric tube. Otherwise, stable positioning of remaining support apparatus.  No pneumothorax.  Pulmonary vasculature remains indistinct. Minimally improved aeration of the left lung base with persistent perihilar heterogeneous opacities.  No definite pleural effusion.  Skin staples overlie the right axilla.  Surgical clips overlie the left upper abdomen.  Unchanged bones.  IMPRESSION: 1.  Interval extubation and removal of enteric tube.  Otherwise, stable positioning remain support apparatus.  No pneumothorax. 2.  Persistent findings of pulmonary edema. 3.  Improved aeration the left lower lung with persistent perihilar atelectasis here   Original Report Authenticated By: Tacey Ruiz, MD     Assessment/Plan: S/P Procedure(s) (LRB): REDO AORTIC VALVE REPLACEMENT (AVR) (N/A) TRICUSPID VALVE REPAIR (N/A) INTRAOPERATIVE TRANSESOPHAGEAL ECHOCARDIOGRAM (N/A) Mobilize Diuresis Continue foley due to strict I&O, patient in ICU and urinary output monitoring leave ct tubes in place with increased drainage Renal function slightly improved thrombocytopenia still present, hold coumadin and heparin   Marinus Eicher B 09/04/2012 10:02 AM

## 2012-09-04 NOTE — Progress Notes (Signed)
Peripherally Inserted Central Catheter/Midline Placement  The IV Nurse has discussed with the patient and/or persons authorized to consent for the patient, the purpose of this procedure and the potential benefits and risks involved with this procedure.  The benefits include less needle sticks, lab draws from the catheter and patient may be discharged home with the catheter.  Risks include, but not limited to, infection, bleeding, blood clot (thrombus formation), and puncture of an artery; nerve damage and irregular heat beat.  Alternatives to this procedure were also discussed.  PICC/Midline Placement Documentation        Ethan Lawrence 09/04/2012, 1:18 PM

## 2012-09-04 NOTE — Progress Notes (Signed)
Patient ID: Ethan Lawrence, male   DOB: 10/15/1922, 79 y.o.   MRN: [id number] EVENING ROUNDS NOTE :     [address]       [place] [address]             [phone]                 3 Days Post-Op Procedure(s) (LRB): REDO AORTIC VALVE REPLACEMENT (AVR) (N/A) TRICUSPID VALVE REPAIR (N/A) INTRAOPERATIVE TRANSESOPHAGEAL ECHOCARDIOGRAM (N/A)  Total Length of Stay:  LOS: 3 days  BP 140/78  Pulse 88  Temp(Src) 97.5 F (36.4 C) (Oral)  Resp 20  Ht 5\' 9"  (1.753 m)  Wt 170 lb 13.7 oz (77.5 kg)  BMI 25.22 kg/m2  SpO2 99%  .Intake/Output     06/26 0701 - 06/27 0700 06/27 0701 - 06/28 0700   P.O. 120 720   I.V. (mL/kg) 845.8 (10.9) 275.2 (3.6)   Blood     IV Piggyback     Total Intake(mL/kg) 965.8 (12.5) 995.2 (12.8)   Urine (mL/kg/hr) 2415 (1.3) 2425 (2.6)   Emesis/NG output     Chest Tube 740 (0.4) 470 (0.5)   Total Output 3155 2895   Net -2189.2 -1899.8        Urine Occurrence  1 x     . sodium chloride 20 mL/hr at 09/04/12 1400  . sodium chloride 20 mL/hr (09/02/12 0800)  . sodium chloride 20 mL/hr at 09/03/12 0400  . milrinone 0.2 mcg/kg/min (09/04/12 1800)  . nitroGLYCERIN Stopped (09/03/12 1400)     Lab Results  Component Value Date   WBC 12.5* 09/04/2012   HGB 8.4* 09/04/2012   HCT 26.5* 09/04/2012   PLT 58* 09/04/2012   GLUCOSE 105* 09/04/2012   ALT 14 09/04/2012   AST 28 09/04/2012   NA 137 09/04/2012   K 3.8 09/04/2012   CL 103 09/04/2012   CREATININE 1.79* 09/04/2012   BUN 32* 09/04/2012   CO2 25 09/04/2012   INR 1.26 09/03/2012   HGBA1C 5.7* 08/27/2012   Still paced D/c rt ij, pic now in Rt arm sore, good radial pulse  Delight Ovens MD  Beeper [phone] Office [phone] 09/04/2012 6:50 PM

## 2012-09-04 NOTE — Evaluation (Signed)
Physical Therapy Evaluation Patient Details Name: Ethan Lawrence MRN: [id number] DOB: Mar 21, 1922 Today's Date: 09/04/2012 Time: 4098-1191 PT Time Calculation (min): 24 min  PT Assessment / Plan / Recommendation History of Present Illness  Pt admitted for redo AVR, tricuspid valve repair with post-op VDRF and confusion. Pt still works daily and cares for wife who has dementia  Clinical Impression  Pt demonstrates decreased mobility, balance and cognition which will benefit from acute therapy to maximize mobility, safety and independence to decrease burden of care.     PT Assessment  Patient needs continued PT services    Follow Up Recommendations  Home health PT;Supervision/Assistance - 24 hour    Does the patient have the potential to tolerate intense rehabilitation      Barriers to Discharge        Equipment Recommendations  None recommended by PT    Recommendations for Other Services     Frequency Min 3X/week    Precautions / Restrictions Precautions Precautions: Sternal;Fall   Pertinent Vitals/Pain 8/10 pain at incision after activity repositioned      Mobility  Bed Mobility Bed Mobility: Not assessed Details for Bed Mobility Assistance: Pt in recliner before and after session Transfers Transfers: Sit to Stand;Stand to Sit Sit to Stand: 3: Mod assist;From chair/3-in-1 Stand to Sit: 4: Min assist Details for Transfer Assistance: cueing for hand placement on thighs and anterior weight shift with assist to complete Ambulation/Gait Ambulation/Gait Assistance: 1: +2 Total assist Ambulation/Gait: Patient Percentage: 80% Ambulation Distance (Feet): 75 Feet Assistive device: Rolling walker Ambulation/Gait Assistance Details: cueing to step into RW, extend trunk and assist for maintenance of lines with chair to follow Gait Pattern: Step-through pattern;Decreased stride length Gait velocity: decreased Stairs: No    Exercises     PT Diagnosis: Difficulty  walking;Acute pain;Altered mental status  PT Problem List: Decreased strength;Decreased activity tolerance;Decreased mobility;Decreased cognition;Decreased knowledge of use of DME;Pain PT Treatment Interventions: Gait training;Functional mobility training;Stair training;DME instruction;Therapeutic activities;Patient/family education;Cognitive remediation     PT Goals(Current goals can be found in the care plan section) Acute Rehab PT Goals Patient Stated Goal: get back to work PT Goal Formulation: With patient Time For Goal Achievement: 09/18/12 Potential to Achieve Goals: Good  Visit Information  Last PT Received On: 09/04/12 Assistance Needed: +2 (lines and safety) History of Present Illness: Pt admitted for redo AVR, tricuspid valve repair with post-op VDRF and confusion. Pt still works daily and cares for wife who has dementia       Prior Functioning  Home Living Family/patient expects to be discharged to:: Private residence Living Arrangements: Spouse/significant other;Children Available Help at Discharge: Family;Available 24 hours/day Type of Home: House Home Access: Stairs to enter [employer] of Steps: 3 Entrance Stairs-Rails: Right Home Layout: One level Home Equipment: Bedside commode;Cane - single point;Walker - 2 wheels;Shower seat  Lives With: Spouse Prior Function Level of Independence: Independent Comments: Pt still works full time at his Scientist, 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, advertising Arousal/Alertness: Awake/alert Behavior During Therapy: WFL for tasks assessed/performed Overall Cognitive Status: Impaired/Different from baseline Area of Impairment: Orientation;Safety/judgement Orientation Level: Time Memory: Decreased recall of precautions Safety/Judgement: Decreased awareness of safety    Extremity/Trunk Assessment Upper Extremity Assessment Upper Extremity Assessment: Overall WFL for tasks  assessed Lower Extremity Assessment Lower Extremity Assessment: Generalized weakness Cervical / Trunk Assessment Cervical / Trunk Assessment: Normal   Balance    End of Session PT - End of Session Equipment Utilized During  Treatment: Gait belt;Oxygen Activity Tolerance: Patient tolerated treatment well Patient left: in chair;with call bell/phone within reach;with nursing/sitter in room Nurse Communication: Mobility status  GP     Delorse Lek 09/04/2012, 1:44 PM Delaney Meigs, PT [phone]

## 2012-09-04 NOTE — Progress Notes (Signed)
Speech Language Pathology Dysphagia Treatment Patient Details Name: Ethan Lawrence MRN: [id number] DOB: 05-21-1922 Today's Date: 09/04/2012 Time: 4098-1191 SLP Time Calculation (min): 30 min  Assessment / Plan / Recommendation Clinical Impression  Treatment session focused on addressing dysphagia goals with direct observation of patient consuming puree solids, thin liquids, and pills (administered by RN). Patient followed swallow precautions with supervision and minimal cues. No overt s/s aspiration noted with PO intake. Patient denied any pain, or difficulty with swallowing, however did report that his appetite has been poor. SLP changed recommendation of full supervision to 'full supervision with meals, intermittent supervision with liquids'.     Diet Recommendation  Continue with Current Diet: Dysphagia 2 (fine chop) Initiate / Change Diet: Thin liquid    SLP Plan Continue with current plan of care   Pertinent Vitals/Pain    Swallowing Goals  SLP Swallowing Goals Swallow Study Goal #1 - Progress: Progressing toward goal  General Temperature Spikes Noted: No Respiratory Status: Supplemental O2 delivered via (comment) (via nasal cannula) Behavior/Cognition: Alert;Cooperative;Pleasant mood Oral Cavity - Dentition: Dentures, top;Missing dentition Patient Positioning: Upright in chair  Oral Cavity - Oral Hygiene     Dysphagia Treatment Treatment focused on: Skilled observation of diet tolerance Treatment Methods/Modalities: Skilled observation Patient observed directly with PO's: Yes Type of PO's observed: Dysphagia 1 (puree);Thin liquids Feeding: Able to feed self;Needs set up Liquids provided via: Cup;Straw Type of cueing: Verbal Amount of cueing: Minimal   GO     Ethan Lawrence 09/04/2012, 12:07 PM  Angela Nevin, MA, CCC-SLP [facility] Speech-Language Pathologist

## 2012-09-05 ENCOUNTER — Inpatient Hospital Stay (HOSPITAL_COMMUNITY): Payer: Medicare PPO

## 2012-09-05 LAB — CBC
HCT: 23.9 % — ABNORMAL LOW (ref 39.0–52.0)
Hemoglobin: 7.6 g/dL — ABNORMAL LOW (ref 13.0–17.0)
MCH: 26.5 pg (ref 26.0–34.0)
MCHC: 31.8 g/dL (ref 30.0–36.0)
MCV: 83.3 fL (ref 78.0–100.0)
Platelets: 68 10*3/uL — ABNORMAL LOW (ref 150–400)
RBC: 2.87 MIL/uL — ABNORMAL LOW (ref 4.22–5.81)
RDW: 19.5 % — ABNORMAL HIGH (ref 11.5–15.5)
WBC: 9 10*3/uL (ref 4.0–10.5)

## 2012-09-05 LAB — GLUCOSE, CAPILLARY
Glucose-Capillary: 101 mg/dL — ABNORMAL HIGH (ref 70–99)
Glucose-Capillary: 90 mg/dL (ref 70–99)
Glucose-Capillary: 118 mg/dL — ABNORMAL HIGH (ref 70–99)
Glucose-Capillary: 107 mg/dL — ABNORMAL HIGH (ref 70–99)

## 2012-09-05 LAB — COMPREHENSIVE METABOLIC PANEL
ALT: 11 U/L (ref 0–53)
AST: 15 U/L (ref 0–37)
Albumin: 2.7 g/dL — ABNORMAL LOW (ref 3.5–5.2)
Alkaline Phosphatase: 74 U/L (ref 39–117)
BUN: 38 mg/dL — ABNORMAL HIGH (ref 6–23)
CO2: 27 mEq/L (ref 19–32)
Calcium: 8.2 mg/dL — ABNORMAL LOW (ref 8.4–10.5)
Chloride: 100 mEq/L (ref 96–112)
Creatinine, Ser: 1.72 mg/dL — ABNORMAL HIGH (ref 0.50–1.35)
GFR calc Af Amer: 39 mL/min — ABNORMAL LOW (ref >90)
GFR calc non Af Amer: 34 mL/min — ABNORMAL LOW (ref >90)
Glucose, Bld: 106 mg/dL — ABNORMAL HIGH (ref 70–99)
Potassium: 3.4 mEq/L — ABNORMAL LOW (ref 3.5–5.1)
Sodium: 136 mEq/L (ref 135–145)
Total Bilirubin: 0.7 mg/dL (ref 0.3–1.2)
Total Protein: 5.3 g/dL — ABNORMAL LOW (ref 6.0–8.3)

## 2012-09-05 LAB — TYPE AND SCREEN
ABO/RH(D): O NEG
Antibody Screen: NEGATIVE
Unit division: 0
Unit division: 0
Unit division: 0
Unit division: 0
Unit division: 0
Unit division: 0

## 2012-09-05 MED ORDER — POTASSIUM CHLORIDE CRYS ER 20 MEQ PO TBCR
20.0000 meq | EXTENDED_RELEASE_TABLET | Freq: Once | ORAL | Status: AC
  Administered 2012-09-05: 20 meq via ORAL
  Filled 2012-09-05: qty 1

## 2012-09-05 NOTE — Progress Notes (Addendum)
TCTS DAILY ICU PROGRESS NOTE                   [address]            [place] [address]          [phone]   4 Days Post-Op Procedure(s) (LRB): REDO AORTIC VALVE REPLACEMENT (AVR) (N/A) TRICUSPID VALVE REPAIR (N/A) INTRAOPERATIVE TRANSESOPHAGEAL ECHOCARDIOGRAM (N/A)  Total Length of Stay:  LOS: 4 days   Subjective: Alert and cooperative, slept last night  Objective: Vital signs in last 24 hours: Temp:  [97.5 F (36.4 C)-99.7 F (37.6 C)] 98.5 F (36.9 C) (06/28 0803) Pulse Rate:  [87-104] 90 (06/28 0600) Cardiac Rhythm:  [-] A-V Sequential paced (06/27 2100) Resp:  [12-25] 21 (06/28 0600) BP: (85-158)/(50-85) 140/73 mmHg (06/28 0600) SpO2:  [91 %-100 %] 98 % (06/28 0600) Weight:  [166 lb 0.1 oz (75.3 kg)] 166 lb 0.1 oz (75.3 kg) (06/28 0600)  Filed Weights   09/03/12 0400 09/04/12 0600 09/05/12 0600  Weight: 183 lb 13.8 oz (83.4 kg) 170 lb 13.7 oz (77.5 kg) 166 lb 0.1 oz (75.3 kg)    Weight change: -4 lb 13.6 oz (-2.2 kg)   Hemodynamic parameters for last 24 hours:    Intake/Output from previous day: 06/27 0701 - 06/28 0700 In: 1290.4 [P.O.:720; I.V.:570.4] Out: 3755 [Urine:3025; Chest Tube:730]  Intake/Output this shift:    Current Meds: Scheduled Meds: . acetaminophen  1,000 mg Oral Q6H   Or  . acetaminophen (TYLENOL) oral liquid 160 mg/5 mL  975 mg Per Tube Q6H  . aspirin EC  325 mg Oral Daily   Or  . aspirin  324 mg Per Tube Daily  . bisacodyl  10 mg Oral Daily   Or  . bisacodyl  10 mg Rectal Daily  . Chlorhexidine Gluconate Cloth  6 each Topical Daily  . docusate sodium  200 mg Oral Daily  . furosemide  40 mg Intravenous Once  . furosemide  40 mg Intravenous Daily  . gabapentin  300 mg Oral BID  . insulin aspart  0-24 Units Subcutaneous Q4H  . mupirocin ointment  1 application Nasal BID  . pantoprazole  40 mg Oral Daily  . rOPINIRole  2 mg Oral QHS  . simvastatin  20 mg Oral q1800  . sodium chloride  10-40 mL  Intracatheter Q12H  . sodium chloride  3 mL Intravenous Q12H  . thiamine  100 mg Intravenous Daily   Continuous Infusions: . sodium chloride 20 mL/hr at 09/04/12 1400  . sodium chloride 20 mL/hr (09/02/12 0800)  . sodium chloride 20 mL/hr at 09/03/12 0400  . milrinone 0.2 mcg/kg/min (09/04/12 1900)  . nitroGLYCERIN Stopped (09/03/12 1400)   PRN Meds:.fentaNYL, HYDROcodone-acetaminophen, ondansetron (ZOFRAN) IV, sodium chloride, sodium chloride, traMADol  General appearance: alert and cooperative Neurologic: intact Heart: av paced Lungs: diminished breath sounds bibasilar Abdomen: soft, non-tender; bowel sounds normal; no masses,  no organomegaly Extremities: extremities normal, atraumatic, no cyanosis or edema and Homans sign is negative, no sign of DVT Wound: sternum stable  Lab Results: CBC: Recent Labs  09/04/12 0400 09/05/12 0355  WBC 12.5* 9.0  HGB 8.4* 7.6*  HCT 26.5* 23.9*  PLT 58* 68*   BMET:  Recent Labs  09/04/12 0400 09/05/12 0355  NA 137 136  K 3.8 3.4*  CL 103 100  CO2 25 27  GLUCOSE 105* 106*  BUN 32* 38*  CREATININE 1.79* 1.72*  CALCIUM 8.3* 8.2*  PT/INR:  Recent Labs  09/03/12 0411  LABPROT 15.5*  INR 1.26   Radiology: Dg Chest Port 1 View  09/04/2012   *RADIOLOGY REPORT*  Clinical Data: PICC line placement  PORTABLE CHEST - 1 VIEW  Comparison: 09/04/2012 at 6:13 a.m.  Findings: Right IJ introducer sheath:  Upper SVC.  Bilateral chest tubes noted.  Stable appearance of the prosthetic valves and CABG markers. No pneumothorax observed.  Left basilar atelectasis.  A new left-sided PICC line noted with tip at the cavoatrial junction.  Cardiomegaly noted with mildly improved edema.  IMPRESSION:  1.  PICC line tip:  Cavoatrial junction. 2.  Cardiomegaly with mildly improved edema.   Original Report Authenticated By: Gaylyn Rong, M.D.   Dg Chest Port 1 View  09/04/2012   *RADIOLOGY REPORT*  Clinical Data: 79 year old male status post cardiac  surgery. Review aortic valve replacement, tricuspid valve repair.  PORTABLE CHEST - 1 VIEW  Comparison: 09/03/2012 and earlier.  Findings: AP view at 0613 hours.  Right IJ introducer sheath remains in place, but the Swan-Ganz catheters been removed.  The patient is not intubated.  Stable bilateral chest tubes.  No pneumothorax identified.  Stable pulmonary vascularity.  Stable cardiac size and mediastinal contours.  Right axillary surgical clips.  Continued retrocardiac opacity, favor atelectasis.  IMPRESSION: 1.  Swan-Ganz catheter removed.  Right IJ introducer sheath and bilateral chest tubes remain in place. 2.  No pneumothorax.  Stable ventilation.   Original Report Authenticated By: Erskine Speed, M.D.     Assessment/Plan: S/P Procedure(s) (LRB): REDO AORTIC VALVE REPLACEMENT (AVR) (N/A) TRICUSPID VALVE REPAIR (N/A) INTRAOPERATIVE TRANSESOPHAGEAL ECHOCARDIOGRAM (N/A) Thrombocytopenia persists, but no bleeding, avoid heparin D/c foley and monitor for urinary retention Leave chest tubes, 250 from left 150 from rt over night PIC line placed  May need PPacer CR  Stable 1.7    Jammy Stlouis B 09/05/2012 8:04 AM

## 2012-09-06 ENCOUNTER — Inpatient Hospital Stay (HOSPITAL_COMMUNITY): Payer: Medicare PPO

## 2012-09-06 LAB — GLUCOSE, CAPILLARY
Glucose-Capillary: 100 mg/dL — ABNORMAL HIGH (ref 70–99)
Glucose-Capillary: 111 mg/dL — ABNORMAL HIGH (ref 70–99)
Glucose-Capillary: 146 mg/dL — ABNORMAL HIGH (ref 70–99)
Glucose-Capillary: 175 mg/dL — ABNORMAL HIGH (ref 70–99)
Glucose-Capillary: 77 mg/dL (ref 70–99)
Glucose-Capillary: 92 mg/dL (ref 70–99)
Glucose-Capillary: 96 mg/dL (ref 70–99)
Glucose-Capillary: 97 mg/dL (ref 70–99)

## 2012-09-06 LAB — BASIC METABOLIC PANEL
BUN: 41 mg/dL — ABNORMAL HIGH (ref 6–23)
CO2: 27 mEq/L (ref 19–32)
Calcium: 8.2 mg/dL — ABNORMAL LOW (ref 8.4–10.5)
Chloride: 96 mEq/L (ref 96–112)
Creatinine, Ser: 1.59 mg/dL — ABNORMAL HIGH (ref 0.50–1.35)
GFR calc Af Amer: 43 mL/min — ABNORMAL LOW (ref >90)
GFR calc non Af Amer: 37 mL/min — ABNORMAL LOW (ref >90)
Glucose, Bld: 103 mg/dL — ABNORMAL HIGH (ref 70–99)
Potassium: 3.4 mEq/L — ABNORMAL LOW (ref 3.5–5.1)
Sodium: 132 mEq/L — ABNORMAL LOW (ref 135–145)

## 2012-09-06 LAB — CBC
HCT: 25.3 % — ABNORMAL LOW (ref 39.0–52.0)
Hemoglobin: 8.1 g/dL — ABNORMAL LOW (ref 13.0–17.0)
MCH: 26.5 pg (ref 26.0–34.0)
MCHC: 32 g/dL (ref 30.0–36.0)
MCV: 82.7 fL (ref 78.0–100.0)
Platelets: 86 10*3/uL — ABNORMAL LOW (ref 150–400)
RBC: 3.06 MIL/uL — ABNORMAL LOW (ref 4.22–5.81)
RDW: 19.1 % — ABNORMAL HIGH (ref 11.5–15.5)
WBC: 7.5 10*3/uL (ref 4.0–10.5)

## 2012-09-06 MED ORDER — VITAMIN B-1 100 MG PO TABS
100.0000 mg | ORAL_TABLET | Freq: Every day | ORAL | Status: DC
  Administered 2012-09-07 – 2012-09-12 (×6): 100 mg via ORAL
  Filled 2012-09-06 (×8): qty 1

## 2012-09-06 MED ORDER — POTASSIUM CHLORIDE CRYS ER 20 MEQ PO TBCR
20.0000 meq | EXTENDED_RELEASE_TABLET | Freq: Once | ORAL | Status: AC
  Administered 2012-09-06: 20 meq via ORAL
  Filled 2012-09-06: qty 1

## 2012-09-06 NOTE — Progress Notes (Signed)
TCTS DAILY ICU PROGRESS NOTE                   [address]            [place] [address]          [phone]   5 Days Post-Op Procedure(s) (LRB): REDO AORTIC VALVE REPLACEMENT (AVR) (N/A) TRICUSPID VALVE REPAIR (N/A) INTRAOPERATIVE TRANSESOPHAGEAL ECHOCARDIOGRAM (N/A)  Total Length of Stay:  LOS: 5 days   Subjective: Alert and cooperative, slept last night  Objective: Vital signs in last 24 hours: Temp:  [98.1 F (36.7 C)-99.1 F (37.3 C)] 99 F (37.2 C) (06/29 0733) Pulse Rate:  [87-92] 88 (06/29 0800) Cardiac Rhythm:  [-] A-V Sequential paced (06/29 0800) Resp:  [13-23] 16 (06/29 0800) BP: (89-169)/(53-132) 153/81 mmHg (06/29 0800) SpO2:  [86 %-100 %] 99 % (06/29 0800) Weight:  [165 lb 9.1 oz (75.1 kg)] 165 lb 9.1 oz (75.1 kg) (06/29 0500)  Filed Weights   09/04/12 0600 09/05/12 0600 09/06/12 0500  Weight: 170 lb 13.7 oz (77.5 kg) 166 lb 0.1 oz (75.3 kg) 165 lb 9.1 oz (75.1 kg)    Weight change: -7.1 oz (-0.2 kg)   Hemodynamic parameters for last 24 hours:    Intake/Output from previous day: 06/28 0701 - 06/29 0700 In: 1451.5 [P.O.:960; I.V.:491.5] Out: 2325 [Urine:1875; Chest Tube:450]  Intake/Output this shift: Total I/O In: 20 [I.V.:20] Out: 50 [Chest Tube:50]  Current Meds: Scheduled Meds: . acetaminophen  1,000 mg Oral Q6H   Or  . acetaminophen (TYLENOL) oral liquid 160 mg/5 mL  975 mg Per Tube Q6H  . aspirin EC  325 mg Oral Daily   Or  . aspirin  324 mg Per Tube Daily  . bisacodyl  10 mg Oral Daily   Or  . bisacodyl  10 mg Rectal Daily  . Chlorhexidine Gluconate Cloth  6 each Topical Daily  . docusate sodium  200 mg Oral Daily  . furosemide  40 mg Intravenous Once  . furosemide  40 mg Intravenous Daily  . gabapentin  300 mg Oral BID  . insulin aspart  0-24 Units Subcutaneous Q4H  . mupirocin ointment  1 application Nasal BID  . pantoprazole  40 mg Oral Daily  . rOPINIRole  2 mg Oral QHS  . simvastatin  20 mg Oral q1800    . sodium chloride  10-40 mL Intracatheter Q12H  . sodium chloride  3 mL Intravenous Q12H  . thiamine  100 mg Intravenous Daily   Continuous Infusions: . sodium chloride 20 mL/hr at 09/04/12 1400  . sodium chloride 20 mL/hr (09/02/12 0800)  . sodium chloride 20 mL/hr at 09/03/12 0400  . nitroGLYCERIN Stopped (09/03/12 1400)   PRN Meds:.fentaNYL, HYDROcodone-acetaminophen, ondansetron (ZOFRAN) IV, sodium chloride, sodium chloride, traMADol  General appearance: alert and cooperative Neurologic: intact Heart: av paced Lungs: diminished breath sounds bibasilar Abdomen: soft, non-tender; bowel sounds normal; no masses,  no organomegaly Extremities: extremities normal, atraumatic, no cyanosis or edema and Homans sign is negative, no sign of DVT Wound: sternum stable  Lab Results: CBC:  Recent Labs  09/05/12 0355 09/06/12 0336  WBC 9.0 7.5  HGB 7.6* 8.1*  HCT 23.9* 25.3*  PLT 68* 86*   BMET:   Recent Labs  09/05/12 0355 09/06/12 0336  NA 136 132*  K 3.4* 3.4*  CL 100 96  CO2 27 27  GLUCOSE 106* 103*  BUN 38* 41*  CREATININE 1.72* 1.59*  CALCIUM 8.2* 8.2*  PT/INR: No results found for this basename: LABPROT, INR,  in the last 72 hours Radiology: Dg Chest Port 1 View  09/05/2012   *RADIOLOGY REPORT*  Clinical Data: Postop  PORTABLE CHEST - 1 VIEW  Comparison: Yesterday  Findings: Right internal jugular vein introducer sheath and bilateral chest tubes are stable.  Left PICC is stable.  Low volumes.  Bilateral linear atelectasis is stable.  Pulmonary vascularity is within normal limits. No pneumothorax.  Mild interstitial edema is stable.  IMPRESSION: Stable bilateral linear atelectasis. Stable mild edema.   Original Report Authenticated By: Jolaine Click, M.D.   Dg Chest Port 1 View  09/04/2012   *RADIOLOGY REPORT*  Clinical Data: PICC line placement  PORTABLE CHEST - 1 VIEW  Comparison: 09/04/2012 at 6:13 a.m.  Findings: Right IJ introducer sheath:  Upper SVC.  Bilateral  chest tubes noted.  Stable appearance of the prosthetic valves and CABG markers. No pneumothorax observed.  Left basilar atelectasis.  A new left-sided PICC line noted with tip at the cavoatrial junction.  Cardiomegaly noted with mildly improved edema.  IMPRESSION:  1.  PICC line tip:  Cavoatrial junction. 2.  Cardiomegaly with mildly improved edema.   Original Report Authenticated By: Gaylyn Rong, M.D.     Assessment/Plan: S/P Procedure(s) (LRB): REDO AORTIC VALVE REPLACEMENT (AVR) (N/A) TRICUSPID VALVE REPAIR (N/A) INTRAOPERATIVE TRANSESOPHAGEAL ECHOCARDIOGRAM (N/A) Thrombocytopenia persists but improving , but no bleeding, avoid heparin D/c  chest tubes left chest tube today, only 50 ml out, more from the left  May need PPacer, rate in 30's CR decreased to 1.5 Replace k for hypokalemia  Gerome Kokesh B 09/06/2012 8:31 AM

## 2012-09-06 NOTE — Progress Notes (Signed)
Patient ID: Ethan Lawrence, male   DOB: 12/30/1922, 79 y.o.   MRN: [id number] EVENING ROUNDS NOTE :     [address]       [place] [address]             [phone]                 5 Days Post-Op Procedure(s) (LRB): REDO AORTIC VALVE REPLACEMENT (AVR) (N/A) TRICUSPID VALVE REPAIR (N/A) INTRAOPERATIVE TRANSESOPHAGEAL ECHOCARDIOGRAM (N/A)  Total Length of Stay:  LOS: 5 days  BP 96/50  Pulse 87  Temp(Src) 99.2 F (37.3 C) (Oral)  Resp 15  Ht 5\' 9"  (1.753 m)  Wt 165 lb 9.1 oz (75.1 kg)  BMI 24.44 kg/m2  SpO2 97%  .Intake/Output     06/28 0701 - 06/29 0700 06/29 0701 - 06/30 0700   P.O. 960 480   I.V. (mL/kg) 491.5 (6.5) 200 (2.7)   Total Intake(mL/kg) 1451.5 (19.3) 680 (9.1)   Urine (mL/kg/hr) 1875 (1) 1300 (1.4)   Chest Tube 450 (0.2) 130 (0.1)   Total Output 2325 1430   Net -873.5 -750        Urine Occurrence 1 x 1 x   Stool Occurrence  1 x     . sodium chloride 20 mL/hr at 09/04/12 1400  . sodium chloride 20 mL/hr (09/02/12 0800)  . sodium chloride 20 mL/hr at 09/03/12 0400  . nitroGLYCERIN Stopped (09/03/12 1400)     Lab Results  Component Value Date   WBC 7.5 09/06/2012   HGB 8.1* 09/06/2012   HCT 25.3* 09/06/2012   PLT 86* 09/06/2012   GLUCOSE 103* 09/06/2012   ALT 11 09/05/2012   AST 15 09/05/2012   NA 132* 09/06/2012   K 3.4* 09/06/2012   CL 96 09/06/2012   CREATININE 1.59* 09/06/2012   BUN 41* 09/06/2012   CO2 27 09/06/2012   INR 1.26 09/03/2012   HGBA1C 5.7* 08/27/2012   Stable day Rt hand with palpable redial pulse, stronger today but weaker grip the left  Delight Ovens MD  Beeper [phone] Office [phone] 09/06/2012 6:58 PM

## 2012-09-07 ENCOUNTER — Inpatient Hospital Stay (HOSPITAL_COMMUNITY): Payer: Medicare PPO

## 2012-09-07 ENCOUNTER — Encounter (HOSPITAL_COMMUNITY): Payer: Self-pay | Admitting: Nurse Practitioner

## 2012-09-07 ENCOUNTER — Encounter (HOSPITAL_COMMUNITY)
Admission: RE | Disposition: A | Discharge status: Home Health | Payer: Self-pay | Source: Ambulatory Visit | Attending: Cardiothoracic Surgery

## 2012-09-07 DIAGNOSIS — I442 Atrioventricular block, complete: Secondary | ICD-10-CM

## 2012-09-07 DIAGNOSIS — I498 Other specified cardiac arrhythmias: Secondary | ICD-10-CM

## 2012-09-07 DIAGNOSIS — I359 Nonrheumatic aortic valve disorder, unspecified: Principal | ICD-10-CM

## 2012-09-07 HISTORY — PX: PERMANENT PACEMAKER INSERTION: SHX5480

## 2012-09-07 HISTORY — PX: PACEMAKER INSERTION: SHX728

## 2012-09-07 LAB — GLUCOSE, CAPILLARY
Glucose-Capillary: 95 mg/dL (ref 70–99)
Glucose-Capillary: 79 mg/dL (ref 70–99)
Glucose-Capillary: 111 mg/dL — ABNORMAL HIGH (ref 70–99)
Glucose-Capillary: 225 mg/dL — ABNORMAL HIGH (ref 70–99)

## 2012-09-07 LAB — HEPARIN INDUCED THROMBOCYTOPENIA PNL
Heparin Induced Plt Ab: NEGATIVE
Patient O.D.: 0.099
UFH High Dose UFH H: 0 % Release
UFH Low Dose 0.1 IU/mL: 0 % Release
UFH Low Dose 0.5 IU/mL: 0 % Release
UFH SRA Result: NEGATIVE

## 2012-09-07 LAB — PROTIME-INR
INR: 1.01 (ref 0.00–1.49)
Prothrombin Time: 13.1 seconds (ref 11.6–15.2)

## 2012-09-07 LAB — BASIC METABOLIC PANEL
BUN: 38 mg/dL — ABNORMAL HIGH (ref 6–23)
CO2: 28 mEq/L (ref 19–32)
Calcium: 8.5 mg/dL (ref 8.4–10.5)
Chloride: 95 mEq/L — ABNORMAL LOW (ref 96–112)
Creatinine, Ser: 1.64 mg/dL — ABNORMAL HIGH (ref 0.50–1.35)
GFR calc Af Amer: 41 mL/min — ABNORMAL LOW (ref >90)
GFR calc non Af Amer: 36 mL/min — ABNORMAL LOW (ref >90)
Glucose, Bld: 99 mg/dL (ref 70–99)
Potassium: 4 mEq/L (ref 3.5–5.1)
Sodium: 132 mEq/L — ABNORMAL LOW (ref 135–145)

## 2012-09-07 LAB — CBC
HCT: 26.5 % — ABNORMAL LOW (ref 39.0–52.0)
Hemoglobin: 8.5 g/dL — ABNORMAL LOW (ref 13.0–17.0)
Hemoglobin: 8.5 g/dL — ABNORMAL LOW (ref 13.0–17.0)
MCH: 26.6 pg (ref 26.0–34.0)
MCHC: 32.1 g/dL (ref 30.0–36.0)
MCHC: 31.5 g/dL (ref 30.0–36.0)
MCV: 82.8 fL (ref 78.0–100.0)
Platelets: 112 10*3/uL — ABNORMAL LOW (ref 150–400)
RBC: 3.2 MIL/uL — ABNORMAL LOW (ref 4.22–5.81)
RDW: 19.1 % — ABNORMAL HIGH (ref 11.5–15.5)
RDW: 19.1 % — ABNORMAL HIGH (ref 11.5–15.5)
WBC: 8.3 10*3/uL (ref 4.0–10.5)
WBC: 8.8 10*3/uL (ref 4.0–10.5)

## 2012-09-07 LAB — CREATININE, SERUM
Creatinine, Ser: 1.52 mg/dL — ABNORMAL HIGH (ref 0.50–1.35)
GFR calc non Af Amer: 39 mL/min — ABNORMAL LOW (ref >90)

## 2012-09-07 SURGERY — PERMANENT PACEMAKER INSERTION
Anesthesia: LOCAL

## 2012-09-07 MED ORDER — ALBUMIN HUMAN 25 % IV SOLN: 12.5000 g | Freq: Once | INTRAVENOUS | Status: DC

## 2012-09-07 MED ORDER — HEPARIN (PORCINE) IN NACL 2-0.9 UNIT/ML-% IJ SOLN
INTRAMUSCULAR | Status: AC
  Filled 2012-09-07: qty 500

## 2012-09-07 MED ORDER — MIDAZOLAM HCL 5 MG/5ML IJ SOLN
INTRAMUSCULAR | Status: AC
  Filled 2012-09-07: qty 5

## 2012-09-07 MED ORDER — ACETAMINOPHEN 325 MG PO TABS: 325.0000 mg | ORAL_TABLET | ORAL | Status: DC | PRN

## 2012-09-07 MED ORDER — SODIUM CHLORIDE 0.9 % IV SOLN
INTRAVENOUS | Status: DC
  Administered 2012-09-07: 15:00:00 via INTRAVENOUS

## 2012-09-07 MED ORDER — ENOXAPARIN SODIUM 30 MG/0.3ML ~~LOC~~ SOLN
30.0000 mg | SUBCUTANEOUS | Status: AC
  Administered 2012-09-08 – 2012-09-10 (×3): 30 mg via SUBCUTANEOUS
  Filled 2012-09-07 (×5): qty 0.3

## 2012-09-07 MED ORDER — CEFAZOLIN SODIUM-DEXTROSE 2-3 GM-% IV SOLR
2.0000 g | INTRAVENOUS | Status: DC
  Filled 2012-09-07: qty 50

## 2012-09-07 MED ORDER — BUPIVACAINE HCL (PF) 0.25 % IJ SOLN
INTRAMUSCULAR | Status: AC
  Filled 2012-09-07: qty 60

## 2012-09-07 MED ORDER — ALBUMIN HUMAN 5 % IV SOLN
INTRAVENOUS | Status: AC
  Filled 2012-09-07: qty 250

## 2012-09-07 MED ORDER — ALBUMIN HUMAN 5 % IV SOLN
12.5000 g | Freq: Once | INTRAVENOUS | Status: AC
  Administered 2012-09-07: 12.5 g via INTRAVENOUS

## 2012-09-07 MED ORDER — FENTANYL CITRATE 0.05 MG/ML IJ SOLN
INTRAMUSCULAR | Status: AC
  Filled 2012-09-07: qty 2

## 2012-09-07 MED ORDER — SODIUM CHLORIDE 0.9 % IR SOLN
80.0000 mg | Status: DC
  Filled 2012-09-07: qty 2

## 2012-09-07 MED ORDER — CEFAZOLIN SODIUM-DEXTROSE 2-3 GM-% IV SOLR
2.0000 g | Freq: Four times a day (QID) | INTRAVENOUS | Status: AC
  Administered 2012-09-07 – 2012-09-08 (×2): 2 g via INTRAVENOUS
  Filled 2012-09-07 (×2): qty 50

## 2012-09-07 MED ORDER — CEFAZOLIN SODIUM-DEXTROSE 2-3 GM-% IV SOLR
2.0000 g | Freq: Four times a day (QID) | INTRAVENOUS | Status: DC
  Filled 2012-09-07 (×3): qty 50

## 2012-09-07 MED ORDER — ONDANSETRON HCL 4 MG/2ML IJ SOLN: 4.0000 mg | Freq: Four times a day (QID) | INTRAMUSCULAR | Status: DC | PRN

## 2012-09-07 NOTE — Progress Notes (Signed)
09/07/12 1600  PT Visit Information  Last PT Received On 09/07/12  Assistance Needed +2 (lines and safety)  History of Present Illness Pt admitted for redo AVR, tricuspid valve repair with post-op VDRF and confusion. Pt still works daily and cares for wife who has dementia  PT Time Calculation  PT Start Time 1051  PT Stop Time 1118  PT Time Calculation (min) 27 min  Subjective Data  Subjective "I will walk with you girls if you sign my pillow."  Precautions  Precautions Sternal;Fall  Restrictions  Weight Bearing Restrictions No  Cognition  Arousal/Alertness Awake/alert  Behavior During Therapy WFL for tasks assessed/performed  Overall Cognitive Status Impaired/Different from baseline  Area of Impairment Orientation;Safety/judgement  Orientation Level Time  Memory Decreased recall of precautions  Safety/Judgement Decreased awareness of safety  Bed Mobility  Bed Mobility Rolling Right;Right Sidelying to Sit  Rolling Right 3: Mod assist  Right Sidelying to Sit 1: +2 Total assist  Right Sidelying to Sit: Patient Percentage 50%  Details for Bed Mobility Assistance incr time and cues needed to use appropriate technique  Transfers  Transfers Sit to Stand;Stand to Sit  Sit to Stand From bed;1: +2 Total assist  Sit to Stand: Patient Percentage 60%  Stand to Sit 3: Mod assist;Without upper extremity assist;To chair/3-in-1  Details for Transfer Assistance cueing for hand placement on thighs and anterior weight shift with assist to complete  Ambulation/Gait  Ambulation/Gait Assistance 1: +2 Total assist  Ambulation/Gait: Patient Percentage 80%  Ambulation Distance (Feet) 150 Feet  Assistive device Rolling walker  Ambulation/Gait Assistance Details Needed cueing to stay close to RW, for upright posture and assist for safety.  Gait Pattern Step-through pattern;Decreased stride length  Gait velocity decreased  Stairs No  Wheelchair Mobility  Wheelchair Mobility No  PT - End of  Session  Equipment Utilized During Treatment Gait belt  Activity Tolerance Patient tolerated treatment well  Patient left in chair;with call bell/phone within reach  Nurse Communication Mobility status  PT - Assessment/Plan  PT Plan Current plan remains appropriate  PT Frequency Min 3X/week  Follow Up Recommendations Home health PT;Supervision/Assistance - 24 hour  PT equipment None recommended by PT  PT Goal Progression  Progress towards PT goals Progressing toward goals  PT General Charges  $$ ACUTE PT VISIT 1 Procedure  PT Treatments  $Gait Training 8-22 mins  $Therapeutic Exercise 8-22 mins  [facility] [phone] (pager)

## 2012-09-07 NOTE — CV Procedure (Signed)
EP Procedure Note  Procedure: VVI PM insertion via the left cephalic vein  Indication: CHB in the setting of chronic atrial fib after Redo AVR  Findings: After informed consent was obtained, the patient was taken to the EP lab in the fasting state. After the usual preparation and draping, Intravenous fentanyl and versed were given for sedation. 30 cc of lidocaine was infiltrated into the left infraclavicular region.  A 5 cm incision was carried out and electrocautery was utilized to dissect down to the delto-pectoral groove and the cephalic vein isolated and cannulated. The Medtronic 5076 active fixation bipolar pacing lead (serial # [id number]) was connected advanced into the right ventricle under flouroscopic guiadance. At the final site the R waves were 10 mV and the impedence was 659 ohms. The threshold was 1.0 volt at 0.5 ms. 10 volt pacing did not stimulate the diaphragm. A large injury current was present. The pocket was irrigated with anti-biotic irrigation and the lead was secured to the fascia with a figure of 8 silk suture and the sewing sleeve was secured with silk suture. The Medtronic Sensia dual chamber pacemaker with the atrial port capped was connected to the ventricular lead and placed back in the sub-cutaneous pocket. The generator was secured with silk suture. The skin was closed with 2-0 and 3-0 vicryl suture. Benzoin and steri-strips were painted on the skin and pressure held and the patient returned to his room in satisfactory condition.  Complications: none immediately  Conclusion: Successful insertion of a Medtronic PPM in a patient with chronic atrial fib and CHB after AVR.   Leonia Reeves.D.

## 2012-09-07 NOTE — Consult Note (Signed)
CARDIOLOGY CONSULT NOTE  Patient ID: Ethan Lawrence MRN: [id number], DOB/AGE: 1922/08/01   Admit date: 09/01/2012 Date of Consult: 09/07/2012  Primary Physician: Mikey Bussing, MD Primary Cardiologist: Mendel Ryder, MD  Pt. Profile  79 y/o male with history of coronary artery disease status post coronary artery bypass grafting as well as chronic atrial fibrillation on chronic Coumadin anticoagulation, who is status post aortic valve replacement (pericardial tissue valve), and has been pacemaker dependent.   Problem List  Past Medical History  Diagnosis Date  . Hypertension   . Hyperlipidemia   . CAD (coronary artery disease)     a. 05/2004 CABGx4 (LIMA->LAD, VG->RI, VG->OM, VG->RCA);  b. 08/2012 Cath: Native 3VD with 3/4 patent grafts (VG->RI occluded).  . Atrial fibrillation   . Complication of anesthesia     Prefers depravant  . Macular degeneration   . Aortic stenosis     a. 08/2012 TEE: EF 45-55%, critical AS, Triv AI, mild to mod MR, Sev TR;  b. 08/2012 AVR (21mm Magna Ease pericardial tissue valve) & TV Repair (30mm MC3 annuloplasty ring).  Marland Kitchen AAA (abdominal aortic aneurysm)     a. 06/2010 s/p Endovascular AAA Repair    Past Surgical History  Procedure Laterality Date  . Endovascular stent insertion  07/05/2010  . Appendectomy    . Coronary artery bypass graft    . Vascular surgery    . Back surgery    . Cardiac catheterization    . Esophagogastroduodenoscopy  06/19/2011    Procedure: ESOPHAGOGASTRODUODENOSCOPY (EGD);  Surgeon: Graylin Shiver, MD;  Location: [facility] OR;  Service: Gastroenterology;  Laterality: N/A;  . Colonoscopy  06/19/2011    Procedure: COLONOSCOPY;  Surgeon: Graylin Shiver, MD;  Location: [facility] OR;  Service: Gastroenterology;  Laterality: N/A;  . Eye surgery      bilater cataracts removed  . Aortic valve replacement N/A 09/01/2012    Procedure: REDO AORTIC VALVE REPLACEMENT (AVR);  Surgeon: Kerin Perna, MD;  Location: [facility] OR;  Service: Open Heart  Surgery;  Laterality: N/A;  . Tricuspid valve replacement N/A 09/01/2012    Procedure: TRICUSPID VALVE REPAIR;  Surgeon: Kerin Perna, MD;  Location[facility] OR;  Service: Open Heart Surgery;  Laterality: N/A;  . Intraoperative transesophageal echocardiogram N/A 09/01/2012    Procedure: INTRAOPERATIVE TRANSESOPHAGEAL ECHOCARDIOGRAM;  Surgeon: Kerin Perna, MD;  Location: [facility] OR;  Service: Open Heart Surgery;  Laterality: N/A;    Allergies  No Known Allergies  HPI   79 year old male with prior history of coronary artery disease who is status post coronary artery bypass grafting x4 in March of 2006. Unfortunately, he developed progressive dyspnea and heart failure symptoms in the setting of progressive aortic stenosis and recently underwent repeat catheterization on June 10 revealing 3 of 4 patent grafts. Outpatient echo showed critical aortic stenosis. He was referred to thoracic surgery and subsequently underwent successful aortic valve replacement with a pericardial tissue valve as well as a tricuspid valve repair on June 24. Postoperative course has been relatively uncomplicated but notable for pulmonary edema requiring diuresis, stable thrombocytopenia, confusion and persistent pacemaker dependence with rates in the 40s when the outputs on his temporary pacer are dropped. We've been asked to evaluate for consideration of permanent pacemaker placement.  Inpatient Medications  . aspirin EC  325 mg Oral Daily  . bisacodyl  10 mg Oral Daily   Or  . bisacodyl  10 mg Rectal Daily  . docusate sodium  200 mg Oral Daily  .  enoxaparin  30 mg Subcutaneous Q24H  . gabapentin  300 mg Oral BID  . insulin aspart  0-24 Units Subcutaneous Q4H  . mupirocin ointment  1 application Nasal BID  . pantoprazole  40 mg Oral Daily  . rOPINIRole  2 mg Oral QHS  . simvastatin  20 mg Oral q1800  . sodium chloride  3 mL Intravenous Q12H  . thiamine  100 mg Oral Daily   Family History Family History  Problem  Relation Age of Onset  . Coronary artery disease Brother     Social History History   Social History  . Marital Status: Married    Spouse Name: N/A    Number of Children: N/A  . Years of Education: N/A   Occupational History  . Not on file.   Social History Main Topics  . Smoking status: Former Smoker    Quit date: 10/29/1975  . Smokeless tobacco: Never Used  . Alcohol Use: No  . Drug Use: No  . Sexually Active: Not on file   Other Topics Concern  . Not on file   Social History Narrative   Pt lives in [place] with his wife.  He receives most of his medical care in [place] and stays with his dtr here when necessary.    Review of Systems  General:  No chills, fever, night sweats or weight changes.  Cardiovascular:  He has had some chest wall pain with deep breathing.  No dyspnea on exertion, edema, orthopnea, palpitations, paroxysmal nocturnal dyspnea. Dermatological: No rash, lesions/masses Respiratory: No cough, dyspnea Urologic: No hematuria, dysuria Abdominal:   No nausea, vomiting, diarrhea, bright red blood per rectum, melena, or hematemesis Neurologic:  No visual changes, wkns, changes in mental status. All other systems reviewed and are otherwise negative except as noted above.  Physical Exam  Blood pressure 136/65, pulse 88, temperature 100.7 F (38.2 C), temperature source Oral, resp. rate 17, height 5\' 9"  (1.753 m), weight 164 lb 14.5 oz (74.8 kg), SpO2 98.00%.  General: Pleasant, NAD Psych: Normal affect. Neuro: Alert and oriented X 3. Moves all extremities spontaneously. HEENT: Normal  Neck: Supple without bruits or JVD. Lungs:  Resp regular and unlabored, bibasilar crackles. Heart: RRR no s3, s4, 2/6 sem rusb, 3/6 sm llsb radiating toward apex. Abdomen: Soft, non-tender, non-distended, BS + x 4.  Extremities: No clubbing, cyanosis.  Trace R>L LE edema. DP/PT/Radials 2+ and equal bilaterally.  Labs  Lab Results  Component Value Date   WBC 8.3 09/07/2012    HGB 8.5* 09/07/2012   HCT 26.5* 09/07/2012   MCV 82.8 09/07/2012   PLT 112* 09/07/2012    Recent Labs Lab 09/05/12 0355  09/07/12 0330  NA 136  < > 132*  K 3.4*  < > 4.0  CL 100  < > 95*  CO2 27  < > 28  BUN 38*  < > 38*  CREATININE 1.72*  < > 1.64*  CALCIUM 8.2*  < > 8.5  PROT 5.3*  --   --   BILITOT 0.7  --   --   ALKPHOS 74  --   --   ALT 11  --   --   AST 15  --   --   GLUCOSE 106*  < > 99  < > = values in this interval not displayed.  Radiology/Studies  Dg Chest Port 1 View  09/07/2012   *RADIOLOGY REPORT*  Clinical Data: Postop and check chest tube.  PORTABLE CHEST - 1 VIEW  Comparison: 09/06/2012  Findings: Stable appearance of the right chest tube. Question a small right apical pneumothorax.  The left chest tube has been removed.  There may be a tiny left apical pneumothorax.  Heart size remains enlarged.  PICC line tip in the SVC region.  Focal density in the left mid chest is probably related to the prior chest tube and volume loss.  Again noted are subtle interstitial densities in the left lower lung.  IMPRESSION: Question tiny bilateral apical pneumothoraces.  Removal of the left chest tube with focal atelectasis or consolidation in the left chest as described.   Original Report Authenticated By: Richarda Overlie, M.D.   ECG  jxnl rhythm, 41, rbbb, subsequent v pacing  ASSESSMENT AND PLAN  1.  Persistent Bradycardia:  Pt is s/p AVR/TVR and has been pacer dependent since surgery on the 30th.  When his pacer MA is turned down to zero in both the A and V, rates drop into the 40's in what looks like afib (chronic).  He is not on any av nodal blocking agents and electrolytes are within normal limits.  He will require permanent pacemaker placement. He is undersensing on his atrial lead and pacing in atrial fib on his temporary TV PM.   2. S/P AVR/TVR:  Per TCTS.  3.  ? Apical pneumothoraces:  Per TCTS.  O2 therapy.  S/p chest tube removal.  4.  Afib:  Chronic.  Resume coumadin  prior to d/c.  Signed, Nicolasa Ducking, NP 09/07/2012, 12:04 PM  EP Attending  Patient seen and examined. He has CHB with a ventricular escape in the 40's after redo AVR and TV ring. No reversible causes.  I have discussed with Dr. PVT. Will plan to proceed with PPM this p.m. I have discussed the risks/benefits/goals/expectations of PPM with the patient and his grandson and he wishes to proceed.  Leonia Reeves.D.

## 2012-09-07 NOTE — Progress Notes (Signed)
[facility] BRIEF SICU PROGRESS NOTE  Day of Surgery  S/P Procedure(s) (LRB): PERMANENT PACEMAKER INSERTION (N/A)   Doing well following pacer placement earlier today Paced rhythm w/ stable BP O2 sats 97% on RA  Plan: Continue current plan  OWEN,CLARENCE H 09/07/2012 7:10 PM

## 2012-09-07 NOTE — Progress Notes (Signed)
Still pacer dependent now 6 days post AVR. Needs DDD pacer.

## 2012-09-08 ENCOUNTER — Inpatient Hospital Stay (HOSPITAL_COMMUNITY): Payer: Medicare PPO

## 2012-09-08 ENCOUNTER — Encounter (HOSPITAL_COMMUNITY): Payer: Self-pay | Admitting: *Deleted

## 2012-09-08 LAB — GLUCOSE, CAPILLARY
Glucose-Capillary: 105 mg/dL — ABNORMAL HIGH (ref 70–99)
Glucose-Capillary: 120 mg/dL — ABNORMAL HIGH (ref 70–99)
Glucose-Capillary: 156 mg/dL — ABNORMAL HIGH (ref 70–99)
Glucose-Capillary: 188 mg/dL — ABNORMAL HIGH (ref 70–99)
Glucose-Capillary: 35 mg/dL — CL (ref 70–99)
Glucose-Capillary: 78 mg/dL (ref 70–99)
Glucose-Capillary: 91 mg/dL (ref 70–99)

## 2012-09-08 LAB — BASIC METABOLIC PANEL
BUN: 35 mg/dL — ABNORMAL HIGH (ref 6–23)
Calcium: 8.4 mg/dL (ref 8.4–10.5)
GFR calc Af Amer: 46 mL/min — ABNORMAL LOW (ref >90)
GFR calc non Af Amer: 39 mL/min — ABNORMAL LOW (ref >90)
Potassium: 3.8 mEq/L (ref 3.5–5.1)

## 2012-09-08 LAB — CBC
HCT: 24.6 % — ABNORMAL LOW (ref 39.0–52.0)
MCHC: 32.1 g/dL (ref 30.0–36.0)
RDW: 19.2 % — ABNORMAL HIGH (ref 11.5–15.5)

## 2012-09-08 MED ORDER — DEXTROSE 50 % IV SOLN
INTRAVENOUS | Status: AC
  Administered 2012-09-08: 50 mL via INTRAVENOUS
  Filled 2012-09-08: qty 50

## 2012-09-08 MED ORDER — EPINEPHRINE HCL 1 MG/ML IJ SOLN
0.2500 ug/min | INTRAVENOUS | Status: DC
  Filled 2012-09-08: qty 1

## 2012-09-08 MED ORDER — WARFARIN - PHYSICIAN DOSING INPATIENT
Freq: Every day | Status: DC
  Administered 2012-09-08 – 2012-09-09 (×2)

## 2012-09-08 MED ORDER — PREDNISONE 5 MG PO TABS
5.0000 mg | ORAL_TABLET | Freq: Every day | ORAL | Status: DC
  Administered 2012-09-08 – 2012-09-12 (×5): 5 mg via ORAL
  Filled 2012-09-08 (×12): qty 1

## 2012-09-08 MED ORDER — DEXTROSE 50 % IV SOLN: 50.0000 mL | Freq: Once | INTRAVENOUS | Status: AC | PRN

## 2012-09-08 MED ORDER — FE FUMARATE-B12-VIT C-FA-IFC PO CAPS
1.0000 | ORAL_CAPSULE | Freq: Three times a day (TID) | ORAL | Status: DC
  Administered 2012-09-08 – 2012-09-12 (×15): 1 via ORAL
  Filled 2012-09-08 (×22): qty 1

## 2012-09-08 MED ORDER — WARFARIN SODIUM 2.5 MG PO TABS
2.5000 mg | ORAL_TABLET | Freq: Every day | ORAL | Status: DC
  Administered 2012-09-08 – 2012-09-10 (×3): 2.5 mg via ORAL
  Filled 2012-09-08 (×5): qty 1

## 2012-09-08 NOTE — Progress Notes (Signed)
   ELECTROPHYSIOLOGY ROUNDING NOTE    Patient Name: JAVAUGHN OPDAHL Date of Encounter: 09/08/2012    SUBJECTIVE: Feels well.  No chest pain or shortness of breath.  Minimal incisional soreness.  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 albumin human      . aspirin EC  325 mg Oral Daily  . bisacodyl  10 mg Oral Daily   Or  . bisacodyl  10 mg Rectal Daily  . docusate sodium  200 mg Oral Daily  . enoxaparin  30 mg Subcutaneous Q24H  . gabapentin  300 mg Oral BID  . insulin aspart  0-24 Units Subcutaneous Q4H  . pantoprazole  40 mg Oral Daily  . rOPINIRole  2 mg Oral QHS  . simvastatin  20 mg Oral q1800  . sodium chloride  3 mL Intravenous Q12H  . thiamine  100 mg Oral Daily    LABS: Basic Metabolic Panel:  Recent Labs  16/10/96 0330 09/07/12 1851 09/08/12 0352  NA 132*  --  133*  K 4.0  --  3.8  CL 95*  --  97  CO2 28  --  28  GLUCOSE 99  --  121*  BUN 38*  --  35*  CREATININE 1.64* 1.52* 1.51*  CALCIUM 8.5  --  8.4   CBC:  Recent Labs  09/07/12 1851 09/08/12 0352  WBC 8.8 10.4  HGB 8.5* 7.9*  HCT 27.0* 24.6*  MCV 83.9 83.4  PLT 136* 127*    Radiology/Studies:  Final result pending, lead in stable position.  PHYSICAL EXAM Left chest without hematoma or ecchymosis  DEVICE INTERROGATION: Device interrogated and found to be functioning normally.   Routine device follow up scheduled.  Wound care, arm mobility,  restrictions reviewed with patient.   A/P 1. CHB after AVR 2. S/p PPM  Rec: continue routine post-op care. Usual followup. Ok to add AV nodal blocking drugs as needed if rate increases.  Leonia Reeves.D.

## 2012-09-08 NOTE — Progress Notes (Signed)
1 Day Post-Op Procedure(s) (LRB): PERMANENT PACEMAKER INSERTION (N/A) Subjective  Feels weak Tolerated perm pacemaker last pm Now in afib rate 90/min Will resume low dose coumadin Postop anemia- start po iron  Objective: Vital signs in last 24 hours: Temp:  [98.2 F (36.8 C)-100.7 F (38.2 C)] 98.2 F (36.8 C) (07/01 0716) Pulse Rate:  [68-105] 96 (07/01 0700) Cardiac Rhythm:  [-] Ventricular paced (06/30 2000) Resp:  [15-34] 20 (07/01 0700) BP: (75-158)/(37-108) 123/108 mmHg (07/01 0700) SpO2:  [32 %-100 %] 96 % (07/01 0700) Weight:  [164 lb 0.4 oz (74.4 kg)] 164 lb 0.4 oz (74.4 kg) (07/01 0500)  Hemodynamic parameters for last 24 hours:  stable  Intake/Output from previous day: 06/30 0701 - 07/01 0700 In: 490 [P.O.:120; I.V.:70; IV Piggyback:300] Out: 1385 [Urine:1325; Chest Tube:60] Intake/Output this shift:    Alert Lungs clear R hand grip weak from R axillary exposure Incisions clean  Lab Results:  Recent Labs  09/07/12 1851 09/08/12 0352  WBC 8.8 10.4  HGB 8.5* 7.9*  HCT 27.0* 24.6*  PLT 136* 127*   BMET:  Recent Labs  09/07/12 0330 09/07/12 1851 09/08/12 0352  NA 132*  --  133*  K 4.0  --  3.8  CL 95*  --  97  CO2 28  --  28  GLUCOSE 99  --  121*  BUN 38*  --  35*  CREATININE 1.64* 1.52* 1.51*  CALCIUM 8.5  --  8.4    PT/INR:  Recent Labs  09/07/12 0330  LABPROT 13.1  INR 1.01   ABG    Component Value Date/Time   PHART 7.424 09/03/2012 0420   HCO3 21.6 09/03/2012 0420   TCO2 23 09/03/2012 0420   ACIDBASEDEF 2.0 09/03/2012 0420   O2SAT 92.0 09/03/2012 0420   CBG (last 3)   Recent Labs  09/08/12 0016 09/08/12 0031 09/08/12 0715  GLUCAP 35* 156* 105*    Assessment/Plan: S/P Procedure(s) (LRB): PERMANENT PACEMAKER INSERTION (N/A) Plan for transfer to step-down: see transfer orders Coumadin 2.5 daily  Resume low dose prednisone preop dose  LOS: 7 days    VAN TRIGT III,PETER 09/08/2012

## 2012-09-08 NOTE — Plan of Care (Signed)
Problem: Phase III Progression Outcomes Goal: Time patient transferred to PCTU/Telemetry POD Outcome: Completed/Met Date Met:  09/08/12 1025am-patient has been transferred safely to 2032, traveled via wheelchair. VS stable prior and during the transfer. [name] belongings (eye-glasses and top dentures) are currently worn by patient, no other personal belongings at bedside of 2312 that could be taken to his new room. Report given to receiving day-shift RN. Patient settled in bed in 2032, daughter at bedside, receiving staff in room. Beauty Pless, Charity fundraiser.

## 2012-09-08 NOTE — Progress Notes (Signed)
Physical Therapy Treatment Patient Details Name: Ethan Lawrence MRN: [id number] DOB: 1922/08/31 Today's Date: 09/08/2012 Time: 4098-1191 PT Time Calculation (min): 24 min  PT Assessment / Plan / Recommendation  PT Comments   Pt with limited ambulation distance today due to fatigue.  Performed a few exercises in bed as well.  Pt unable to recall all sternal precautions so reviewed and demonstrated for pt.   Follow Up Recommendations  Home health PT;Supervision/Assistance - 24 hour     Does the patient have the potential to tolerate intense rehabilitation     Barriers to Discharge        Equipment Recommendations  None recommended by PT    Recommendations for Other Services    Frequency     Progress towards PT Goals Progress towards PT goals: Progressing toward goals  Plan Current plan remains appropriate    Precautions / Restrictions Precautions Precautions: Sternal;Fall   Pertinent Vitals/Pain Only c/o L knee pain with exercises so performed to tolerance    Mobility  Bed Mobility Bed Mobility: Rolling Right;Right Sidelying to Sit;Sit to Sidelying Right Rolling Right: 5: Supervision Right Sidelying to Sit: 4: Min assist Sit to Sidelying Right: 4: Min guard Details for Bed Mobility Assistance: incr time and cues needed to use appropriate technique Transfers Transfers: Sit to Stand;Stand to Sit Sit to Stand: 3: Mod assist;From bed Stand to Sit: Without upper extremity assist;4: Min assist;To bed Details for Transfer Assistance: verbal cues for hand placement, assist to rise and control descent Ambulation/Gait Ambulation/Gait Assistance: 4: Min assist Ambulation Distance (Feet): 40 Feet Assistive device: Rolling walker Ambulation/Gait Assistance Details: verbal cues for safe RW distance, upright posture, assist to steady, pt reports increased fatigue today limiting distance Gait Pattern: Step-through pattern;Decreased stride length Gait velocity: decreased Stairs:  No Wheelchair Mobility Wheelchair Mobility: No    Exercises General Exercises - Lower Extremity Ankle Circles/Pumps: AROM;Both;20 reps Quad Sets: AROM;Both;20 reps Long Arc Quad: AROM;Both;10 reps;Seated Heel Slides: AROM;Right;20 reps;Supine Hip ABduction/ADduction: AROM;Right;15 reps;Supine Straight Leg Raises: AROM;Right;10 reps;Supine   PT Diagnosis:    PT Problem List:   PT Treatment Interventions:     PT Goals (current goals can now be found in the care plan section)    Visit Information  Last PT Received On: 09/08/12 Assistance Needed: +1    Subjective Data      Cognition  Cognition Arousal/Alertness: Awake/alert Behavior During Therapy: [facility] for tasks assessed/performed Overall Cognitive Status: Impaired/Different from baseline Area of Impairment: Orientation;Safety/judgement Orientation Level: Time Memory: Decreased recall of precautions Safety/Judgement: Decreased awareness of safety    Balance     End of Session PT - End of Session Activity Tolerance: Patient limited by fatigue Patient left: in bed;with call bell/phone within reach Nurse Communication:  (nsg tech observed ambulation in hallway)   GP     Ethan Lawrence,Ethan Lawrence 09/08/2012, 4:42 PM Ethan Lawrence, PT, DPT 09/08/2012 Pager: [phone]

## 2012-09-08 NOTE — Progress Notes (Signed)
Speech Language Pathology Dysphagia Treatment Patient Details Name: Ethan Lawrence MRN: [id number] DOB: 15-Jun-1922 Today's Date: 09/08/2012 Time: 0945-1000 SLP Time Calculation (min): 15 min  Assessment / Plan / Recommendation Clinical Impression  Pt. seen for abiltiy to upgrade diet texture.  He and daughter report no difficulty with swallowing over past several days.  No cues were required while pt. masticated graham cracker and consumed thin water via straw.  No indications of aspiration of pharyngeal weakness.  Upgrade diet texture to regular, continue thin and no follow up ST  needed.    Diet Recommendation  Initiate / Change Diet: Regular;Thin liquid    SLP Plan All goals met   Pertinent Vitals/Pain Pt. denied   Swallowing Goals  SLP Swallowing Goals Patient will consume recommended diet without observed clinical signs of aspiration with: Moderate assistance Swallow Study Goal #1 - Progress: Met Patient will utilize recommended strategies during swallow to increase swallowing safety with: Moderate assistance Swallow Study Goal #2 - Progress: Met  General Temperature Spikes Noted: No Respiratory Status: Room air Behavior/Cognition: Alert;Cooperative;Pleasant mood Oral Cavity - Dentition: Dentures, top;Missing dentition Patient Positioning: Upright in bed  Oral Cavity - Oral Hygiene Does patient have any of the following "at risk" factors?: None of the above Brush patient's teeth BID with toothbrush (using toothpaste with fluoride): Yes Patient is AT RISK - Oral Care Protocol followed (see row info): Yes   Dysphagia Treatment Treatment focused on: Skilled observation of diet tolerance Treatment Methods/Modalities: Skilled observation Patient observed directly with PO's: Yes Type of PO's observed: Regular;Thin liquids Feeding: Able to feed self Liquids provided via: Straw   GO     Royce Macadamia M.Ed [employer] [phone]  09/08/2012

## 2012-09-08 NOTE — Significant Event (Signed)
0900am-EPWs removed following protocol, using sterile techniques. Tips of EPWs are intact, no blood or tissue noted on them. Pt tolerated procedure well. VS stable. Lovenox will be given two hours post removal of EPW as per ordered of Dr. Donata Clay. Patient is bedrest for one hour. Will continue to monitor. Roshni Burbano, Charity fundraiser.

## 2012-09-08 NOTE — Progress Notes (Signed)
Hypoglycemic Event  CBG: 35  Treatment: D50 IV 25 mL  Symptoms: Sweaty  Follow-up CBG: Time:0030  CBG Result:156   Possible Reasons for Event: Inadequate meal intake  Comments/MD notified:no    Ethan Lawrence, Ethan Lawrence  Remember to initiate Hypoglycemia Order Set & complete

## 2012-09-09 ENCOUNTER — Inpatient Hospital Stay (HOSPITAL_COMMUNITY): Payer: Medicare PPO

## 2012-09-09 LAB — GLUCOSE, CAPILLARY
Glucose-Capillary: 85 mg/dL (ref 70–99)
Glucose-Capillary: 136 mg/dL — ABNORMAL HIGH (ref 70–99)
Glucose-Capillary: 102 mg/dL — ABNORMAL HIGH (ref 70–99)
Glucose-Capillary: 156 mg/dL — ABNORMAL HIGH (ref 70–99)

## 2012-09-09 LAB — BASIC METABOLIC PANEL
BUN: 33 mg/dL — ABNORMAL HIGH (ref 6–23)
CO2: 27 mEq/L (ref 19–32)
Calcium: 8.5 mg/dL (ref 8.4–10.5)
Chloride: 98 mEq/L (ref 96–112)
Creatinine, Ser: 1.52 mg/dL — ABNORMAL HIGH (ref 0.50–1.35)
GFR calc Af Amer: 45 mL/min — ABNORMAL LOW (ref >90)
GFR calc non Af Amer: 39 mL/min — ABNORMAL LOW (ref >90)
Glucose, Bld: 97 mg/dL (ref 70–99)
Potassium: 3.6 mEq/L (ref 3.5–5.1)
Sodium: 134 mEq/L — ABNORMAL LOW (ref 135–145)

## 2012-09-09 LAB — CBC
HCT: 24.7 % — ABNORMAL LOW (ref 39.0–52.0)
Hemoglobin: 7.7 g/dL — ABNORMAL LOW (ref 13.0–17.0)
MCH: 26 pg (ref 26.0–34.0)
MCHC: 31.2 g/dL (ref 30.0–36.0)
MCV: 83.4 fL (ref 78.0–100.0)
Platelets: 170 10*3/uL (ref 150–400)
RBC: 2.96 MIL/uL — ABNORMAL LOW (ref 4.22–5.81)
RDW: 19.4 % — ABNORMAL HIGH (ref 11.5–15.5)
WBC: 9.1 10*3/uL (ref 4.0–10.5)

## 2012-09-09 LAB — HEMOGLOBIN AND HEMATOCRIT, BLOOD
HCT: 26.4 % — ABNORMAL LOW (ref 39.0–52.0)
Hemoglobin: 8.4 g/dL — ABNORMAL LOW (ref 13.0–17.0)

## 2012-09-09 LAB — PROTIME-INR
INR: 1.21 (ref 0.00–1.49)
Prothrombin Time: 15 seconds (ref 11.6–15.2)

## 2012-09-09 MED ORDER — FUROSEMIDE 10 MG/ML IJ SOLN
INTRAMUSCULAR | Status: AC
  Filled 2012-09-09: qty 4

## 2012-09-09 MED ORDER — FUROSEMIDE 10 MG/ML IJ SOLN
20.0000 mg | Freq: Once | INTRAMUSCULAR | Status: AC
  Administered 2012-09-09: 20 mg via INTRAVENOUS

## 2012-09-09 MED ORDER — METOPROLOL TARTRATE 12.5 MG HALF TABLET
12.5000 mg | ORAL_TABLET | Freq: Every morning | ORAL | Status: DC
  Administered 2012-09-09: 12.5 mg via ORAL
  Filled 2012-09-09 (×2): qty 1

## 2012-09-09 MED ORDER — METOPROLOL TARTRATE 12.5 MG HALF TABLET: 12.5000 mg | ORAL_TABLET | Freq: Every day | ORAL | Status: DC

## 2012-09-09 NOTE — Progress Notes (Signed)
1 unit PRBC started at this time; VSS; will cont. To monitor.

## 2012-09-09 NOTE — Progress Notes (Signed)
Physical Therapy Treatment Patient Details Name: Ethan Lawrence MRN: [id number] DOB: [birth year] Today's Date: 09/09/2012 Time: 7829-5621 PT Time Calculation (min): 26 min  PT Assessment / Plan / Recommendation  PT Comments   Pt is making steady progress towards goals at this time but still limited by fatigue during ambulation despite receiving blood.  Will continue to work with patient to improve activity tolerance and overall safety and stability with ambulation. Will see as indicated.   Follow Up Recommendations  Home health PT;Supervision/Assistance - 24 hour     Does the patient have the potential to tolerate intense rehabilitation     Barriers to Discharge        Equipment Recommendations  None recommended by PT    Recommendations for Other Services    Frequency Min 3X/week   Progress towards PT Goals Progress towards PT goals: Progressing toward goals  Plan Current plan remains appropriate    Precautions / Restrictions Precautions Precautions: Sternal;Fall Restrictions Weight Bearing Restrictions: Yes   Pertinent Vitals/Pain VSS; bp seated EOB 126/77, some mild dizziness reported with fatigue    Mobility  Bed Mobility Bed Mobility: Rolling Right;Right Sidelying to Sit;Sit to Sidelying Right Rolling Right: 5: Supervision Right Sidelying to Sit: 4: Min assist Sit to Sidelying Right: 4: Min guard Details for Bed Mobility Assistance: VCs for sternal precautions, incr time and cues needed to use appropriate technique Transfers Transfers: Sit to Stand;Stand to Sit Sit to Stand: 3: Mod assist;From bed Stand to Sit: 4: Min assist;Without upper extremity assist;To chair/3-in-1 Details for Transfer Assistance: VCs for precautions with multimodal cues for technique Ambulation/Gait Ambulation/Gait Assistance: 4: Min assist Ambulation Distance (Feet): 160 Feet Assistive device: 2 person hand held assist Ambulation/Gait Assistance Details: VCs for upright posture and  gait (one standing rest break and one extended seated rest break) Gait Pattern: Step-through pattern;Decreased stride length;Shuffle Gait velocity: decreased General Gait Details: shuffling gait; possibly secondary to footwear Stairs: No Wheelchair Mobility Wheelchair Mobility: No    Exercises General Exercises - Lower Extremity Ankle Circles/Pumps: AROM;Both;20 reps Long Arc Quad: AROM;Both;10 reps;Seated Hip ABduction/ADduction: AROM;Right;15 reps;Supine     PT Goals (current goals can now be found in the care plan section) Acute Rehab PT Goals Patient Stated Goal: get back to work PT Goal Formulation: With patient Time For Goal Achievement: 09/18/12 Potential to Achieve Goals: Good  Visit Information  Last PT Received On: 09/09/12 Assistance Needed: +1 History of Present Illness: Pt admitted for redo AVR, tricuspid valve repair with post-op VDRF and confusion. Pt still works daily and cares for wife who has dementia    Subjective Data  Subjective: I'm getting blood; Have you signed my pillow yet? Patient Stated Goal: get back to work   Cognition  Cognition Arousal/Alertness: Awake/alert Behavior During Therapy: WFL for tasks assessed/performed Overall Cognitive Status: Impaired/Different from baseline Area of Impairment: Safety/judgement Memory: Decreased recall of precautions Safety/Judgement: Decreased awareness of safety    Balance   instability noted, HHA during ambulation, will progress  End of Session PT - End of Session Equipment Utilized During Treatment: Gait belt Activity Tolerance: Patient limited by fatigue Patient left: in chair;with call bell/phone within reach;Other (comment) (receiving blood)   GP     Fabio Asa 09/09/2012, 4:07 PM Charlotte Crumb, PT DPT  [phone]

## 2012-09-09 NOTE — Progress Notes (Addendum)
      [address]       [place] [address]             [phone]      2 Days Post-Op Procedure(s) (LRB): PERMANENT PACEMAKER INSERTION (N/A)  Subjective:  Ethan Lawrence states he continues to feel weak.  He is hopeful he will be discharged soon.  However, the patient lives alone and does not have help at discharge.   Objective: Vital signs in last 24 hours: Temp:  [98.3 F (36.8 C)-99.9 F (37.7 C)] 98.7 F (37.1 C) (07/02 0324) Pulse Rate:  [87-105] 103 (07/02 0324) Cardiac Rhythm:  [-] Ventricular paced (07/01 1955) Resp:  [18-22] 18 (07/02 0324) BP: (96-168)/(61-93) 152/93 mmHg (07/02 0324) SpO2:  [93 %-100 %] 95 % (07/02 0324) Weight:  [164 lb 3.9 oz (74.5 kg)] 164 lb 3.9 oz (74.5 kg) (07/02 0324)  Intake/Output from previous day: 07/01 0701 - 07/02 0700 In: 570 [P.O.:570] Out: 771 [Urine:770; Stool:1]  General appearance: alert, cooperative and no distress Heart: irregular, paced Lungs: clear to auscultation bilaterally Abdomen: soft, non-tender; bowel sounds normal; no masses,  no organomegaly Extremities: extremities normal, atraumatic, no cyanosis or edema Wound: clean and dry, staples remain in place on sternotomy  Lab Results:  Recent Labs  09/08/12 0352 09/09/12 0628  WBC 10.4 9.1  HGB 7.9* 7.7*  HCT 24.6* 24.7*  PLT 127* 170   BMET:  Recent Labs  09/08/12 0352 09/09/12 0628  NA 133* 134*  K 3.8 3.6  CL 97 98  CO2 28 27  GLUCOSE 121* 97  BUN 35* 33*  CREATININE 1.51* 1.52*  CALCIUM 8.4 8.5    PT/INR:  Recent Labs  09/09/12 0628  LABPROT 15.0  INR 1.21   ABG    Component Value Date/Time   PHART 7.424 09/03/2012 0420   HCO3 21.6 09/03/2012 0420   TCO2 23 09/03/2012 0420   ACIDBASEDEF 2.0 09/03/2012 0420   O2SAT 92.0 09/03/2012 0420   CBG (last 3)   Recent Labs  09/08/12 2133 09/09/12 09/09/12 0319  GLUCAP 91 136* 85    Assessment/Plan: S/P Procedure(s) (LRB): PERMANENT PACEMAKER INSERTION (N/A)  1.  CV- A. Fib, rate in the low 100s, Hypertensive- pacermaker interrogated, will discuss with staff about starting Beta Blocker 2. Pulm- no acute issues 3. INR 1.21- will repeat Coumadin at 2.5 mg daily 4. Acute blood loss anemia- Hgb 7.7- on iron however with patient feeling weak, may benefit from transfusion  5. Deconditioning- PT recs H/H, however patient lives alone will need SNF if can be arranged 6. Dispo- patient is stable, continue current care, may need to start beta blocker, transfusion will talk to staff   LOS: 8 days    Lowella Dandy 09/09/2012  Start low dose lopressor Home on coumadin for a-fib Staples out in office 7-10 days post discharge

## 2012-09-09 NOTE — Progress Notes (Signed)
Hgb 7.7 today; pt to receive 1 unit PRBC; pt signed consent; pt not ambulated at this time due to weakness related to anemia; pt assisted up OOB to chair; call bell w/i reach; will cont. To monitor.

## 2012-09-10 ENCOUNTER — Inpatient Hospital Stay (HOSPITAL_COMMUNITY): Payer: Medicare PPO

## 2012-09-10 LAB — TYPE AND SCREEN: Unit division: 0

## 2012-09-10 LAB — PROTIME-INR
INR: 1.13 (ref 0.00–1.49)
Prothrombin Time: 14.3 seconds (ref 11.6–15.2)

## 2012-09-10 MED ORDER — METOPROLOL TARTRATE 25 MG PO TABS
25.0000 mg | ORAL_TABLET | Freq: Every morning | ORAL | Status: DC
  Administered 2012-09-10 – 2012-09-12 (×3): 25 mg via ORAL
  Filled 2012-09-10 (×5): qty 1

## 2012-09-10 MED ORDER — FUROSEMIDE 40 MG PO TABS
40.0000 mg | ORAL_TABLET | Freq: Every day | ORAL | Status: DC
  Administered 2012-09-10 – 2012-09-11 (×2): 40 mg via ORAL
  Filled 2012-09-10 (×2): qty 1

## 2012-09-10 NOTE — Progress Notes (Signed)
Pt ambulated 150 ft with rolling walker x1 assist. Pt took one short rest break. Tolerated well. Back to chair with call bell in reach. Dion Saucier

## 2012-09-10 NOTE — Discharge Summary (Signed)
[address]       [place] [address]             [phone]              Discharge Summary  Name: Ethan Lawrence DOB: 17-Mar-1922 79 y.o. MRN: [id number]   Admission Date: 09/01/2012 Discharge Date:     Admitting Diagnosis: Severe aortic stenosis Severe tricuspid regurgitation   Discharge Diagnosis:  Severe aortic stenosis Severe tricuspid regurgitation Expected postoperative blood loss on chronic anemia Postoperative thrombocytopenia Complete heart block  Past Medical History  Diagnosis Date  . Hypertension   . Hyperlipidemia   . CAD (coronary artery disease)     a. 05/2004 CABGx4 (LIMA->LAD, VG->RI, VG->OM, VG->RCA);  b. 08/2012 Cath: Native 3VD with 3/4 patent grafts (VG->RI occluded).  . Atrial fibrillation   . Complication of anesthesia     Prefers depravant  . Macular degeneration   . Aortic stenosis     a. 08/2012 TEE: EF 45-55%, critical AS, Triv AI, mild to mod MR, Sev TR;  b. 08/2012 AVR (21mm Magna Ease pericardial tissue valve) & TV Repair (30mm MC3 annuloplasty ring).  Marland Kitchen AAA (abdominal aortic aneurysm)     a. 06/2010 s/p Endovascular AAA Repair  . Complete heart block, post-surgical 08/2012    s/p MDT pacemaker implant by Dr Ladona Ridgel      Procedures: REDO STERNOTOMY - 09/01/2012  AORTIC VALVE REPLACEMENT (21 mm Magna Ease pericardial tissue valve)  TRICUSPID VALVE REPAIR (30 mm Edwards MC3 annuloplasty ring)  PERMANENT PACEMAKER INSERTION - 09/07/2012   HPI:  The patient is a 79 y.o. male who is status post CABG 8 years ago.  He recently presented with progressive weakness and fatigue and was evaluated by Dr. Katrinka Blazing.  Echo revealed critical aortic stenosis, with moderate to severe tricuspid regurgitation. He then underwent cardiac catheterization on 08/18/2012, which revealed patent grafts, and severe aortic stenosis with peak gradient of 70 mm Hg and valve area of 0.85.  He was referred to Dr. Donata Clay for  consideration of surgical intervention.  Dr. Donata Clay recommended proceeding with aortic valve replacement and possible tricuspid repair at this time. All risks, benefits and alternatives of surgery were explained in detail, and the patient agreed to proceed.   Hospital Course:  The patient was admitted to [facility] on 09/01/2012. The patient was taken to the operating room and underwent the above procedure.    The postoperative course was notable for persistent, pacemaker dependent bradycardia and complete heart block.  He ultimately required placement of a permanent pacemaker by Dr.Taylor on 09/07/2012.  Since that time, he has remained in rate controlled atrial fibrillation. He was started on a beta blocker and Coumadin.  Also notable in his course was a fall sustained on 09/11/2011.  He reportedly was sitting on the side of the bed and using a urinal and slipped and fell, striking the left forehead and cheek. He had a CT of the head that showed a scalp hematoma but no fracture or bleeding inside the skull. No sign of stroke. CT of the neck showed no fracture or dislocation. When he arrived back to the floor, he was noted by Rapid Response nurse to have some left-sided weakness and left facial droop with dysarthria. He was transferred back to 2300 for observation, but had no further weakness or dysarthria. It was unclear if he had a TIA. Preop carotid dopplers showed no significant disease. He could have  had a concussion with his fall. He was kept in the SICU for observation and it was felt best to stop his Coumadin.   He otherwise remained stable and was returned to stepdown on 09/12/2012.  He has had acute on chronic anemia and has required transfusions and oral iron therapy. Renal function has remained stable at baseline with good urine output.  He has been deconditioned and is working with PT and cardiac rehab on ambulation.  He is tolerating a regular diet and is having normal bowel and bladder  function.  He remained medically stable and was deemed ready for discharge on 09/13/2012.    Recent vital signs:  Filed Vitals:   09/10/12 0516  BP: 140/97  Pulse: 110  Temp: 99.2 F (37.3 C)  Resp: 18    Recent laboratory studies:  CBC: Recent Labs  09/08/12 0352 09/09/12 0628 09/09/12 1841  WBC 10.4 9.1  --   HGB 7.9* 7.7* 8.4*  HCT 24.6* 24.7* 26.4*  PLT 127* 170  --    BMET:  Recent Labs  09/08/12 0352 09/09/12 0628  NA 133* 134*  K 3.8 3.6  CL 97 98  CO2 28 27  GLUCOSE 121* 97  BUN 35* 33*  CREATININE 1.51* 1.52*  CALCIUM 8.4 8.5    PT/INR:  Recent Labs  09/10/12 0430  LABPROT 14.3  INR 1.13      Discharge Instructions:  The patient is to refrain from driving, heavy lifting or strenuous activity.  May shower daily and clean incisions with soap and water.  May resume regular diet.   Discharge Medications:    Medication List    STOP taking these medications       furosemide 20 MG tablet  Commonly known as:  LASIX     HYDROcodone-acetaminophen 10-500 MG per tablet  Commonly known as:  LORTAB     warfarin 6 MG tablet  Commonly known as:  COUMADIN      TAKE these medications       aspirin 325 MG EC tablet  Take 1 tablet (325 mg total) by mouth daily.     docusate sodium 100 MG capsule  Commonly known as:  COLACE  Take 100 mg by mouth 2 (two) times daily.     ferrous sulfate 325 (65 FE) MG EC tablet  Take 325 mg by mouth 2 (two) times daily.     folic acid 1 MG tablet  Commonly known as:  FOLVITE  Take 1 mg by mouth daily.     gabapentin 300 MG capsule  Commonly known as:  NEURONTIN  Take 300 mg by mouth 2 (two) times daily.     hydrocortisone cream 1 %  Apply 1 application topically 2 (two) times daily as needed (for leg itching).     metoprolol tartrate 25 MG tablet  Commonly known as:  LOPRESSOR  Take 1 tablet (25 mg total) by mouth 2 (two) times daily.     predniSONE 5 MG tablet  Commonly known as:  DELTASONE  Take 1  tablet (5 mg total) by mouth daily with breakfast.     PRESERVISION AREDS 2 Caps  Take 1 tablet by mouth 2 (two) times daily.     PRILOSEC 20 MG capsule  Generic drug:  omeprazole  Take 20 mg by mouth daily.     rOPINIRole 2 MG tablet  Commonly known as:  REQUIP  Take 2 mg by mouth at bedtime.     simvastatin 20 MG tablet  Commonly known as:  ZOCOR  Take 20 mg by mouth at bedtime.     thiamine 100 MG tablet  Take 1 tablet (100 mg total) by mouth daily.     traMADol 50 MG tablet  Commonly known as:  ULTRAM  Take 1 tablet (50 mg total) by mouth every 6 (six) hours as needed.         Follow Up Appointments:  Follow-up Information   Follow up with [facility] On 09/16/2012. (At 2:30 PM for wound check)    Contact information:   [address] [place] [address] [phone]      Follow up with Lesleigh Noe, MD. Schedule an appointment as soon as possible for a visit in 2 weeks.   Contact information:   [address] [place] [address] [phone]       Follow up with VAN Dinah Beers, MD On 10/07/2012. (Have a chest x-ray at 11:00, then see MD at 12:00)    Contact information:   [address] [employer] [address] [place] [address] [phone]       Follow up with [facility]. (office will contact you re; staple removal from chest)        Casmir Auguste H 09/10/2012, 8:38 AM

## 2012-09-10 NOTE — Significant Event (Signed)
Rapid Response Event Note Called to see pt s/p fall Overview: Time Called: 2247 Arrival Time: 2250 Event Type: Neurologic  Initial Focused Assessment: On arrival to unit pt was on the floor or his Lt side face down.  He was able to f/c and responded appropriately to questions.  A semi-rigid collar was placed on the pt & he was log rolled onto a transfer board & was then lifted to the bed.  Pt c/o HA & lt shoulder pain but denied neck or chest pain. Guaze dressings & pressure were applied to a hematoma on the left forehead & a small 1in. laceration to the left cheekbone. I spoke with Dr. Laneta Simmers & pt was taken to CT scan.  On arrival back to the floor the pt was further assessed & found to have Lt sided weakness, Lt facial droop, & worsening of dysarthria.  NIH 5.  Dr. Laneta Simmers was updated & the pt was transferred to 2312.   Interventions: Semi-rigid collar placed CT Head & neck  Event Summary: Name of Physician Notified: Laneta Simmers, MD at 2330         [facility], [facility]

## 2012-09-10 NOTE — Progress Notes (Signed)
Chest tube sutures d/c per MD order. Cleansed and steri strips applied. Sites WNL. Dion Saucier

## 2012-09-10 NOTE — Progress Notes (Addendum)
       [address]       [place] [address]             [phone]          3 Days Post-Op Procedure(s) (LRB): PERMANENT PACEMAKER INSERTION (N/A)  Subjective: States he is feeling stronger every day.  No new complaints.   Objective: Vital signs in last 24 hours: Patient Vitals for the past 24 hrs:  BP Temp Temp src Pulse Resp SpO2 Weight  09/10/12 0516 140/97 mmHg 99.2 F (37.3 C) Oral 110 18 99 % 163 lb 5.8 oz (74.1 kg)  09/09/12 1955 137/95 mmHg 98.7 F (37.1 C) Oral 89 18 99 % -  09/09/12 1711 130/73 mmHg 99.3 F (37.4 C) Oral 87 18 - -  09/09/12 1612 99/69 mmHg 99 F (37.2 C) Oral 80 18 - -  09/09/12 1520 115/64 mmHg 98 F (36.7 C) Oral 84 18 - -  09/09/12 1413 113/62 mmHg 98.5 F (36.9 C) Oral 76 18 - -  09/09/12 1403 108/81 mmHg 99.7 F (37.6 C) Oral 85 18 - -  09/09/12 1338 129/91 mmHg 99.3 F (37.4 C) Oral 96 18 96 % -  09/09/12 1023 - - - 100 - - -  09/09/12 0900 127/80 mmHg - - 100 - - -   Current Weight  09/10/12 163 lb 5.8 oz (74.1 kg)  PRE-OPERATIVE WEIGHT: 76 kg    Intake/Output from previous day: 07/02 0701 - 07/03 0700 In: 1092.5 [P.O.:1080; Blood:12.5] Out: 1725 [Urine:1725]    PHYSICAL EXAM:  Heart: Irr irr Lungs: Clear Wound: Clean and dry Extremities: trace LE edema    Lab Results: CBC: Recent Labs  09/08/12 0352 09/09/12 0628 09/09/12 1841  WBC 10.4 9.1  --   HGB 7.9* 7.7* 8.4*  HCT 24.6* 24.7* 26.4*  PLT 127* 170  --    BMET:  Recent Labs  09/08/12 0352 09/09/12 0628  NA 133* 134*  K 3.8 3.6  CL 97 98  CO2 28 27  GLUCOSE 121* 97  BUN 35* 33*  CREATININE 1.51* 1.52*  CALCIUM 8.4 8.5    PT/INR:  Recent Labs  09/10/12 0430  LABPROT 14.3  INR 1.13      Assessment/Plan: S/P Procedure(s) (LRB): PERMANENT PACEMAKER INSERTION (N/A)  CV- rate controlled AF, BPs remain elevated.  Will increase Lopressor and watch. Coumadin started for AF.  Postop blood loss anemia/chronic anemia  pre-op- improved after tx and pt feels much better.  Continue Fe and follow labs. He has had an extensive GI workup as outpatient with no source for blood loss.  Deconditioning- continue PT.  Disp- hopefully home Saturday if he continues to progress. His daughter will be providing assistance.  Will order HHRN and HHPT.    LOS: 9 days    COLLINS,GINA H 09/10/2012  patient examined and medical record reviewed,agree with above note. VAN TRIGT III,PETER 09/10/2012

## 2012-09-11 ENCOUNTER — Inpatient Hospital Stay (HOSPITAL_COMMUNITY): Payer: Medicare PPO

## 2012-09-11 LAB — CBC
HCT: 28.2 % — ABNORMAL LOW (ref 39.0–52.0)
Hemoglobin: 8.7 g/dL — ABNORMAL LOW (ref 13.0–17.0)
MCH: 26.1 pg (ref 26.0–34.0)
MCHC: 30.9 g/dL (ref 30.0–36.0)
MCV: 84.7 fL (ref 78.0–100.0)
Platelets: 210 10*3/uL (ref 150–400)
RBC: 3.33 MIL/uL — ABNORMAL LOW (ref 4.22–5.81)
RDW: 18.5 % — ABNORMAL HIGH (ref 11.5–15.5)
WBC: 8.9 10*3/uL (ref 4.0–10.5)

## 2012-09-11 LAB — BASIC METABOLIC PANEL
BUN: 35 mg/dL — ABNORMAL HIGH (ref 6–23)
CO2: 28 mEq/L (ref 19–32)
Calcium: 8.5 mg/dL (ref 8.4–10.5)
Chloride: 99 mEq/L (ref 96–112)
Creatinine, Ser: 1.65 mg/dL — ABNORMAL HIGH (ref 0.50–1.35)
GFR calc Af Amer: 41 mL/min — ABNORMAL LOW (ref >90)
GFR calc non Af Amer: 35 mL/min — ABNORMAL LOW (ref >90)
Glucose, Bld: 96 mg/dL (ref 70–99)
Potassium: 4.1 mEq/L (ref 3.5–5.1)
Sodium: 135 mEq/L (ref 135–145)

## 2012-09-11 LAB — PROTIME-INR
INR: 1.15 (ref 0.00–1.49)
Prothrombin Time: 14.5 seconds (ref 11.6–15.2)

## 2012-09-11 NOTE — Progress Notes (Signed)
Pt was found by fellow RN on floor face down in pool of urine and blood. Upon arrive to room Rapid Response and respiratory was paged and Dr. Donata Clay was paged.  Pt was A/O x4 when asked but responses were delayed. Received returned call from Dr. Laneta Simmers and phone was passed to Rapid Response RN to provide specific update on situation. Traveled with pt. To CT and pt was still A/Ox4 upon return to pt. Room pt. Reassessed and was A/Ox4 but speech was slowed and slurred from baseline; and left sided weakness of upper and lower extremity.  Paged Dr. Laneta Simmers with updated of pt status and pt. Family notified.  Received orders to transfer pt. To 2300 report was called and pt. Transferred.   Thane Edu, RN

## 2012-09-11 NOTE — Progress Notes (Signed)
Patient ID: Ethan Lawrence, male   DOB: 05-15-1922, 79 y.o.   MRN: [id number]  SICU Evening Rounds:  Stable day. No new neurologic changes. Seems intact today.

## 2012-09-11 NOTE — Progress Notes (Signed)
4 Days Post-Op Procedure(s) (LRB): PERMANENT PACEMAKER INSERTION (N/A) Subjective: Only complaint is of headache.  Objective: Vital signs in last 24 hours: Temp:  [98 F (36.7 C)-99 F (37.2 C)] 98.9 F (37.2 C) (07/04 0801) Pulse Rate:  [64-106] 96 (07/04 0900) Cardiac Rhythm:  [-] Ventricular paced;Atrial fibrillation (07/04 0800) Resp:  [13-25] 25 (07/04 0900) BP: (87-136)/(47-96) 136/80 mmHg (07/04 0900) SpO2:  [78 %-100 %] 78 % (07/04 0900) Weight:  [76.975 kg (169 lb 11.2 oz)] 76.975 kg (169 lb 11.2 oz) (07/04 0500)  Hemodynamic parameters for last 24 hours:    Intake/Output from previous day: 07/03 0701 - 07/04 0700 In: 1080 [P.O.:1080] Out: 3300 [Urine:3300] Intake/Output this shift: Total I/O In: -  Out: 150 [Urine:150]  General appearance: alert and cooperative, swelling and ecchymosis over left cheek and forehead. Neurologic: intact Heart: irregularly irregular rhythm Lungs: clear to auscultation bilaterally Extremities: extremities normal, atraumatic, no cyanosis or edema Wound: incision ok  Lab Results:  Recent Labs  09/09/12 0628 09/09/12 1841 09/11/12 0445  WBC 9.1  --  8.9  HGB 7.7* 8.4* 8.7*  HCT 24.7* 26.4* 28.2*  PLT 170  --  210   BMET:  Recent Labs  09/09/12 0628 09/11/12 0445  NA 134* 135  K 3.6 4.1  CL 98 99  CO2 27 28  GLUCOSE 97 96  BUN 33* 35*  CREATININE 1.52* 1.65*  CALCIUM 8.5 8.5    PT/INR:  Recent Labs  09/11/12 0445  LABPROT 14.5  INR 1.15   ABG    Component Value Date/Time   PHART 7.424 09/03/2012 0420   HCO3 21.6 09/03/2012 0420   TCO2 23 09/03/2012 0420   ACIDBASEDEF 2.0 09/03/2012 0420   O2SAT 92.0 09/03/2012 0420   CBG (last 3)   Recent Labs  09/09/12 0319 09/09/12 0839 09/09/12 0922  GLUCAP 85 102* 156*    Assessment/Plan: S/p redo sternotomy for AVR, TV annuloplasty. S/P Procedure(s) (LRB): PERMANENT PACEMAKER INSERTION (N/A) for postop CHB  He had a fall overnight on 2000. He was  reportedly sitting on the side of the bed and using a urinal and slipped and felt striking the left forehead and cheek. He had a CT of head that showed scalp hematoma but no fracture or bleeding inside skull. No sign of stroke. CT of neck showed no fracture or dislocation. When he arrived back to the floor he was noted by Rapid Response nurse to have some left-sided weakness and left facial droop with dysarthria. He was transferred back to 2300 for observation and this am he has no weakness or dysarthria and ate breakfast. His grandson does note that he has been a little disoriented and repeating things this am which is new for him. It is not clear if he had a TIA last night. Preop carotid dopplers showed no significant disease. He could have had a concussion with his fall. Otherwise he seems to be doing well. I will keep in the SICU to observe today. I think it would be best to stop his coumadin for now.    LOS: 10 days    BARTLE,BRYAN K 09/11/2012

## 2012-09-11 NOTE — Progress Notes (Addendum)
Heard a loud noise from the hallway and found the patient lying on the floor, face down and unresponsive.  Patient was lying in a pool of urine and noticed blood underneath the patient's face; did not move patient.  Called for help and assessed for a pulse which was present.  Patient was groaning but not speaking or answering me.  We were about to call a code when the patient began to respond to questions.  Rapid Response and Resp were paged.  [name] nurse arrived and paged the MD and notified family.    Ethan Lawrence

## 2012-09-11 NOTE — Progress Notes (Signed)
[name] daughter currently at bedside.  Provided thorough education to family about importance of retrieving assistance when moving/ambulating patient.

## 2012-09-12 LAB — BASIC METABOLIC PANEL
BUN: 32 mg/dL — ABNORMAL HIGH (ref 6–23)
Chloride: 98 mEq/L (ref 96–112)
GFR calc non Af Amer: 38 mL/min — ABNORMAL LOW (ref >90)
Glucose, Bld: 103 mg/dL — ABNORMAL HIGH (ref 70–99)
Potassium: 3.9 mEq/L (ref 3.5–5.1)
Sodium: 134 mEq/L — ABNORMAL LOW (ref 135–145)

## 2012-09-12 NOTE — Progress Notes (Signed)
5 Days Post-Op Procedure(s) (LRB): PERMANENT PACEMAKER INSERTION (N/A) Subjective: No complaints. Wants to go home.  Objective: Vital signs in last 24 hours: Temp:  [97.3 F (36.3 C)-99.4 F (37.4 C)] 98.6 F (37 C) (07/05 0724) Pulse Rate:  [50-102] 100 (07/05 0800) Cardiac Rhythm:  [-] Atrial fibrillation;Ventricular paced (07/05 0730) Resp:  [15-25] 21 (07/05 0800) BP: (83-128)/(51-82) 113/74 mmHg (07/05 0800) SpO2:  [91 %-100 %] 97 % (07/05 0800)  Hemodynamic parameters for last 24 hours:    Intake/Output from previous day: 07/04 0701 - 07/05 0700 In: 1280 [P.O.:1280] Out: 1850 [Urine:1850] Intake/Output this shift: Total I/O In: 340 [P.O.:340] Out: -   General appearance: alert and cooperative Neurologic: intact Heart: irregularly irregular rhythm Lungs: clear to auscultation bilaterally Extremities: extremities normal, atraumatic, no cyanosis or edema Wound: incisions ok  Lab Results:  Recent Labs  09/09/12 1841 09/11/12 0445  WBC  --  8.9  HGB 8.4* 8.7*  HCT 26.4* 28.2*  PLT  --  210   BMET:  Recent Labs  09/11/12 0445 09/12/12 0355  NA 135 134*  K 4.1 3.9  CL 99 98  CO2 28 30  GLUCOSE 96 103*  BUN 35* 32*  CREATININE 1.65* 1.57*  CALCIUM 8.5 8.4    PT/INR:  Recent Labs  09/11/12 0445  LABPROT 14.5  INR 1.15   ABG    Component Value Date/Time   PHART 7.424 09/03/2012 0420   HCO3 21.6 09/03/2012 0420   TCO2 23 09/03/2012 0420   ACIDBASEDEF 2.0 09/03/2012 0420   O2SAT 92.0 09/03/2012 0420   CBG (last 3)  No results found for this basename: GLUCAP,  in the last 72 hours  Assessment/Plan: S/P Procedure(s) (LRB): PERMANENT PACEMAKER INSERTION (N/A) Mobilize Plan for transfer to step-down: see transfer orders If he has a stable day he could go home tomorrow.   LOS: 11 days    BARTLE,BRYAN K 09/12/2012

## 2012-09-12 NOTE — Significant Event (Addendum)
1325pm-patient has been transferred safely to 2016, via wheelchair. VS stable prior and during the transfer. [name] daughter aware of the transfer and room number. All personal belongings (top denture, eye-glasses, and a black-colored watch are worn by patient), other belongings include a black cell phone, CD players X 2, head-phones X 2, and 10 CDS are at bedside of 2016, and are accounted for by patient and receiving RN Fayrene Fearing. RN and NA in room. Patient settled in bed, call-bell, personal belongings within reach. Ethan Lawrence, Charity fundraiser.

## 2012-09-13 MED ORDER — TRAMADOL HCL 50 MG PO TABS: 50.0000 mg | ORAL_TABLET | Freq: Four times a day (QID) | ORAL | Status: DC | PRN

## 2012-09-13 MED ORDER — METOPROLOL TARTRATE 25 MG PO TABS
25.0000 mg | ORAL_TABLET | Freq: Two times a day (BID) | ORAL | Status: DC
  Filled 2012-09-13: qty 1

## 2012-09-13 MED ORDER — PREDNISONE 5 MG PO TABS: 5.0000 mg | ORAL_TABLET | Freq: Every day | ORAL | Status: DC

## 2012-09-13 MED ORDER — ASPIRIN 325 MG PO TBEC: 325.0000 mg | DELAYED_RELEASE_TABLET | Freq: Every day | ORAL | Status: DC

## 2012-09-13 MED ORDER — METOPROLOL TARTRATE 25 MG PO TABS: 25.0000 mg | ORAL_TABLET | Freq: Two times a day (BID) | ORAL | Status: DC

## 2012-09-13 MED ORDER — THIAMINE HCL 100 MG PO TABS: 100.0000 mg | ORAL_TABLET | Freq: Every day | ORAL | Status: DC

## 2012-09-13 NOTE — Progress Notes (Signed)
      [address]       [place] [address]             [phone]      6 Days Post-Op  Procedure(s) (LRB): PERMANENT PACEMAKER INSERTION (N/A) Subjective: Wants to go home, no new issues  Objective  Telemetry afib with pvc's, rate 80's to low 100's  Temp:  [98.3 F (36.8 C)-99.6 F (37.6 C)] 99.2 F (37.3 C) (07/06 0323) Pulse Rate:  [80-96] 96 (07/06 0323) Resp:  [15-20] 20 (07/06 0323) BP: (90-139)/(45-88) 139/88 mmHg (07/06 0323) SpO2:  [94 %-99 %] 95 % (07/06 0323) Weight:  [163 lb 3.2 oz (74.027 kg)] 163 lb 3.2 oz (74.027 kg) (07/06 0323)   Intake/Output Summary (Last 24 hours) at 09/13/12 0808 Last data filed at 09/13/12 0324  Gross per 24 hour  Intake    640 ml  Output   1275 ml  Net   -635 ml       General appearance: no distress Heart: irregularly irregular rhythm Lungs: clear to auscultation bilaterally Abdomen: nenign Extremities: no edema Wound: incisions healing well  Lab Results:  Recent Labs  09/11/12 0445 09/12/12 0355  NA 135 134*  K 4.1 3.9  CL 99 98  CO2 28 30  GLUCOSE 96 103*  BUN 35* 32*  CREATININE 1.65* 1.57*  CALCIUM 8.5 8.4   No results found for this basename: AST, ALT, ALKPHOS, BILITOT, PROT, ALBUMIN,  in the last 72 hours No results found for this basename: LIPASE, AMYLASE,  in the last 72 hours  Recent Labs  09/11/12 0445  WBC 8.9  HGB 8.7*  HCT 28.2*  MCV 84.7  PLT 210   No results found for this basename: CKTOTAL, CKMB, TROPONINI,  in the last 72 hours No components found with this basename: POCBNP,  No results found for this basename: DDIMER,  in the last 72 hours No results found for this basename: HGBA1C,  in the last 72 hours No results found for this basename: CHOL, HDL, LDLCALC, TRIG, CHOLHDL,  in the last 72 hours No results found for this basename: TSH, T4TOTAL, FREET3, T3FREE, THYROIDAB,  in the last 72 hours No results found for this basename: VITAMINB12, FOLATE, FERRITIN, TIBC,  IRON, RETICCTPCT,  in the last 72 hours  Medications: Scheduled . aspirin EC  325 mg Oral Daily  . ferrous fumarate-b12-vitamic C-folic acid  1 capsule Oral TID PC  . gabapentin  300 mg Oral BID  . metoprolol tartrate  25 mg Oral q morning - 10a  . predniSONE  5 mg Oral Q breakfast  . rOPINIRole  2 mg Oral QHS  . simvastatin  20 mg Oral q1800  . sodium chloride  3 mL Intravenous Q12H  . thiamine  100 mg Oral Daily     Radiology/Studies:  No results found.  INR: Will add last result for INR, ABG once components are confirmed Will add last 4 CBG results once components are confirmed  Assessment/Plan: S/P Procedure(s) (LRB): PERMANENT PACEMAKER INSERTION (N/A) Plan for discharge: see discharge orders Will increase beta blocker dose as pacer in place No coumadin per MD  LOS: 12 days    Ethan Lawrence E 7/6/20148:08 AM

## 2012-09-13 NOTE — Progress Notes (Signed)
Discharged to home with family office visits in place teaching done  

## 2012-09-15 ENCOUNTER — Other Ambulatory Visit: Payer: Self-pay | Admitting: *Deleted

## 2012-09-15 DIAGNOSIS — I350 Nonrheumatic aortic (valve) stenosis: Secondary | ICD-10-CM

## 2012-09-15 NOTE — Discharge Summary (Signed)
patient examined and medical record reviewed,agree with above note. VAN TRIGT III,Ethan Lawrence 09/15/2012   

## 2012-09-16 ENCOUNTER — Encounter: Payer: Self-pay | Admitting: Internal Medicine

## 2012-09-16 ENCOUNTER — Ambulatory Visit (INDEPENDENT_AMBULATORY_CARE_PROVIDER_SITE_OTHER): Payer: Medicare PPO | Admitting: *Deleted

## 2012-09-16 DIAGNOSIS — I4891 Unspecified atrial fibrillation: Secondary | ICD-10-CM

## 2012-09-16 LAB — PACEMAKER DEVICE OBSERVATION
BMOD-0003RV: 30
BRDY-0002RV: 70 {beats}/min
BRDY-0004RV: 100 {beats}/min
RV LEAD AMPLITUDE: 15.67 mv
RV LEAD IMPEDENCE PM: 620 Ohm
VENTRICULAR PACING PM: 9

## 2012-09-16 NOTE — Progress Notes (Signed)
Wound check-PPM in office. 

## 2012-09-18 ENCOUNTER — Ambulatory Visit (INDEPENDENT_AMBULATORY_CARE_PROVIDER_SITE_OTHER): Payer: Self-pay | Admitting: *Deleted

## 2012-09-18 ENCOUNTER — Other Ambulatory Visit: Payer: Self-pay | Admitting: *Deleted

## 2012-09-18 ENCOUNTER — Ambulatory Visit
Admission: RE | Admit: 2012-09-18 | Discharge: 2012-09-18 | Disposition: A | Discharge status: Home / Self Care | Payer: Medicare PPO | Source: Ambulatory Visit | Attending: Cardiothoracic Surgery | Admitting: Cardiothoracic Surgery

## 2012-09-18 VITALS — BP 90/68 | HR 106 | Resp 18

## 2012-09-18 DIAGNOSIS — R0602 Shortness of breath: Secondary | ICD-10-CM

## 2012-09-18 DIAGNOSIS — J90 Pleural effusion, not elsewhere classified: Secondary | ICD-10-CM

## 2012-09-18 DIAGNOSIS — Z952 Presence of prosthetic heart valve: Secondary | ICD-10-CM

## 2012-09-18 DIAGNOSIS — Z4802 Encounter for removal of sutures: Secondary | ICD-10-CM

## 2012-09-18 DIAGNOSIS — I350 Nonrheumatic aortic (valve) stenosis: Secondary | ICD-10-CM

## 2012-09-18 DIAGNOSIS — I2510 Atherosclerotic heart disease of native coronary artery without angina pectoris: Secondary | ICD-10-CM

## 2012-09-18 DIAGNOSIS — Z9889 Other specified postprocedural states: Secondary | ICD-10-CM

## 2012-09-18 DIAGNOSIS — I071 Rheumatic tricuspid insufficiency: Secondary | ICD-10-CM

## 2012-09-18 MED ORDER — FUROSEMIDE 40 MG PO TABS: 40.0000 mg | ORAL_TABLET | Freq: Every day | ORAL | Status: DC

## 2012-09-18 NOTE — Progress Notes (Signed)
Ethan Lawrence returns to the office accompanied by his daughter for removal of staples from his sternal incision and right axillary anterior chest incision. These as well as his previous chest tube sites are very well healed and the steri-strips were removed.  Benzoin and ster-strips were applied to his sternal incision.  He c/o some shortness of breath and he has some residual ankle edema. Lung sounds are clear except for the bases, which are completely absent.  He was sent for a chest xray and it was reviewed by Dr. Donata Clay.  He ordered Lasix 40 mg per day and the instructions were given to him and his daughter.  He was given a f/u appt. He sees Dr. Verdis Prime next week.

## 2012-09-19 ENCOUNTER — Telehealth: Payer: Self-pay | Admitting: Thoracic Surgery (Cardiothoracic Vascular Surgery)

## 2012-09-19 NOTE — Telephone Encounter (Signed)
[name] daughter called to state that the patient had been short of breath.  He was seen in the office yesterday and lasix dose increased.  CXR looks good.  I instructed the patient's daughter to bring him to the ED for evaluation if his breathing continued to deteriorate.  Ethan Lawrence H 09/19/2012 8:08 PM

## 2012-09-21 ENCOUNTER — Other Ambulatory Visit: Payer: Self-pay | Admitting: Internal Medicine

## 2012-09-28 ENCOUNTER — Telehealth: Payer: Self-pay | Admitting: Internal Medicine

## 2012-09-28 NOTE — Telephone Encounter (Signed)
Dr Katrinka Blazing is his primary cardiologist.  His office has been notified and she is waiting for a call back.  i let her know if she does not hear back from them then call me back and I would be glad to help

## 2012-09-28 NOTE — Telephone Encounter (Signed)
New problem   Calling for advanced home care about the pts b/p

## 2012-09-29 DIAGNOSIS — I4891 Unspecified atrial fibrillation: Secondary | ICD-10-CM

## 2012-09-30 ENCOUNTER — Ambulatory Visit
Admission: RE | Admit: 2012-09-30 | Discharge: 2012-09-30 | Disposition: A | Discharge status: Home / Self Care | Payer: Medicare PPO | Source: Ambulatory Visit | Attending: Cardiothoracic Surgery | Admitting: Cardiothoracic Surgery

## 2012-09-30 ENCOUNTER — Ambulatory Visit (INDEPENDENT_AMBULATORY_CARE_PROVIDER_SITE_OTHER): Payer: Self-pay | Admitting: Cardiothoracic Surgery

## 2012-09-30 VITALS — BP 140/58 | HR 83 | Resp 16 | Ht 70.0 in | Wt 157.5 lb

## 2012-09-30 DIAGNOSIS — I359 Nonrheumatic aortic valve disorder, unspecified: Secondary | ICD-10-CM

## 2012-09-30 DIAGNOSIS — Z9889 Other specified postprocedural states: Secondary | ICD-10-CM

## 2012-09-30 DIAGNOSIS — I350 Nonrheumatic aortic (valve) stenosis: Secondary | ICD-10-CM

## 2012-09-30 DIAGNOSIS — I079 Rheumatic tricuspid valve disease, unspecified: Secondary | ICD-10-CM

## 2012-09-30 DIAGNOSIS — Z954 Presence of other heart-valve replacement: Secondary | ICD-10-CM

## 2012-09-30 DIAGNOSIS — Z952 Presence of prosthetic heart valve: Secondary | ICD-10-CM

## 2012-09-30 DIAGNOSIS — I071 Rheumatic tricuspid insufficiency: Secondary | ICD-10-CM

## 2012-09-30 NOTE — Progress Notes (Signed)
PCP is VAN Dinah Beers, MD Referring Provider is Lesleigh Noe, MD  Chief Complaint  Patient presents with  . Routine Post Op    3 wk f/u with cxr    HPI: Patient returns for her first office visit after redo sternotomy, aortic valve replacement with a 21 mm pericardial valve and tricuspid valve ring annuloplasty for severe TR secondary to pulmonary hypertension. The patient had CABG x48 years ago in all grafts were patent. He is in chronic atrial fibrillation on Coumadin. He had a prolonged recovery due to his advanced age of 23 years and need for a pacemaker for bradycardia. He is at home physical therapy and is slowly progressing. He has chronic anemia but his last hemoglobin was 10.0 on oral iron. His had no bleeding complications from the Coumadin. His chest x-ray today shows some small bilateral pleural effusions. He has mild ankle edema on exam and some problems with orthopnea at night intermittently. He has not been on Lasix daily due to compliance issues   Past Medical History  Diagnosis Date  . Hypertension   . Hyperlipidemia   . CAD (coronary artery disease)     a. 05/2004 CABGx4 (LIMA->LAD, VG->RI, VG->OM, VG->RCA);  b. 08/2012 Cath: Native 3VD with 3/4 patent grafts (VG->RI occluded).  . Atrial fibrillation   . Complication of anesthesia     Prefers depravant  . Macular degeneration   . Aortic stenosis     a. 08/2012 TEE: EF 45-55%, critical AS, Triv AI, mild to mod MR, Sev TR;  b. 08/2012 AVR (21mm Magna Ease pericardial tissue valve) & TV Repair (30mm MC3 annuloplasty ring).  Marland Kitchen AAA (abdominal aortic aneurysm)     a. 06/2010 s/p Endovascular AAA Repair  . 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 Endovascular stent insertion  07/05/2010  . Appendectomy    . Coronary artery bypass graft    . Vascular surgery    . Back surgery    . Cardiac catheterization    .  Esophagogastroduodenoscopy  06/19/2011    Procedure: ESOPHAGOGASTRODUODENOSCOPY (EGD);  Surgeon: Graylin Shiver, MD;  Location: [facility] OR;  Service: Gastroenterology;  Laterality: N/A;  . Colonoscopy  06/19/2011    Procedure: COLONOSCOPY;  Surgeon: Graylin Shiver, MD;  Location: [facility] OR;  Service: Gastroenterology;  Laterality: N/A;  . Eye surgery      bilater cataracts removed  . Aortic valve replacement N/A 09/01/2012    Procedure: REDO AORTIC VALVE REPLACEMENT (AVR);  Surgeon: Kerin Perna, MD;  Location: [facility] OR;  Service: Open Heart Surgery;  Laterality: N/A;  . Tricuspid valve replacement N/A 09/01/2012    Procedure: TRICUSPID VALVE REPAIR;  Surgeon: Kerin Perna, MD;  Location: [facility] OR;  Service: Open Heart Surgery;  Laterality: N/A;  . Intraoperative transesophageal echocardiogram N/A 09/01/2012    Procedure: INTRAOPERATIVE TRANSESOPHAGEAL ECHOCARDIOGRAM;  Surgeon: Kerin Perna, MD;  Location: [facility] OR;  Service: Open Heart Surgery;  Laterality: N/A;  . Pacemaker insertion  09/07/2012    Medtronic Sensia single chamber pacemaker implanted by Dr Ladona Ridgel     Family History  Problem Relation Age of Onset  . Coronary artery disease Brother     Social History History  Substance Use Topics  . Smoking status: Former Smoker    Quit date: 10/29/1975  . Smokeless tobacco: Never Used  . Alcohol Use: No  Current Outpatient Prescriptions  Medication Sig Dispense Refill  . docusate sodium (COLACE) 100 MG capsule Take 100 mg by mouth 2 (two) times daily.      . ferrous sulfate 325 (65 FE) MG EC tablet Take 325 mg by mouth 2 (two) times daily.      . folic acid (FOLVITE) 1 MG tablet Take 1 mg by mouth daily.       Marland Kitchen gabapentin (NEURONTIN) 300 MG capsule Take 300 mg by mouth 2 (two) times daily.      Marland Kitchen HYDROcodone-acetaminophen (NORCO) 10-325 MG per tablet Take 1 tablet by mouth every 6 (six) hours as needed for pain.      . hydrocortisone cream 1 % Apply 1 application topically 2 (two) times daily  as needed (for leg itching).      . metoprolol tartrate (LOPRESSOR) 25 MG tablet TAKE 1 TABLET BY MOUTH TWICE DAILY  60 tablet  3  . Multiple Vitamins-Minerals (PRESERVISION AREDS 2) CAPS Take 1 tablet by mouth 2 (two) times daily.      Marland Kitchen omeprazole (PRILOSEC) 20 MG capsule Take 20 mg by mouth daily.      . predniSONE (DELTASONE) 5 MG tablet Take 1 tablet (5 mg total) by mouth daily with breakfast.  30 tablet  1  . rOPINIRole (REQUIP) 2 MG tablet Take 2 mg by mouth at bedtime.      . simvastatin (ZOCOR) 20 MG tablet Take 20 mg by mouth at bedtime.       Marland Kitchen spironolactone (ALDACTONE) 25 MG tablet Take 25 mg by mouth daily.      Marland Kitchen thiamine 100 MG tablet Take 1 tablet (100 mg total) by mouth daily.      . traMADol (ULTRAM) 50 MG tablet Take 1 tablet (50 mg total) by mouth every 6 (six) hours as needed.  50 tablet  0  . warfarin (COUMADIN) 6 MG tablet Take 6 mg by mouth daily. TAKE AS DIRECTED.      . furosemide (LASIX) 40 MG tablet Take 1 tablet (40 mg total) by mouth daily.  14 tablet  0   No current facility-administered medications for this visit.    No Known Allergies  Review of Systems appetite and strength slowly improving. The patient is walking at home and also has home physical therapist working with them. Incisions are healing well. No blood per or bleeding complications from Coumadin  BP 140/58  Pulse 83  Resp 16  Ht 5\' 10"  (1.778 m)  Wt 157 lb 8 oz (71.442 kg)  BMI 22.6 kg/m2  SpO2 95% Physical Exam Alert and responsive Lungs with few scattered rales right> left bases Irregular heart rhythm-atrial fibrillation Soft systolic flow murmur through aortic valve Minimal ankle edema   Diagnostic Tests: Chest x-ray reviewed showing small bilateral pleural effusions  Impression: Slow progress following redo 4 aVR and tricuspid valve repair. We'll start patient on daily Aldactone 25 mg to help with fluid management and probable right heart dysfunction.  Plan: We'll plan on  seeing patient back for chest x-ray in the proximal to 4 weeks. He knows he can start driving short distances lifting up to 10 pounds and that he should try to take a 5-10 minute walk every day.

## 2012-10-07 ENCOUNTER — Ambulatory Visit: Payer: Medicare PPO | Admitting: Cardiothoracic Surgery

## 2012-10-20 ENCOUNTER — Encounter: Payer: Self-pay | Admitting: Internal Medicine

## 2012-10-26 ENCOUNTER — Other Ambulatory Visit: Payer: Self-pay | Admitting: *Deleted

## 2012-10-26 DIAGNOSIS — I350 Nonrheumatic aortic (valve) stenosis: Secondary | ICD-10-CM

## 2012-10-28 ENCOUNTER — Ambulatory Visit (INDEPENDENT_AMBULATORY_CARE_PROVIDER_SITE_OTHER): Payer: Self-pay | Admitting: Cardiothoracic Surgery

## 2012-10-28 ENCOUNTER — Ambulatory Visit: Payer: Medicare PPO | Admitting: Cardiothoracic Surgery

## 2012-10-28 ENCOUNTER — Ambulatory Visit
Admission: RE | Admit: 2012-10-28 | Discharge: 2012-10-28 | Disposition: A | Discharge status: Home / Self Care | Payer: Medicare PPO | Source: Ambulatory Visit | Attending: Cardiothoracic Surgery | Admitting: Cardiothoracic Surgery

## 2012-10-28 ENCOUNTER — Encounter: Payer: Self-pay | Admitting: Cardiothoracic Surgery

## 2012-10-28 VITALS — BP 104/71 | HR 73 | Resp 18 | Ht 70.0 in | Wt 160.0 lb

## 2012-10-28 DIAGNOSIS — Z9889 Other specified postprocedural states: Secondary | ICD-10-CM

## 2012-10-28 DIAGNOSIS — Z954 Presence of other heart-valve replacement: Secondary | ICD-10-CM

## 2012-10-28 DIAGNOSIS — I350 Nonrheumatic aortic (valve) stenosis: Secondary | ICD-10-CM

## 2012-10-28 DIAGNOSIS — I079 Rheumatic tricuspid valve disease, unspecified: Secondary | ICD-10-CM

## 2012-10-28 DIAGNOSIS — I071 Rheumatic tricuspid insufficiency: Secondary | ICD-10-CM

## 2012-10-28 DIAGNOSIS — Z952 Presence of prosthetic heart valve: Secondary | ICD-10-CM

## 2012-10-28 DIAGNOSIS — J90 Pleural effusion, not elsewhere classified: Secondary | ICD-10-CM

## 2012-10-28 DIAGNOSIS — I359 Nonrheumatic aortic valve disorder, unspecified: Secondary | ICD-10-CM

## 2012-10-29 NOTE — Progress Notes (Signed)
PCP is VAN Dinah Beers, MD Referring Provider is Lesleigh Noe, MD  Chief Complaint  Patient presents with  . 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 On his last visit he had evidence of fluid retention with bilateral pleural effusions and some lower extremity edema. He was placed on Aldactone and his edema is improved. His chest x-ray shows significant improvement in aeration and resolution of the pleural effusions. The patient continues to have some late day orthopnea. He is currently on Coumadin without bleeding complications. His INR has been between 2.0 2.5.  Past Medical History  Diagnosis Date  . Hypertension   . Hyperlipidemia   . CAD (coronary artery disease)     a. 05/2004 CABGx4 (LIMA->LAD, VG->RI, VG->OM, VG->RCA);  b. 08/2012 Cath: Native 3VD with 3/4 patent grafts (VG->RI occluded).  . Atrial fibrillation   . Complication of anesthesia     Prefers depravant  . Macular degeneration   . Aortic stenosis     a. 08/2012 TEE: EF 45-55%, critical AS, Triv AI, mild to mod MR, Sev TR;  b. 08/2012 AVR (21mm Magna Ease pericardial tissue valve) & TV Repair (30mm MC3 annuloplasty ring).  Marland Kitchen AAA (abdominal aortic aneurysm)     a. 06/2010 s/p Endovascular AAA Repair  . 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 Endovascular stent insertion  07/05/2010  . Appendectomy    . Coronary artery bypass graft    . Vascular surgery    . Back surgery    . Cardiac catheterization    .  Esophagogastroduodenoscopy  06/19/2011    Procedure: ESOPHAGOGASTRODUODENOSCOPY (EGD);  Surgeon: Graylin Shiver, MD;  Location: [facility] OR;  Service: Gastroenterology;  Laterality: N/A;  . Colonoscopy  06/19/2011    Procedure: COLONOSCOPY;  Surgeon: Graylin Shiver, MD;  Location: [facility] OR;  Service: Gastroenterology;  Laterality: N/A;  . Eye surgery      bilater cataracts removed  . Aortic valve replacement N/A 09/01/2012    Procedure: REDO AORTIC VALVE REPLACEMENT (AVR);  Surgeon: Kerin Perna, MD;  Location: [facility] OR;  Service: Open Heart Surgery;  Laterality: N/A;  . Tricuspid valve replacement N/A 09/01/2012    Procedure: TRICUSPID VALVE REPAIR;  Surgeon: Kerin Perna, MD;  Location: [facility] OR;  Service: Open Heart Surgery;  Laterality: N/A;  . Intraoperative transesophageal echocardiogram N/A 09/01/2012    Procedure: INTRAOPERATIVE TRANSESOPHAGEAL ECHOCARDIOGRAM;  Surgeon: Kerin Perna, MD;  Location: [facility] OR;  Service: Open Heart Surgery;  Laterality: N/A;  . Pacemaker insertion  09/07/2012    Medtronic Sensia single chamber pacemaker implanted by Dr Ladona Ridgel     Family History  Problem Relation Age of Onset  . Coronary artery disease Brother     Social History History  Substance Use Topics  . Smoking status: Former Smoker    Quit date: 10/29/1975  . Smokeless tobacco: Never Used  . Alcohol Use: No    Current Outpatient Prescriptions  Medication Sig Dispense Refill  . docusate sodium (  COLACE) 100 MG capsule Take 100 mg by mouth 2 (two) times daily.      . ferrous sulfate 325 (65 FE) MG EC tablet Take 325 mg by mouth 2 (two) times daily.      . folic acid (FOLVITE) 1 MG tablet Take 1 mg by mouth daily.       Marland Kitchen gabapentin (NEURONTIN) 300 MG capsule Take 300 mg by mouth 2 (two) times daily.      Marland Kitchen HYDROcodone-acetaminophen (NORCO) 10-325 MG per tablet Take 1 tablet by mouth every 6 (six) hours as needed for pain.      . hydrocortisone cream 1 % Apply 1 application topically 2 (two) times daily  as needed (for leg itching).      . metoprolol tartrate (LOPRESSOR) 25 MG tablet TAKE 1 TABLET BY MOUTH TWICE DAILY  60 tablet  3  . Multiple Vitamins-Minerals (PRESERVISION AREDS 2) CAPS Take 1 tablet by mouth 2 (two) times daily.      Marland Kitchen omeprazole (PRILOSEC) 20 MG capsule Take 20 mg by mouth daily.      . predniSONE (DELTASONE) 5 MG tablet Take 1 tablet (5 mg total) by mouth daily with breakfast.  30 tablet  1  . rOPINIRole (REQUIP) 2 MG tablet Take 2 mg by mouth at bedtime.      . simvastatin (ZOCOR) 20 MG tablet Take 20 mg by mouth at bedtime.       Marland Kitchen spironolactone (ALDACTONE) 25 MG tablet Take 25 mg by mouth daily.      Marland Kitchen thiamine 100 MG tablet Take 1 tablet (100 mg total) by mouth daily.      . traMADol (ULTRAM) 50 MG tablet Take 1 tablet (50 mg total) by mouth every 6 (six) hours as needed.  50 tablet  0  . warfarin (COUMADIN) 6 MG tablet Take 6 mg by mouth daily. TAKE AS DIRECTED.       No current facility-administered medications for this visit.    No Known Allergies  Review of Systems good appetite but has not started again his weight back yet. Very active and good exercise tolerance now  BP 104/71  Pulse 73  Resp 18  Ht 5\' 10"  (1.778 m)  Wt 160 lb (72.576 kg)  BMI 22.96 kg/m2  SpO2 % Physical Exam Alert and responsive Breath sounds clear Pulse irregular-controlled A. Fib Minimal pedal edema Surgical incisions all well-healed  Diagnostic Tests: Chest x-ray with clear lung fields resolution of pleural effusions  Impression: Doing well and ready to return to his home in [place]. He'll continue his current medications under direct Dr. Verdis Prime.   Plan: Return for surgical followup as needed. Continue diuretic therapy for persistent orthopnea.

## 2012-11-16 ENCOUNTER — Other Ambulatory Visit: Payer: Self-pay | Admitting: *Deleted

## 2012-11-24 ENCOUNTER — Encounter: Payer: Self-pay | Admitting: Internal Medicine

## 2012-12-25 ENCOUNTER — Encounter: Payer: Self-pay | Admitting: Internal Medicine

## 2012-12-25 ENCOUNTER — Ambulatory Visit (INDEPENDENT_AMBULATORY_CARE_PROVIDER_SITE_OTHER): Payer: Medicare PPO | Admitting: Internal Medicine

## 2012-12-25 VITALS — BP 132/80 | HR 81 | Ht 69.0 in | Wt 163.4 lb

## 2012-12-25 DIAGNOSIS — I4891 Unspecified atrial fibrillation: Secondary | ICD-10-CM

## 2012-12-25 DIAGNOSIS — I10 Essential (primary) hypertension: Secondary | ICD-10-CM

## 2012-12-25 DIAGNOSIS — I482 Chronic atrial fibrillation, unspecified: Secondary | ICD-10-CM

## 2012-12-25 LAB — PACEMAKER DEVICE OBSERVATION
BATTERY VOLTAGE: 2.79 V
BMOD-0005RV: 90 {beats}/min
BRDY-0002RV: 70 {beats}/min
RV LEAD IMPEDENCE PM: 505 Ohm
RV LEAD THRESHOLD: 0.5 V

## 2012-12-25 NOTE — Patient Instructions (Addendum)
Your physician wants you to follow-up in: 6/21015 with Dr Stevan Born will receive a reminder letter in the mail two months in advance. If you don't receive a letter, please call our office to schedule the follow-up appointment.  Remote monitoring is used to monitor your Pacemaker of ICD from home. This monitoring reduces the number of office visits required to check your device to one time per year. It allows [place] to keep an eye on the functioning of your device to ensure it is working properly. You are scheduled for a device check from home on 03/29/13. You may send your transmission at any time that day. If you have a wireless device, the transmission will be sent automatically. After your physician reviews your transmission, you will receive a postcard with your next transmission date.

## 2012-12-27 ENCOUNTER — Encounter: Payer: Self-pay | Admitting: Internal Medicine

## 2012-12-27 NOTE — Progress Notes (Signed)
HPI [name] returns today for followup. He is a very pleasant 79 year old man with a history of aortic stenosis status post aortic valve replacement carried out approximately 4 months ago, which was complicated by the development of complete heart block for which the patient underwent insertion of a permanent pacemaker. In the interim he has been stable. He had a very slow but steady recovery after his valve replacement surgery. He has class II heart failure symptoms. He denies chest pain or syncope. The patient also has atrial fibrillation with a controlled ventricular response. No Known Allergies   Current Outpatient Prescriptions  Medication Sig Dispense Refill  . docusate sodium (COLACE) 100 MG capsule Take 100 mg by mouth 2 (two) times daily.      . ferrous sulfate 325 (65 FE) MG EC tablet Take 325 mg by mouth 2 (two) times daily.      . folic acid (FOLVITE) 1 MG tablet Take 1 mg by mouth daily.       Marland Kitchen gabapentin (NEURONTIN) 300 MG capsule Take 300 mg by mouth 2 (two) times daily.      Marland Kitchen HYDROcodone-acetaminophen (NORCO) 10-325 MG per tablet Take 1 tablet by mouth every 6 (six) hours as needed for pain.      . hydrocortisone cream 1 % Apply 1 application topically 2 (two) times daily as needed (for leg itching).      . metoprolol tartrate (LOPRESSOR) 25 MG tablet TAKE 1 TABLET BY MOUTH TWICE DAILY  60 tablet  3  . Multiple Vitamins-Minerals (PRESERVISION AREDS 2) CAPS Take 1 tablet by mouth 2 (two) times daily.      Marland Kitchen omeprazole (PRILOSEC) 20 MG capsule Take 20 mg by mouth daily.      . predniSONE (DELTASONE) 5 MG tablet Take 1 tablet (5 mg total) by mouth daily with breakfast.  30 tablet  1  . rOPINIRole (REQUIP) 2 MG tablet Take 2 mg by mouth at bedtime.      . simvastatin (ZOCOR) 20 MG tablet Take 20 mg by mouth at bedtime.       Marland Kitchen spironolactone (ALDACTONE) 25 MG tablet Take 25 mg by mouth daily.      Marland Kitchen thiamine 100 MG tablet Take 1 tablet (100 mg total) by mouth  daily.      . traMADol (ULTRAM) 50 MG tablet Take 1 tablet (50 mg total) by mouth every 6 (six) hours as needed.  50 tablet  0  . warfarin (COUMADIN) 6 MG tablet Take 6 mg by mouth daily. TAKE AS DIRECTED.       No current facility-administered medications for this visit.     Past Medical History  Diagnosis Date  . Hypertension   . Hyperlipidemia   . CAD (coronary artery disease)     a. 05/2004 CABGx4 (LIMA->LAD, VG->RI, VG->OM, VG->RCA);  b. 08/2012 Cath: Native 3VD with 3/4 patent grafts (VG->RI occluded).  . Atrial fibrillation   . Complication of anesthesia     Prefers depravant  . Macular degeneration   . Aortic stenosis     a. 08/2012 TEE: EF 45-55%, critical AS, Triv AI, mild to mod MR, Sev TR;  b. 08/2012 AVR (21mm Magna Ease pericardial tissue valve) & TV Repair (30mm MC3 annuloplasty ring).  Marland Kitchen AAA (abdominal aortic aneurysm)     a. 06/2010 s/p Endovascular AAA Repair  . 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   Past Surgical History  Procedure Laterality Date  . Endovascular stent insertion  07/05/2010  . Appendectomy    . Coronary artery bypass graft    . Vascular surgery    . Back surgery    . Cardiac catheterization    . Esophagogastroduodenoscopy  06/19/2011    Procedure: ESOPHAGOGASTRODUODENOSCOPY (EGD);  Surgeon: Graylin Shiver, MD;  Location: [facility] OR;  Service: Gastroenterology;  Laterality: N/A;  . Colonoscopy  06/19/2011    Procedure: COLONOSCOPY;  Surgeon: Graylin Shiver, MD;  Location: [facility] OR;  Service: Gastroenterology;  Laterality: N/A;  . Eye surgery      bilater cataracts removed  . Aortic valve replacement N/A 09/01/2012    Procedure: REDO AORTIC VALVE REPLACEMENT (AVR);  Surgeon: Kerin Perna, MD;  Location: [facility] OR;  Service: Open Heart Surgery;  Laterality: N/A;  . Tricuspid valve replacement N/A 09/01/2012    Procedure: TRICUSPID VALVE  REPAIR;  Surgeon: Kerin Perna, MD;  Location: [facility] OR;  Service: Open Heart Surgery;  Laterality: N/A;  . Intraoperative transesophageal echocardiogram N/A 09/01/2012    Procedure: INTRAOPERATIVE TRANSESOPHAGEAL ECHOCARDIOGRAM;  Surgeon: Kerin Perna, MD;  Location: [facility] OR;  Service: Open Heart Surgery;  Laterality: N/A;  . Pacemaker insertion  09/07/2012    Medtronic Sensia single chamber pacemaker implanted by Dr Ladona Ridgel      Family History  Problem Relation Age of Onset  . Coronary artery disease Brother      History   Social History  . Marital Status: Married    Spouse Name: N/A    Number of Children: N/A  . Years of Education: N/A   Occupational History  . Not on file.   Social History Main Topics  . Smoking status: Former Smoker    Quit date: 10/29/1975  . Smokeless tobacco: Never Used  . Alcohol Use: No  . Drug Use: No  . Sexual Activity: Not on file   Other Topics Concern  . Not on file   Social History Narrative   Pt lives in [place] with his wife.  He receives most of his medical care in [place] and stays with his dtr here when necessary.     BP 132/80  Pulse 81  Ht 5\' 9"  (1.753 m)  Wt 163 lb 6.4 oz (74.118 kg)  BMI 24.12 kg/m2  Physical Exam:  Well appearing elderly man, NAD HEENT: Unremarkable Neck:  7 cm JVD, no thyromegally Back:  No CVA tenderness Lungs:  Clear with no wheezes, rales, or rhonchi. HEART:  IRegular rate rhythm, no murmurs, no rubs, no clicks Abd:  soft, positive bowel sounds, no organomegally, no rebound, no guarding Ext:  2 plus pulses, no edema, no cyanosis, no clubbing Skin:  No rashes no nodules Neuro:  CN II through XII intact, motor grossly intact  EKG -underlying atrial rhythm is difficult to discern. He is ventricular pacing.  DEVICE  Normal device function.  See PaceArt for details. Device in the VVI mode   Assess/Plan:

## 2012-12-27 NOTE — Assessment & Plan Note (Signed)
The patient's ventricular rate is well controlled. He will continue his current medical therapy.

## 2012-12-27 NOTE — Assessment & Plan Note (Signed)
His blood pressure is well controlled. He'll continue his current medical therapy. 

## 2012-12-30 ENCOUNTER — Encounter: Payer: Medicare PPO | Admitting: Internal Medicine

## 2013-01-05 NOTE — Progress Notes (Signed)
Need orders in EPIC.  Surgery scheduled for 02/15/13.  Preop on 01/20/13 at 100pm.

## 2013-01-06 ENCOUNTER — Telehealth: Payer: Self-pay | Admitting: Interventional Cardiology

## 2013-01-06 NOTE — Telephone Encounter (Signed)
Received request from Nurse fax box, documents faxed for surgical clearance. To: [facility] Fax number: [phone] Attention: 01/06/13/KM

## 2013-01-17 ENCOUNTER — Other Ambulatory Visit: Payer: Self-pay | Admitting: Internal Medicine

## 2013-01-20 ENCOUNTER — Encounter (HOSPITAL_COMMUNITY)
Admission: RE | Admit: 2013-01-20 | Discharge: 2013-01-20 | Disposition: A | Discharge status: Home / Self Care | Payer: Medicare PPO | Source: Ambulatory Visit | Attending: Orthopedic Surgery | Admitting: Orthopedic Surgery

## 2013-01-20 ENCOUNTER — Encounter (HOSPITAL_COMMUNITY): Payer: Self-pay | Admitting: Pharmacy Technician

## 2013-01-20 ENCOUNTER — Encounter (HOSPITAL_COMMUNITY): Payer: Self-pay

## 2013-01-20 ENCOUNTER — Ambulatory Visit (HOSPITAL_COMMUNITY)
Admission: RE | Admit: 2013-01-20 | Discharge: 2013-01-20 | Disposition: A | Discharge status: Home / Self Care | Payer: Medicare PPO | Source: Ambulatory Visit | Attending: Orthopedic Surgery | Admitting: Orthopedic Surgery

## 2013-01-20 DIAGNOSIS — Z950 Presence of cardiac pacemaker: Secondary | ICD-10-CM | POA: Insufficient documentation

## 2013-01-20 DIAGNOSIS — I10 Essential (primary) hypertension: Secondary | ICD-10-CM | POA: Insufficient documentation

## 2013-01-20 DIAGNOSIS — Z951 Presence of aortocoronary bypass graft: Secondary | ICD-10-CM | POA: Insufficient documentation

## 2013-01-20 DIAGNOSIS — Z01818 Encounter for other preprocedural examination: Secondary | ICD-10-CM | POA: Insufficient documentation

## 2013-01-20 DIAGNOSIS — I517 Cardiomegaly: Secondary | ICD-10-CM | POA: Insufficient documentation

## 2013-01-20 DIAGNOSIS — Z01812 Encounter for preprocedural laboratory examination: Secondary | ICD-10-CM | POA: Insufficient documentation

## 2013-01-20 DIAGNOSIS — Z954 Presence of other heart-valve replacement: Secondary | ICD-10-CM | POA: Insufficient documentation

## 2013-01-20 DIAGNOSIS — I4891 Unspecified atrial fibrillation: Secondary | ICD-10-CM | POA: Insufficient documentation

## 2013-01-20 HISTORY — DX: Presence of cardiac pacemaker: Z95.0

## 2013-01-20 HISTORY — DX: Anemia, unspecified: D64.9

## 2013-01-20 HISTORY — DX: Unspecified osteoarthritis, unspecified site: M19.90

## 2013-01-20 HISTORY — DX: Pain, unspecified: R52

## 2013-01-20 LAB — URINALYSIS, ROUTINE W REFLEX MICROSCOPIC
Bilirubin Urine: NEGATIVE
Glucose, UA: NEGATIVE mg/dL
Ketones, ur: NEGATIVE mg/dL
Leukocytes, UA: NEGATIVE
Nitrite: NEGATIVE
Specific Gravity, Urine: 1.018 (ref 1.005–1.030)
Urobilinogen, UA: 1 mg/dL (ref 0.0–1.0)
pH: 5.5 (ref 5.0–8.0)

## 2013-01-20 LAB — CBC
HCT: 35.8 % — ABNORMAL LOW (ref 39.0–52.0)
Hemoglobin: 11.5 g/dL — ABNORMAL LOW (ref 13.0–17.0)
MCHC: 32.1 g/dL (ref 30.0–36.0)
RBC: 3.83 MIL/uL — ABNORMAL LOW (ref 4.22–5.81)
WBC: 6.4 10*3/uL (ref 4.0–10.5)

## 2013-01-20 LAB — BASIC METABOLIC PANEL
BUN: 26 mg/dL — ABNORMAL HIGH (ref 6–23)
CO2: 26 mEq/L (ref 19–32)
Calcium: 9.2 mg/dL (ref 8.4–10.5)
Chloride: 104 mEq/L (ref 96–112)
Creatinine, Ser: 1.72 mg/dL — ABNORMAL HIGH (ref 0.50–1.35)
GFR calc non Af Amer: 34 mL/min — ABNORMAL LOW (ref >90)
Glucose, Bld: 79 mg/dL (ref 70–99)
Potassium: 5.5 mEq/L — ABNORMAL HIGH (ref 3.5–5.1)
Sodium: 137 mEq/L (ref 135–145)

## 2013-01-20 LAB — APTT: aPTT: 36 seconds (ref 24–37)

## 2013-01-20 LAB — PROTIME-INR: INR: 1.67 — ABNORMAL HIGH (ref 0.00–1.49)

## 2013-01-20 LAB — SURGICAL PCR SCREEN
MRSA, PCR: NEGATIVE
Staphylococcus aureus: NEGATIVE

## 2013-01-20 NOTE — Pre-Procedure Instructions (Signed)
PT HAS EKG REPORT 08/27/12 IN EPIC AND CARDIOLOGY OFFICE NOTE IN EPIC DR. Rosette Reveal FROM 12/25/12. CXR WAS REPEATED TODAY - AS PER ANESTHESIOLOGIST'S GUIDELINES. PERIOPERATIVE PRESCRIPTION FOR IMPLANTED CARDIAC DEVICE PROGRAMMING ON PT'S CHART FROM DR. TAYLOR - PT HAS PACEMAKER - ORDERS STATE PROCEDURE SHOULD NOT INTERFER WITH DEVICE FUNCTION NO DEVICE REPROGRAMMING OR MAGNET PLACEMENT NEEDED.

## 2013-01-20 NOTE — Patient Instructions (Addendum)
YOUR SURGERY IS SCHEDULED AT [facility]  ON: Monday  12/ 8  REPORT TO  SHORT STAY CENTER AT:  5:30 AM      PHONE # FOR SHORT STAY IS [phone]  DO NOT EAT OR DRINK ANYTHING AFTER MIDNIGHT THE NIGHT BEFORE YOUR SURGERY.  YOU MAY BRUSH YOUR TEETH, RINSE OUT YOUR MOUTH--BUT NO WATER, NO FOOD, NO CHEWING GUM, NO MINTS, NO CANDIES, NO CHEWING TOBACCO.  PLEASE TAKE THE FOLLOWING MEDICATIONS THE AM OF YOUR SURGERY WITH A FEW SIPS OF WATER:    GABAPENTIN, HYDROCODONE / ACETAMINOPHEN FOR PAIN IF NEEDED, METOPROLOL, PRILOSEC, PREDNISONE.    DO NOT BRING VALUABLES, MONEY, CREDIT CARDS.  DO NOT WEAR JEWELRY, MAKE-UP, NAIL POLISH AND NO METAL PINS OR CLIPS IN YOUR HAIR. CONTACT LENS, DENTURES / PARTIALS, GLASSES SHOULD NOT BE WORN TO SURGERY AND IN MOST CASES-HEARING AIDS WILL NEED TO BE REMOVED.  BRING YOUR GLASSES CASE, ANY EQUIPMENT NEEDED FOR YOUR CONTACT LENS. FOR PATIENTS ADMITTED TO THE HOSPITAL--CHECK OUT TIME THE DAY OF DISCHARGE IS 11:00 AM.  ALL INPATIENT ROOMS ARE PRIVATE - WITH BATHROOM, TELEPHONE, TELEVISION AND WIFI INTERNET.                                                     PLEASE READ OVER ANY  FACT SHEETS THAT YOU WERE GIVEN: MRSA INFORMATION, BLOOD TRANSFUSION INFORMATION, INCENTIVE SPIROMETER INFORMATION.  FAILURE TO FOLLOW THESE INSTRUCTIONS MAY RESULT IN THE CANCELLATION OF YOUR SURGERY. PLEASE BE AWARE THAT YOU MAY NEED ADDITIONAL BLOOD DRAWN DAY OF YOUR SURGERY  PATIENT SIGNATURE_________________________________

## 2013-01-20 NOTE — Pre-Procedure Instructions (Signed)
PT'S PREOP BMET AND CBC REPORTS FAXED TO DR. Nilsa Nutting OFFICE FOR REVIEW OF ABNORMALS. PREOP PT, INR ABNORMAL - BUT PT STILL ON COUMADIN - ORDER IN EPIC TO REPEAT PT, INR ON PT'S ARRIVAL FOR SURGERY - AND T/S WILL ALSO BE DONE AT THAT TIME.

## 2013-01-20 NOTE — H&P (Signed)
TOTAL KNEE ADMISSION H&P  Patient is being admitted for left total knee arthroplasty.  Subjective:  Chief Complaint:   Left knee OA / pain.  HPI: ROMELL WOLDEN, 79 y.o. male, has a history of pain and functional disability in the left knee due to arthritis and has failed non-surgical conservative treatments for greater than 12 weeks to includeNSAID's and/or analgesics, use of assistive devices and activity modification.  Onset of symptoms was gradual, starting 5+ years ago with gradually worsening course since that time. The patient noted prior procedures on the knee to include  arthroscopy on the left knee(s).  Patient currently rates pain in the left knee(s) at 9 out of 10 with activity. Patient has worsening of pain with activity and weight bearing, pain that interferes with activities of daily living, pain with passive range of motion, crepitus and joint swelling.  Patient has evidence of periarticular osteophytes and joint space narrowing by imaging studies. There is no active infection.  Risks, benefits and expectations were discussed with the patient.  Risks including but not limited to the risk of anesthesia, blood clots, nerve damage, blood vessel damage, failure of the prosthesis, infection and up to and including death.  Patient understand the risks, benefits and expectations and wishes to proceed with surgery.   D/C Plans:   Home with HHPT (daughter's house)  Post-op Meds: No Rx given  Tranexamic Acid:   Not to be given - CAD  Decadron:    To be given  FYI:    Norco post-op  Coumadin with branching Lovenox   Patient Active Problem List   Diagnosis Date Noted  . Critical aortic valve stenosis 08/20/2012  . Abdominal aneurysm without mention of rupture 09/02/2011  . History of AAA (abdominal aortic aneurysm) repair 06/18/2011  . Anemia 06/18/2011  . GI bleed 06/18/2011  . Back pain, chronic 06/18/2011  . Arthritis 06/18/2011  . Chronic a-fib on Coumadin 06/18/2011  .  CAD (coronary artery disease) s/p CABG 06/18/2011  . HTN (hypertension) 06/18/2011   Past Medical History  Diagnosis Date  . Hypertension   . Hyperlipidemia   . CAD (coronary artery disease)     a. 05/2004 CABGx4 (LIMA->LAD, VG->RI, VG->OM, VG->RCA);  b. 08/2012 Cath: Native 3VD with 3/4 patent grafts (VG->RI occluded).  . Atrial fibrillation   . Macular degeneration   . Aortic stenosis     a. 08/2012 TEE: EF 45-55%, critical AS, Triv AI, mild to mod MR, Sev TR;  b. 08/2012 AVR (21mm Magna Ease pericardial tissue valve) & TV Repair (30mm MC3 annuloplasty ring).  Marland Kitchen AAA (abdominal aortic aneurysm)     a. 06/2010 s/p Endovascular AAA Repair  . 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 Pain     PT HAS A LOT OF LOWER BACK PAIN- PREVIOUS BACK SURGERY AND DJD  . Pacemaker   . Shortness of breath     MOSTLY WITH EXERTION - IMPROVED SINCE HEART VALVE REPLACEMENT  . Arthritis     LEFT KNEE OA AND PAIN;  DJD LUMBAR  . Anemia requiring transfusions     MOST RECENT WAS JUNE 2014 WITH HEART SURGERY  . Complication of anesthesia     STATES HE DOES WELL WITH DIPROVAN    Past Surgical History  Procedure Laterality Date  . Endovascular stent insertion  07/05/2010  . Appendectomy    . Coronary artery bypass graft    . Vascular surgery    .  Back surgery    . Cardiac catheterization    . Esophagogastroduodenoscopy  06/19/2011    Procedure: ESOPHAGOGASTRODUODENOSCOPY (EGD);  Surgeon: Graylin Shiver, MD;  Location: [facility] OR;  Service: Gastroenterology;  Laterality: N/A;  . Colonoscopy  06/19/2011    Procedure: COLONOSCOPY;  Surgeon: Graylin Shiver, MD;  Location: [facility] OR;  Service: Gastroenterology;  Laterality: N/A;  . Eye surgery      bilater cataracts removed  . Aortic valve replacement N/A 09/01/2012    Procedure: REDO AORTIC VALVE REPLACEMENT (AVR);  Surgeon: Kerin Perna, MD;  Location: [facility] OR;  Service: Open Heart Surgery;  Laterality: N/A;  . Tricuspid  valve replacement N/A 09/01/2012    Procedure: TRICUSPID VALVE REPAIR;  Surgeon: Kerin Perna, MD;  Location: [facility] OR;  Service: Open Heart Surgery;  Laterality: N/A;  . Intraoperative transesophageal echocardiogram N/A 09/01/2012    Procedure: INTRAOPERATIVE TRANSESOPHAGEAL ECHOCARDIOGRAM;  Surgeon: Kerin Perna, MD;  Location: [facility] OR;  Service: Open Heart Surgery;  Laterality: N/A;  . Pacemaker insertion  09/07/2012    Medtronic Sensia single chamber pacemaker implanted by Dr Ladona Ridgel   . Surgery about 50 yrs ago for perforated ulcer and repair of hiatal hernia      No prescriptions prior to admission   No Known Allergies   History  Substance Use Topics  . Smoking status: Former Smoker    Quit date: 10/29/1975  . Smokeless tobacco: Never Used  . Alcohol Use: No    Family History  Problem Relation Age of Onset  . Coronary artery disease Brother      Review of Systems  Constitutional: Negative.   HENT: Negative.   Eyes: Negative.   Respiratory: Negative.   Cardiovascular: Negative.   Gastrointestinal: Negative.   Genitourinary: Positive for frequency.  Musculoskeletal: Positive for joint pain.  Skin: Negative.   Neurological: Negative.   Endo/Heme/Allergies: Negative.   Psychiatric/Behavioral: Negative.     Objective:  Physical Exam  Constitutional: He is oriented to person, place, and time. He appears well-developed and well-nourished.  HENT:  Head: Normocephalic and atraumatic.  Mouth/Throat: Oropharynx is clear and moist.  Eyes: Pupils are equal, round, and reactive to light.  Neck: No JVD present. No tracheal deviation present. No thyromegaly present.  Cardiovascular: Normal rate, regular rhythm, normal heart sounds and intact distal pulses.   Respiratory: Effort normal and breath sounds normal. No stridor. No respiratory distress. He has no wheezes.  GI: Soft. There is no tenderness. There is no guarding.  Musculoskeletal:       Left knee: He exhibits decreased  range of motion, swelling and bony tenderness. He exhibits no effusion, no ecchymosis, no deformity, no laceration, no erythema and normal alignment. Tenderness found.  Lymphadenopathy:    He has no cervical adenopathy.  Neurological: He is alert and oriented to person, place, and time.  Skin: Skin is warm and dry.  Psychiatric: He has a normal mood and affect.    Vital signs in last 24 hours: Temp:  [97.4 F (36.3 C)] 97.4 F (36.3 C) (11/12 1315) Pulse Rate:  [84] 84 (11/12 1315) Resp:  [16] 16 (11/12 1315) BP: (110)/(70) 110/70 mmHg (11/12 1315) SpO2:  [97 %] 97 % (11/12 1315) Weight:  [73.029 kg (161 lb)] 73.029 kg (161 lb) (11/12 1315)  Labs:  Estimated body mass index is 24.12 kg/(m^2) as calculated from the following:   Height as of 12/25/12: 5\' 9"  (1.753 m).   Weight as of 12/25/12: 74.118 kg (163 lb  6.4 oz).   Imaging Review Plain radiographs demonstrate severe degenerative joint disease of the left knee(s). The overall alignment isneutral. The bone quality appears to be good for age and reported activity level.  Assessment/Plan:  End stage arthritis, left knee   The patient history, physical examination, clinical judgment of the provider and imaging studies are consistent with end stage degenerative joint disease of the left knee(s) and total knee arthroplasty is deemed medically necessary. The treatment options including medical management, injection therapy arthroscopy and arthroplasty were discussed at length. The risks and benefits of total knee arthroplasty were presented and reviewed. The risks due to aseptic loosening, infection, stiffness, patella tracking problems, thromboembolic complications and other imponderables were discussed. The patient acknowledged the explanation, agreed to proceed with the plan and consent was signed. Patient is being admitted for inpatient treatment for surgery, pain control, PT, OT, prophylactic antibiotics, VTE prophylaxis, progressive  ambulation and ADL's and discharge planning. The patient is planning to be discharged home with home health services.     Anastasio Auerbach Britanee Vanblarcom   PAC  01/20/2013, 6:32 PM

## 2013-02-03 ENCOUNTER — Ambulatory Visit: Payer: Medicare PPO | Admitting: Interventional Cardiology

## 2013-02-12 ENCOUNTER — Ambulatory Visit: Payer: Medicare PPO | Admitting: Interventional Cardiology

## 2013-02-15 ENCOUNTER — Encounter (HOSPITAL_COMMUNITY): Payer: Self-pay | Admitting: *Deleted

## 2013-02-15 ENCOUNTER — Encounter (HOSPITAL_COMMUNITY): Payer: Medicare PPO | Admitting: Anesthesiology

## 2013-02-15 ENCOUNTER — Inpatient Hospital Stay (HOSPITAL_COMMUNITY): Payer: Medicare PPO | Admitting: Anesthesiology

## 2013-02-15 ENCOUNTER — Inpatient Hospital Stay (HOSPITAL_COMMUNITY)
Admission: RE | Admit: 2013-02-15 | Discharge: 2013-02-18 | DRG: 470 | Disposition: A | Discharge status: Home Health | Payer: Medicare PPO | Source: Ambulatory Visit | Attending: Orthopedic Surgery | Admitting: Orthopedic Surgery

## 2013-02-15 ENCOUNTER — Encounter (HOSPITAL_COMMUNITY)
Admission: RE | Disposition: A | Discharge status: Home Health | Payer: Self-pay | Source: Ambulatory Visit | Attending: Orthopedic Surgery

## 2013-02-15 DIAGNOSIS — G8929 Other chronic pain: Secondary | ICD-10-CM | POA: Diagnosis present

## 2013-02-15 DIAGNOSIS — M1710 Unilateral primary osteoarthritis, unspecified knee: Principal | ICD-10-CM | POA: Diagnosis present

## 2013-02-15 DIAGNOSIS — M545 Low back pain, unspecified: Secondary | ICD-10-CM | POA: Diagnosis present

## 2013-02-15 DIAGNOSIS — Z951 Presence of aortocoronary bypass graft: Secondary | ICD-10-CM

## 2013-02-15 DIAGNOSIS — I4891 Unspecified atrial fibrillation: Secondary | ICD-10-CM | POA: Diagnosis present

## 2013-02-15 DIAGNOSIS — Z950 Presence of cardiac pacemaker: Secondary | ICD-10-CM

## 2013-02-15 DIAGNOSIS — I714 Abdominal aortic aneurysm, without rupture, unspecified: Secondary | ICD-10-CM | POA: Diagnosis present

## 2013-02-15 DIAGNOSIS — M6580 Other synovitis and tenosynovitis, unspecified site: Secondary | ICD-10-CM | POA: Diagnosis present

## 2013-02-15 DIAGNOSIS — I359 Nonrheumatic aortic valve disorder, unspecified: Secondary | ICD-10-CM | POA: Diagnosis present

## 2013-02-15 DIAGNOSIS — Z87891 Personal history of nicotine dependence: Secondary | ICD-10-CM

## 2013-02-15 DIAGNOSIS — D62 Acute posthemorrhagic anemia: Secondary | ICD-10-CM | POA: Diagnosis not present

## 2013-02-15 DIAGNOSIS — Z96652 Presence of left artificial knee joint: Secondary | ICD-10-CM

## 2013-02-15 DIAGNOSIS — E785 Hyperlipidemia, unspecified: Secondary | ICD-10-CM | POA: Diagnosis present

## 2013-02-15 DIAGNOSIS — Z7901 Long term (current) use of anticoagulants: Secondary | ICD-10-CM

## 2013-02-15 DIAGNOSIS — H3530 Unspecified macular degeneration: Secondary | ICD-10-CM | POA: Diagnosis present

## 2013-02-15 DIAGNOSIS — Z8249 Family history of ischemic heart disease and other diseases of the circulatory system: Secondary | ICD-10-CM

## 2013-02-15 DIAGNOSIS — I10 Essential (primary) hypertension: Secondary | ICD-10-CM | POA: Diagnosis present

## 2013-02-15 DIAGNOSIS — Z96659 Presence of unspecified artificial knee joint: Secondary | ICD-10-CM

## 2013-02-15 DIAGNOSIS — M898X9 Other specified disorders of bone, unspecified site: Secondary | ICD-10-CM | POA: Diagnosis present

## 2013-02-15 DIAGNOSIS — Z954 Presence of other heart-valve replacement: Secondary | ICD-10-CM

## 2013-02-15 DIAGNOSIS — M25469 Effusion, unspecified knee: Secondary | ICD-10-CM | POA: Diagnosis present

## 2013-02-15 DIAGNOSIS — I2510 Atherosclerotic heart disease of native coronary artery without angina pectoris: Secondary | ICD-10-CM | POA: Diagnosis present

## 2013-02-15 HISTORY — PX: TOTAL KNEE ARTHROPLASTY: SHX125

## 2013-02-15 LAB — PROTIME-INR
INR: 1.06 (ref 0.00–1.49)
Prothrombin Time: 13.6 seconds (ref 11.6–15.2)

## 2013-02-15 SURGERY — ARTHROPLASTY, KNEE, TOTAL
Anesthesia: Spinal | Site: Knee | Laterality: Left

## 2013-02-15 MED ORDER — PROPOFOL 10 MG/ML IV BOLUS
INTRAVENOUS | Status: AC
  Filled 2013-02-15: qty 20

## 2013-02-15 MED ORDER — ONDANSETRON HCL 4 MG/2ML IJ SOLN
INTRAMUSCULAR | Status: DC | PRN
  Administered 2013-02-15: 4 mg via INTRAVENOUS

## 2013-02-15 MED ORDER — VITAMIN B-1 100 MG PO TABS
100.0000 mg | ORAL_TABLET | Freq: Every day | ORAL | Status: DC
  Administered 2013-02-15 – 2013-02-18 (×4): 100 mg via ORAL
  Filled 2013-02-15 (×4): qty 1

## 2013-02-15 MED ORDER — SODIUM CHLORIDE 0.9 % IJ SOLN
INTRAMUSCULAR | Status: AC
  Filled 2013-02-15: qty 50

## 2013-02-15 MED ORDER — PREDNISONE 5 MG PO TABS
5.0000 mg | ORAL_TABLET | Freq: Every day | ORAL | Status: DC
  Administered 2013-02-16 – 2013-02-18 (×3): 5 mg via ORAL
  Filled 2013-02-15 (×4): qty 1

## 2013-02-15 MED ORDER — METOCLOPRAMIDE HCL 5 MG/ML IJ SOLN
INTRAMUSCULAR | Status: AC
  Filled 2013-02-15: qty 2

## 2013-02-15 MED ORDER — ENOXAPARIN SODIUM 40 MG/0.4ML ~~LOC~~ SOLN
40.0000 mg | SUBCUTANEOUS | Status: DC
  Administered 2013-02-16: 07:00:00 40 mg via SUBCUTANEOUS
  Filled 2013-02-15 (×2): qty 0.4

## 2013-02-15 MED ORDER — ONDANSETRON HCL 4 MG/2ML IJ SOLN: 4.0000 mg | Freq: Four times a day (QID) | INTRAMUSCULAR | Status: DC | PRN

## 2013-02-15 MED ORDER — METOCLOPRAMIDE HCL 5 MG/ML IJ SOLN
INTRAMUSCULAR | Status: DC | PRN
  Administered 2013-02-15: 10 mg via INTRAVENOUS

## 2013-02-15 MED ORDER — HYDROCODONE-ACETAMINOPHEN 10-325 MG PO TABS
1.0000 | ORAL_TABLET | ORAL | Status: DC
  Administered 2013-02-15: 2 via ORAL
  Administered 2013-02-15 – 2013-02-16 (×6): 1 via ORAL
  Administered 2013-02-16 (×2): 2 via ORAL
  Administered 2013-02-16: 16:00:00 1 via ORAL
  Administered 2013-02-17: 2 via ORAL
  Administered 2013-02-17: 1 via ORAL
  Administered 2013-02-17 (×2): 2 via ORAL
  Administered 2013-02-17: 22:00:00 1 via ORAL
  Administered 2013-02-18 (×2): 2 via ORAL
  Filled 2013-02-15 (×2): qty 2
  Filled 2013-02-15 (×2): qty 1
  Filled 2013-02-15: qty 2
  Filled 2013-02-15: qty 1
  Filled 2013-02-15: qty 2
  Filled 2013-02-15: qty 1
  Filled 2013-02-15 (×4): qty 2
  Filled 2013-02-15 (×2): qty 1
  Filled 2013-02-15 (×2): qty 2

## 2013-02-15 MED ORDER — SIMVASTATIN 20 MG PO TABS
20.0000 mg | ORAL_TABLET | Freq: Every day | ORAL | Status: DC
  Administered 2013-02-15 – 2013-02-17 (×3): 20 mg via ORAL
  Filled 2013-02-15 (×4): qty 1

## 2013-02-15 MED ORDER — BUPIVACAINE-EPINEPHRINE (PF) 0.25% -1:200000 IJ SOLN
INTRAMUSCULAR | Status: DC | PRN
  Administered 2013-02-15: 25 mL

## 2013-02-15 MED ORDER — MENTHOL 3 MG MT LOZG
1.0000 | LOZENGE | OROMUCOSAL | Status: DC | PRN
  Filled 2013-02-15: qty 9

## 2013-02-15 MED ORDER — PHENYLEPHRINE HCL 10 MG/ML IJ SOLN
INTRAMUSCULAR | Status: DC | PRN
  Administered 2013-02-15: 160 ug via INTRAVENOUS
  Administered 2013-02-15: 80 ug via INTRAVENOUS

## 2013-02-15 MED ORDER — FOLIC ACID 1 MG PO TABS
1.0000 mg | ORAL_TABLET | Freq: Every day | ORAL | Status: DC
  Administered 2013-02-15 – 2013-02-18 (×4): 1 mg via ORAL
  Filled 2013-02-15 (×4): qty 1

## 2013-02-15 MED ORDER — FUROSEMIDE 20 MG PO TABS
20.0000 mg | ORAL_TABLET | Freq: Every day | ORAL | Status: DC | PRN
  Filled 2013-02-15: qty 1

## 2013-02-15 MED ORDER — FLEET ENEMA 7-19 GM/118ML RE ENEM: 1.0000 | ENEMA | Freq: Once | RECTAL | Status: AC | PRN

## 2013-02-15 MED ORDER — PROPOFOL INFUSION 10 MG/ML OPTIME
INTRAVENOUS | Status: DC | PRN
  Administered 2013-02-15: 100 ug/kg/min via INTRAVENOUS

## 2013-02-15 MED ORDER — DEXAMETHASONE SODIUM PHOSPHATE 10 MG/ML IJ SOLN
INTRAMUSCULAR | Status: AC
  Filled 2013-02-15: qty 1

## 2013-02-15 MED ORDER — ALUM & MAG HYDROXIDE-SIMETH 200-200-20 MG/5ML PO SUSP: 30.0000 mL | ORAL | Status: DC | PRN

## 2013-02-15 MED ORDER — PHENYLEPHRINE HCL 10 MG/ML IJ SOLN
20.0000 mg | INTRAVENOUS | Status: DC | PRN
  Administered 2013-02-15: 50 ug/min via INTRAVENOUS

## 2013-02-15 MED ORDER — METOCLOPRAMIDE HCL 5 MG/ML IJ SOLN: 5.0000 mg | Freq: Three times a day (TID) | INTRAMUSCULAR | Status: DC | PRN

## 2013-02-15 MED ORDER — FERROUS SULFATE 325 (65 FE) MG PO TABS
325.0000 mg | ORAL_TABLET | Freq: Three times a day (TID) | ORAL | Status: DC
  Administered 2013-02-15 – 2013-02-18 (×8): 325 mg via ORAL
  Filled 2013-02-15 (×12): qty 1

## 2013-02-15 MED ORDER — BUPIVACAINE-EPINEPHRINE PF 0.25-1:200000 % IJ SOLN
INTRAMUSCULAR | Status: AC
  Filled 2013-02-15: qty 30

## 2013-02-15 MED ORDER — MIDAZOLAM HCL 2 MG/2ML IJ SOLN
INTRAMUSCULAR | Status: AC
  Filled 2013-02-15: qty 2

## 2013-02-15 MED ORDER — PHENYLEPHRINE HCL 10 MG/ML IJ SOLN
INTRAMUSCULAR | Status: AC
  Filled 2013-02-15: qty 2

## 2013-02-15 MED ORDER — SPIRONOLACTONE 25 MG PO TABS
25.0000 mg | ORAL_TABLET | Freq: Every day | ORAL | Status: DC
  Administered 2013-02-15 – 2013-02-18 (×4): 25 mg via ORAL
  Filled 2013-02-15 (×4): qty 1

## 2013-02-15 MED ORDER — EPHEDRINE SULFATE 50 MG/ML IJ SOLN
INTRAMUSCULAR | Status: DC | PRN
  Administered 2013-02-15: 5 mg via INTRAVENOUS
  Administered 2013-02-15: 10 mg via INTRAVENOUS
  Administered 2013-02-15 (×2): 5 mg via INTRAVENOUS

## 2013-02-15 MED ORDER — DEXAMETHASONE SODIUM PHOSPHATE 10 MG/ML IJ SOLN: 10.0000 mg | Freq: Once | INTRAMUSCULAR | Status: DC

## 2013-02-15 MED ORDER — KETOROLAC TROMETHAMINE 30 MG/ML IJ SOLN
INTRAMUSCULAR | Status: AC
  Filled 2013-02-15: qty 1

## 2013-02-15 MED ORDER — CHLORHEXIDINE GLUCONATE 4 % EX LIQD: 60.0000 mL | Freq: Once | CUTANEOUS | Status: DC

## 2013-02-15 MED ORDER — DOCUSATE SODIUM 100 MG PO CAPS
100.0000 mg | ORAL_CAPSULE | Freq: Two times a day (BID) | ORAL | Status: DC
  Administered 2013-02-15 – 2013-02-18 (×6): 100 mg via ORAL

## 2013-02-15 MED ORDER — PROPOFOL 10 MG/ML IV BOLUS
INTRAVENOUS | Status: DC | PRN
  Administered 2013-02-15: 30 mg via INTRAVENOUS

## 2013-02-15 MED ORDER — HYDROMORPHONE HCL PF 1 MG/ML IJ SOLN
0.5000 mg | INTRAMUSCULAR | Status: DC | PRN
  Administered 2013-02-15: 11:00:00 1 mg via INTRAVENOUS
  Filled 2013-02-15: qty 1

## 2013-02-15 MED ORDER — KETOROLAC TROMETHAMINE 30 MG/ML IJ SOLN
INTRAMUSCULAR | Status: DC | PRN
  Administered 2013-02-15: 30 mg

## 2013-02-15 MED ORDER — FENTANYL CITRATE 0.05 MG/ML IJ SOLN
INTRAMUSCULAR | Status: AC
  Filled 2013-02-15: qty 5

## 2013-02-15 MED ORDER — SODIUM CHLORIDE 0.9 % IR SOLN
Status: DC | PRN
  Administered 2013-02-15: 1000 mL

## 2013-02-15 MED ORDER — METOPROLOL TARTRATE 25 MG PO TABS
25.0000 mg | ORAL_TABLET | Freq: Every morning | ORAL | Status: DC
  Administered 2013-02-16 – 2013-02-18 (×3): 25 mg via ORAL
  Filled 2013-02-15 (×3): qty 1

## 2013-02-15 MED ORDER — LACTATED RINGERS IV SOLN
INTRAVENOUS | Status: DC | PRN
  Administered 2013-02-15 (×2): via INTRAVENOUS

## 2013-02-15 MED ORDER — GLYCOPYRROLATE 0.2 MG/ML IJ SOLN
INTRAMUSCULAR | Status: AC
  Filled 2013-02-15: qty 1

## 2013-02-15 MED ORDER — EPHEDRINE SULFATE 50 MG/ML IJ SOLN
INTRAMUSCULAR | Status: AC
  Filled 2013-02-15: qty 1

## 2013-02-15 MED ORDER — PHENOL 1.4 % MT LIQD
1.0000 | OROMUCOSAL | Status: DC | PRN
  Filled 2013-02-15: qty 177

## 2013-02-15 MED ORDER — ENOXAPARIN SODIUM 40 MG/0.4ML ~~LOC~~ SOLN
40.0000 mg | Freq: Two times a day (BID) | SUBCUTANEOUS | Status: DC
  Filled 2013-02-15: qty 0.4

## 2013-02-15 MED ORDER — LIDOCAINE HCL 2 % EX GEL
CUTANEOUS | Status: AC
  Filled 2013-02-15: qty 10

## 2013-02-15 MED ORDER — STERILE WATER FOR INJECTION IJ SOLN
INTRAMUSCULAR | Status: AC
  Filled 2013-02-15: qty 10

## 2013-02-15 MED ORDER — WARFARIN SODIUM 7.5 MG PO TABS
7.5000 mg | ORAL_TABLET | Freq: Once | ORAL | Status: AC
  Administered 2013-02-15: 7.5 mg via ORAL
  Filled 2013-02-15: qty 1

## 2013-02-15 MED ORDER — LIDOCAINE HCL 2 % EX GEL
CUTANEOUS | Status: DC | PRN
  Administered 2013-02-15: 1

## 2013-02-15 MED ORDER — POLYETHYLENE GLYCOL 3350 17 G PO PACK
17.0000 g | PACK | Freq: Two times a day (BID) | ORAL | Status: DC
  Administered 2013-02-15 – 2013-02-18 (×6): 17 g via ORAL

## 2013-02-15 MED ORDER — BUPIVACAINE LIPOSOME 1.3 % IJ SUSP
20.0000 mL | Freq: Once | INTRAMUSCULAR | Status: DC
  Filled 2013-02-15: qty 20

## 2013-02-15 MED ORDER — GABAPENTIN 300 MG PO CAPS
300.0000 mg | ORAL_CAPSULE | Freq: Two times a day (BID) | ORAL | Status: DC
  Administered 2013-02-15 – 2013-02-18 (×6): 300 mg via ORAL
  Filled 2013-02-15 (×7): qty 1

## 2013-02-15 MED ORDER — PHENYLEPHRINE HCL 10 MG/ML IJ SOLN
INTRAMUSCULAR | Status: AC
  Filled 2013-02-15: qty 1

## 2013-02-15 MED ORDER — METOCLOPRAMIDE HCL 10 MG PO TABS: 5.0000 mg | ORAL_TABLET | Freq: Three times a day (TID) | ORAL | Status: DC | PRN

## 2013-02-15 MED ORDER — CEFAZOLIN SODIUM-DEXTROSE 2-3 GM-% IV SOLR
2.0000 g | INTRAVENOUS | Status: AC
  Administered 2013-02-15: 2 g via INTRAVENOUS

## 2013-02-15 MED ORDER — BUPIVACAINE HCL (PF) 0.5 % IJ SOLN
INTRAMUSCULAR | Status: AC
  Filled 2013-02-15: qty 30

## 2013-02-15 MED ORDER — BUPIVACAINE IN DEXTROSE 0.75-8.25 % IT SOLN
INTRATHECAL | Status: DC | PRN
  Administered 2013-02-15: 1.6 mL via INTRATHECAL

## 2013-02-15 MED ORDER — ROPINIROLE HCL 1 MG PO TABS
2.0000 mg | ORAL_TABLET | Freq: Every day | ORAL | Status: DC
  Administered 2013-02-15 – 2013-02-17 (×3): 2 mg via ORAL
  Filled 2013-02-15 (×4): qty 2

## 2013-02-15 MED ORDER — SODIUM CHLORIDE 0.9 % IV SOLN
INTRAVENOUS | Status: DC
  Administered 2013-02-16 (×2): via INTRAVENOUS
  Filled 2013-02-15 (×14): qty 1000

## 2013-02-15 MED ORDER — HYDROCORTISONE 1 % EX CREA
1.0000 "application " | TOPICAL_CREAM | Freq: Two times a day (BID) | CUTANEOUS | Status: DC | PRN
  Filled 2013-02-15: qty 28

## 2013-02-15 MED ORDER — WARFARIN - PHARMACIST DOSING INPATIENT: Freq: Every day | Status: DC

## 2013-02-15 MED ORDER — DIPHENHYDRAMINE HCL 25 MG PO CAPS: 25.0000 mg | ORAL_CAPSULE | Freq: Four times a day (QID) | ORAL | Status: DC | PRN

## 2013-02-15 MED ORDER — DEXAMETHASONE SODIUM PHOSPHATE 10 MG/ML IJ SOLN
INTRAMUSCULAR | Status: DC | PRN
  Administered 2013-02-15: 10 mg via INTRAVENOUS

## 2013-02-15 MED ORDER — KETAMINE HCL 50 MG/ML IJ SOLN
INTRAMUSCULAR | Status: DC | PRN
  Administered 2013-02-15 (×7): 5 mg via INTRAMUSCULAR

## 2013-02-15 MED ORDER — CEFAZOLIN SODIUM-DEXTROSE 2-3 GM-% IV SOLR
2.0000 g | Freq: Four times a day (QID) | INTRAVENOUS | Status: AC
  Administered 2013-02-15 (×2): 2 g via INTRAVENOUS
  Filled 2013-02-15 (×2): qty 50

## 2013-02-15 MED ORDER — CEFAZOLIN SODIUM-DEXTROSE 2-3 GM-% IV SOLR
INTRAVENOUS | Status: AC
  Filled 2013-02-15: qty 50

## 2013-02-15 MED ORDER — ONDANSETRON HCL 4 MG PO TABS: 4.0000 mg | ORAL_TABLET | Freq: Four times a day (QID) | ORAL | Status: DC | PRN

## 2013-02-15 MED ORDER — KETAMINE HCL 50 MG/ML IJ SOLN
INTRAMUSCULAR | Status: AC
  Filled 2013-02-15: qty 10

## 2013-02-15 MED ORDER — BISACODYL 10 MG RE SUPP: 10.0000 mg | Freq: Every day | RECTAL | Status: DC | PRN

## 2013-02-15 MED ORDER — SODIUM CHLORIDE 0.9 % IJ SOLN
INTRAMUSCULAR | Status: DC | PRN
  Administered 2013-02-15: 09:00:00

## 2013-02-15 MED ORDER — CELECOXIB 200 MG PO CAPS
200.0000 mg | ORAL_CAPSULE | Freq: Two times a day (BID) | ORAL | Status: DC
  Administered 2013-02-15 – 2013-02-16 (×3): 200 mg via ORAL
  Filled 2013-02-15 (×4): qty 1

## 2013-02-15 MED ORDER — ONDANSETRON HCL 4 MG/2ML IJ SOLN
INTRAMUSCULAR | Status: AC
  Filled 2013-02-15: qty 2

## 2013-02-15 MED ORDER — METHOCARBAMOL 100 MG/ML IJ SOLN
500.0000 mg | Freq: Four times a day (QID) | INTRAMUSCULAR | Status: DC | PRN
  Administered 2013-02-15: 500 mg via INTRAVENOUS
  Filled 2013-02-15: qty 5

## 2013-02-15 MED ORDER — 0.9 % SODIUM CHLORIDE (POUR BTL) OPTIME
TOPICAL | Status: DC | PRN
  Administered 2013-02-15: 1000 mL

## 2013-02-15 MED ORDER — ZOLPIDEM TARTRATE 5 MG PO TABS: 5.0000 mg | ORAL_TABLET | Freq: Every evening | ORAL | Status: DC | PRN

## 2013-02-15 MED ORDER — METHOCARBAMOL 500 MG PO TABS
500.0000 mg | ORAL_TABLET | Freq: Four times a day (QID) | ORAL | Status: DC | PRN
  Administered 2013-02-15 – 2013-02-18 (×6): 500 mg via ORAL
  Filled 2013-02-15 (×6): qty 1

## 2013-02-15 MED ORDER — ENOXAPARIN SODIUM 30 MG/0.3ML ~~LOC~~ SOLN
30.0000 mg | Freq: Two times a day (BID) | SUBCUTANEOUS | Status: DC
  Filled 2013-02-15: qty 0.3

## 2013-02-15 MED ORDER — PANTOPRAZOLE SODIUM 40 MG PO TBEC
40.0000 mg | DELAYED_RELEASE_TABLET | Freq: Every day | ORAL | Status: DC
  Administered 2013-02-16 – 2013-02-18 (×3): 40 mg via ORAL
  Filled 2013-02-15 (×3): qty 1

## 2013-02-15 SURGICAL SUPPLY — 70 items
ADH SKN CLS APL DERMABOND .7 (GAUZE/BANDAGES/DRESSINGS) ×1
BAG SPEC THK2 15X12 ZIP CLS (MISCELLANEOUS) ×1
BAG ZIPLOCK 12X15 (MISCELLANEOUS) ×2 IMPLANT
BANDAGE ELASTIC 6 VELCRO ST LF (GAUZE/BANDAGES/DRESSINGS) ×2 IMPLANT
BANDAGE ESMARK 6X9 LF (GAUZE/BANDAGES/DRESSINGS) ×1 IMPLANT
BLADE SAW SGTL 13.0X1.19X90.0M (BLADE) ×2 IMPLANT
BNDG CMPR 9X6 STRL LF SNTH (GAUZE/BANDAGES/DRESSINGS) ×1
BNDG ESMARK 6X9 LF (GAUZE/BANDAGES/DRESSINGS) ×2
BOWL SMART MIX CTS (DISPOSABLE) ×2 IMPLANT
CAPT RP KNEE ×1 IMPLANT
CATH FOLEY 2WAY 5CC 16FR (CATHETERS) ×2
CATH URTH STD 16FR FL 2W DRN (CATHETERS) ×1 IMPLANT
CEMENT HV SMART SET (Cement) ×2 IMPLANT
CUFF TOURN SGL QUICK 34 (TOURNIQUET CUFF) ×2
CUFF TRNQT CYL 34X4X40X1 (TOURNIQUET CUFF) ×1 IMPLANT
DERMABOND ADVANCED (GAUZE/BANDAGES/DRESSINGS) ×1
DERMABOND ADVANCED .7 DNX12 (GAUZE/BANDAGES/DRESSINGS) ×1 IMPLANT
DRAPE EXTREMITY T 121X128X90 (DRAPE) ×2 IMPLANT
DRAPE POUCH INSTRU U-SHP 10X18 (DRAPES) ×2 IMPLANT
DRAPE U-SHAPE 47X51 STRL (DRAPES) ×2 IMPLANT
DRSG AQUACEL AG ADV 3.5X10 (GAUZE/BANDAGES/DRESSINGS) ×2 IMPLANT
DRSG TEGADERM 4X4.75 (GAUZE/BANDAGES/DRESSINGS) IMPLANT
DURAPREP 26ML APPLICATOR (WOUND CARE) ×4 IMPLANT
ELECT REM PT RETURN 9FT ADLT (ELECTROSURGICAL) ×2
ELECTRODE REM PT RTRN 9FT ADLT (ELECTROSURGICAL) ×1 IMPLANT
EVACUATOR 1/8 PVC DRAIN (DRAIN) IMPLANT
FACESHIELD LNG OPTICON STERILE (SAFETY) ×10 IMPLANT
GAUZE SPONGE 2X2 8PLY STRL LF (GAUZE/BANDAGES/DRESSINGS) IMPLANT
GLOVE BIO SURGEON STRL SZ8 (GLOVE) ×1 IMPLANT
GLOVE BIOGEL PI IND STRL 6.5 (GLOVE) IMPLANT
GLOVE BIOGEL PI IND STRL 7.0 (GLOVE) IMPLANT
GLOVE BIOGEL PI IND STRL 7.5 (GLOVE) ×1 IMPLANT
GLOVE BIOGEL PI IND STRL 8 (GLOVE) ×1 IMPLANT
GLOVE BIOGEL PI IND STRL 8.5 (GLOVE) IMPLANT
GLOVE BIOGEL PI INDICATOR 6.5 (GLOVE) ×2
GLOVE BIOGEL PI INDICATOR 7.0 (GLOVE) ×1
GLOVE BIOGEL PI INDICATOR 7.5 (GLOVE) ×1
GLOVE BIOGEL PI INDICATOR 8 (GLOVE) ×1
GLOVE BIOGEL PI INDICATOR 8.5 (GLOVE) ×1
GLOVE ECLIPSE 8.0 STRL XLNG CF (GLOVE) ×2 IMPLANT
GLOVE ORTHO TXT STRL SZ7.5 (GLOVE) ×4 IMPLANT
GOWN BRE IMP PREV XXLGXLNG (GOWN DISPOSABLE) ×2 IMPLANT
GOWN PREVENTION PLUS LG XLONG (DISPOSABLE) ×2 IMPLANT
GOWN STRL REIN XL XLG (GOWN DISPOSABLE) ×2 IMPLANT
HANDPIECE INTERPULSE COAX TIP (DISPOSABLE) ×2
KIT BASIN OR (CUSTOM PROCEDURE TRAY) ×2 IMPLANT
MANIFOLD NEPTUNE II (INSTRUMENTS) ×2 IMPLANT
NDL HYPO 21X1.5 SAFETY (NEEDLE) IMPLANT
NDL SAFETY ECLIPSE 18X1.5 (NEEDLE) ×2 IMPLANT
NEEDLE HYPO 18GX1.5 SHARP (NEEDLE) ×4
NEEDLE HYPO 21X1.5 SAFETY (NEEDLE) ×2 IMPLANT
NS IRRIG 1000ML POUR BTL (IV SOLUTION) ×2 IMPLANT
PACK TOTAL JOINT (CUSTOM PROCEDURE TRAY) ×2 IMPLANT
POSITIONER SURGICAL ARM (MISCELLANEOUS) ×2 IMPLANT
SET HNDPC FAN SPRY TIP SCT (DISPOSABLE) ×1 IMPLANT
SET PAD KNEE POSITIONER (MISCELLANEOUS) ×2 IMPLANT
SPONGE GAUZE 2X2 STER 10/PKG (GAUZE/BANDAGES/DRESSINGS)
SUCTION FRAZIER 12FR DISP (SUCTIONS) ×2 IMPLANT
SUT MNCRL AB 4-0 PS2 18 (SUTURE) ×2 IMPLANT
SUT VIC AB 1 CT1 36 (SUTURE) ×2 IMPLANT
SUT VIC AB 2-0 CT1 27 (SUTURE) ×6
SUT VIC AB 2-0 CT1 TAPERPNT 27 (SUTURE) ×3 IMPLANT
SUT VLOC 180 0 24IN GS25 (SUTURE) ×2 IMPLANT
SYR 20CC LL (SYRINGE) ×1 IMPLANT
SYR 3ML LL SCALE MARK (SYRINGE) ×2 IMPLANT
SYR 50ML LL SCALE MARK (SYRINGE) ×2 IMPLANT
TOWEL OR 17X26 10 PK STRL BLUE (TOWEL DISPOSABLE) ×2 IMPLANT
TRAY FOLEY METER SIL LF 16FR (CATHETERS) ×1 IMPLANT
WATER STERILE IRR 1500ML POUR (IV SOLUTION) ×3 IMPLANT
WRAP KNEE MAXI GEL POST OP (GAUZE/BANDAGES/DRESSINGS) ×2 IMPLANT

## 2013-02-15 NOTE — Transfer of Care (Signed)
Immediate Anesthesia Transfer of Care Note  Patient: Ethan Lawrence  Procedure(s) Performed: Procedure(s): LEFT TOTAL KNEE ARTHROPLASTY (Left)  Patient Location: PACU  Anesthesia Type:MAC and Spinal  Level of Consciousness: Patient easily awoken, sedated, comfortable, cooperative, following commands, responds to stimulation.   Airway & Oxygen Therapy: Patient spontaneously breathing, ventilating well, oxygen via simple oxygen mask.  Post-op Assessment: Report given to PACU RN, vital signs reviewed and stable, moving all extremities.   Post vital signs: Reviewed and stable.  Complications: No apparent anesthesia complications

## 2013-02-15 NOTE — Anesthesia Procedure Notes (Signed)
Spinal  Patient location during procedure: OR Staffing Anesthesiologist: Megann Easterwood Performed by: anesthesiologist  Preanesthetic Checklist Completed: patient identified, site marked, surgical consent, pre-op evaluation, timeout performed, IV checked, risks and benefits discussed and monitors and equipment checked Spinal Block Patient position: sitting Prep: Betadine Patient monitoring: heart rate, continuous pulse ox and blood pressure Approach: right paramedian Location: L3-4 Injection technique: single-shot Needle Needle type: Spinocan  Needle gauge: 22 G Needle length: 9 cm Additional Notes Expiration date of kit checked and confirmed. Patient tolerated procedure well, without complications.     

## 2013-02-15 NOTE — Anesthesia Preprocedure Evaluation (Addendum)
Anesthesia Evaluation   Patient awake    Reviewed: Allergy & Precautions, H&P , NPO status , [name] Chart, lab work & pertinent test results  History of Anesthesia Complications Negative for: history of anesthetic complications  Airway Mallampati: II TM Distance: >3 FB Neck ROM: Full    Dental  (+) Dental Advisory Given   Pulmonary neg pulmonary ROS, former smoker,          Cardiovascular hypertension, Pt. on medications + CAD, + Past MI and + Peripheral Vascular Disease + dysrhythmias Atrial Fibrillation     Neuro/Psych negative neurological ROS  negative psych ROS   GI/Hepatic negative GI ROS, Neg liver ROS,   Endo/Other  negative endocrine ROS  Renal/GU Renal InsufficiencyRenal disease     Musculoskeletal   Abdominal   Peds  Hematology negative hematology ROS (+)   Anesthesia Other Findings   Reproductive/Obstetrics                           Anesthesia Physical  Anesthesia Plan  ASA: III  Anesthesia Plan: Spinal   Post-op Pain Management:    Induction: Intravenous  Airway Management Planned: Simple Face Mask  Additional Equipment:   Intra-op Plan:   Post-operative Plan:   Informed Consent: I have reviewed the patients History and Physical, chart, labs and discussed the procedure including the risks, benefits and alternatives for the proposed anesthesia with the patient or authorized representative who has indicated his/her understanding and acceptance.   Dental advisory given  Plan Discussed with: CRNA, Anesthesiologist and Surgeon  Anesthesia Plan Comments:        Anesthesia Quick Evaluation

## 2013-02-15 NOTE — Op Note (Signed)
NAME:  Ethan Lawrence                      MEDICAL RECORD NO.:  [id number]                             FACILITY:  [facility]      PHYSICIAN:  Madlyn Frankel. Charlann Boxer, M.D.  DATE OF BIRTH:  21-Jul-1922      DATE OF PROCEDURE:  02/15/2013                                     OPERATIVE REPORT         PREOPERATIVE DIAGNOSIS:  Left knee osteoarthritis.      POSTOPERATIVE DIAGNOSIS:  Left knee osteoarthritis.      FINDINGS:  The patient was noted to have complete loss of cartilage and   bone-on-bone arthritis with associated osteophytes in all 3 compartments of   the knee with a significant synovitis and associated effusion.      PROCEDURE:  Left total knee replacement.      COMPONENTS USED:  DePuy rotating platform posterior stabilized knee   system, a size 5 femur, 4 tibia, 10 mm PS insert, and 41 patellar   button.      SURGEON:  Madlyn Frankel. Charlann Boxer, M.D.      ASSISTANT:  Lanney Gins, PA-C.      ANESTHESIA:  Spinal.      SPECIMENS:  None.      COMPLICATION:  None.      DRAINS:  None.  EBL: <200cc      TOURNIQUET TIME:   Total Tourniquet Time Documented: Thigh (Left) - 9 minutes Thigh (Left) - 7 minutes Total: Thigh (Left) - 16 minutes   Due to inadequate control with the tourniquet I let the tourniquet done after 9 minutes then re inflated it to cement components in place      The patient was stable to the recovery room.      INDICATION FOR PROCEDURE:  Ethan Lawrence is a 79 y.o. male patient of   mine.  The patient had been seen, evaluated, and treated conservatively in the   office with medication, activity modification, and injections.  The patient had   radiographic changes of bone-on-bone arthritis with endplate sclerosis and osteophytes noted.      The patient failed conservative measures including medication, injections, and activity modification, and at this point was ready for more definitive measures.   Based on the radiographic changes and failed  conservative measures, the patient   decided to proceed with total knee replacement.  Risks of infection,   DVT, component failure, need for revision surgery, postop course, and   expectations were all   discussed and reviewed.  Consent was obtained for benefit of pain   relief.      PROCEDURE IN DETAIL:  The patient was brought to the operative theater.   Once adequate anesthesia, preoperative antibiotics, 2 gm of Ancef administered, the patient was positioned supine with the left thigh tourniquet placed.  The  left lower extremity was prepped and draped in sterile fashion.  A time-   out was performed identifying the patient, planned procedure, and   extremity.      The left lower extremity was placed in the [facility] leg holder.  The leg was   exsanguinated,  tourniquet elevated to 250 mmHg.  A midline incision was   made followed by median parapatellar arthrotomy.  Following initial   exposure, attention was first directed to the patella.  Precut   measurement was noted to be 24 mm.  I resected down to 14-15 mm and used a   41 patellar button to restore patellar height as well as cover the cut   surface.      The lug holes were drilled and a metal shim was placed to protect the   patella from retractors and saw blades.      At this point, attention was now directed to the femur.  The femoral   canal was opened with a drill, irrigated to try to prevent fat emboli.  An   intramedullary rod was passed at 5 degrees valgus, 10 mm of bone was   resected off the distal femur.  Following this resection, the tibia was   subluxated anteriorly.  Using the extramedullary guide, 10 mm of bone was resected off   the proximal lateral tibia.  We confirmed the gap would be   stable medially and laterally with a 10 mm insert as well as confirmed   the cut was perpendicular in the coronal plane, checking with an alignment rod.      Once this was done, I sized the femur to be a size 5 in the anterior-    posterior dimension, chose a standard component based on medial and   lateral dimension.  The size 5 rotation block was then pinned in   position anterior referenced using the C-clamp to set rotation.  The   anterior, posterior, and  chamfer cuts were made without difficulty nor   notching making certain that I was along the anterior cortex to help   with flexion gap stability.      The final box cut was made off the lateral aspect of distal femur.      At this point, the tibia was sized to be a size 4, the size 4 tray was   then pinned in position through the medial third of the tubercle,   drilled, and keel punched.  Trial reduction was now carried with a 5 femur,  4 tibia, a 10 mm PS insert, and the 41 patella botton.  The knee was brought to   extension, full extension with good flexion stability with the patella   tracking through the trochlea without application of pressure.  Given   all these findings, the trial components removed.  Final components were   opened and cement was mixed.  The knee was irrigated with normal saline   solution and pulse lavage.  The synovial lining was   then injected with 0.25% Marcaine with epinephrine and 1 cc of Toradol,   total of 61 cc.      The knee was irrigated.  Final implants were then cemented onto clean and   dried cut surfaces of bone with the knee brought to extension with a 10 mm trial insert.      Once the cement had fully cured, the excess cement was removed   throughout the knee.  I confirmed I was satisfied with the range of   motion and stability, and the final 10 mm PS insert was chosen.  It was   placed into the knee.      The tourniquet had been let down after a combine 16 minutes as noted above.  No significant  hemostasis required.  The medium Hemovac drain was placed deep.  The   extensor mechanism was then reapproximated using #1 Vicryl and #0 V-lockwith the knee   in flexion.  The   remaining wound was closed with  2-0 Vicryl and running 4-0 Monocryl.   The knee was cleaned, dried, dressed sterilely using Dermabond and   Aquacel dressing.  The patient was then brought to recovery room in stable condition, tolerating the procedure   well.   Please note that Physician Assistant, Lanney Gins, PA-C, was present for the entirety of the case, and was utilized for pre-operative positioning, peri-operative retractor management, general facilitation of the procedure.  He was also utilized for primary wound closure at the end of the case.              Madlyn Frankel Charlann Boxer, M.D.    02/15/2013 9:29 AM

## 2013-02-15 NOTE — Progress Notes (Addendum)
ANTICOAGULATION CONSULT NOTE - Initial Consult  Pharmacy Consult for Warfarin Indication: afib, VTE prophylaxis  Allergies  Allergen Reactions  . Tape     Pulls skin    Patient Measurements:     Vital Signs: Temp: 98.3 F (36.8 C) (12/08 1200) Temp src: Axillary (12/08 1200) BP: 111/69 mmHg (12/08 1200) Pulse Rate: 88 (12/08 1200)  Labs:  Recent Labs  02/15/13 0625  LABPROT 13.6  INR 1.06    The CrCl is unknown because both a height and weight (above a minimum accepted value) are required for this calculation.   Medical History: Past Medical History  Diagnosis Date  . Hypertension   . Hyperlipidemia   . CAD (coronary artery disease)     a. 05/2004 CABGx4 (LIMA->LAD, VG->RI, VG->OM, VG->RCA);  b. 08/2012 Cath: Native 3VD with 3/4 patent grafts (VG->RI occluded).  . Atrial fibrillation   . Macular degeneration   . Aortic stenosis     a. 08/2012 TEE: EF 45-55%, critical AS, Triv AI, mild to mod MR, Sev TR;  b. 08/2012 AVR (21mm Magna Ease pericardial tissue valve) & TV Repair (30mm MC3 annuloplasty ring).  Marland Kitchen AAA (abdominal aortic aneurysm)     a. 06/2010 s/p Endovascular AAA Repair  . 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 Pain     PT HAS A LOT OF LOWER BACK PAIN- PREVIOUS BACK SURGERY AND DJD  . Pacemaker   . Shortness of breath     MOSTLY WITH EXERTION - IMPROVED SINCE HEART VALVE REPLACEMENT  . Arthritis     LEFT KNEE OA AND PAIN;  DJD LUMBAR  . Anemia requiring transfusions     MOST RECENT WAS JUNE 2014 WITH HEART SURGERY  . Complication of anesthesia     STATES HE DOES WELL WITH DIPROVAN     Assessment: 49 yoM on chronic warfarin PTA for history of atrial fibrillation s/p left total knee replacement 12/8.  Warfarin held for procedure (last dose 12/2) and orders to resume post-op.  Lovenox 40 mg SQ daily ordered to begin 12/9 AM for VTE prophylaxis until INR >= 1.8.  Warfarin dose PTA = 6 mg daily  INR  1.06  BMET, CBC ordered for tomorrow AM.  Hgb 11.5, Plts 126K (11/12).  Goal of Therapy:  INR 2-3 Monitor platelets by anticoagulation protocol: Yes   Plan:  1.  Warfarin 7.5 mg once tonight. 2.  Daily PT/INR. 3.  Most recent SCr was 1.72 on 11/12, gives estimated CrCl~30 ml/min.  For CrCl >/= 30 ml/min, recommended VTE prophylactic dose for Lovenox post-op TKA is 30 mg q12h.  Ortho paged to clarify.  F/u SCr ordered for AM.  Clance Boll 02/15/2013,12:27 PM

## 2013-02-15 NOTE — Plan of Care (Signed)
Problem: Consults Goal: Diagnosis- Total Joint Replacement Primary Total Knee     

## 2013-02-15 NOTE — Evaluation (Signed)
Physical Therapy Evaluation Patient Details Name: Ethan Lawrence MRN: [id number] DOB: [birth year] Today's Date: 02/15/2013 Time: 4098-1191 PT Time Calculation (min): 18 min  PT Assessment / Plan / Recommendation History of Present Illness  LTKA  Clinical Impression  Pt ambulated x 50'. BP 100/58 manually. No dizziness. Pt plans to DC to home of daughter initially. Pt will benefit from PT to address problems.    PT Assessment  Patient needs continued PT services    Follow Up Recommendations  Home health PT    Does the patient have the potential to tolerate intense rehabilitation      Barriers to Discharge        Equipment Recommendations  None recommended by PT    Recommendations for Other Services     Frequency 7X/week    Precautions / Restrictions Precautions Precautions: Knee Precaution Comments: low BP   Pertinent Vitals/Pain L Knee-5- RN notified.      Mobility  Bed Mobility Bed Mobility: Supine to Sit Supine to Sit: 4: Min assist Details for Bed Mobility Assistance: support l leg Transfers Transfers: Sit to Stand;Stand to Sit Sit to Stand: 4: Min assist;From bed;With upper extremity assist Stand to Sit: 4: Min assist;To chair/3-in-1;With upper extremity assist Details for Transfer Assistance: cues for UE and LLE palcement. Ambulation/Gait Ambulation/Gait Assistance: 4: Min assist Ambulation Distance (Feet): 50 Feet Assistive device: Rolling walker Ambulation/Gait Assistance Details: cues for safety w/ RW, sequence and posture. Gait Pattern: Step-to pattern;Decreased step length - left;Decreased stance time - left    Exercises     PT Diagnosis: Difficulty walking  PT Problem List: Decreased strength;Decreased range of motion;Decreased activity tolerance;Decreased mobility;Decreased knowledge of use of DME;Decreased safety awareness;Decreased knowledge of precautions;Pain PT Treatment Interventions: DME instruction;Gait training;Functional mobility  training;Therapeutic activities;Therapeutic exercise;Patient/family education     PT Goals(Current goals can be found in the care plan section) Acute Rehab PT Goals Patient Stated Goal: to go back to work in 2 weeks. PT Goal Formulation: With patient/family Time For Goal Achievement: 02/18/13 Potential to Achieve Goals: Good  Visit Information  Last PT Received On: 02/15/13 Assistance Needed: +1 History of Present Illness: LTKA       Prior Functioning  Home Living Family/patient expects to be discharged to:: Private residence Living Arrangements: Children Available Help at Discharge: Family;Available 24 hours/day Type of Home: House Home Access: Ramped entrance Home Layout: One level Home Equipment: Bedside commode;Cane - single point;Walker - 2 wheels;Shower seat Prior Function Level of Independence: Independent Comments: Pt still works full time at his [employer], 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, advertising Arousal/Alertness: Awake/alert Behavior During Therapy: WFL for tasks assessed/performed Overall Cognitive Status: Within Functional Limits for tasks assessed    Extremity/Trunk Assessment Upper Extremity Assessment Upper Extremity Assessment: Overall WFL for tasks assessed Lower Extremity Assessment Lower Extremity Assessment: LLE deficits/detail LLE Deficits / Details: performs SLR   Balance    End of Session PT - End of Session Activity Tolerance: Patient tolerated treatment well Patient left: in chair;with call bell/phone within reach;with family/visitor present Nurse Communication: Mobility status  GP     Rada Hay 02/15/2013, 6:01 PM

## 2013-02-15 NOTE — Anesthesia Postprocedure Evaluation (Signed)
  Anesthesia Post-op Note  Patient: Ethan Lawrence  Procedure(s) Performed: Procedure(s) (LRB): LEFT TOTAL KNEE ARTHROPLASTY (Left)  Patient Location: PACU  Anesthesia Type: Spinal  Level of Consciousness: awake and alert   Airway and Oxygen Therapy: Patient Spontanous Breathing  Post-op Pain: mild  Post-op Assessment: Post-op Vital signs reviewed, [name] Cardiovascular Status Stable, Respiratory Function Stable, Patent Airway and No signs of Nausea or vomiting  Last Vitals:  Filed Vitals:   02/15/13 1200  BP: 111/69  Pulse: 88  Temp: 36.8 C  Resp: 16    Post-op Vital Signs: stable   Complications: No apparent anesthesia complications

## 2013-02-15 NOTE — Interval H&P Note (Signed)
History and Physical Interval Note:  02/15/2013 7:35 AM  Providence Lanius  has presented today for surgery, with the diagnosis of LEFT KNEE OA   The various methods of treatment have been discussed with the patient and family. After consideration of risks, benefits and other options for treatment, the patient has consented to  Procedure(s): LEFT TOTAL KNEE ARTHROPLASTY (Left) as a surgical intervention .  The patient's history has been reviewed, patient examined, no change in status, stable for surgery.  I have reviewed the patient's chart and labs.  Questions were answered to the patient's satisfaction.     Shelda Pal

## 2013-02-15 NOTE — Preoperative (Signed)
Beta Blockers   Reason not to administer Beta Blockers:Not Applicable, took BB 02/15/13  0500

## 2013-02-15 NOTE — Progress Notes (Signed)
Utilization review completed.  

## 2013-02-16 LAB — BASIC METABOLIC PANEL
BUN: 25 mg/dL — ABNORMAL HIGH (ref 6–23)
CO2: 21 mEq/L (ref 19–32)
Chloride: 103 mEq/L (ref 96–112)
Creatinine, Ser: 1.81 mg/dL — ABNORMAL HIGH (ref 0.50–1.35)
Glucose, Bld: 79 mg/dL (ref 70–99)
Potassium: 4.6 mEq/L (ref 3.5–5.1)

## 2013-02-16 LAB — PROTIME-INR
INR: 1.15 (ref 0.00–1.49)
Prothrombin Time: 14.5 seconds (ref 11.6–15.2)

## 2013-02-16 LAB — CBC
HCT: 22.2 % — ABNORMAL LOW (ref 39.0–52.0)
Hemoglobin: 7.5 g/dL — ABNORMAL LOW (ref 13.0–17.0)
MCV: 91 fL (ref 78.0–100.0)
Platelets: 81 10*3/uL — ABNORMAL LOW (ref 150–400)
RBC: 2.44 MIL/uL — ABNORMAL LOW (ref 4.22–5.81)
RDW: 14.3 % (ref 11.5–15.5)
WBC: 5.4 10*3/uL (ref 4.0–10.5)

## 2013-02-16 LAB — PREPARE RBC (CROSSMATCH)

## 2013-02-16 MED ORDER — ENOXAPARIN SODIUM 30 MG/0.3ML ~~LOC~~ SOLN
30.0000 mg | SUBCUTANEOUS | Status: DC
  Administered 2013-02-17 – 2013-02-18 (×2): 30 mg via SUBCUTANEOUS
  Filled 2013-02-16 (×3): qty 0.3

## 2013-02-16 MED ORDER — WARFARIN SODIUM 6 MG PO TABS
6.0000 mg | ORAL_TABLET | Freq: Once | ORAL | Status: AC
  Administered 2013-02-16: 6 mg via ORAL
  Filled 2013-02-16: qty 1

## 2013-02-16 NOTE — Progress Notes (Signed)
Physical Therapy Treatment Patient Details Name: Ethan Lawrence MRN: [id number] DOB: [birth year] Today's Date: 02/16/2013 Time: 4403-4742 PT Time Calculation (min): 31 min  PT Assessment / Plan / Recommendation  History of Present Illness LTKA   PT Comments   Pt's hgb 7.5. No c/o dizziness but did report being tired. See VS. Pt will plan DC tomorrow.  Follow Up Recommendations  Home health PT     Does the patient have the potential to tolerate intense rehabilitation     Barriers to Discharge        Equipment Recommendations  None recommended by PT    Recommendations for Other Services    Frequency 7X/week   Progress towards PT Goals Progress towards PT goals: Progressing toward goals  Plan Current plan remains appropriate    Precautions / Restrictions Precautions Precautions: Knee Precaution Comments: low BP Restrictions Weight Bearing Restrictions: No   Pertinent Vitals/Pain Supine 89/48 Sit 90/48 After amb. 98/53    Mobility  Bed Mobility Supine to Sit: 4: Min guard Details for Bed Mobility Assistance: cues for  not letting L knee flex fast after swinging leg over the edge Transfers Sit to Stand: 4: Min assist;From bed;With upper extremity assist Stand to Sit: 4: Min assist;With upper extremity assist;To chair/3-in-1 Details for Transfer Assistance: cues for UE and LLE palcement. Ambulation/Gait Ambulation/Gait Assistance: 4: Min assist;1: +2 Total assist (+ 1 for safety with Hgb 7.5) Ambulation Distance (Feet): 100 Feet Assistive device: Rolling walker Ambulation/Gait Assistance Details: cues for seqence and posture, step length. Gait Pattern: Step-to pattern;Decreased step length - left;Decreased stance time - left Gait velocity: decr.    Exercises     PT Diagnosis:    PT Problem List:   PT Treatment Interventions:     PT Goals (current goals can now be found in the care plan section) Acute Rehab PT Goals Patient Stated Goal: get around  Visit  Information  Last PT Received On: 02/16/13 Assistance Needed: +1 History of Present Illness: LTKA    Subjective Data  Patient Stated Goal: get around   Cognition  Cognition Arousal/Alertness: Awake/alert Behavior During Therapy: WFL for tasks assessed/performed Overall Cognitive Status: Within Functional Limits for tasks assessed    Balance  Balance Balance Assessed: Yes Dynamic Standing Balance Dynamic Standing - Level of Assistance: 4: Min assist  End of Session PT - End of Session Equipment Utilized During Treatment: Gait belt Activity Tolerance: Patient tolerated treatment well Patient left: in chair;with call bell/phone within reach;with family/visitor present Nurse Communication: Mobility status   GP     Rada Hay 02/16/2013, 11:42 AM Blanchard Kelch PT [phone]

## 2013-02-16 NOTE — Evaluation (Signed)
Occupational Therapy Evaluation Patient Details Name: Ethan Lawrence MRN: [id number] DOB: Jan 18, 1923 Today's Date: 02/16/2013 Time: 4098-1191 OT Time Calculation (min): 21 min  OT Assessment / Plan / Recommendation History of present illness LTKA   Clinical Impression   Pt practiced 3in1 transfer and did well. No complaint of dizziness. Plans to d/c home with daughter. Will benefit from likely one more session prior to d/c to address LB dressing.    OT Assessment  Patient needs continued OT Services    Follow Up Recommendations  No OT follow up;Supervision/Assistance - 24 hour    Barriers to Discharge      Equipment Recommendations  None recommended by OT    Recommendations for Other Services    Frequency  Min 2X/week    Precautions / Restrictions Precautions Precautions: Knee Precaution Comments: low BP Restrictions Weight Bearing Restrictions: No   Pertinent Vitals/Pain States "its ok" pain    ADL  Eating/Feeding: Simulated;Independent Where Assessed - Eating/Feeding: Chair Grooming: Simulated;Wash/dry hands;Set up Where Assessed - Grooming: Supported sitting Upper Body Bathing: Simulated;Chest;Right arm;Left arm;Abdomen;Set up Where Assessed - Upper Body Bathing: Unsupported sitting Lower Body Bathing: Simulated;Minimal assistance Where Assessed - Lower Body Bathing: Supported sit to stand Upper Body Dressing: Simulated;Set up Where Assessed - Upper Body Dressing: Unsupported sitting Lower Body Dressing: Simulated;Minimal assistance Where Assessed - Lower Body Dressing: Supported sit to stand Toilet Transfer: Performed;Minimal Web designer: Raised toilet seat with arms (or 3-in-1 over toilet) Toileting - Clothing Manipulation and Hygiene: Simulated;Minimal assistance Where Assessed - Engineer, mining and Hygiene: Sit to stand from 3-in-1 or toilet Equipment Used: Rolling walker ADL Comments: Discussed letting HH  assess tub transfer when pt is ready at home. He has a tubseat. Pt able to reach down to L foot today for adjusting slipper. Discussed sequence for LB dressing. Practiced into bathroom for 3in1 transfer.    OT Diagnosis: Generalized weakness  OT Problem List: Decreased strength;Decreased knowledge of use of DME or AE OT Treatment Interventions: Self-care/ADL training;DME and/or AE instruction;Therapeutic activities;Patient/family education   OT Goals(Current goals can be found in the care plan section) Acute Rehab OT Goals Patient Stated Goal: get around OT Goal Formulation: With patient/family Time For Goal Achievement: 02/23/13 Potential to Achieve Goals: Good  Visit Information  Last OT Received On: 02/16/13 Assistance Needed: +1 History of Present Illness: LTKA       Prior Functioning     Home Living Family/patient expects to be discharged to:: Private residence Living Arrangements: Children Available Help at Discharge: Family;Available 24 hours/day Type of Home: House Home Access: Ramped entrance Home Layout: One level Home Equipment: Bedside commode;Cane - single point;Walker - 2 wheels;Shower seat Prior Function Level of Independence: Independent Comments: Pt still works full time at his Scientist, 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         Vision/Perception     Copywriter, advertising Arousal/Alertness: Awake/alert Behavior During Therapy: WFL for tasks assessed/performed Overall Cognitive Status: Within Functional Limits for tasks assessed    Extremity/Trunk Assessment Upper Extremity Assessment Upper Extremity Assessment: RUE deficits/detail;LUE deficits/detail RUE Deficits / Details: ROM to about 120 shoulder flexion; elbow distal WFL LUE Deficits / Details: pt reports weakness; states rotator cuff issues, ROM WFL     Mobility Transfers Transfers: Sit to Stand;Stand to Sit Sit to Stand: 4: Min assist;With upper extremity  assist;From chair/3-in-1 Stand to Sit: 4: Min assist;With upper extremity assist;To chair/3-in-1 Details for Transfer Assistance: cues for UE and LLE palcement.  Exercise     Balance Balance Balance Assessed: Yes Dynamic Standing Balance Dynamic Standing - Level of Assistance: 4: Min assist   End of Session OT - End of Session Equipment Utilized During Treatment: Gait belt;Rolling walker Activity Tolerance: Patient tolerated treatment well Patient left: in chair;with call bell/phone within reach  GO     Ethan Lawrence [phone] 02/16/2013, 11:00 AM

## 2013-02-16 NOTE — Progress Notes (Signed)
Physical Therapy Treatment Patient Details Name: Ethan Lawrence MRN: [id number] DOB: 07-Aug-1922 Today's Date: 02/16/2013 Time: 4098-1191 PT Time Calculation (min): 17 min  PT Assessment / Plan / Recommendation  History of Present Illness LTKA, 2 units of blood.   PT Comments   Pt receiving 2 units of blood. Plans DC in AM.  Follow Up Recommendations  Home health PT     Does the patient have the potential to tolerate intense rehabilitation     Barriers to Discharge        Equipment Recommendations  None recommended by PT    Recommendations for Other Services    Frequency 7X/week   Progress towards PT Goals Progress towards PT goals: Progressing toward goals  Plan Current plan remains appropriate    Precautions / Restrictions Precautions Precautions: Knee Precaution Comments: low BP   Pertinent Vitals/Pain Reports knee is stiff.    Mobility  Bed Mobility Bed Mobility: Sit to Supine Sit to Supine: 4: Min assist Details for Bed Mobility Assistance: support of L leg onto bed, l Transfers Sit to Stand: 4: Min assist;With upper extremity assist;From chair/3-in-1 Stand to Sit: 4: Min assist;With upper extremity assist;To bed Details for Transfer Assistance: cues for UE and LLE palcement. Ambulation/Gait Ambulation/Gait Assistance: 4: Min assist Ambulation Distance (Feet): 5 Feet Assistive device: Rolling walker Ambulation/Gait Assistance Details: R knee buckled several times, cues for safety, concentrate on weight on arms. Did not ambulated as IV battery low  for blood running. Gait Pattern: Step-to pattern;Decreased step length - left;Decreased stance time - left    Exercises Total Joint Exercises Quad Sets: AROM;10 reps;Supine Short Arc Quad: AROM;10 reps;Supine;Left Heel Slides: AROM;10 reps;Supine;Left Hip ABduction/ADduction: AROM;10 reps;Supine;Left Straight Leg Raises: AROM;20 reps;Left Goniometric ROM: 10-90 L knee   PT Diagnosis:    PT Problem List:    PT Treatment Interventions:     PT Goals (current goals can now be found in the care plan section)    Visit Information  Last PT Received On: 02/16/13 Assistance Needed: +1 History of Present Illness: LTKA, 2 units of blood.    Subjective Data      Cognition  Cognition Arousal/Alertness: Awake/alert    Balance     End of Session PT - End of Session Activity Tolerance: Patient tolerated treatment well Patient left: with call bell/phone within reach;in bed Nurse Communication: Mobility status   GP     Rada Hay 02/16/2013, 5:24 PM

## 2013-02-16 NOTE — Progress Notes (Signed)
   Subjective: 1 Day Post-Op Procedure(s) (LRB): LEFT TOTAL KNEE ARTHROPLASTY (Left)   Patient reports pain as mild, pain controlled. No events throughout the night.   Objective:   VITALS:   Filed Vitals:   02/16/13 0540  BP: 104/59  Pulse: 78  Temp: 98.3 F (36.8 C)  Resp: 15    Neurovascular intact Dorsiflexion/Plantar flexion intact Incision: dressing C/D/I No cellulitis present Compartment soft  LABS  Recent Labs  02/16/13 0450  HGB 7.5*  HCT 22.2*  WBC 5.4  PLT 81*     Recent Labs  02/16/13 0450  NA 134*  K 4.6  BUN 25*  CREATININE 1.81*  GLUCOSE 79     Assessment/Plan: 1 Day Post-Op Procedure(s) (LRB): LEFT TOTAL KNEE ARTHROPLASTY (Left) Foley cath d/c'ed Advance diet Up with therapy D/C IV fluids Discharge home with home health  Expected ABLA  Treated with iron and will observe      Anastasio Auerbach. Genette Huertas   PAC  02/16/2013, 8:36 AM

## 2013-02-16 NOTE — Progress Notes (Signed)
ANTICOAGULATION CONSULT NOTE - Follow Up Consult  Pharmacy Consult for Warfarin Indication: afib, VTE prophylaxis  Allergies  Allergen Reactions  . Tape     Pulls skin    Patient Measurements: Height: 5\' 9"  (175.3 cm) Weight: 161 lb (73.029 kg) IBW/kg (Calculated) : 70.7   Vital Signs: Temp: 97.6 F (36.4 C) (12/09 0953) Temp src: Oral (12/09 0953) BP: 91/52 mmHg (12/09 0953) Pulse Rate: 85 (12/09 0953)  Labs:  Recent Labs  02/15/13 0625 02/16/13 0450  HGB  --  7.5*  HCT  --  22.2*  PLT  --  81*  LABPROT 13.6 14.5  INR 1.06 1.15  CREATININE  --  1.81*    Estimated Creatinine Clearance: 28.2 ml/min (by C-G formula based on Cr of 1.81).    Assessment: 73 yoM on chronic warfarin PTA for history of atrial fibrillation s/p left total knee replacement 12/8.  Warfarin held for procedure (last dose 12/2) and orders to resume post-op.  Lovenox 40 mg SQ daily ordered to begin 12/9 AM for VTE prophylaxis until INR >= 1.8.  Warfarin dose PTA = 6 mg daily  INR 1.15  Hgb 7.5, Plts 81K this AM POD1 - ortho aware, treating with iron and monitoring this expected ABLA  SCr 1.81, CrCl~28 ml/min  Goal of Therapy:  INR 2-3 Monitor platelets by anticoagulation protocol: Yes   Plan:  1.  Warfarin 6 mg once tonight. 2.  Daily PT/INR. 3.  Would reduce Lovenox to 30 mg q24h for CrCl<30 ml/min.    Clance Boll 02/16/2013,11:50 AM

## 2013-02-17 LAB — CBC
HCT: 30.7 % — ABNORMAL LOW (ref 39.0–52.0)
Hemoglobin: 10.4 g/dL — ABNORMAL LOW (ref 13.0–17.0)
MCH: 30.3 pg (ref 26.0–34.0)
MCV: 89.5 fL (ref 78.0–100.0)
RBC: 3.43 MIL/uL — ABNORMAL LOW (ref 4.22–5.81)
RDW: 16.1 % — ABNORMAL HIGH (ref 11.5–15.5)
WBC: 5.4 10*3/uL (ref 4.0–10.5)

## 2013-02-17 LAB — TYPE AND SCREEN
Antibody Screen: POSITIVE
DAT, IgG: NEGATIVE
Donor AG Type: NEGATIVE
Donor AG Type: NEGATIVE
PT AG Type: NEGATIVE
Unit division: 0

## 2013-02-17 LAB — BASIC METABOLIC PANEL
BUN: 32 mg/dL — ABNORMAL HIGH (ref 6–23)
CO2: 19 mEq/L (ref 19–32)
Calcium: 8.6 mg/dL (ref 8.4–10.5)
Chloride: 100 mEq/L (ref 96–112)
Creatinine, Ser: 2.05 mg/dL — ABNORMAL HIGH (ref 0.50–1.35)
Potassium: 5.4 mEq/L — ABNORMAL HIGH (ref 3.5–5.1)

## 2013-02-17 MED ORDER — ENOXAPARIN SODIUM 30 MG/0.3ML ~~LOC~~ SOLN: 30.0000 mg | SUBCUTANEOUS | Status: DC

## 2013-02-17 MED ORDER — POLYETHYLENE GLYCOL 3350 17 G PO PACK: 17.0000 g | PACK | Freq: Two times a day (BID) | ORAL | Status: DC

## 2013-02-17 MED ORDER — TIZANIDINE HCL 4 MG PO CAPS: 4.0000 mg | ORAL_CAPSULE | Freq: Three times a day (TID) | ORAL | Status: DC | PRN

## 2013-02-17 MED ORDER — WARFARIN SODIUM 6 MG PO TABS: 6.0000 mg | ORAL_TABLET | Freq: Every evening | ORAL | Status: DC

## 2013-02-17 MED ORDER — WARFARIN SODIUM 6 MG PO TABS
6.0000 mg | ORAL_TABLET | Freq: Once | ORAL | Status: AC
  Administered 2013-02-17: 6 mg via ORAL
  Filled 2013-02-17: qty 1

## 2013-02-17 MED ORDER — HYDROCODONE-ACETAMINOPHEN 10-325 MG PO TABS: 1.0000 | ORAL_TABLET | ORAL | Status: DC | PRN

## 2013-02-17 NOTE — Consult Note (Signed)
Talked to patient and case manager patient already has equipment at home.

## 2013-02-17 NOTE — Progress Notes (Signed)
Physical Therapy Treatment Patient Details Name: Ethan Lawrence MRN: [id number] DOB: 25-May-1922 Today's Date: 02/17/2013 Time: 4098-1191 PT Time Calculation (min): 21 min  PT Assessment / Plan / Recommendation  History of Present Illness L TKA   PT Comments   Pt having more pain today, gait is less steady and requires more assistance. Will see how pt ambulates in PM.   Follow Up Recommendations  Home health PT     Does the patient have the potential to tolerate intense rehabilitation     Barriers to Discharge        Equipment Recommendations       Recommendations for Other Services    Frequency 7X/week   Progress towards PT Goals Progress towards PT goals: Progressing toward goals  Plan Current plan remains appropriate    Precautions / Restrictions Precautions Precautions: Knee;Fall Precaution Comments: low BP Restrictions Weight Bearing Restrictions: No   Pertinent Vitals/Pain 5 L knee    Mobility  Bed Mobility Sit to Supine: Not Tested (comment) Transfers Sit to Stand: 4: Min assist;With upper extremity assist;From chair/3-in-1 Stand to Sit: 4: Min assist;With upper extremity assist;To bed;To chair/3-in-1 Details for Transfer Assistance: cues for UE and LLE palcement.. Pt does not place RLE forward and has increased pain when sitting down. Ambulation/Gait Ambulation/Gait Assistance: 4: Min assist;3: Mod assist Ambulation Distance (Feet): 100 Feet Assistive device: Rolling walker Ambulation/Gait Assistance Details: ferquent cues for sequence and posture, pt needs to rest UE's at times. Pt requires more assist today. , L knee flexed in stance. Gait Pattern: Step-to pattern;Trunk flexed;Decreased step length - left;Decreased stance time - left    Exercises Total Joint Exercises Quad Sets: AROM;10 reps;Supine Short Arc Quad: AROM;10 reps;Supine;Left   PT Diagnosis:    PT Problem List:   PT Treatment Interventions:     PT Goals (current goals can now be  found in the care plan section)    Visit Information  Last PT Received On: 02/17/13 Assistance Needed: +1 History of Present Illness: L TKA    Subjective Data      Cognition  Cognition Arousal/Alertness: Awake/alert;Lethargic Behavior During Therapy: WFL for tasks assessed/performed Overall Cognitive Status: Within Functional Limits for tasks assessed    Balance  Balance Balance Assessed: Yes Dynamic Standing Balance Dynamic Standing - Level of Assistance: 3: Mod assist  End of Session PT - End of Session Equipment Utilized During Treatment: Gait belt Activity Tolerance: Patient limited by pain;Patient limited by fatigue Patient left: in chair;with call bell/phone within reach Nurse Communication: Mobility status   GP     Rada Hay 02/17/2013, 2:31 PM

## 2013-02-17 NOTE — Progress Notes (Signed)
ANTICOAGULATION CONSULT NOTE - Follow Up Consult  Pharmacy Consult for Warfarin Indication: afib, VTE prophylaxis  Allergies  Allergen Reactions  . Tape     Pulls skin    Patient Measurements: Height: 5\' 9"  (175.3 cm) Weight: 161 lb (73.029 kg) IBW/kg (Calculated) : 70.7   Vital Signs: Temp: 98.4 F (36.9 C) (12/10 0657) Temp src: Oral (12/10 0657) BP: 123/74 mmHg (12/10 0657) Pulse Rate: 72 (12/10 0657)  Labs:  Recent Labs  02/15/13 0625 02/16/13 0450 02/17/13 0451  HGB  --  7.5* 10.4*  HCT  --  22.2* 30.7*  PLT  --  81* 95*  LABPROT 13.6 14.5 15.6*  INR 1.06 1.15 1.27  CREATININE  --  1.81* 2.05*    Estimated Creatinine Clearance: 24.9 ml/min (by C-G formula based on Cr of 2.05).    Assessment: 60 yoM on chronic warfarin PTA for history of atrial fibrillation s/p left total knee replacement 12/8.  Warfarin held for procedure (last dose 12/2) and orders to resume post-op.  Lovenox 40 mg SQ daily ordered to begin 12/9 AM for VTE prophylaxis until INR >= 1.8.  Warfarin dose PTA = 6 mg daily  INR slowly rising but subtherapeutic still as expected after doses of 7.5mg  and 6mg  x 1 each  Hgb 10.4 up from 7.5, Plts 95 up from 81K this AM POD2 - ortho aware, treating with iron and monitoring this expected ABLA  SCr 2.05, rising - CrCl 25 ml/min  Goal of Therapy:  INR 2-3 Monitor platelets by anticoagulation protocol: Yes   Plan:  1.  Repeat Warfarin 6 mg once tonight. 2.  Daily PT/INR. 3.  Continue Lovenox 30mg  q24 for CrCl < 30 ml/min   Hessie Knows, PharmD, BCPS Pager [phone] 02/17/2013 10:59 AM

## 2013-02-17 NOTE — Care Management Note (Signed)
  Page 2 of 2   02/17/2013     2:37:26 PM   CARE MANAGEMENT NOTE 02/17/2013  Patient:  Ethan Lawrence, Ethan Lawrence   Account Number:  [id number]  Date Initiated:  02/16/2013  Documentation initiated by:  Colleen Can  Subjective/Objective Assessment:   dx left knee replacemnt     Action/Plan:   CM spoke with patient. Plans are for him to got to his daughter;s home in [place]. He will return to [facility] at a later date.   Anticipated DC Date:  02/17/2013   Anticipated DC Plan:  HOME W HOME HEALTH SERVICES      DC Planning Services  CM consult      [facility] Choice  HOME HEALTH   Choice offered to / List presented to:  C-4 Adult Children        HH arranged  HH-2 PT  HH-3 OT      [facility] agency  [facility]   Status of service:  In process, will continue to follow Medicare Important Message given?   (If response is "NO", the following Medicare IM given date fields will be blank) Date Medicare IM given:   Date Additional Medicare IM given:    Discharge Disposition:    Per UR Regulation:    If discussed at Long Length of Stay Meetings, dates discussed:    Comments:  12//12/2012 Colleen Can BSN RN CCM [phone] Pt plans to go to daughter's home upon discharge from hospital address of daughter-Ethan Lawrence [address] [place] [address] contct info-daughter [name] [phone] CM spoke with daughter who is requesting [facility] for [facility] services. She also states that patient does not need DME- has RW and access to commde seat. [facility] notified for request of services and can provide services. HHrn, pt, ot, coumadin management.

## 2013-02-17 NOTE — Progress Notes (Signed)
   Subjective: 2 Days Post-Op Procedure(s) (LRB): LEFT TOTAL KNEE ARTHROPLASTY (Left)   Patient reports pain as mild, pain controlled. No events throughout the night. Feels better after receiving 2 units of blood yesterday. Ready to be discharged home if she does well with PT and pain stays controlled.  Objective:   VITALS:   Filed Vitals:   02/17/13  BP: 123/74  Pulse: 72  Temp: 98.4 F (36.9 C)   Resp: 16    Neurovascular intact Dorsiflexion/Plantar flexion intact Incision: dressing C/D/I No cellulitis present Compartment soft  LABS  Recent Labs  02/16/13 0450 02/17/13 0451  HGB 7.5* 10.4*  HCT 22.2* 30.7*  WBC 5.4 5.4  PLT 81* 95*     Recent Labs  02/16/13 0450 02/17/13 0451  NA 134* 131*  K 4.6 5.4*  BUN 25* 32*  CREATININE 1.81* 2.05*  GLUCOSE 79 86     Assessment/Plan: 2 Days Post-Op Procedure(s) (LRB): LEFT TOTAL KNEE ARTHROPLASTY (Left) Foley cath d/c'ed Up with therapy D/C IV fluids Discharge home with home health if pain stays controlled and if he does well with PT. Follow up in 2 weeks at [facility]. Follow up with OLIN,Ramel Tobon D in 2 weeks.  Contact information:  [facility] [address], [address] [place] [address] [phone]    Expected ABLA  Treated with iron and will observe      Anastasio Auerbach. Krishawna Stiefel   PAC  02/17/2013, 9:58 AM

## 2013-02-17 NOTE — Progress Notes (Signed)
Physical Therapy Treatment Patient Details Name: Ethan Lawrence MRN: [id number] DOB: 1922-07-15 Today's Date: 02/17/2013 Time: 4098-1191 PT Time Calculation (min): 18 min  PT Assessment / Plan / Recommendation  History of Present Illness L TKA   PT Comments   Daughter present. Pt will benefit from delaying DC another day to improve in safe ambulation and pain control.  Follow Up Recommendations  Home health PT     Does the patient have the potential to tolerate intense rehabilitation     Barriers to Discharge        Equipment Recommendations       Recommendations for Other Services    Frequency 7X/week   Progress towards PT Goals Progress towards PT goals: Progressing toward goals  Plan Current plan remains appropriate    Precautions / Restrictions Precautions Precautions: Knee;Fall Precaution Comments: low BP Restrictions Weight Bearing Restrictions: No   Pertinent Vitals/Pain 6-7 with weight.    Mobility  Bed Mobility Sit to Supine: Not Tested (comment) Transfers Sit to Stand: 4: Min assist;With upper extremity assist;From chair/3-in-1 Stand to Sit: 4: Min assist;With upper extremity assist;To bed;To chair/3-in-1 Details for Transfer Assistance: cues for UE and LLE palcement.. Pt does not place RLE forward and has increased pain when sitting down. Ambulation/Gait Ambulation/Gait Assistance: 4: Min assist Ambulation Distance (Feet): 100 Feet Assistive device: Rolling walker Ambulation/Gait Assistance Details: cues for sequence. Gait Pattern: Step-to pattern;Trunk flexed;Decreased step length - left;Decreased stance time - left Gait velocity: decr.    Exercises Total Joint Exercises Quad Sets: AROM;10 reps;Supine Short Arc Quad: AROM;10 reps;Supine;Left   PT Diagnosis:    PT Problem List:   PT Treatment Interventions:     PT Goals (current goals can now be found in the care plan section)    Visit Information  Last PT Received On:  02/17/13 Assistance Needed: +1 History of Present Illness: L TKA    Subjective Data      Cognition  Cognition Arousal/Alertness: Awake/alert Behavior During Therapy: WFL for tasks assessed/performed Overall Cognitive Status: Within Functional Limits for tasks assessed    Balance  Balance Balance Assessed: Yes Dynamic Standing Balance Dynamic Standing - Level of Assistance: 3: Mod assist  End of Session PT - End of Session Equipment Utilized During Treatment: Gait belt Activity Tolerance: Patient limited by pain;Patient limited by fatigue Patient left: in chair;with call bell/phone within reach;with family/visitor present Nurse Communication: Mobility status   GP     Rada Hay 02/17/2013, 2:34 PM

## 2013-02-17 NOTE — Progress Notes (Signed)
Occupational Therapy Treatment Patient Details Name: Ethan Lawrence MRN: [id number] DOB: [birth year] Today's Date: 02/17/2013 Time: 0930-1009 OT Time Calculation (min): 39 min  OT Assessment / Plan / Recommendation  History of present illness L TKA   OT comments  Pt much more limited by pain today and had greater difficulty with functional transfers and ADL. He required mod assist to balance in standing for LB clothing management. No family present for education. Informed nursing and PA and PA states will see how pt does with 2 sessions PT and possibly d/c later today versus tomorrow.   Follow Up Recommendations  Home health OT;Supervision/Assistance - 24 hour    Barriers to Discharge       Equipment Recommendations  None recommended by OT    Recommendations for Other Services    Frequency Min 2X/week   Progress towards OT Goals Progress towards OT goals: Not progressing toward goals - comment (limited by pain today)  Plan Discharge plan needs to be updated    Precautions / Restrictions Precautions Precautions: Knee;Fall Precaution Comments: low BP Restrictions Weight Bearing Restrictions: No   Pertinent Vitals/Pain 9/10 L knee; reposition, ice, informed PA and nursing    ADL  Lower Body Dressing: Performed;Moderate assistance (able to start clothing over feet but mod for balance) Where Assessed - Lower Body Dressing: Supported sit to stand Toilet Transfer: Simulated;Minimal assistance;Moderate assistance Toilet Transfer Method: Stand pivot Equipment Used: Rolling walker ADL Comments: Pt sitting on EOB finishing bath when OT arrived. Pt stating he hurts 9/10 and had pain meds earlier. He struggled with standing from the bed, requiring several attempts to stand. He needed mod cues for LE positioning and hand placement to self assist. Pt stood to wash periareas and then later pull up pants. He could thread the pants/underwear over feet but needed mod assist to maintain  balance in standing. Pt able to pivot around to recliner with walker but struggled with trying to put weight through L foot. Needed more assist today compared to yesterday. Informed PT, PA and nursing of all. Pt may d/c later today if he does well with PT versus tomorrow per PA. No family present for education.     OT Diagnosis:    OT Problem List:   OT Treatment Interventions:     OT Goals(current goals can now be found in the care plan section)    Visit Information  Last OT Received On: 02/17/13 Assistance Needed: +1 History of Present Illness: L TKA    Subjective Data      Prior Functioning       Cognition  Cognition Arousal/Alertness: Awake/alert Behavior During Therapy: WFL for tasks assessed/performed Overall Cognitive Status: Within Functional Limits for tasks assessed    Mobility  Transfers Transfers: Sit to Stand;Stand to Sit Sit to Stand: 4: Min assist;With upper extremity assist;From bed Stand to Sit: 4: Min assist;With upper extremity assist;To bed;To chair/3-in-1 Details for Transfer Assistance: cues for UE and LLE palcement.    Exercises      Balance Balance Balance Assessed: Yes Dynamic Standing Balance Dynamic Standing - Level of Assistance: 3: Mod assist   End of Session OT - End of Session Equipment Utilized During Treatment: Rolling walker Activity Tolerance: Patient limited by pain Patient left: in chair;with call bell/phone within reach  GO     Ethan Lawrence [phone] 02/17/2013, 11:09 AM

## 2013-02-18 LAB — PROTIME-INR
INR: 1.49 (ref 0.00–1.49)
Prothrombin Time: 17.6 seconds — ABNORMAL HIGH (ref 11.6–15.2)

## 2013-02-18 MED ORDER — ENOXAPARIN (LOVENOX) PATIENT EDUCATION KIT
PACK | Freq: Once | Status: DC
  Filled 2013-02-18: qty 1

## 2013-02-18 NOTE — Progress Notes (Signed)
Pt stable, scripts, d/c instructions given with no questions/concerns voiced by pt or grandson.  Pt transported via wheelchair to private vehicle by grandson and NT.

## 2013-02-18 NOTE — Progress Notes (Signed)
Physical Therapy Treatment Patient Details Name: Ethan Lawrence MRN: [id number] DOB: Dec 23, 1922 Today's Date: 02/18/2013 Time: 1010-1035 PT Time Calculation (min): 25 min  PT Assessment / Plan / Recommendation  History of Present Illness L TKA   PT Comments   POD #3 pt and family eager to D/C.  With family member present for Pt education on safe handling tech during transfers and gait as well as HEP.  Assisted pt out of recliner to amb in hallway, then to bed to perform TE's.  Demonstrated and instructed TKR TE's following HEP handout.  Instructed on freq/reps and use of ICE. No stairs as pt will be entering home via ramp.   Follow Up Recommendations  Home health PT     Does the patient have the potential to tolerate intense rehabilitation     Barriers to Discharge        Equipment Recommendations       Recommendations for Other Services    Frequency 7X/week   Progress towards PT Goals Progress towards PT goals: Progressing toward goals  Plan      Precautions / Restrictions Precautions Precautions: Knee;Fall Precaution Comments: low BP Restrictions Weight Bearing Restrictions: No Other Position/Activity Restrictions: WBAT    Pertinent Vitals/Pain C/o 3/10 pain Applied ICE after TE's     Mobility  Bed Mobility Bed Mobility: Sit to Supine Supine to Sit: 4: Min guard Sit to Supine: 5: Supervision Details for Bed Mobility Assistance: increased time Transfers Transfers: Sit to Stand;Stand to Sit Sit to Stand: 4: Min assist;4: Min guard;From chair/3-in-1 Stand to Sit: 4: Min guard;4: Min assist;To toilet Details for Transfer Assistance: cues for UE and LLE palcement.. Pt does not place RLE forward and has increased pain when sitting down. Ambulation/Gait Ambulation/Gait Assistance: 4: Min guard;4: Min Environmental consultant (Feet): 115 Feet Assistive device: Rolling walker Ambulation/Gait Assistance Details: 25% VC's on safety with turns and backward  gait Gait Pattern: Step-to pattern;Trunk flexed;Decreased step length - left;Decreased stance time - left Stairs: No (pt has a ramp)    Exercises   Total Knee Replacement TE's 10 reps B LE ankle pumps 10 reps knee presses 10 reps heel slides  10 reps SAQ's 10 reps SLR's 10 reps ABD Followed by ICE    PT Goals (current goals can now be found in the care plan section)    Visit Information  Last PT Received On: 02/18/13 Assistance Needed: +1 History of Present Illness: L TKA    Subjective Data      Cognition  Cognition Arousal/Alertness: Awake/alert Behavior During Therapy: WFL for tasks assessed/performed Overall Cognitive Status: Within Functional Limits for tasks assessed    Balance  Dynamic Standing Balance Dynamic Standing - Level of Assistance: 5: Stand by assistance  End of Session PT - End of Session Equipment Utilized During Treatment: Gait belt Activity Tolerance: Patient tolerated treatment well Patient left: in bed;with call bell/phone within reach;with family/visitor present   Felecia Shelling  PTA [facility]  Acute  Rehab Pager      [phone]

## 2013-02-18 NOTE — Progress Notes (Signed)
   Subjective: 3 Days Post-Op Procedure(s) (LRB): LEFT TOTAL KNEE ARTHROPLASTY (Left)   Patient reports pain as mild, pain controlled. No events throughout the night. Ready to be discharged home.  Objective:   VITALS:   Filed Vitals:   02/18/13 0643  BP: 133/74  Pulse: 87  Temp: 98.9 F (37.2 C)  Resp: 14    Neurovascular intact Dorsiflexion/Plantar flexion intact Incision: dressing C/D/I No cellulitis present Compartment soft  LABS  Recent Labs  02/16/13 0450 02/17/13 0451  HGB 7.5* 10.4*  HCT 22.2* 30.7*  WBC 5.4 5.4  PLT 81* 95*     Recent Labs  02/16/13 0450 02/17/13 0451  NA 134* 131*  K 4.6 5.4*  BUN 25* 32*  CREATININE 1.81* 2.05*  GLUCOSE 79 86     Assessment/Plan: 3 Days Post-Op Procedure(s) (LRB): LEFT TOTAL KNEE ARTHROPLASTY (Left) ACE bandage removed Up with therapy Discharge home with home health Follow up in 2 weeks at [facility]. Follow up with OLIN,Kashlyn Salinas D in 2 weeks.  Contact information:  [facility] [address], [address] [place] [address] [phone]    ABLA  Treated with 2 units of blood and now iron     Anastasio Auerbach. Adlean Hardeman   PAC  02/18/2013, 8:17 AM

## 2013-02-18 NOTE — Progress Notes (Signed)
Occupational Therapy Treatment Patient Details Name: Ethan Lawrence MRN: [id number] DOB: 05-06-1922 Today's Date: 02/18/2013 Time: 1478-2956 OT Time Calculation (min): 13 min  OT Assessment / Plan / Recommendation  History of present illness L TKA   OT comments  Pt progressing towards goals.  Cues for sit <>stand and safety with standing for adls.  Grandson present for tx.  Follow Up Recommendations  Home health OT;Supervision/Assistance - 24 hour    Barriers to Discharge       Equipment Recommendations  None recommended by OT    Recommendations for Other Services    Frequency Min 2X/week   Progress towards OT Goals Progress towards OT goals: Progressing toward goals  Plan      Precautions / Restrictions Precautions Precautions: Knee;Fall Precaution Comments: low BP Restrictions Weight Bearing Restrictions: No   Pertinent Vitals/Pain LLE moderate when walking.  Premedicated, repositioned and ice applied    ADL  Lower Body Dressing: Performed;Moderate assistance Where Assessed - Lower Body Dressing: Supported sit to Pharmacist, hospital: Performed;Minimal Dentist Method: Sit to Barista: Raised toilet seat with arms (or 3-in-1 over toilet) Equipment Used: Rolling walker Transfers/Ambulation Related to ADLs: ambulated to bathroom and transfered to 3:1.  Grandson present and does not think 3:1 will fit over commode as space is very small.  If needs to use toilet prior to [facility] arriving, he will use it as a BSC ADL Comments: Donned pants:  cued to only release one hand at a time when using RW.  Mod A overall but no LOB today    OT Diagnosis:    OT Problem List:   OT Treatment Interventions:     OT Goals(current goals can now be found in the care plan section)    Visit Information  Last OT Received On: 02/18/13 Assistance Needed: +1 History of Present Illness: L TKA    Subjective Data      Prior Functioning        Cognition  Cognition Arousal/Alertness: Awake/alert Behavior During Therapy: WFL for tasks assessed/performed Overall Cognitive Status: Within Functional Limits for tasks assessed    Mobility . Bed Mobility Supine to Sit: 4: Min guard Details for Bed Mobility Assistance: for safety Transfers Sit to Stand: 4: Min assist;From chair/3-in-1;From bed;With upper extremity assist Stand to Sit: 4: Min assist;To chair/3-in-1 Details for Transfer Assistance: cues for UE and LLE palcement.. Pt does not place RLE forward and has increased pain when sitting down.    Exercises      Balance Dynamic Standing Balance Dynamic Standing - Level of Assistance: 5: Stand by assistance   End of Session OT - End of Session Equipment Utilized During Treatment: Rolling walker Activity Tolerance: Patient tolerated treatment well Patient left: in chair;with call bell/phone within reach  GO     Ethan Lawrence 02/18/2013, 10:58 AM Marica Otter, OTR/L [phone] 02/18/2013

## 2013-02-21 NOTE — Discharge Summary (Signed)
Physician Discharge Summary  Patient ID: Ethan Lawrence MRN: [id number] DOB/AGE: 11-24-1922 79 y.o.  Admit date: 02/15/2013 Discharge date: 02/18/2013   Procedures:  Procedure(s) (LRB): LEFT TOTAL KNEE ARTHROPLASTY (Left)  Attending Physician:  Dr. Durene Romans   Admission Diagnoses:   Left knee OA / pain  Discharge Diagnoses:  Principal Problem:   S/P left TKA  Past Medical History  Diagnosis Date  . Hypertension   . Hyperlipidemia   . CAD (coronary artery disease)     a. 05/2004 CABGx4 (LIMA->LAD, VG->RI, VG->OM, VG->RCA);  b. 08/2012 Cath: Native 3VD with 3/4 patent grafts (VG->RI occluded).  . Atrial fibrillation   . Macular degeneration   . Aortic stenosis     a. 08/2012 TEE: EF 45-55%, critical AS, Triv AI, mild to mod MR, Sev TR;  b. 08/2012 AVR (21mm Magna Ease pericardial tissue valve) & TV Repair (30mm MC3 annuloplasty ring).  Marland Kitchen AAA (abdominal aortic aneurysm)     a. 06/2010 s/p Endovascular AAA Repair  . 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 Pain     PT HAS A LOT OF LOWER BACK PAIN- PREVIOUS BACK SURGERY AND DJD  . Pacemaker   . Shortness of breath     MOSTLY WITH EXERTION - IMPROVED SINCE HEART VALVE REPLACEMENT  . Arthritis     LEFT KNEE OA AND PAIN;  DJD LUMBAR  . Anemia requiring transfusions     MOST RECENT WAS JUNE 2014 WITH HEART SURGERY  . Complication of anesthesia     STATES HE DOES WELL WITH DIPROVAN    HPI: Ethan Lawrence, 79 y.o. male, has a history of pain and functional disability in the left knee due to arthritis and has failed non-surgical conservative treatments for greater than 12 weeks to includeNSAID's and/or analgesics, use of assistive devices and activity modification. Onset of symptoms was gradual, starting 5+ years ago with gradually worsening course since that time. The patient noted prior procedures on the knee to include arthroscopy on the left knee(s). Patient currently  rates pain in the left knee(s) at 9 out of 10 with activity. Patient has worsening of pain with activity and weight bearing, pain that interferes with activities of daily living, pain with passive range of motion, crepitus and joint swelling. Patient has evidence of periarticular osteophytes and joint space narrowing by imaging studies. There is no active infection. Risks, benefits and expectations were discussed with the patient. Risks including but not limited to the risk of anesthesia, blood clots, nerve damage, blood vessel damage, failure of the prosthesis, infection and up to and including death. Patient understand the risks, benefits and expectations and wishes to proceed with surgery.   PCP: Mikey Bussing, MD   Discharged Condition: good  Hospital Course:  Patient underwent the above stated procedure on 02/15/2013. Patient tolerated the procedure well and brought to the recovery room in good condition and subsequently to the floor.  POD #1 BP: 104/59 ; Pulse: 78 ; Temp: 98.3 F (36.8 C) ; Resp: 15 Pt's foley was removed. IV maintained, received 2 units of blood. Patient reports pain as mild, pain controlled. No events throughout the night.  Neurovascular intact, dorsiflexion/plantar flexion intact, incision: dressing C/D/I, no cellulitis present and compartment soft.   LABS  Basename    HGB  7.5  HCT  22.2   POD #2  BP: 123/74 ; Pulse: 72 ; Temp: 98.4 F (36.9 C) ; Resp:  16  Patient reports pain as mild, pain controlled. No events throughout the night. Feels better after receiving 2 units of blood yesterday. Ready to be discharged home if she does well with PT and pain stays controlled. Neurovascular intact, dorsiflexion/plantar flexion intact, incision: dressing C/D/I, no cellulitis present and compartment soft.   LABS  Basename    HGB  10.4  HCT  30.7   POD #3  BP: 133/74 ; Pulse: 87 ; Temp: 98.9 F (37.2 C) ; Resp: 14  Patient reports pain as mild, pain controlled. No  events throughout the night. Ready to be discharged home. Neurovascular intact, dorsiflexion/plantar flexion intact, incision: dressing C/D/I, no cellulitis present and compartment soft.   LABS   No new labs  Discharge Exam: General appearance: alert, cooperative and no distress Extremities: Homans sign is negative, no sign of DVT, no edema, redness or tenderness in the calves or thighs and no ulcers, gangrene or trophic changes  Disposition:    Home or Self Care with follow up in 2 weeks   Follow-up Information   Follow up with Shelda Pal, MD. Schedule an appointment as soon as possible for a visit in 2 weeks.   Specialty:  Orthopedic Surgery   Contact information:   [address] [place] [address] [phone]       Discharge Orders   Future Appointments Provider Department Dept Phone   03/01/2013 3:15 PM Lesleigh Noe, MD [facility] [phone]   03/01/2013 3:30 PM [facility] 1 [facility] [place] Office [phone]   03/29/2013 8:50 AM [facility] [phone]   Future Orders Complete By Expires   Call MD / Call 911  As directed    Comments:     If you experience chest pain or shortness of breath, CALL 911 and be transported to the hospital emergency room.  If you develope a fever above 101 F, pus (white drainage) or increased drainage or redness at the wound, or calf pain, call your surgeon's office.   Change dressing  As directed    Comments:     Maintain surgical dressing for 10-14 days, then change the dressing daily with sterile 4 x 4 inch gauze dressing and tape. Keep the area dry and clean.   Constipation Prevention  As directed    Comments:     Drink plenty of fluids.  Prune juice may be helpful.  You may use a stool softener, such as Colace (over the counter) 100 mg twice a day.  Use MiraLax (over the counter) for constipation as needed.   Diet - low sodium  heart healthy  As directed    Discharge instructions  As directed    Comments:     Maintain surgical dressing for 10-14 days, then replace with gauze and tape. Keep the area dry and clean until follow up. Follow up in 2 weeks at [facility]. Call with any questions or concerns.   Increase activity slowly as tolerated  As directed    TED hose  As directed    Comments:     Use stockings (TED hose) for 2 weeks on both leg(s).  You may remove them at night for sleeping.   Weight bearing as tolerated  As directed    Questions:     Laterality:     Extremity:          Medication List    STOP taking these medications  traMADol 50 MG tablet  Commonly known as:  ULTRAM      TAKE these medications       docusate sodium 100 MG capsule  Commonly known as:  COLACE  Take 100 mg by mouth 2 (two) times daily.     enoxaparin 30 MG/0.3ML injection  Commonly known as:  LOVENOX  Inject 0.3 mLs (30 mg total) into the skin daily.     ferrous sulfate 325 (65 FE) MG EC tablet  Take 325 mg by mouth 2 (two) times daily.     folic acid 1 MG tablet  Commonly known as:  FOLVITE  Take 1 mg by mouth daily.     furosemide 20 MG tablet  Commonly known as:  LASIX  Take 20 mg by mouth every morning.     gabapentin 300 MG capsule  Commonly known as:  NEURONTIN  Take 300 mg by mouth 2 (two) times daily.     HYDROcodone-acetaminophen 10-325 MG per tablet  Commonly known as:  NORCO  Take 1-2 tablets by mouth every 4 (four) hours as needed.     hydrocortisone cream 1 %  Apply 1 application topically 2 (two) times daily as needed (for leg itching).     metoprolol tartrate 25 MG tablet  Commonly known as:  LOPRESSOR  Take 25 mg by mouth every morning.     polyethylene glycol packet  Commonly known as:  MIRALAX / GLYCOLAX  Take 17 g by mouth 2 (two) times daily.     predniSONE 5 MG tablet  Commonly known as:  DELTASONE  Take 1 tablet (5 mg total) by mouth daily with breakfast.       PRESERVISION AREDS 2 Caps  Take 1 tablet by mouth 2 (two) times daily.     PRILOSEC 20 MG capsule  Generic drug:  omeprazole  Take 20 mg by mouth daily.     rOPINIRole 2 MG tablet  Commonly known as:  REQUIP  Take 2 mg by mouth at bedtime.     simvastatin 20 MG tablet  Commonly known as:  ZOCOR  Take 20 mg by mouth at bedtime.     spironolactone 25 MG tablet  Commonly known as:  ALDACTONE  Take 25 mg by mouth daily.     thiamine 100 MG tablet  Take 1 tablet (100 mg total) by mouth daily.     tiZANidine 4 MG capsule  Commonly known as:  ZANAFLEX  Take 1 capsule (4 mg total) by mouth 3 (three) times daily as needed for muscle spasms.     warfarin 6 MG tablet  Commonly known as:  COUMADIN  Take 1 tablet (6 mg total) by mouth every evening.         Signed: Anastasio Auerbach. Treyton Slimp   PAC  02/21/2013, 7:13 PM

## 2013-03-01 ENCOUNTER — Encounter: Payer: Self-pay | Admitting: Internal Medicine

## 2013-03-01 ENCOUNTER — Ambulatory Visit (INDEPENDENT_AMBULATORY_CARE_PROVIDER_SITE_OTHER): Payer: Medicare PPO | Admitting: Interventional Cardiology

## 2013-03-01 ENCOUNTER — Ambulatory Visit (INDEPENDENT_AMBULATORY_CARE_PROVIDER_SITE_OTHER): Payer: Medicare PPO | Admitting: *Deleted

## 2013-03-01 ENCOUNTER — Encounter: Payer: Self-pay | Admitting: Interventional Cardiology

## 2013-03-01 VITALS — BP 110/62 | HR 54 | Ht 69.0 in | Wt 165.0 lb

## 2013-03-01 DIAGNOSIS — I2510 Atherosclerotic heart disease of native coronary artery without angina pectoris: Secondary | ICD-10-CM

## 2013-03-01 DIAGNOSIS — I4891 Unspecified atrial fibrillation: Secondary | ICD-10-CM

## 2013-03-01 DIAGNOSIS — I442 Atrioventricular block, complete: Secondary | ICD-10-CM

## 2013-03-01 DIAGNOSIS — Z954 Presence of other heart-valve replacement: Secondary | ICD-10-CM

## 2013-03-01 DIAGNOSIS — I5032 Chronic diastolic (congestive) heart failure: Secondary | ICD-10-CM

## 2013-03-01 DIAGNOSIS — I482 Chronic atrial fibrillation, unspecified: Secondary | ICD-10-CM

## 2013-03-01 DIAGNOSIS — D649 Anemia, unspecified: Secondary | ICD-10-CM

## 2013-03-01 DIAGNOSIS — Z7901 Long term (current) use of anticoagulants: Secondary | ICD-10-CM

## 2013-03-01 DIAGNOSIS — Z952 Presence of prosthetic heart valve: Secondary | ICD-10-CM | POA: Insufficient documentation

## 2013-03-01 LAB — MDC_IDC_ENUM_SESS_TYPE_INCLINIC
Battery Impedance: 100 Ohm
Brady Statistic RV Percent Paced: 79 %
Lead Channel Impedance Value: 468 Ohm
Lead Channel Impedance Value: 54432 Ohm
Lead Channel Setting Pacing Pulse Width: 0.4 ms
Lead Channel Setting Sensing Sensitivity: 5.6 mV

## 2013-03-01 MED ORDER — SPIRONOLACTONE 25 MG PO TABS: ORAL_TABLET | ORAL | Status: DC

## 2013-03-01 MED ORDER — FUROSEMIDE 20 MG PO TABS: ORAL_TABLET | ORAL | Status: DC

## 2013-03-01 NOTE — Patient Instructions (Signed)
Your physician has recommended you make the following change in your medication:   1. Stop Metoprolol.  2. You can use the lasix and spironolactone as needed.  Your physician wants you to follow-up in: 6 months with Dr. Katrinka Blazing. You will receive a reminder letter in the mail two months in advance. If you don't receive a letter, please call our office to schedule the follow-up appointment.

## 2013-03-01 NOTE — Progress Notes (Signed)
Pacemaker check in clinic for possible change from VVIR to DDDR per GT after questionable NSR on EKG. Mode was unable to be changed to DDDR due to single chamber system---(atrial impedance >9,999 in both uni/bipolar---atrial lead was not visible in cxr). No changes made this session. Patient to follow up as scheduled.

## 2013-03-01 NOTE — Progress Notes (Signed)
Patient ID: Ethan Lawrence, male   DOB: 02-Oct-1922, 79 y.o.   MRN: [id number] Past Medical History  Coronary artery disease with CABG 2006, LIMA to LAD, SVG to OM, SVG to Diag., and SVG to PDA   Chronic atrial fibrillation   aortic valve replacement with bioprosthetic, June 2014      [address] [place], [place]  [address] Phone: [phone] Fax:  [phone]  Date:  03/01/2013   ID:  Ethan Lawrence, DOB 12-06-1922, MRN [id number]  PCP:  Mikey Bussing, MD   ASSESSMENT:  1. Status post aortic valve replacement, bioprosthetic 2. Chronic atrial fibrillation 3. Relatively low blood pressure 4. Chronic Coumadin therapy 5. Recent left knee replacement  PLAN:  1. Discontinue metoprolol 2. Furosemide and spironolactone were used as needed. The patient is not currently taking those medications.   SUBJECTIVE: Ethan Lawrence is a 79 y.o. male who recently had left knee replacement by Dr. Charlann Boxer. The procedure was uneventful. He did receive blood. There were no cardiac complications. He is not on diuretic therapy chronically. He denies dyspnea. He has not had palpitations or syncope. He has not noticed blood in his urine or stool. No transient neurological complaints. He speak with his daughter he has not received either Aldactone or furosemide in the recent past. They have cut the metoprolol down to 25 mg daily because of low blood pressures by home health.   Wt Readings from Last 3 Encounters:  03/01/13 165 lb (74.844 kg)  02/15/13 161 lb (73.029 kg)  02/15/13 161 lb (73.029 kg)     Past Medical History  Diagnosis Date  . Hypertension   . Hyperlipidemia   . CAD (coronary artery disease)     a. 05/2004 CABGx4 (LIMA->LAD, VG->RI, VG->OM, VG->RCA);  b. 08/2012 Cath: Native 3VD with 3/4 patent grafts (VG->RI occluded).  . Atrial fibrillation   . Macular degeneration   . Aortic stenosis     a. 08/2012 TEE: EF 45-55%, critical AS, Triv AI, mild  to mod MR, Sev TR;  b. 08/2012 AVR (21mm Magna Ease pericardial tissue valve) & TV Repair (30mm MC3 annuloplasty ring).  Marland Kitchen AAA (abdominal aortic aneurysm)     a. 06/2010 s/p Endovascular AAA Repair  . 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 Pain     PT HAS A LOT OF LOWER BACK PAIN- PREVIOUS BACK SURGERY AND DJD  . Pacemaker   . Shortness of breath     MOSTLY WITH EXERTION - IMPROVED SINCE HEART VALVE REPLACEMENT  . Arthritis     LEFT KNEE OA AND PAIN;  DJD LUMBAR  . Anemia requiring transfusions     MOST RECENT WAS JUNE 2014 WITH HEART SURGERY  . Complication of anesthesia     STATES HE DOES WELL WITH DIPROVAN    Current Outpatient Prescriptions  Medication Sig Dispense Refill  . docusate sodium (COLACE) 100 MG capsule Take 100 mg by mouth 2 (two) times daily.      Marland Kitchen enoxaparin (LOVENOX) 30 MG/0.3ML injection Inject 0.3 mLs (30 mg total) into the skin daily.  12 Syringe  0  . ferrous sulfate 325 (65 FE) MG EC tablet Take 325 mg by mouth 2 (two) times daily.      . folic acid (FOLVITE) 1 MG tablet Take 1 mg by mouth daily.       . furosemide (LASIX) 20 MG tablet Take 20 mg by  mouth every morning.      . gabapentin (NEURONTIN) 300 MG capsule Take 300 mg by mouth 2 (two) times daily.      Marland Kitchen HYDROcodone-acetaminophen (NORCO) 10-325 MG per tablet Take 1-2 tablets by mouth every 4 (four) hours as needed.  100 tablet  0  . hydrocortisone cream 1 % Apply 1 application topically 2 (two) times daily as needed (for leg itching).      . metoprolol tartrate (LOPRESSOR) 25 MG tablet Take 25 mg by mouth every morning.      . Multiple Vitamins-Minerals (PRESERVISION AREDS 2) CAPS Take 1 tablet by mouth 2 (two) times daily.      Marland Kitchen omeprazole (PRILOSEC) 20 MG capsule Take 20 mg by mouth daily.      . polyethylene glycol (MIRALAX / GLYCOLAX) packet Take 17 g by mouth 2 (two) times daily.  14 each  0  . predniSONE (DELTASONE) 5 MG tablet Take 1 tablet (5 mg  total) by mouth daily with breakfast.  30 tablet  1  . rOPINIRole (REQUIP) 2 MG tablet Take 2 mg by mouth at bedtime.      . simvastatin (ZOCOR) 20 MG tablet Take 20 mg by mouth at bedtime.       Marland Kitchen spironolactone (ALDACTONE) 25 MG tablet Take 25 mg by mouth daily.      Marland Kitchen thiamine 100 MG tablet Take 1 tablet (100 mg total) by mouth daily.      Marland Kitchen tiZANidine (ZANAFLEX) 4 MG capsule Take 1 capsule (4 mg total) by mouth 3 (three) times daily as needed for muscle spasms.  50 capsule  0  . warfarin (COUMADIN) 6 MG tablet Take 1 tablet (6 mg total) by mouth every evening.  30 tablet  0   No current facility-administered medications for this visit.    Allergies:    Allergies  Allergen Reactions  . Tape     Pulls skin    Social History:  The patient  reports that he quit smoking about 37 years ago. He has never used smokeless tobacco. He reports that he does not drink alcohol or use illicit drugs.   ROS:  Please see the history of present illness.   No palpitations, syncope, or transient neurological symptoms .   All other systems reviewed and negative.   OBJECTIVE: VS:  BP 110/62  Pulse 54  Ht 5\' 9"  (1.753 m)  Wt 165 lb (74.844 kg)  BMI 24.36 kg/m2 Well nourished, well developed, in no acute distress, elderly HEENT: normal Neck: JVD flat. Carotid bruit absent  Cardiac:  normal S1, S2; IIRR; 1/6 systolic murmur right upper sternal border. No diastolic murmur. Lungs:  clear to auscultation bilaterally, no wheezing, rhonchi or rales Abd: soft, nontender, no hepatomegaly Ext: Edema absent. Pulses 2+ Skin: warm and dry Neuro:  CNs 2-12 intact, no focal abnormalities noted  EKG:  Not performed       Signed, Darci Needle III, MD 03/01/2013 3:41 PM

## 2013-03-29 ENCOUNTER — Encounter: Payer: Medicare PPO | Admitting: *Deleted

## 2013-04-06 ENCOUNTER — Encounter: Payer: Self-pay | Admitting: *Deleted

## 2013-04-19 ENCOUNTER — Encounter: Payer: Self-pay | Admitting: Internal Medicine

## 2013-09-01 ENCOUNTER — Encounter: Payer: Self-pay | Admitting: Internal Medicine

## 2013-09-01 ENCOUNTER — Ambulatory Visit (INDEPENDENT_AMBULATORY_CARE_PROVIDER_SITE_OTHER): Payer: Medicare PPO | Admitting: Internal Medicine

## 2013-09-01 ENCOUNTER — Ambulatory Visit (INDEPENDENT_AMBULATORY_CARE_PROVIDER_SITE_OTHER): Payer: Medicare PPO | Admitting: Interventional Cardiology

## 2013-09-01 ENCOUNTER — Encounter: Payer: Self-pay | Admitting: Interventional Cardiology

## 2013-09-01 VITALS — BP 116/69 | HR 87 | Ht 70.0 in | Wt 164.0 lb

## 2013-09-01 DIAGNOSIS — I2510 Atherosclerotic heart disease of native coronary artery without angina pectoris: Secondary | ICD-10-CM

## 2013-09-01 DIAGNOSIS — Z950 Presence of cardiac pacemaker: Secondary | ICD-10-CM | POA: Insufficient documentation

## 2013-09-01 DIAGNOSIS — I482 Chronic atrial fibrillation, unspecified: Secondary | ICD-10-CM

## 2013-09-01 DIAGNOSIS — I5032 Chronic diastolic (congestive) heart failure: Secondary | ICD-10-CM

## 2013-09-01 DIAGNOSIS — I4891 Unspecified atrial fibrillation: Secondary | ICD-10-CM

## 2013-09-01 DIAGNOSIS — Z954 Presence of other heart-valve replacement: Secondary | ICD-10-CM

## 2013-09-01 DIAGNOSIS — Z7901 Long term (current) use of anticoagulants: Secondary | ICD-10-CM

## 2013-09-01 LAB — MDC_IDC_ENUM_SESS_TYPE_INCLINIC
Date Time Interrogation Session: 20150624093208
Lead Channel Impedance Value: 484 Ohm
Lead Channel Impedance Value: 67 Ohm
Lead Channel Pacing Threshold Amplitude: 0.5 V
Lead Channel Pacing Threshold Pulse Width: 0.4 ms
Lead Channel Sensing Intrinsic Amplitude: 15.67 mV
Lead Channel Setting Pacing Pulse Width: 0.4 ms
MDC IDC MSMT BATTERY IMPEDANCE: 101 Ohm
MDC IDC MSMT BATTERY REMAINING LONGEVITY: 145 mo
MDC IDC MSMT BATTERY VOLTAGE: 2.79 V
MDC IDC SET LEADCHNL RV PACING AMPLITUDE: 2.5 V
MDC IDC SET LEADCHNL RV SENSING SENSITIVITY: 4 mV
MDC IDC STAT BRADY RV PERCENT PACED: 72 %

## 2013-09-01 NOTE — Assessment & Plan Note (Signed)
His medtronic DDD PM is working normally. Will recheck in several months. 

## 2013-09-01 NOTE — Assessment & Plan Note (Signed)
His ventricular rate is well controlled. He will continue his current medical therapy. 

## 2013-09-01 NOTE — Progress Notes (Signed)
HPI Ethan Lawrence returns today for followup. He is a very pleasant 80 year old man with a history of aortic stenosis status post aortic valve replacement carried out approximately a year ago, which was complicated by the development of complete heart block for which the patient underwent insertion of a permanent pacemaker. In the interim he has been stable. He had a very slow but steady recovery after his valve replacement surgery. He has class II heart failure symptoms. He denies chest pain or syncope. The patient also has atrial fibrillation with a controlled ventricular response. 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 Allergies  Allergen Reactions  . Tape     Pulls skin     Current Outpatient Prescriptions  Medication Sig Dispense Refill  . docusate sodium (COLACE) 100 MG capsule Take 100 mg by mouth 2 (two) times daily.      . ferrous sulfate 325 (65 FE) MG EC tablet Take 325 mg by mouth 2 (two) times daily.      . folic acid (FOLVITE) 1 MG tablet Take 1 mg by mouth daily.       . furosemide (LASIX) 20 MG tablet Take as needed  30 tablet    . gabapentin (NEURONTIN) 300 MG capsule Take 300 mg by mouth 2 (two) times daily.      Marland Kitchen. HYDROcodone-acetaminophen (NORCO) 10-325 MG per tablet Take 1-2 tablets by mouth every 4 (four) hours as needed.  100 tablet  0  . hydrocortisone cream 1 % Apply 1 application topically 2 (two) times daily as needed (for leg itching).      . Multiple Vitamins-Minerals (PRESERVISION AREDS 2) CAPS Take 1 tablet by mouth 2 (two) times daily.      Marland Kitchen. omeprazole (PRILOSEC) 20 MG capsule Take 20 mg by mouth daily.      . predniSONE (DELTASONE) 5 MG tablet Take 1 tablet (5 mg total) by mouth daily with breakfast.  30 tablet  1  . rOPINIRole (REQUIP) 2 MG tablet Take 2 mg by mouth at bedtime.      . simvastatin (ZOCOR) 20 MG tablet Take 20 mg by mouth at bedtime.       Marland Kitchen. warfarin (COUMADIN) 6 MG tablet Take 1 tablet (6 mg total) by mouth  every evening.  30 tablet  0   No current facility-administered medications for this visit.     Past Medical History  Diagnosis Date  . Hypertension   . Hyperlipidemia   . CAD (coronary artery disease)     a. 05/2004 CABGx4 (LIMA->LAD, VG->RI, VG->OM, VG->RCA);  b. 08/2012 Cath: Native 3VD with 3/4 patent grafts (VG->RI occluded).  . Atrial fibrillation   . Macular degeneration   . Aortic stenosis     a. 08/2012 TEE: EF 45-55%, critical AS, Triv AI, mild to mod MR, Sev TR;  b. 08/2012 AVR (21mm Magna Ease pericardial tissue valve) & TV Repair (30mm MC3 annuloplasty ring).  Marland Kitchen. AAA (abdominal aortic aneurysm)     a. 06/2010 s/p Endovascular AAA Repair  . 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 Pain     PT HAS A LOT OF LOWER BACK PAIN- PREVIOUS BACK SURGERY AND DJD  . Pacemaker   . Shortness of breath     MOSTLY WITH EXERTION - IMPROVED SINCE HEART VALVE REPLACEMENT  . Arthritis     LEFT KNEE OA AND PAIN;  DJD LUMBAR  . Anemia requiring transfusions  MOST RECENT WAS JUNE 2014 WITH HEART SURGERY  . Complication of anesthesia     STATES HE DOES WELL WITH DIPROVAN    ROS:   All systems reviewed and negative except as noted in the HPI.   Past Surgical History  Procedure Laterality Date  . Endovascular stent insertion  07/05/2010  . Appendectomy    . Coronary artery bypass graft    . Vascular surgery    . Back surgery    . Cardiac catheterization    . Esophagogastroduodenoscopy  06/19/2011    Procedure: ESOPHAGOGASTRODUODENOSCOPY (EGD);  Surgeon: Graylin ShiverSalem F Ganem, MD;  [facility];  Service: Gastroenterology;  Laterality: N/A;  . Colonoscopy  06/19/2011    Procedure: COLONOSCOPY;  Surgeon: Graylin ShiverSalem F Ganem, MD;  [facility];  Service: Gastroenterology;  Laterality: N/A;  . Eye surgery      bilater cataracts removed  . Aortic valve replacement N/A 09/01/2012    Procedure: REDO AORTIC VALVE REPLACEMENT (AVR);  Surgeon: Kerin PernaPeter Van  Trigt, MD;  [facility];  Service: Open Heart Surgery;  Laterality: N/A;  . Tricuspid valve replacement N/A 09/01/2012    Procedure: TRICUSPID VALVE REPAIR;  Surgeon: Kerin PernaPeter Van Trigt, MD;  [facility];  Service: Open Heart Surgery;  Laterality: N/A;  . Intraoperative transesophageal echocardiogram N/A 09/01/2012    Procedure: INTRAOPERATIVE TRANSESOPHAGEAL ECHOCARDIOGRAM;  Surgeon: Kerin PernaPeter Van Trigt, MD;  [facility];  Service: Open Heart Surgery;  Laterality: N/A;  . Pacemaker insertion  09/07/2012    Medtronic Sensia single chamber pacemaker implanted by Dr Ladona Ridgelaylor   . Surgery about 50 yrs ago for perforated ulcer and repair of hiatal hernia    . Total knee arthroplasty Left 02/15/2013    Procedure: LEFT TOTAL KNEE ARTHROPLASTY;  Surgeon: Shelda PalMatthew D Olin, MD;  Location: [facility] ORS;  Service: Orthopedics;  Laterality: Left;     Family History  Problem Relation Age of Onset  . Coronary artery disease Brother      History   Social History  . Marital Status: Married    Spouse Name: N/A    Number of Children: N/A  . Years of Education: N/A   Occupational History  . Not on file.   Social History Main Topics  . Smoking status: Former Smoker    Quit date: 10/29/1975  . Smokeless tobacco: Never Used  . Alcohol Use: No  . Drug Use: No  . Sexual Activity: Not on file   Other Topics Concern  . Not on file   Social History Narrative   Pt lives in [place] with his wife.  He receives most of his medical care in [place] and stays with his dtr here when necessary.     BP 116/69  Pulse 87  Ht 5\' 10"  (1.778 m)  Wt 164 lb (74.39 kg)  BMI 23.53 kg/m2  Physical Exam:  Well appearing elderly man, NAD HEENT: Unremarkable Neck:  7 cm JVD, no thyromegally Back:  No CVA tenderness Lungs:  Clear with no wheezes, rales, or rhonchi. HEART:  IRegular rate rhythm, no murmurs, no rubs, no clicks Abd:  soft, positive bowel sounds, no organomegally, no rebound, no guarding Ext:  2 plus pulses, no  edema, no cyanosis, no clubbing Skin:  No rashes no nodules Neuro:  CN II through XII intact, motor grossly intact   DEVICE  Normal device function.  See PaceArt for details. Device in the VVI mode   Assess/Plan:

## 2013-09-01 NOTE — Assessment & Plan Note (Signed)
His symptoms are class II. He is encouraged to continue his current medical therapy, and maintain a low-sodium diet.

## 2013-09-01 NOTE — Progress Notes (Signed)
Patient ID: Ethan Lawrence, male   DOB: 09-06-1922, 80 y.o.   MRN: [id number]    [address] [place], [place]  [id number] Phone: [phone] Fax:  [phone]  Date:  09/01/2013   ID:  Ethan Lawrence, DOB 09-06-1922, MRN [id number]  PCP:  Mikey BussingVAN TRIGT III,PETER, MD   ASSESSMENT:  1. Status post aortic valve replacement with a bioprosthesis, stable 2. Coronary artery disease stable with patent bypass grafts documented at the time of aortic valve replacement 3. Postoperative third-degree heart block requiring permanent pacemaker implantation, now functioning normally 4. Abdominal aortic aneurysm with endovascular repair April 2012 5. Chronic atrial fibrillation 6. Chronic anticoagulation therapy Chronic combined systolic and diastolic heart failure, stable  PLAN:   1. Patient is doing well off diuretic therapy without evidence of heart failure. We will maintain the current regimen 2. Clinical followup with me in one year 3. He should call of dyspnea, swelling, syncope, or other complaints 4. Call if bleeding   SUBJECTIVE: Ethan Lawrence is a 80 y.o. male who is now one year out from aortic valve replacement for aortic stenosis and heart failure. He is now up diuretic therapy without reaccumulation of fluid or dyspnea. He denies angina. He did not require any significant re\re grafting at the time of aortic valve replacement with a bioprosthesis as his grafts were widely patent with the exception of occlusion of the ramus intermedius. He denies orthopnea, PND, edema, angina, syncope, or palpitations. No blood in his urine or stool.   Wt Readings from Last 3 Encounters:  09/01/13 164 lb (74.39 kg)  03/01/13 165 lb (74.844 kg)  02/15/13 161 lb (73.029 kg)     Past Medical History  Diagnosis Date  . Hypertension   . Hyperlipidemia   . CAD (coronary artery disease)     a. 05/2004 CABGx4 (LIMA->LAD, VG->RI, VG->OM, VG->RCA);  b. 08/2012 Cath: Native  3VD with 3/4 patent grafts (VG->RI occluded).  . Atrial fibrillation   . Macular degeneration   . Aortic stenosis     a. 08/2012 TEE: EF 45-55%, critical AS, Triv AI, mild to mod MR, Sev TR;  b. 08/2012 AVR (21mm Magna Ease pericardial tissue valve) & TV Repair (30mm MC3 annuloplasty ring).  Marland Kitchen. AAA (abdominal aortic aneurysm)     a. 06/2010 s/p Endovascular AAA Repair  . 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 Pain     PT HAS A LOT OF LOWER BACK PAIN- PREVIOUS BACK SURGERY AND DJD  . Pacemaker   . Shortness of breath     MOSTLY WITH EXERTION - IMPROVED SINCE HEART VALVE REPLACEMENT  . Arthritis     LEFT KNEE OA AND PAIN;  DJD LUMBAR  . Anemia requiring transfusions     MOST RECENT WAS JUNE 2014 WITH HEART SURGERY  . Complication of anesthesia     STATES HE DOES WELL WITH DIPROVAN    Current Outpatient Prescriptions  Medication Sig Dispense Refill  . docusate sodium (COLACE) 100 MG capsule Take 100 mg by mouth 2 (two) times daily.      . ferrous sulfate 325 (65 FE) MG EC tablet Take 325 mg by mouth 2 (two) times daily.      . folic acid (FOLVITE) 1 MG tablet Take 1 mg by mouth daily.       . furosemide (LASIX) 20 MG tablet Take as needed  30 tablet    .  gabapentin (NEURONTIN) 300 MG capsule Take 300 mg by mouth 2 (two) times daily.      Marland Kitchen. HYDROcodone-acetaminophen (NORCO) 10-325 MG per tablet Take 1-2 tablets by mouth every 4 (four) hours as needed.  100 tablet  0  . hydrocortisone cream 1 % Apply 1 application topically 2 (two) times daily as needed (for leg itching).      . Multiple Vitamins-Minerals (PRESERVISION AREDS 2) CAPS Take 1 tablet by mouth 2 (two) times daily.      Marland Kitchen. omeprazole (PRILOSEC) 20 MG capsule Take 20 mg by mouth daily.      . predniSONE (DELTASONE) 5 MG tablet Take 1 tablet (5 mg total) by mouth daily with breakfast.  30 tablet  1  . rOPINIRole (REQUIP) 2 MG tablet Take 2 mg by mouth at bedtime.      . simvastatin  (ZOCOR) 20 MG tablet Take 20 mg by mouth at bedtime.       Marland Kitchen. warfarin (COUMADIN) 6 MG tablet Take 1 tablet (6 mg total) by mouth every evening.  30 tablet  0   No current facility-administered medications for this visit.    Allergies:    Allergies  Allergen Reactions  . Tape     Pulls skin    Social History:  The patient  reports that he quit smoking about 37 years ago. He has never used smokeless tobacco. He reports that he does not drink alcohol or use illicit drugs.   ROS:  Please see the history of present illness.   Appetite is stable. 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   All other systems reviewed and negative.   OBJECTIVE: VS:  There were no vitals taken for this visit. Well nourished, well developed, in no acute distress, elderly and relatively strong-appearing 89 and 80 year old HEENT: normal Neck: JVD flat. Carotid bruit absent  Cardiac:  normal S1, S2; RRR; no murmur Lungs:  clear to auscultation bilaterally, no wheezing, rhonchi or rales Abd: soft, nontender, no hepatomegaly Ext: Absent  Pulses 2+ Skin: warm and dry Neuro:  CNs 2-12 intact, no focal abnormalities noted  EKG:  Not repeated       Signed, Darci NeedleHenry W. B. Smith III, MD 09/01/2013 9:55 AM

## 2013-09-01 NOTE — Patient Instructions (Signed)
Your physician recommends that you continue on your current medications as directed. Please refer to the Current Medication list given to you today.  Your physician wants you to follow-up in: 6-8 months  You will receive a reminder letter in the mail two months in advance. If you don't receive a letter, please call our office to schedule the follow-up appointment.  

## 2013-09-01 NOTE — Patient Instructions (Addendum)
En[place] in [place] [employer]--send transmission once reader is received, send in survey and then transmission in 1 mth and send in [place]. Call 800 number if any questions about Carelink Smart.   Remote monitoring is used to monitor your Pacemaker of ICD from home  . This monitoring reduces the number of office visits required to check your device to one time per year. It allows us to keep an eye on the functioning of your device to ensure it is working properly. You are scheduled for a device check from home on 12/04/14 . You may send your transmission at any time that day. If you have a wireless device, the transmission will be sent automatically. After your physician reviews your transmission, you will receive a postcard with your next transmission date.  Your physician wants you to follow-up in: 1 YEAR WITH DR. Ladona RidgelAYLOR. You will receive a reminder letter in the mail two months in advance. If you don't receive a letter, please call our office to schedule the follow-up appointment.

## 2013-09-01 NOTE — Assessment & Plan Note (Signed)
He denies anginal symptoms. He remains active, working in his store. No change in medical therapy.

## 2013-09-16 ENCOUNTER — Telehealth: Payer: Self-pay | Admitting: Cardiology

## 2013-09-16 NOTE — Telephone Encounter (Signed)
Spoke with pt and informed that due to upgrades with the carelink home monitoring that we need him to send a manual transmission. Pt stated that he would do it on Monday 7-13 when his daughter would be there.

## 2013-10-29 ENCOUNTER — Telehealth: Payer: Self-pay | Admitting: Cardiology

## 2013-10-29 NOTE — Telephone Encounter (Signed)
Spoke with pt daughter and informed her that pt 1st transmission was received on 09-07-13 and that we need pt to send 2nd transmission from his carelink smart. She verbalized understanding and said she would do this today.

## 2013-12-03 ENCOUNTER — Ambulatory Visit (INDEPENDENT_AMBULATORY_CARE_PROVIDER_SITE_OTHER): Payer: Medicare PPO | Admitting: *Deleted

## 2013-12-03 ENCOUNTER — Telehealth: Payer: Self-pay | Admitting: Cardiology

## 2013-12-03 DIAGNOSIS — I482 Chronic atrial fibrillation, unspecified: Secondary | ICD-10-CM

## 2013-12-03 DIAGNOSIS — I4891 Unspecified atrial fibrillation: Secondary | ICD-10-CM

## 2013-12-03 NOTE — Telephone Encounter (Signed)
Confirmed remote transmission with pt daughter.  

## 2013-12-03 NOTE — Progress Notes (Signed)
Remote pacemaker transmission.   

## 2013-12-08 LAB — MDC_IDC_ENUM_SESS_TYPE_REMOTE
Battery Impedance: 125 Ohm
Battery Voltage: 2.78 V
Date Time Interrogation Session: 20150925140022
Lead Channel Impedance Value: 67 Ohm
Lead Channel Pacing Threshold Amplitude: 0.375 V
Lead Channel Pacing Threshold Pulse Width: 0.4 ms
MDC IDC MSMT BATTERY REMAINING LONGEVITY: 121 mo
MDC IDC MSMT LEADCHNL RV IMPEDANCE VALUE: 466 Ohm
MDC IDC SET LEADCHNL RV PACING AMPLITUDE: 2.5 V
MDC IDC SET LEADCHNL RV PACING PULSEWIDTH: 0.4 ms
MDC IDC SET LEADCHNL RV SENSING SENSITIVITY: 4 mV
MDC IDC STAT BRADY RV PERCENT PACED: 90 %

## 2013-12-24 ENCOUNTER — Encounter: Payer: Self-pay | Admitting: Cardiology

## 2014-01-12 ENCOUNTER — Encounter: Payer: Self-pay | Admitting: Internal Medicine

## 2014-02-17 ENCOUNTER — Encounter (HOSPITAL_COMMUNITY): Payer: Self-pay | Admitting: Internal Medicine

## 2014-03-07 ENCOUNTER — Ambulatory Visit (INDEPENDENT_AMBULATORY_CARE_PROVIDER_SITE_OTHER): Payer: Medicare PPO | Admitting: *Deleted

## 2014-03-07 DIAGNOSIS — I482 Chronic atrial fibrillation, unspecified: Secondary | ICD-10-CM

## 2014-03-07 DIAGNOSIS — I5032 Chronic diastolic (congestive) heart failure: Secondary | ICD-10-CM

## 2014-03-07 LAB — MDC_IDC_ENUM_SESS_TYPE_REMOTE
Battery Remaining Longevity: 117 mo
Battery Voltage: 2.78 V
Date Time Interrogation Session: 20151228142857
Lead Channel Setting Pacing Pulse Width: 0.4 ms
Lead Channel Setting Sensing Sensitivity: 4 mV
MDC IDC MSMT BATTERY IMPEDANCE: 150 Ohm
MDC IDC MSMT LEADCHNL RA IMPEDANCE VALUE: 67 Ohm
MDC IDC MSMT LEADCHNL RV IMPEDANCE VALUE: 482 Ohm
MDC IDC MSMT LEADCHNL RV PACING THRESHOLD AMPLITUDE: 0.375 V
MDC IDC MSMT LEADCHNL RV PACING THRESHOLD PULSEWIDTH: 0.4 ms
MDC IDC SET LEADCHNL RV PACING AMPLITUDE: 2.5 V
MDC IDC STAT BRADY RV PERCENT PACED: 88 %

## 2014-03-07 NOTE — Progress Notes (Signed)
Remote pacemaker check. 

## 2014-03-15 ENCOUNTER — Encounter: Payer: Self-pay | Admitting: Cardiology

## 2014-03-21 ENCOUNTER — Encounter: Payer: Self-pay | Admitting: Internal Medicine

## 2014-06-06 ENCOUNTER — Ambulatory Visit (INDEPENDENT_AMBULATORY_CARE_PROVIDER_SITE_OTHER): Payer: Medicare PPO | Admitting: *Deleted

## 2014-06-06 ENCOUNTER — Telehealth: Payer: Self-pay | Admitting: Cardiology

## 2014-06-06 DIAGNOSIS — I482 Chronic atrial fibrillation, unspecified: Secondary | ICD-10-CM

## 2014-06-06 LAB — MDC_IDC_ENUM_SESS_TYPE_REMOTE
Battery Impedance: 150 Ohm
Battery Voltage: 2.79 V
Brady Statistic RV Percent Paced: 86 %
Date Time Interrogation Session: 20160328171517
Lead Channel Impedance Value: 67 Ohm
Lead Channel Pacing Threshold Amplitude: 0.25 V
Lead Channel Pacing Threshold Pulse Width: 0.4 ms
Lead Channel Setting Pacing Pulse Width: 0.4 ms
Lead Channel Setting Sensing Sensitivity: 4 mV
MDC IDC MSMT BATTERY REMAINING LONGEVITY: 116 mo
MDC IDC MSMT LEADCHNL RV IMPEDANCE VALUE: 464 Ohm
MDC IDC SET LEADCHNL RV PACING AMPLITUDE: 2.5 V

## 2014-06-06 NOTE — Telephone Encounter (Signed)
Confirmed remote transmission w/ pt daughter.   

## 2014-06-06 NOTE — Progress Notes (Signed)
Remote pacemaker transmission.   

## 2014-06-15 ENCOUNTER — Encounter: Payer: Self-pay | Admitting: Cardiology

## 2014-06-21 ENCOUNTER — Encounter: Payer: Self-pay | Admitting: Internal Medicine

## 2014-09-05 ENCOUNTER — Ambulatory Visit (INDEPENDENT_AMBULATORY_CARE_PROVIDER_SITE_OTHER): Payer: Medicare PPO | Admitting: *Deleted

## 2014-09-05 DIAGNOSIS — I442 Atrioventricular block, complete: Secondary | ICD-10-CM | POA: Diagnosis not present

## 2014-09-05 NOTE — Progress Notes (Signed)
Remote pacemaker transmission.   

## 2014-09-08 LAB — CUP PACEART REMOTE DEVICE CHECK
Battery Impedance: 150 Ohm
Battery Remaining Longevity: 116 mo
Battery Voltage: 2.78 V
Brady Statistic RV Percent Paced: 86 %
Date Time Interrogation Session: 20160627134218
Lead Channel Impedance Value: 67 Ohm
Lead Channel Pacing Threshold Amplitude: 0.375 V
Lead Channel Setting Pacing Amplitude: 2.5 V
MDC IDC MSMT LEADCHNL RV IMPEDANCE VALUE: 472 Ohm
MDC IDC MSMT LEADCHNL RV PACING THRESHOLD PULSEWIDTH: 0.4 ms
MDC IDC SET LEADCHNL RV PACING PULSEWIDTH: 0.4 ms
MDC IDC SET LEADCHNL RV SENSING SENSITIVITY: 5.6 mV

## 2014-10-05 ENCOUNTER — Encounter: Payer: Self-pay | Admitting: *Deleted

## 2014-10-10 ENCOUNTER — Encounter: Payer: Self-pay | Admitting: Internal Medicine

## 2014-11-14 NOTE — Progress Notes (Signed)
Cardiology Office Note   Date:  11/15/2014   ID:  Ethan Lawrence, DOB 1923-01-07, MRN [id number]  PCP:  PROVIDER NOT IN SYSTEM  Cardiologist:  Lesleigh Noe, MD   Chief Complaint  Patient presents with  . Coronary Artery Disease  . Cardiac Valve Problem      History of Present Illness: Ethan Lawrence is a 81 y.o. male who presents for  Bioprosthetic aortic valve 2014, atrial fibrillation, hypertension, hyperlipidemia, CAD with previous CABG 2006 valve, complete heart block, and permanent pacemaker. Also history of abdominal aortic aneurysm with endovascular repair 2012. Less severe but still important clinical problems include hypertension and hyperlipidemia.   The patient has no significant dyspnea, angina, orthopnea, or palpitations. He has had one episode where he found himself on the floor at home and did not know what happened. This is over 2 months ago. There was no head trauma. It has not recurred.  He is still working 6 days a week. He still lives independently. His daughter and son-in-law spent more time near home in case he needs help.  Past Medical History  Diagnosis Date  . Hypertension   . Hyperlipidemia   . CAD (coronary artery disease)     a. 05/2004 CABGx4 (LIMA->LAD, VG->RI, VG->OM, VG->RCA);  b. 08/2012 Cath: Native 3VD with 3/4 patent grafts (VG->RI occluded).  . Atrial fibrillation   . Macular degeneration   . Aortic stenosis     a. 08/2012 TEE: EF 45-55%, critical AS, Triv AI, mild to mod MR, Sev TR;  b. 08/2012 AVR (21mm Magna Ease pericardial tissue valve) & TV Repair (30mm MC3 annuloplasty ring).  Marland Kitchen AAA (abdominal aortic aneurysm)     a. 06/2010 s/p Endovascular AAA Repair  . 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 Pain     PT HAS A LOT OF LOWER BACK PAIN- PREVIOUS BACK SURGERY AND DJD  . Pacemaker   . Shortness of breath     MOSTLY WITH EXERTION - IMPROVED SINCE HEART VALVE REPLACEMENT    . Arthritis     LEFT KNEE OA AND PAIN;  DJD LUMBAR  . Anemia requiring transfusions     MOST RECENT WAS JUNE 2014 WITH HEART SURGERY  . Complication of anesthesia     STATES HE DOES WELL WITH DIPROVAN    Past Surgical History  Procedure Laterality Date  . Endovascular stent insertion  07/05/2010  . Appendectomy    . Coronary artery bypass graft    . Vascular surgery    . Back surgery    . Cardiac catheterization    . Esophagogastroduodenoscopy  06/19/2011    Procedure: ESOPHAGOGASTRODUODENOSCOPY (EGD);  Surgeon: Graylin Shiver, MD;  Location: [facility] OR;  Service: Gastroenterology;  Laterality: N/A;  . Colonoscopy  06/19/2011    Procedure: COLONOSCOPY;  Surgeon: Graylin Shiver, MD;  Location: [facility] OR;  Service: Gastroenterology;  Laterality: N/A;  . Eye surgery      bilater cataracts removed  . Aortic valve replacement N/A 09/01/2012    Procedure: REDO AORTIC VALVE REPLACEMENT (AVR);  Surgeon: Kerin Perna, MD;  Location: [facility] OR;  Service: Open Heart Surgery;  Laterality: N/A;  . Tricuspid valve replacement N/A 09/01/2012    Procedure: TRICUSPID VALVE REPAIR;  Surgeon: Kerin Perna, MD;  Location: [facility] OR;  Service: Open Heart Surgery;  Laterality: N/A;  . Intraoperative transesophageal echocardiogram N/A 09/01/2012    Procedure: INTRAOPERATIVE TRANSESOPHAGEAL ECHOCARDIOGRAM;  Surgeon: Kerin Perna, MD;  Location: [facility] OR;  Service: Open Heart Surgery;  Laterality: N/A;  . Pacemaker insertion  09/07/2012    Medtronic Sensia single chamber pacemaker implanted by Dr Ladona Ridgel   . Surgery about 50 yrs ago for perforated ulcer and repair of hiatal hernia    . Total knee arthroplasty Left 02/15/2013    Procedure: LEFT TOTAL KNEE ARTHROPLASTY;  Surgeon: Shelda Pal, MD;  Location: [facility] ORS;  Service: Orthopedics;  Laterality: Left;  . Permanent pacemaker insertion N/A 09/07/2012    Procedure: PERMANENT PACEMAKER INSERTION;  Surgeon: Marinus Maw, MD;  Location: [facility] CATH LAB;  Service: Cardiovascular;   Laterality: N/A;     Current Outpatient Prescriptions  Medication Sig Dispense Refill  . docusate sodium (COLACE) 100 MG capsule Take 100 mg by mouth 2 (two) times daily.    . ferrous sulfate 325 (65 FE) MG EC tablet Take 325 mg by mouth 2 (two) times daily.    . folic acid (FOLVITE) 1 MG tablet Take 1 mg by mouth daily.     . furosemide (LASIX) 20 MG tablet Take 20 mg by mouth daily as needed (swelling).    . gabapentin (NEURONTIN) 300 MG capsule Take 300 mg by mouth 2 (two) times daily.    Marland Kitchen HYDROcodone-acetaminophen (NORCO) 10-325 MG per tablet Take 1-2 tablets by mouth every 4 (four) hours as needed. (Patient taking differently: Take 1-2 tablets by mouth every 4 (four) hours as needed (pain). ) 100 tablet 0  . hydrocortisone cream 1 % Apply 1 application topically 2 (two) times daily as needed (for leg itching).    . Multiple Vitamins-Minerals (PRESERVISION AREDS 2) CAPS Take 1 tablet by mouth 2 (two) times daily.    Marland Kitchen omeprazole (PRILOSEC) 20 MG capsule Take 20 mg by mouth daily.    . predniSONE (DELTASONE) 5 MG tablet Take 1 tablet (5 mg total) by mouth daily with breakfast. 30 tablet 1  . rOPINIRole (REQUIP) 2 MG tablet Take 2 mg by mouth at bedtime.    . simvastatin (ZOCOR) 20 MG tablet Take 20 mg by mouth at bedtime.     Marland Kitchen warfarin (COUMADIN) 6 MG tablet Take 1 tablet (6 mg total) by mouth every evening. 30 tablet 0   No current facility-administered medications for this visit.    Allergies:   Tape    Social History:  The patient  reports that he quit smoking about 39 years ago. He has never used smokeless tobacco. He reports that he does not drink alcohol or use illicit drugs.   Family History:  The patient's family history includes Coronary artery disease in his brother.    ROS:  Please see the history of present illness.   Otherwise, review of systems are positive for exertional bilateral lower extremity and gluteal discomfort. His been told by neurosurgery that this is  related to degenerative disc disease. Hydrocodone helps the pain..   All other systems are reviewed and negative.    PHYSICAL EXAM: VS:  BP 134/82 mmHg  Pulse 83  Ht  (1.778 m)  Wt 76.259 kg (168 lb 1.9 oz)  BMI 24.12 kg/m2 , BMI Body mass index is 24.12 kg/(m^2). GEN: Well nourished, well developed, in no acute distress. Appears gentleman his stated age. HEENT: normal Neck: no JVD, carotid bruits, or masses Cardiac: RRR; no murmurs, rubs, or gallops,no edema  Respiratory:  clear to auscultation bilaterally, normal work of breathing GI: soft, nontender, nondistended, + BS MS: no  deformity or atrophy Skin: warm and dry, no rash Neuro:  Strength and sensation are intact Psych: euthymic mood, full affect   EKG:  EKG is ordered today. The ekg ordered today demonstrates ventricular paced rhythm with underlying probable atrial fibrillation.   Recent Labs: No results found for requested labs within last 365 days.    Lipid Panel No results found for: CHOL, TRIG, HDL, CHOLHDL, VLDL, LDLCALC, LDLDIRECT    Wt Readings from Last 3 Encounters:  11/15/14 76.259 kg (168 lb 1.9 oz)  11/15/14 76.658 kg (169 lb)  09/01/13 74.39 kg (164 lb)      Other studies Reviewed: Additional studies/ records that were reviewed today include: Reviewed previous vascular records. Review of the above records demonstrates: Last CT scan identified less than 50% stenoses in femoral and iliac arteries bilaterally. No high-grade obstruction was noted.   ASSESSMENT AND PLAN:  1. Coronary artery disease involving native coronary artery of native heart without angina pectoris Denies anginal complaints  2. Chronic diastolic heart failure No evidence of volume overload  3. Chronic a-fib on Coumadin Ventricular pacing with good rate control  4. Essential hypertension Controlled without significant elevation.  5. S/P AVR No evidence of bile prosthetic valve dysfunction.  6. Bilateral lower  extremity pain with ambulation, felt to be neurogenic  Current medicines are reviewed at length with the patient today.  The patient does not have concerns regarding medicines.  The following changes have been made:  no change.  Cautioned to notify us if recurrent episodes of falling/syncope. The patient should also let us know if angina or dyspnea occur. I encouraged him not to drive.  Labs/ tests ordered today include:  No orders of the defined types were placed in this encounter.     Disposition:   FU with HS in 1 year  Signed, Lesleigh Noe, MD  11/15/2014 11:05 AM    [facility] [address] [place], [place], [place]  [address] Phone: [phone]; Fax: [phone]

## 2014-11-15 ENCOUNTER — Ambulatory Visit (INDEPENDENT_AMBULATORY_CARE_PROVIDER_SITE_OTHER): Payer: Medicare PPO | Admitting: Interventional Cardiology

## 2014-11-15 ENCOUNTER — Encounter: Payer: Self-pay | Admitting: Internal Medicine

## 2014-11-15 ENCOUNTER — Encounter: Payer: Self-pay | Admitting: Interventional Cardiology

## 2014-11-15 ENCOUNTER — Ambulatory Visit (INDEPENDENT_AMBULATORY_CARE_PROVIDER_SITE_OTHER): Payer: Medicare PPO | Admitting: Internal Medicine

## 2014-11-15 VITALS — BP 110/66 | HR 82 | Ht 70.5 in | Wt 169.0 lb

## 2014-11-15 VITALS — BP 134/82 | HR 83 | Ht 70.0 in | Wt 168.1 lb

## 2014-11-15 DIAGNOSIS — M79605 Pain in left leg: Secondary | ICD-10-CM

## 2014-11-15 DIAGNOSIS — I5032 Chronic diastolic (congestive) heart failure: Secondary | ICD-10-CM

## 2014-11-15 DIAGNOSIS — M79604 Pain in right leg: Secondary | ICD-10-CM | POA: Insufficient documentation

## 2014-11-15 DIAGNOSIS — I10 Essential (primary) hypertension: Secondary | ICD-10-CM

## 2014-11-15 DIAGNOSIS — I482 Chronic atrial fibrillation, unspecified: Secondary | ICD-10-CM

## 2014-11-15 DIAGNOSIS — I2510 Atherosclerotic heart disease of native coronary artery without angina pectoris: Secondary | ICD-10-CM | POA: Diagnosis not present

## 2014-11-15 DIAGNOSIS — Z950 Presence of cardiac pacemaker: Secondary | ICD-10-CM | POA: Diagnosis not present

## 2014-11-15 DIAGNOSIS — Z952 Presence of prosthetic heart valve: Secondary | ICD-10-CM

## 2014-11-15 DIAGNOSIS — Z954 Presence of other heart-valve replacement: Secondary | ICD-10-CM

## 2014-11-15 LAB — CUP PACEART INCLINIC DEVICE CHECK
Battery Impedance: 174 Ohm
Battery Voltage: 2.78 V
Brady Statistic RV Percent Paced: 86 %
Date Time Interrogation Session: 20160906091411
Lead Channel Impedance Value: 455 Ohm
Lead Channel Impedance Value: 67 Ohm
Lead Channel Pacing Threshold Pulse Width: 0.4 ms
Lead Channel Sensing Intrinsic Amplitude: 15.67 mV
MDC IDC MSMT BATTERY REMAINING LONGEVITY: 112 mo
MDC IDC MSMT LEADCHNL RV PACING THRESHOLD AMPLITUDE: 0.5 V
MDC IDC SET LEADCHNL RV PACING AMPLITUDE: 2.5 V
MDC IDC SET LEADCHNL RV PACING PULSEWIDTH: 0.4 ms
MDC IDC SET LEADCHNL RV SENSING SENSITIVITY: 4 mV

## 2014-11-15 NOTE — Assessment & Plan Note (Signed)
His Medtronic PPM is programmed VVI at 70. Normal function. Will recheck in several months

## 2014-11-15 NOTE — Assessment & Plan Note (Signed)
His symptoms are really class 1. He has occaisional peripheral edema. I have encourage the patient to avoid sodium.

## 2014-11-15 NOTE — Assessment & Plan Note (Signed)
His ventricular rate is controlled. He is tolerating is systemic anti-coagulation

## 2014-11-15 NOTE — Progress Notes (Signed)
HPI [name] returns today for followup. He is a very pleasant 81 year old man with a history of aortic stenosis status post aortic valve replacement carried out approximately 2 years ago, which was complicated by the development of complete heart block for which the patient underwent insertion of a permanent pacemaker. In the interim he has been stable. He has class II heart failure symptoms. He denies chest pain or syncope. The patient also has chronic atrial fibrillation with a controlled ventricular response. 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 He notes rare peripheral edema. Allergies  Allergen Reactions  . Tape     Pulls skin off     Current Outpatient Prescriptions  Medication Sig Dispense Refill  . docusate sodium (COLACE) 100 MG capsule Take 100 mg by mouth 2 (two) times daily.    . ferrous sulfate 325 (65 FE) MG EC tablet Take 325 mg by mouth 2 (two) times daily.    . folic acid (FOLVITE) 1 MG tablet Take 1 mg by mouth daily.     . furosemide (LASIX) 20 MG tablet Take as needed 30 tablet   . gabapentin (NEURONTIN) 300 MG capsule Take 300 mg by mouth 2 (two) times daily.    Marland Kitchen HYDROcodone-acetaminophen (NORCO) 10-325 MG per tablet Take 1-2 tablets by mouth every 4 (four) hours as needed. (Patient taking differently: Take 1-2 tablets by mouth every 4 (four) hours as needed (pain). ) 100 tablet 0  . hydrocortisone cream 1 % Apply 1 application topically 2 (two) times daily as needed (for leg itching).    . Multiple Vitamins-Minerals (PRESERVISION AREDS 2) CAPS Take 1 tablet by mouth 2 (two) times daily.    Marland Kitchen omeprazole (PRILOSEC) 20 MG capsule Take 20 mg by mouth daily.    . predniSONE (DELTASONE) 5 MG tablet Take 1 tablet (5 mg total) by mouth daily with breakfast. 30 tablet 1  . rOPINIRole (REQUIP) 2 MG tablet Take 2 mg by mouth at bedtime.    . simvastatin (ZOCOR) 20 MG tablet Take 20 mg by mouth at bedtime.     Marland Kitchen warfarin (COUMADIN) 6 MG  tablet Take 1 tablet (6 mg total) by mouth every evening. 30 tablet 0   No current facility-administered medications for this visit.     Past Medical History  Diagnosis Date  . Hypertension   . Hyperlipidemia   . CAD (coronary artery disease)     a. 05/2004 CABGx4 (LIMA->LAD, VG->RI, VG->OM, VG->RCA);  b. 08/2012 Cath: Native 3VD with 3/4 patent grafts (VG->RI occluded).  . Atrial fibrillation   . Macular degeneration   . Aortic stenosis     a. 08/2012 TEE: EF 45-55%, critical AS, Triv AI, mild to mod MR, Sev TR;  b. 08/2012 AVR (21mm Magna Ease pericardial tissue valve) & TV Repair (30mm MC3 annuloplasty ring).  Marland Kitchen AAA (abdominal aortic aneurysm)     a. 06/2010 s/p Endovascular AAA Repair  . 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 Pain     PT HAS A LOT OF LOWER BACK PAIN- PREVIOUS BACK SURGERY AND DJD  . Pacemaker   . Shortness of breath     MOSTLY WITH EXERTION - IMPROVED SINCE HEART VALVE REPLACEMENT  . Arthritis     LEFT KNEE OA AND PAIN;  DJD LUMBAR  . Anemia requiring transfusions     MOST RECENT WAS JUNE 2014 WITH HEART SURGERY  . Complication of  anesthesia     STATES HE DOES WELL WITH DIPROVAN    ROS:   All systems reviewed and negative except as noted in the HPI.   Past Surgical History  Procedure Laterality Date  . Endovascular stent insertion  07/05/2010  . Appendectomy    . Coronary artery bypass graft    . Vascular surgery    . Back surgery    . Cardiac catheterization    . Esophagogastroduodenoscopy  06/19/2011    Procedure: ESOPHAGOGASTRODUODENOSCOPY (EGD);  Surgeon: Graylin Shiver, MD;  Location: [facility] OR;  Service: Gastroenterology;  Laterality: N/A;  . Colonoscopy  06/19/2011    Procedure: COLONOSCOPY;  Surgeon: Graylin Shiver, MD;  Location: [facility] OR;  Service: Gastroenterology;  Laterality: N/A;  . Eye surgery      bilater cataracts removed  . Aortic valve replacement N/A 09/01/2012    Procedure: REDO AORTIC VALVE  REPLACEMENT (AVR);  Surgeon: Kerin Perna, MD;  Location: [facility] OR;  Service: Open Heart Surgery;  Laterality: N/A;  . Tricuspid valve replacement N/A 09/01/2012    Procedure: TRICUSPID VALVE REPAIR;  Surgeon: Kerin Perna, MD;  Location: [facility] OR;  Service: Open Heart Surgery;  Laterality: N/A;  . Intraoperative transesophageal echocardiogram N/A 09/01/2012    Procedure: INTRAOPERATIVE TRANSESOPHAGEAL ECHOCARDIOGRAM;  Surgeon: Kerin Perna, MD;  Location: [facility] OR;  Service: Open Heart Surgery;  Laterality: N/A;  . Pacemaker insertion  09/07/2012    Medtronic Sensia single chamber pacemaker implanted by Dr Ladona Ridgel   . Surgery about 50 yrs ago for perforated ulcer and repair of hiatal hernia    . Total knee arthroplasty Left 02/15/2013    Procedure: LEFT TOTAL KNEE ARTHROPLASTY;  Surgeon: Shelda Pal, MD;  Location: [facility] ORS;  Service: Orthopedics;  Laterality: Left;  . Permanent pacemaker insertion N/A 09/07/2012    Procedure: PERMANENT PACEMAKER INSERTION;  Surgeon: Marinus Maw, MD;  Location: [facility] CATH LAB;  Service: Cardiovascular;  Laterality: N/A;     Family History  Problem Relation Age of Onset  . Coronary artery disease Brother      Social History   Social History  . Marital Status: Married    Spouse Name: N/A  . Number of Children: N/A  . Years of Education: N/A   Occupational History  . Not on file.   Social History Main Topics  . Smoking status: Former Smoker    Quit date: 10/29/1975  . Smokeless tobacco: Never Used  . Alcohol Use: No  . Drug Use: No  . Sexual Activity: Not on file   Other Topics Concern  . Not on file   Social History Narrative   Pt lives in [place] with his wife.  He receives most of his medical care in [place] and stays with his dtr here when necessary.     There were no vitals taken for this visit.  Physical Exam:  Well appearing elderly man, NAD HEENT: Unremarkable Neck:  7 cm JVD, no thyromegally Back:  No CVA tenderness Lungs:  Clear with  no wheezes, rales, or rhonchi. HEART:  IRegular rate rhythm, 2/6 systolic murmur at the right lower sternal border Abd:  soft, positive bowel sounds, no organomegally, no rebound, no guarding Ext:  2 plus pulses, no edema, no cyanosis, no clubbing Skin:  No rashes no nodules Neuro:  CN II through XII intact, motor grossly intact   DEVICE  Normal device function.  See PaceArt for details. Device in the VVI mode   Assess/Plan:

## 2014-11-15 NOTE — Patient Instructions (Signed)
Medication Instructions:  Your physician recommends that you continue on your current medications as directed. Please refer to the Current Medication list given to you today.   Labwork: None ordered  Testing/Procedures: None ordered  Follow-Up: Your physician wants you to follow-up in: 12 months with Dr Court Joy will receive a reminder letter in the mail two months in advance. If you don't receive a letter, please call our office to schedule the follow-up appointment.  Remote monitoring is used to monitor your Pacemaker of ICD from home. This monitoring reduces the number of office visits required to check your device to one time per year. It allows [place] to keep an eye on the functioning of your device to ensure it is working properly. You are scheduled for a device check from home on 02/14/15. You may send your transmission at any time that day. If you have a wireless device, the transmission will be sent automatically. After your physician reviews your transmission, you will receive a postcard with your next transmission date.    Any Other Special Instructions Will Be Listed Below (If Applicable).

## 2014-11-15 NOTE — Patient Instructions (Signed)
Medication Instructions:  Your physician recommends that you continue on your current medications as directed. Please refer to the Current Medication list given to you today.   Labwork: None ordered   Testing/Procedures: Non ordered   Follow-Up: Your physician wants you to follow-up in: 1 year with Dr.Smith You will receive a reminder letter in the mail two months in advance. If you don't receive a letter, please call our office to schedule the follow-up appointment.   Any Other Special Instructions Will Be Listed Below (If Applicable).  Your physician recommends that you continue on your current medications as directed. Please refer to the Current Medication list given to you today.

## 2015-02-14 ENCOUNTER — Ambulatory Visit (INDEPENDENT_AMBULATORY_CARE_PROVIDER_SITE_OTHER): Payer: Medicare PPO | Admitting: *Deleted

## 2015-02-14 ENCOUNTER — Encounter: Payer: Self-pay | Admitting: Cardiology

## 2015-02-14 ENCOUNTER — Telehealth: Payer: Self-pay | Admitting: Cardiology

## 2015-02-14 DIAGNOSIS — I442 Atrioventricular block, complete: Secondary | ICD-10-CM

## 2015-02-14 NOTE — Telephone Encounter (Signed)
Spoke with pt and reminded pt of remote transmission that is due today. Pt verbalized understanding.   

## 2015-02-14 NOTE — Progress Notes (Signed)
Remote pacemaker transmission.   

## 2015-02-17 ENCOUNTER — Telehealth: Payer: Self-pay | Admitting: Interventional Cardiology

## 2015-02-17 NOTE — Telephone Encounter (Signed)
Returned call.  Pt's daughter stated that the pt has had sob for about the last month, shortly after seeing Dr Katrinka BlazingSmith.  Also has had some b/l arm pain and jerking motions.  PCP recommended that he see his cardiologist asap.  Pt lives in [place], but will be at daughters home  this weekend and into next week.  Requesting to see Dr Katrinka BlazingSmith at first of next week.  Will be worked in with Dr Katrinka BlazingSmith on Tuesday afternoon.

## 2015-02-17 NOTE — Telephone Encounter (Signed)
New problem   Pt's daughter stated pt's arms is hurting and jerking, pt is in [place] is [place] 3hr drive and need to come in office to be seen asap per his PCP. Please call pt's daughter to advise.

## 2015-02-21 ENCOUNTER — Encounter: Payer: Self-pay | Admitting: Interventional Cardiology

## 2015-02-21 ENCOUNTER — Ambulatory Visit (INDEPENDENT_AMBULATORY_CARE_PROVIDER_SITE_OTHER): Payer: Medicare PPO | Admitting: Interventional Cardiology

## 2015-02-21 VITALS — BP 100/56 | HR 71 | Ht 70.0 in | Wt 172.6 lb

## 2015-02-21 DIAGNOSIS — K4090 Unilateral inguinal hernia, without obstruction or gangrene, not specified as recurrent: Secondary | ICD-10-CM

## 2015-02-21 DIAGNOSIS — R0600 Dyspnea, unspecified: Secondary | ICD-10-CM

## 2015-02-21 DIAGNOSIS — R0609 Other forms of dyspnea: Secondary | ICD-10-CM

## 2015-02-21 DIAGNOSIS — I442 Atrioventricular block, complete: Secondary | ICD-10-CM

## 2015-02-21 DIAGNOSIS — Z954 Presence of other heart-valve replacement: Secondary | ICD-10-CM

## 2015-02-21 DIAGNOSIS — Z950 Presence of cardiac pacemaker: Secondary | ICD-10-CM

## 2015-02-21 DIAGNOSIS — I2510 Atherosclerotic heart disease of native coronary artery without angina pectoris: Secondary | ICD-10-CM | POA: Diagnosis not present

## 2015-02-21 DIAGNOSIS — Z952 Presence of prosthetic heart valve: Secondary | ICD-10-CM

## 2015-02-21 DIAGNOSIS — I482 Chronic atrial fibrillation, unspecified: Secondary | ICD-10-CM

## 2015-02-21 DIAGNOSIS — I340 Nonrheumatic mitral (valve) insufficiency: Secondary | ICD-10-CM

## 2015-02-21 DIAGNOSIS — Z7901 Long term (current) use of anticoagulants: Secondary | ICD-10-CM

## 2015-02-21 LAB — BASIC METABOLIC PANEL
BUN: 33 mg/dL — AB (ref 7–25)
CO2: 23 mmol/L (ref 20–31)
CREATININE: 1.76 mg/dL — AB (ref 0.70–1.11)
Calcium: 8.1 mg/dL — ABNORMAL LOW (ref 8.6–10.3)
Chloride: 107 mmol/L (ref 98–110)
GLUCOSE: 120 mg/dL — AB (ref 65–99)
POTASSIUM: 4.7 mmol/L (ref 3.5–5.3)
Sodium: 140 mmol/L (ref 135–146)

## 2015-02-21 NOTE — Progress Notes (Addendum)
Cardiology Office Note   Date:  02/21/2015   ID:  CARDALE DORER, DOB [birth year], MRN [id number]  PCP:  PROVIDER NOT IN SYSTEM  Cardiologist:  Lesleigh Noe, MD   Chief Complaint  Patient presents with  . Shortness of Breath      History of Present Illness: Ethan Lawrence is a 81 y.o. male who presents for  Coronary artery disease, CABG 4 in 2006, chronic atrial fibrillation, permanent pacemaker for high-grade AV block, aortic valve replacement with a tissue prosthesis following aortic valve surgery, an chronic anticoagulation therapy.   Over the past several weeks he has noted increasing exertional dyspnea. This is different than at our last office visit.It has developed over the past 3 months. He has not had syncope. He is also having right inguinal discomfort. He feels a bulge in the inguinal canal. He denies orthopnea and PND. He has had mild lower extremity swelling, pain radiating from the inguinal region to his back. Able to get on the exam table without problems. He still lives independently. He denies chest discomfort.  States there has been chills and fever to 102.9 degrees F.  Past Medical History  Diagnosis Date  . Hypertension   . Hyperlipidemia   . CAD (coronary artery disease)     a. 05/2004 CABGx4 (LIMA->LAD, VG->RI, VG->OM, VG->RCA);  b. 08/2012 Cath: Native 3VD with 3/4 patent grafts (VG->RI occluded).  . Atrial fibrillation (HCC)   . Macular degeneration   . Aortic stenosis     a. 08/2012 TEE: EF 45-55%, critical AS, Triv AI, mild to mod MR, Sev TR;  b. 08/2012 AVR (21mm Magna Ease pericardial tissue valve) & TV Repair (30mm MC3 annuloplasty ring).  Marland Kitchen AAA (abdominal aortic aneurysm) (HCC)     a. 06/2010 s/p Endovascular AAA Repair  . 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 Pain     PT HAS A LOT OF LOWER BACK PAIN- PREVIOUS BACK SURGERY AND DJD  . Pacemaker   . Shortness of breath    MOSTLY WITH EXERTION - IMPROVED SINCE HEART VALVE REPLACEMENT  . Arthritis     LEFT KNEE OA AND PAIN;  DJD LUMBAR  . Anemia requiring transfusions     MOST RECENT WAS JUNE 2014 WITH HEART SURGERY  . Complication of anesthesia     STATES HE DOES WELL WITH DIPROVAN    Past Surgical History  Procedure Laterality Date  . Endovascular stent insertion  07/05/2010  . Appendectomy    . Coronary artery bypass graft    . Vascular surgery    . Back surgery    . Cardiac catheterization    . Esophagogastroduodenoscopy  06/19/2011    Procedure: ESOPHAGOGASTRODUODENOSCOPY (EGD);  Surgeon: Graylin Shiver, MD;  Location: [facility] OR;  Service: Gastroenterology;  Laterality: N/A;  . Colonoscopy  06/19/2011    Procedure: COLONOSCOPY;  Surgeon: Graylin Shiver, MD;  Location: [facility] OR;  Service: Gastroenterology;  Laterality: N/A;  . Eye surgery      bilater cataracts removed  . Aortic valve replacement N/A 09/01/2012    Procedure: REDO AORTIC VALVE REPLACEMENT (AVR);  Surgeon: Kerin Perna, MD;  Location: [facility] OR;  Service: Open Heart Surgery;  Laterality: N/A;  . Tricuspid valve replacement N/A 09/01/2012    Procedure: TRICUSPID VALVE REPAIR;  Surgeon: Kerin Perna, MD;  Location: [facility] OR;  Service: Open Heart Surgery;  Laterality: N/A;  . Intraoperative transesophageal  echocardiogram N/A 09/01/2012    Procedure: INTRAOPERATIVE TRANSESOPHAGEAL ECHOCARDIOGRAM;  Surgeon: Kerin PernaPeter Van Trigt, MD;  [facility];  Service: Open Heart Surgery;  Laterality: N/A;  . Pacemaker insertion  09/07/2012    Medtronic Sensia single chamber pacemaker implanted by Dr Ladona Ridgelaylor   . Surgery about 50 yrs ago for perforated ulcer and repair of hiatal hernia    . Total knee arthroplasty Left 02/15/2013    Procedure: LEFT TOTAL KNEE ARTHROPLASTY;  Surgeon: Shelda PalMatthew D Olin, MD;  Location: [facility] ORS;  Service: Orthopedics;  Laterality: Left;  . Permanent pacemaker insertion N/A 09/07/2012    Procedure: PERMANENT PACEMAKER INSERTION;  Surgeon: Marinus MawGregg W  Taylor, MD;  [facility];  Service: Cardiovascular;  Laterality: N/A;     Current Outpatient Prescriptions  Medication Sig Dispense Refill  . docusate sodium (COLACE) 100 MG capsule Take 100 mg by mouth 2 (two) times daily.    . folic acid (FOLVITE) 1 MG tablet Take 1 mg by mouth daily.     . furosemide (LASIX) 20 MG tablet Take 20 mg by mouth daily as needed (swelling).    . gabapentin (NEURONTIN) 300 MG capsule Take 300 mg by mouth 2 (two) times daily.    Marland Kitchen. HYDROcodone-acetaminophen (NORCO) 10-325 MG tablet Take 1-2 tablets by mouth every 4 (four) hours as needed. (PAIN)    . hydrocortisone cream 1 % Apply 1 application topically 2 (two) times daily as needed (for leg itching).    . Multiple Vitamins-Minerals (PRESERVISION AREDS 2) CAPS Take 1 tablet by mouth 2 (two) times daily.    Marland Kitchen. omeprazole (PRILOSEC) 20 MG capsule Take 20 mg by mouth daily.    . predniSONE (DELTASONE) 5 MG tablet Take 1 tablet (5 mg total) by mouth daily with breakfast. 30 tablet 1  . rOPINIRole (REQUIP) 2 MG tablet Take 2 mg by mouth at bedtime.    . simvastatin (ZOCOR) 20 MG tablet Take 20 mg by mouth at bedtime.     . traMADol (ULTRAM) 50 MG tablet Take 25 mg by mouth daily.    Marland Kitchen. warfarin (COUMADIN) 6 MG tablet Take 1 tablet (6 mg total) by mouth every evening. 30 tablet 0   No current facility-administered medications for this visit.    Allergies:   Tape    Social History:  The patient  reports that he quit smoking about 39 years ago. He has never used smokeless tobacco. He reports that he does not drink alcohol or use illicit drugs.   Family History:  The patient's family history includes Alzheimer's disease in his brother; Coronary artery disease in his brother; Emphysema in his father; Healthy in his mother and sister.    ROS:  Please see the history of present illness.   Otherwise, review of systems are positive for  Inguinal discomfort on the right side. Back discomfort. Unexplained weight  gain and decreased appetite. Excessive fatigue. Had a fever one night last week. Occasional constipation..   All other systems are reviewed and negative.    PHYSICAL EXAM: VS:  BP 100/56 mmHg  Pulse 71  Ht 5\' 10"  (1.778 m)  Wt 172 lb 9.6 oz (78.291 kg)  BMI 24.77 kg/m2  SpO2 96% , BMI Body mass index is 24.77 kg/(m^2). GEN: Well nourished, well developed, in no acute distress HEENT: normal Neck: no JVD, carotid bruits, or masses Cardiac: IIRR.  There is  An apical 2-3 of 66 holosystolic and a right upper sternal border 1 to 2/6 crescendo decrescendo systolic  murmur.  No rub, or gallop. There is  Trace to 1+ bilateral ankle edema. Respiratory:  clear to auscultation bilaterally, normal work of breathing. GI: soft, nontender, nondistended, + BS. Probable right inguinal hernia that noted by a bulge in the inguinal canal when standing. MS: no deformity or atrophy Skin: warm and dry, no rash Neuro:  Strength and sensation are intact Psych: euthymic mood, full affect   EKG:  EKG is ordered today. The ekg reveals   Atrial fibrillation with intermittent ventricular pacing.   Recent Labs: No results found for requested labs within last 365 days.    Lipid Panel No results found for: CHOL, TRIG, HDL, CHOLHDL, VLDL, LDLCALC, LDLDIRECT    Wt Readings from Last 3 Encounters:  02/21/15 172 lb 9.6 oz (78.291 kg)  11/15/14 168 lb 1.9 oz (76.259 kg)  11/15/14 169 lb (76.658 kg)      Other studies Reviewed: Additional studies/ records that were reviewed today include:  Review of pacemaker follow-up.. The findings include  No recent laboratory data.    ASSESSMENT AND PLAN:  1. AV block, complete (HCC)  normally functioning pacemaker  2. Pacemaker  recently evaluated but report is not yet in the electronic chart  3. S/P AVR  there is an apical systolic murmur compatible with mitral regurgitation. This is relatively new.  4. Coronary artery disease involving native coronary artery  of native heart without angina pectoris  denies angina  5. Chronic a-fib on Coumadin  no obvious bleeding but anemia needs to be excluded in light of the patient's problem #6 below  6. Dyspnea on exertion  rule out anemia. Will also get a BNP to help exclude evidence of wet lungs.   7. Mitral regurgitation  heard on cardiac auscultation   8. Probable right inguinal hernia  9. Fever in setting of aortic prosthesis, R/O endocarditis.  Current medicines are reviewed at length with the patient today.  The patient has the following concerns regarding medicines:  None. It seems as though he has been taking his diuretic as needed..  The following changes/actions have been instituted:    Basic metabolic panel, BNP, an CBC  2-D Doppler echocardiogram to rule out mitral regurgitation and bioprosthetic aortic valve dysfunction/vegetation  May simply need daily diuresis with furosemide. We'll get an echocardiogram and other data noted above to help make decision.  May need general medicine evaluation.  Labs/ tests ordered today include:   Orders Placed This Encounter  Procedures  . Basic metabolic panel  . B Nat Peptide  . CBC w/Diff  . Echocardiogram     Disposition:   FU with HS in PRN   Signed, Lesleigh Noe, MD  02/21/2015 5:23 PM    [facility] [address] [place], [place], [place]  [address] Phone: [phone]; Fax: [phone]

## 2015-02-21 NOTE — Patient Instructions (Signed)
Medication Instructions:  Your physician recommends that you continue on your current medications as directed. Please refer to the Current Medication list given to you today.   Labwork: Bmet, Bnp, Cbc today  Testing/Procedures: Your physician has requested that you have an echocardiogram. Echocardiography is a painless test that uses sound waves to create images of your heart. It provides your doctor with information about the size and shape of your heart and how well your heart's chambers and valves are working. This procedure takes approximately one hour. There are no restrictions for this procedure.   Follow-Up: Your physician recommends that you schedule a follow-up appointment pending test results  Any Other Special Instructions Will Be Listed Below (If Applicable).     If you need a refill on your cardiac medications before your next appointment, please call your pharmacy.

## 2015-02-22 LAB — CBC WITH DIFFERENTIAL/PLATELET
BASOS PCT: 0 % (ref 0–1)
Basophils Absolute: 0 10*3/uL (ref 0.0–0.1)
EOS PCT: 1 % (ref 0–5)
Eosinophils Absolute: 0 10*3/uL (ref 0.0–0.7)
HCT: 31.5 % — ABNORMAL LOW (ref 39.0–52.0)
HEMOGLOBIN: 9.9 g/dL — AB (ref 13.0–17.0)
Lymphocytes Relative: 17 % (ref 12–46)
Lymphs Abs: 0.7 10*3/uL (ref 0.7–4.0)
MCH: 28.9 pg (ref 26.0–34.0)
MCHC: 31.4 g/dL (ref 30.0–36.0)
MCV: 92.1 fL (ref 78.0–100.0)
MONO ABS: 0.6 10*3/uL (ref 0.1–1.0)
MPV: 10 fL (ref 8.6–12.4)
Monocytes Relative: 14 % — ABNORMAL HIGH (ref 3–12)
NEUTROS ABS: 2.9 10*3/uL (ref 1.7–7.7)
Neutrophils Relative %: 68 % (ref 43–77)
Platelets: 99 10*3/uL — ABNORMAL LOW (ref 150–400)
RBC: 3.42 MIL/uL — ABNORMAL LOW (ref 4.22–5.81)
RDW: 14.2 % (ref 11.5–15.5)
WBC: 4.2 10*3/uL (ref 4.0–10.5)

## 2015-02-22 LAB — CUP PACEART REMOTE DEVICE CHECK
Brady Statistic RV Percent Paced: 88 %
Date Time Interrogation Session: 20161206154941
Lead Channel Impedance Value: 438 Ohm
Lead Channel Impedance Value: 67 Ohm
MDC IDC LEAD IMPLANT DT: 20140630
MDC IDC LEAD LOCATION: 753860
MDC IDC MSMT BATTERY IMPEDANCE: 223 Ohm
MDC IDC MSMT BATTERY REMAINING LONGEVITY: 104 mo
MDC IDC MSMT BATTERY VOLTAGE: 2.79 V
MDC IDC SET LEADCHNL RV PACING AMPLITUDE: 2.5 V
MDC IDC SET LEADCHNL RV PACING PULSEWIDTH: 0.4 ms
MDC IDC SET LEADCHNL RV SENSING SENSITIVITY: 4 mV

## 2015-02-22 LAB — BRAIN NATRIURETIC PEPTIDE: BRAIN NATRIURETIC PEPTIDE: 279.6 pg/mL — AB (ref 0.0–100.0)

## 2015-02-23 ENCOUNTER — Ambulatory Visit (INDEPENDENT_AMBULATORY_CARE_PROVIDER_SITE_OTHER): Payer: Medicare PPO | Admitting: Emergency Medicine

## 2015-02-23 ENCOUNTER — Other Ambulatory Visit: Payer: Self-pay

## 2015-02-23 ENCOUNTER — Ambulatory Visit (HOSPITAL_BASED_OUTPATIENT_CLINIC_OR_DEPARTMENT_OTHER): Payer: Medicare PPO

## 2015-02-23 VITALS — BP 118/62 | HR 84 | Temp 97.7°F | Resp 16 | Ht 69.0 in | Wt 171.2 lb

## 2015-02-23 DIAGNOSIS — I340 Nonrheumatic mitral (valve) insufficiency: Secondary | ICD-10-CM | POA: Diagnosis not present

## 2015-02-23 DIAGNOSIS — I059 Rheumatic mitral valve disease, unspecified: Secondary | ICD-10-CM

## 2015-02-23 DIAGNOSIS — K4090 Unilateral inguinal hernia, without obstruction or gangrene, not specified as recurrent: Secondary | ICD-10-CM | POA: Diagnosis not present

## 2015-02-23 DIAGNOSIS — R509 Fever, unspecified: Secondary | ICD-10-CM

## 2015-02-23 DIAGNOSIS — Z952 Presence of prosthetic heart valve: Secondary | ICD-10-CM

## 2015-02-23 DIAGNOSIS — I517 Cardiomegaly: Secondary | ICD-10-CM | POA: Insufficient documentation

## 2015-02-23 LAB — POCT SEDIMENTATION RATE: POCT SED RATE: 63 mm/hr — AB (ref 0–22)

## 2015-02-23 LAB — POCT URINALYSIS DIP (MANUAL ENTRY)
BILIRUBIN UA: NEGATIVE
BILIRUBIN UA: NEGATIVE
GLUCOSE UA: NEGATIVE
Leukocytes, UA: NEGATIVE
Nitrite, UA: NEGATIVE
Protein Ur, POC: 100 — AB
RBC UA: NEGATIVE
SPEC GRAV UA: 1.025
Urobilinogen, UA: 2
pH, UA: 5.5

## 2015-02-23 LAB — POCT CBC
GRANULOCYTE PERCENT: 83.4 % — AB (ref 37–80)
HCT, POC: 31.7 % — AB (ref 43.5–53.7)
Hemoglobin: 10.4 g/dL — AB (ref 14.1–18.1)
Lymph, poc: 0.7 (ref 0.6–3.4)
MCH: 29.2 pg (ref 27–31.2)
MCHC: 33 g/dL (ref 31.8–35.4)
MCV: 88.7 fL (ref 80–97)
MID (cbc): 0.1 (ref 0–0.9)
MPV: 7.2 fL (ref 0–99.8)
POC GRANULOCYTE: 4.3 (ref 2–6.9)
POC LYMPH PERCENT: 14 %L (ref 10–50)
POC MID %: 2.6 % (ref 0–12)
Platelet Count, POC: 99 10*3/uL — AB (ref 142–424)
RBC: 3.57 M/uL — AB (ref 4.69–6.13)
RDW, POC: 14.9 %
WBC: 5.1 10*3/uL (ref 4.6–10.2)

## 2015-02-23 LAB — POC MICROSCOPIC URINALYSIS (UMFC): Mucus: ABSENT

## 2015-02-23 NOTE — Progress Notes (Signed)
Subjective:  Patient ID: Ethan Lawrence, male    DOB: Jan 31, 1923  Age: 81 y.o. MRN: [id number]  CC: Hernia   HPI Ethan Lawrence presents   Patient has had  Pain and swelling in his right groin over the last several weeks. Last 48 hours he had increased pain and tenderness. Denies any history of injury. He denies any dysuria urgency or frequency. Denies any cough wheezing or shortness of breath . 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 He's come down here due to the concern that he has a hernia and a feeling that he needs to have surgery down in this area rather than up in a small town [place] where he lives.    He has a recent fever up to  102.5 at night. Last night fever was less. He was seen by his cardiologist yesterday an had an echocardiogram but he does note reported that he has a prosthetic heart valve and they're concerned about possible valve infection.   History Ethan Lawrence has a past medical history of Hypertension; Hyperlipidemia; CAD (coronary artery disease); Atrial fibrillation (HCC); Macular degeneration; Aortic stenosis; AAA (abdominal aortic aneurysm) (HCC); Complete heart block, post-surgical (HCC) (08/2012); Pain; Pacemaker; Shortness of breath; Arthritis; Anemia requiring transfusions; Complication of anesthesia; Blood transfusion without reported diagnosis; Cataract; and CHF (congestive heart failure) (HCC).   He has past surgical history that includes Endovascular stent insertion (07/05/2010); Appendectomy; Coronary artery bypass graft; Vascular surgery; Back surgery; Cardiac catheterization; Esophagogastroduodenoscopy (06/19/2011); Colonoscopy (06/19/2011); Eye surgery; Aortic valve replacement (N/A, 09/01/2012); Tricuspid valve replacement (N/A, 09/01/2012); Intraoprative transesophageal echocardiogram (N/A, 09/01/2012); Pacemaker insertion (09/07/2012); SURGERY ABOUT 50 YRS AGO FOR PERFORATED ULCER AND REPAIR OF HIATAL HERNIA; Total knee  arthroplasty (Left, 02/15/2013); permanent pacemaker insertion (N/A, 09/07/2012); Hernia repair; Joint replacement; Spine surgery; Cardiac valve replacement; and Prostate surgery.   His  family history includes Alzheimer's disease in his brother; Coronary artery disease in his brother; Emphysema in his father; Healthy in his mother and sister.  He   reports that he quit smoking about 39 years ago. He has never used smokeless tobacco. He reports that he does not drink alcohol or use illicit drugs.  Outpatient Prescriptions Prior to Visit  Medication Sig Dispense Refill  . docusate sodium (COLACE) 100 MG capsule Take 100 mg by mouth 2 (two) times daily.    . folic acid (FOLVITE) 1 MG tablet Take 1 mg by mouth daily.     . furosemide (LASIX) 20 MG tablet Take 20 mg by mouth daily as needed (swelling).    . gabapentin (NEURONTIN) 300 MG capsule Take 300 mg by mouth 2 (two) times daily.    Marland Kitchen HYDROcodone-acetaminophen (NORCO) 10-325 MG tablet Take 1-2 tablets by mouth every 4 (four) hours as needed. (PAIN)    . hydrocortisone cream 1 % Apply 1 application topically 2 (two) times daily as needed (for leg itching).    Marland Kitchen omeprazole (PRILOSEC) 20 MG capsule Take 20 mg by mouth daily.    . predniSONE (DELTASONE) 5 MG tablet Take 1 tablet (5 mg total) by mouth daily with breakfast. 30 tablet 1  . rOPINIRole (REQUIP) 2 MG tablet Take 2 mg by mouth at bedtime.    . simvastatin (ZOCOR) 20 MG tablet Take 20 mg by mouth at bedtime.     . traMADol (ULTRAM) 50 MG tablet Take 25 mg by mouth daily.    Marland Kitchen warfarin (COUMADIN) 6 MG tablet Take 1 tablet (6  mg total) by mouth every evening. 30 tablet 0  . Multiple Vitamins-Minerals (PRESERVISION AREDS 2) CAPS Take 1 tablet by mouth 2 (two) times daily. Reported on 02/23/2015     No facility-administered medications prior to visit.    Social History   Social History  . Marital Status: Married    Spouse Name: N/A  . Number of Children: N/A  . Years of Education:  N/A   Social History Main Topics  . Smoking status: Former Smoker    Quit date: 10/29/1975  . Smokeless tobacco: Never Used  . Alcohol Use: No  . Drug Use: No  . Sexual Activity: Not Asked   Other Topics Concern  . None   Social History Narrative   Pt lives in [place] with his wife.  He receives most of his medical care in [place] and stays with his dtr here when necessary.     Review of Systems  Constitutional: Positive for fever. Negative for chills and appetite change.  HENT: Negative for congestion, ear pain, postnasal drip, sinus pressure and sore throat.   Eyes: Negative for pain and redness.  Respiratory: Negative for cough, shortness of breath and wheezing.   Cardiovascular: Negative for leg swelling.  Gastrointestinal: Negative for nausea, vomiting, abdominal pain, diarrhea, constipation and blood in stool.  Endocrine: Negative for polyuria.  Genitourinary: Negative for dysuria, urgency, frequency and flank pain.  Musculoskeletal: Negative for gait problem.  Skin: Negative for rash.  Neurological: Negative for weakness and headaches.  Psychiatric/Behavioral: Negative for confusion and decreased concentration. The patient is not nervous/anxious.     Objective:  BP 118/62 mmHg  Pulse 84  Temp(Src) 97.7 F (36.5 C) (Oral)  Resp 16  Ht  (1.753 m)  Wt 171 lb 3.2 oz (77.656 kg)  BMI 25.27 kg/m2  SpO2 90%  Physical Exam  Constitutional: He is oriented to person, place, and time. He appears well-developed and well-nourished. No distress.  HENT:  Head: Normocephalic and atraumatic.  Right Ear: External ear normal.  Left Ear: External ear normal.  Nose: Nose normal.  Eyes: Conjunctivae and EOM are normal. Pupils are equal, round, and reactive to light. No scleral icterus.  Neck: Normal range of motion. Neck supple. No tracheal deviation present.  Cardiovascular: Normal rate, regular rhythm and normal heart sounds.   Pulmonary/Chest: Effort normal. No respiratory  distress. He has no wheezes. He has no rales.  Abdominal: He exhibits no mass. There is no tenderness. There is no rebound and no guarding. A hernia is present. Hernia confirmed positive in the right inguinal area. Hernia confirmed negative in the left inguinal area.  Musculoskeletal: He exhibits no edema.  Lymphadenopathy:    He has no cervical adenopathy.  Neurological: He is alert and oriented to person, place, and time. Coordination normal.  Skin: Skin is warm and dry. No rash noted.  Psychiatric: He has a normal mood and affect. His behavior is normal.      Assessment & Plan:   Ethan Lawrence was seen today for hernia.  Diagnoses and all orders for this visit:  Unilateral inguinal hernia without obstruction or gangrene, recurrence not specified -     Ambulatory referral to General Surgery  Fever, unspecified fever cause -     POCT CBC -     POCT SEDIMENTATION RATE -     POCT urinalysis dipstick -     POCT Microscopic Urinalysis ([facility])   I am having Ethan Lawrence maintain his folic acid, simvastatin, omeprazole, gabapentin, PRESERVISION AREDS  2, docusate sodium, hydrocortisone cream, rOPINIRole, predniSONE, warfarin, furosemide, HYDROcodone-acetaminophen, and traMADol.  No orders of the defined types were placed in this encounter.    Appropriate red flag conditions were discussed with the patient as well as actions that should be taken.  Patient expressed his understanding.  Follow-up: No Follow-up on file.  Carmelina DaneAnderson, Jeffery S, MD   Results for orders placed or performed in visit on 02/23/15  POCT CBC  Result Value Ref Range   WBC 5.1 4.6 - 10.2 K/uL   Lymph, poc 0.7 0.6 - 3.4   POC LYMPH PERCENT 14.0 10 - 50 %L   MID (cbc) 0.1 0 - 0.9   POC MID % 2.6 0 - 12 %M   POC Granulocyte 4.3 2 - 6.9   Granulocyte percent 83.4 (A) 37 - 80 %G   RBC 3.57 (A) 4.69 - 6.13 M/uL   Hemoglobin 10.4 (A) 14.1 - 18.1 g/dL   HCT, POC 11.931.7 (A) 14.743.5 - 53.7 %   MCV 88.7 80 - 97 fL   MCH,  POC 29.2 27 - 31.2 pg   MCHC 33.0 31.8 - 35.4 g/dL   RDW, POC 82.914.9 %   Platelet Count, POC 99 (A) 142 - 424 K/uL   MPV 7.2 0 - 99.8 fL  POCT SEDIMENTATION RATE  Result Value Ref Range   POCT SED RATE 63 (A) 0 - 22 mm/hr  POCT urinalysis dipstick  Result Value Ref Range   Color, UA yellow yellow   Clarity, UA clear clear   Glucose, UA negative negative   Bilirubin, UA negative negative   Ketones, POC UA negative negative   Spec Grav, UA 1.025    Blood, UA negative negative   pH, UA 5.5    Protein Ur, POC =100 (A) negative   Urobilinogen, UA 2.0    Nitrite, UA Negative Negative   Leukocytes, UA Negative Negative  POCT Microscopic Urinalysis ([facility])  Result Value Ref Range   WBC,UR,HPF,POC None None WBC/hpf   RBC,UR,HPF,POC Few (A) None RBC/hpf   Bacteria None None, Too numerous to count   Mucus Absent Absent   Epithelial Cells, UR Per Microscopy Few (A) None, Too numerous to count cells/hpf

## 2015-02-24 ENCOUNTER — Telehealth: Payer: Self-pay | Admitting: *Deleted

## 2015-02-24 NOTE — Telephone Encounter (Signed)
-----   Message from Lyn RecordsHenry W Smith, MD sent at 02/23/2015  5:04 PM EST ----- The echo shows valve function to be stable and heart size and strength are normal.

## 2015-02-24 NOTE — Telephone Encounter (Signed)
Spoke with pt's daughter, sherry, and she has been made aware of pt's echo results.

## 2015-02-25 ENCOUNTER — Inpatient Hospital Stay (HOSPITAL_COMMUNITY)
Admission: EM | Admit: 2015-02-25 | Discharge: 2015-03-08 | DRG: 871 | Disposition: A | Discharge status: Skilled Nursing Facility | Payer: Medicare PPO | Attending: Internal Medicine | Admitting: Internal Medicine

## 2015-02-25 ENCOUNTER — Encounter (HOSPITAL_COMMUNITY): Payer: Self-pay

## 2015-02-25 ENCOUNTER — Emergency Department (HOSPITAL_COMMUNITY): Payer: Medicare PPO

## 2015-02-25 DIAGNOSIS — T826XXA Infection and inflammatory reaction due to cardiac valve prosthesis, initial encounter: Secondary | ICD-10-CM | POA: Diagnosis present

## 2015-02-25 DIAGNOSIS — M545 Low back pain, unspecified: Secondary | ICD-10-CM

## 2015-02-25 DIAGNOSIS — F419 Anxiety disorder, unspecified: Secondary | ICD-10-CM | POA: Diagnosis present

## 2015-02-25 DIAGNOSIS — F0390 Unspecified dementia without behavioral disturbance: Secondary | ICD-10-CM | POA: Diagnosis present

## 2015-02-25 DIAGNOSIS — R509 Fever, unspecified: Secondary | ICD-10-CM

## 2015-02-25 DIAGNOSIS — M4640 Discitis, unspecified, site unspecified: Secondary | ICD-10-CM | POA: Diagnosis present

## 2015-02-25 DIAGNOSIS — I130 Hypertensive heart and chronic kidney disease with heart failure and stage 1 through stage 4 chronic kidney disease, or unspecified chronic kidney disease: Secondary | ICD-10-CM | POA: Diagnosis present

## 2015-02-25 DIAGNOSIS — I38 Endocarditis, valve unspecified: Secondary | ICD-10-CM | POA: Insufficient documentation

## 2015-02-25 DIAGNOSIS — H9190 Unspecified hearing loss, unspecified ear: Secondary | ICD-10-CM | POA: Diagnosis present

## 2015-02-25 DIAGNOSIS — T827XXA Infection and inflammatory reaction due to other cardiac and vascular devices, implants and grafts, initial encounter: Secondary | ICD-10-CM | POA: Insufficient documentation

## 2015-02-25 DIAGNOSIS — I2510 Atherosclerotic heart disease of native coronary artery without angina pectoris: Secondary | ICD-10-CM | POA: Diagnosis present

## 2015-02-25 DIAGNOSIS — I339 Acute and subacute endocarditis, unspecified: Secondary | ICD-10-CM | POA: Diagnosis present

## 2015-02-25 DIAGNOSIS — K036 Deposits [accretions] on teeth: Secondary | ICD-10-CM | POA: Diagnosis present

## 2015-02-25 DIAGNOSIS — Z96652 Presence of left artificial knee joint: Secondary | ICD-10-CM | POA: Diagnosis present

## 2015-02-25 DIAGNOSIS — K0530 Chronic periodontitis, unspecified: Secondary | ICD-10-CM | POA: Diagnosis present

## 2015-02-25 DIAGNOSIS — Z79899 Other long term (current) drug therapy: Secondary | ICD-10-CM

## 2015-02-25 DIAGNOSIS — R6510 Systemic inflammatory response syndrome (SIRS) of non-infectious origin without acute organ dysfunction: Secondary | ICD-10-CM | POA: Insufficient documentation

## 2015-02-25 DIAGNOSIS — K060 Gingival recession: Secondary | ICD-10-CM | POA: Diagnosis present

## 2015-02-25 DIAGNOSIS — N183 Chronic kidney disease, stage 3 (moderate): Secondary | ICD-10-CM | POA: Diagnosis present

## 2015-02-25 DIAGNOSIS — K047 Periapical abscess without sinus: Secondary | ICD-10-CM | POA: Insufficient documentation

## 2015-02-25 DIAGNOSIS — Y831 Surgical operation with implant of artificial internal device as the cause of abnormal reaction of the patient, or of later complication, without mention of misadventure at the time of the procedure: Secondary | ICD-10-CM | POA: Diagnosis present

## 2015-02-25 DIAGNOSIS — I5032 Chronic diastolic (congestive) heart failure: Secondary | ICD-10-CM | POA: Diagnosis present

## 2015-02-25 DIAGNOSIS — A408 Other streptococcal sepsis: Secondary | ICD-10-CM | POA: Diagnosis not present

## 2015-02-25 DIAGNOSIS — Z87891 Personal history of nicotine dependence: Secondary | ICD-10-CM

## 2015-02-25 DIAGNOSIS — Z7901 Long term (current) use of anticoagulants: Secondary | ICD-10-CM

## 2015-02-25 DIAGNOSIS — I5033 Acute on chronic diastolic (congestive) heart failure: Secondary | ICD-10-CM | POA: Diagnosis present

## 2015-02-25 DIAGNOSIS — D649 Anemia, unspecified: Secondary | ICD-10-CM | POA: Diagnosis present

## 2015-02-25 DIAGNOSIS — M549 Dorsalgia, unspecified: Secondary | ICD-10-CM

## 2015-02-25 DIAGNOSIS — A491 Streptococcal infection, unspecified site: Secondary | ICD-10-CM | POA: Diagnosis present

## 2015-02-25 DIAGNOSIS — Z7952 Long term (current) use of systemic steroids: Secondary | ICD-10-CM

## 2015-02-25 DIAGNOSIS — K4090 Unilateral inguinal hernia, without obstruction or gangrene, not specified as recurrent: Secondary | ICD-10-CM | POA: Diagnosis present

## 2015-02-25 DIAGNOSIS — T368X5A Adverse effect of other systemic antibiotics, initial encounter: Secondary | ICD-10-CM | POA: Diagnosis not present

## 2015-02-25 DIAGNOSIS — I482 Chronic atrial fibrillation, unspecified: Secondary | ICD-10-CM | POA: Diagnosis present

## 2015-02-25 DIAGNOSIS — I330 Acute and subacute infective endocarditis: Secondary | ICD-10-CM | POA: Diagnosis present

## 2015-02-25 DIAGNOSIS — E785 Hyperlipidemia, unspecified: Secondary | ICD-10-CM | POA: Diagnosis present

## 2015-02-25 DIAGNOSIS — R197 Diarrhea, unspecified: Secondary | ICD-10-CM | POA: Diagnosis not present

## 2015-02-25 DIAGNOSIS — Z951 Presence of aortocoronary bypass graft: Secondary | ICD-10-CM

## 2015-02-25 DIAGNOSIS — D696 Thrombocytopenia, unspecified: Secondary | ICD-10-CM | POA: Diagnosis present

## 2015-02-25 DIAGNOSIS — R7881 Bacteremia: Secondary | ICD-10-CM

## 2015-02-25 DIAGNOSIS — M4620 Osteomyelitis of vertebra, site unspecified: Secondary | ICD-10-CM | POA: Diagnosis present

## 2015-02-25 DIAGNOSIS — I10 Essential (primary) hypertension: Secondary | ICD-10-CM | POA: Diagnosis present

## 2015-02-25 DIAGNOSIS — Z950 Presence of cardiac pacemaker: Secondary | ICD-10-CM

## 2015-02-25 DIAGNOSIS — H3530 Unspecified macular degeneration: Secondary | ICD-10-CM | POA: Diagnosis present

## 2015-02-25 DIAGNOSIS — R52 Pain, unspecified: Secondary | ICD-10-CM

## 2015-02-25 LAB — CBC WITH DIFFERENTIAL/PLATELET
Basophils Absolute: 0 10*3/uL (ref 0.0–0.1)
Basophils Relative: 0 %
EOS ABS: 0 10*3/uL (ref 0.0–0.7)
Eosinophils Relative: 0 %
HCT: 29.3 % — ABNORMAL LOW (ref 39.0–52.0)
HEMOGLOBIN: 9.3 g/dL — AB (ref 13.0–17.0)
LYMPHS ABS: 0.6 10*3/uL — AB (ref 0.7–4.0)
Lymphocytes Relative: 12 %
MCH: 29.2 pg (ref 26.0–34.0)
MCHC: 31.7 g/dL (ref 30.0–36.0)
MCV: 91.8 fL (ref 78.0–100.0)
MONO ABS: 0.4 10*3/uL (ref 0.1–1.0)
MONOS PCT: 8 %
Neutro Abs: 4.3 10*3/uL (ref 1.7–7.7)
Neutrophils Relative %: 80 %
Platelets: 102 10*3/uL — ABNORMAL LOW (ref 150–400)
RBC: 3.19 MIL/uL — ABNORMAL LOW (ref 4.22–5.81)
RDW: 13.5 % (ref 11.5–15.5)
WBC: 5.4 10*3/uL (ref 4.0–10.5)

## 2015-02-25 LAB — COMPREHENSIVE METABOLIC PANEL
ALK PHOS: 86 U/L (ref 38–126)
ALT: 10 U/L — ABNORMAL LOW (ref 17–63)
ANION GAP: 8 (ref 5–15)
AST: 17 U/L (ref 15–41)
Albumin: 3.4 g/dL — ABNORMAL LOW (ref 3.5–5.0)
BUN: 32 mg/dL — ABNORMAL HIGH (ref 6–20)
CO2: 23 mmol/L (ref 22–32)
Calcium: 8.1 mg/dL — ABNORMAL LOW (ref 8.9–10.3)
Chloride: 108 mmol/L (ref 101–111)
Creatinine, Ser: 1.75 mg/dL — ABNORMAL HIGH (ref 0.61–1.24)
GFR calc non Af Amer: 33 mL/min — ABNORMAL LOW (ref >60)
GFR, EST AFRICAN AMERICAN: 38 mL/min — AB (ref >60)
Glucose, Bld: 118 mg/dL — ABNORMAL HIGH (ref 65–99)
POTASSIUM: 4.3 mmol/L (ref 3.5–5.1)
SODIUM: 139 mmol/L (ref 135–145)
TOTAL PROTEIN: 6.6 g/dL (ref 6.5–8.1)
Total Bilirubin: 0.8 mg/dL (ref 0.3–1.2)

## 2015-02-25 LAB — URINALYSIS, ROUTINE W REFLEX MICROSCOPIC
BILIRUBIN URINE: NEGATIVE
GLUCOSE, UA: NEGATIVE mg/dL
Ketones, ur: NEGATIVE mg/dL
Leukocytes, UA: NEGATIVE
Nitrite: NEGATIVE
PH: 6 (ref 5.0–8.0)
Protein, ur: 100 mg/dL — AB
SPECIFIC GRAVITY, URINE: 1.027 (ref 1.005–1.030)

## 2015-02-25 LAB — URINE MICROSCOPIC-ADD ON

## 2015-02-25 LAB — I-STAT CG4 LACTIC ACID, ED
Lactic Acid, Venous: 0.8 mmol/L (ref 0.5–2.0)
Lactic Acid, Venous: 1.15 mmol/L (ref 0.5–2.0)

## 2015-02-25 LAB — SEDIMENTATION RATE: Sed Rate: 58 mm/hr — ABNORMAL HIGH (ref 0–16)

## 2015-02-25 MED ORDER — IOHEXOL 300 MG/ML  SOLN
100.0000 mL | Freq: Once | INTRAMUSCULAR | Status: AC | PRN
  Administered 2015-02-25: 70 mL via INTRAVENOUS

## 2015-02-25 MED ORDER — ACETAMINOPHEN 325 MG PO TABS
650.0000 mg | ORAL_TABLET | Freq: Once | ORAL | Status: AC
Start: 2015-02-25 — End: 2015-02-25
  Administered 2015-02-25: 650 mg via ORAL
  Filled 2015-02-25: qty 2

## 2015-02-25 MED ORDER — FENTANYL CITRATE (PF) 100 MCG/2ML IJ SOLN
100.0000 ug | INTRAMUSCULAR | Status: AC | PRN
  Administered 2015-02-25 – 2015-02-26 (×4): 100 ug via INTRAVENOUS
  Filled 2015-02-25 (×4): qty 2

## 2015-02-25 MED ORDER — DEXTROSE 5 % IV SOLN
2.0000 g | Freq: Once | INTRAVENOUS | Status: AC
  Administered 2015-02-25: 2 g via INTRAVENOUS
  Filled 2015-02-25: qty 2

## 2015-02-25 MED ORDER — SODIUM CHLORIDE 0.9 % IV BOLUS (SEPSIS)
500.0000 mL | INTRAVENOUS | Status: AC
  Administered 2015-02-25: 500 mL via INTRAVENOUS

## 2015-02-25 MED ORDER — HYDROMORPHONE HCL 1 MG/ML IJ SOLN
0.5000 mg | Freq: Once | INTRAMUSCULAR | Status: AC
  Administered 2015-02-25: 0.5 mg via INTRAVENOUS
  Filled 2015-02-25: qty 1

## 2015-02-25 MED ORDER — SODIUM CHLORIDE 0.9 % IV BOLUS (SEPSIS): 500.0000 mL | Freq: Once | INTRAVENOUS | Status: DC

## 2015-02-25 MED ORDER — SODIUM CHLORIDE 0.9 % IV BOLUS (SEPSIS)
1000.0000 mL | INTRAVENOUS | Status: AC
  Administered 2015-02-25 (×2): 1000 mL via INTRAVENOUS

## 2015-02-25 MED ORDER — HYDROMORPHONE HCL 1 MG/ML IJ SOLN
0.5000 mg | Freq: Once | INTRAMUSCULAR | Status: DC | PRN
  Filled 2015-02-25: qty 1

## 2015-02-25 MED ORDER — HYDROMORPHONE HCL 1 MG/ML IJ SOLN: 1.0000 mg | Freq: Once | INTRAMUSCULAR | Status: DC

## 2015-02-25 MED ORDER — HYDROMORPHONE HCL 1 MG/ML IJ SOLN
0.5000 mg | Freq: Once | INTRAMUSCULAR | Status: AC
  Administered 2015-02-26: 0.5 mg via INTRAVENOUS

## 2015-02-25 MED ORDER — ONDANSETRON HCL 4 MG/2ML IJ SOLN
4.0000 mg | Freq: Once | INTRAMUSCULAR | Status: AC
  Administered 2015-02-25: 4 mg via INTRAVENOUS
  Filled 2015-02-25: qty 2

## 2015-02-25 MED ORDER — ACETAMINOPHEN 500 MG PO TABS
1000.0000 mg | ORAL_TABLET | Freq: Once | ORAL | Status: AC
  Administered 2015-02-25: 1000 mg via ORAL
  Filled 2015-02-25: qty 2

## 2015-02-25 NOTE — ED Notes (Signed)
Bed: [id number] Expected date:  Expected time:  Means of arrival:  Comments: 81 yr old hernia

## 2015-02-25 NOTE — ED Notes (Signed)
Per EMS pt c/o lower back pain since 1630, hx of chronic back pain, no injury noted

## 2015-02-25 NOTE — ED Notes (Signed)
Nurse drawing labs. 

## 2015-02-25 NOTE — ED Provider Notes (Signed)
CSN: [id number]     Arrival date & time 02/25/15  1933 History   First MD Initiated Contact with Patient 02/25/15 2002     Chief Complaint  Patient presents with  . Back Pain     (Consider location/radiation/quality/duration/timing/severity/associated sxs/prior Treatment) HPI   Ethan Lawrence is a 81 y.o. male, with a history of CAD, AAA, aortic stenosis, aortic and tricuspid bovine valve replacements, pacemaker, CHF, complete heart block post surgery, CABG presenting to the ED with lower back pain that began earlier today. Pain came on suddenly, rates it 10/10 with movement, 1/10 when sitting still, throbbing, nonradiating.  Decrease in appetite over the last few days. Pt last LP was 5-6 years ago. Pt denies N/V, chest pain, shortness of breath, neuro deficits, or any other pain or complaints.  Pt is down here from [place] for lower abdominal hernia repair in January 2017.  Pt is not diabetic, not an IV drug user, history of HIV. [name] daughter is at the bedside and states that she lives in the area.    Past Medical History  Diagnosis Date  . Hypertension   . Hyperlipidemia   . CAD (coronary artery disease)     a. 05/2004 CABGx4 (LIMA->LAD, VG->RI, VG->OM, VG->RCA);  b. 08/2012 Cath: Native 3VD with 3/4 patent grafts (VG->RI occluded).  . Atrial fibrillation (HCC)   . Macular degeneration   . Aortic stenosis     a. 08/2012 TEE: EF 45-55%, critical AS, Triv AI, mild to mod MR, Sev TR;  b. 08/2012 AVR (21mm Magna Ease pericardial tissue valve) & TV Repair (30mm MC3 annuloplasty ring).  Marland Kitchen. AAA (abdominal aortic aneurysm) (HCC)     a. 06/2010 s/p Endovascular AAA Repair  . 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 Pain     PT HAS A LOT OF LOWER BACK PAIN- PREVIOUS BACK SURGERY AND DJD  . Pacemaker   . Shortness of breath     MOSTLY WITH EXERTION - IMPROVED SINCE HEART VALVE REPLACEMENT  . Arthritis     LEFT KNEE OA AND  PAIN;  DJD LUMBAR  . Anemia requiring transfusions     MOST RECENT WAS JUNE 2014 WITH HEART SURGERY  . Complication of anesthesia     STATES HE DOES WELL WITH DIPROVAN  . Blood transfusion without reported diagnosis   . Cataract   . CHF (congestive heart failure) [facility](HCC)    Past Surgical History  Procedure Laterality Date  . Endovascular stent insertion  07/05/2010  . Appendectomy    . Coronary artery bypass graft    . Vascular surgery    . Back surgery    . Cardiac catheterization    . Esophagogastroduodenoscopy  06/19/2011    Procedure: ESOPHAGOGASTRODUODENOSCOPY (EGD);  Surgeon: Graylin ShiverSalem F Ganem, MD;  [facility];  Service: Gastroenterology;  Laterality: N/A;  . Colonoscopy  06/19/2011    Procedure: COLONOSCOPY;  Surgeon: Graylin ShiverSalem F Ganem, MD;  [facility];  Service: Gastroenterology;  Laterality: N/A;  . Eye surgery      bilater cataracts removed  . Aortic valve replacement N/A 09/01/2012    Procedure: REDO AORTIC VALVE REPLACEMENT (AVR);  Surgeon: Kerin PernaPeter Van Trigt, MD;  [facility];  Service: Open Heart Surgery;  Laterality: N/A;  . Tricuspid valve replacement N/A 09/01/2012    Procedure: TRICUSPID VALVE REPAIR;  Surgeon: Kerin PernaPeter Van Trigt, MD;  [facility];  Service: Open Heart Surgery;  Laterality:  N/A;  . Intraoperative transesophageal echocardiogram N/A 09/01/2012    Procedure: INTRAOPERATIVE TRANSESOPHAGEAL ECHOCARDIOGRAM;  Surgeon: Kerin Perna, MD;  Location[facility] OR;  Service: Open Heart Surgery;  Laterality: N/A;  . Pacemaker insertion  09/07/2012    Medtronic Sensia single chamber pacemaker implanted by Dr Ladona Ridgel   . Surgery about 50 yrs ago for perforated ulcer and repair of hiatal hernia    . Total knee arthroplasty Left 02/15/2013    Procedure: LEFT TOTAL KNEE ARTHROPLASTY;  Surgeon: Shelda Pal, MD;  Location: [facility] ORS;  Service: Orthopedics;  Laterality: Left;  . Permanent pacemaker insertion N/A 09/07/2012    Procedure: PERMANENT PACEMAKER INSERTION;  Surgeon:  Marinus Maw, MD;  Location: [facility]ATH LAB;  Service: Cardiovascular;  Laterality: N/A;  . Hernia repair    . Joint replacement    . Spine surgery    . Cardiac valve replacement    . Prostate surgery     Family History  Problem Relation Age of Onset  . Coronary artery disease Brother   . Healthy Mother   . Emphysema Father   . Healthy Sister   . Alzheimer's disease Brother    Social History  Substance Use Topics  . Smoking status: Former Smoker    Quit date: 10/29/1975  . Smokeless tobacco: Never Used  . Alcohol Use: No    Review of Systems  Constitutional: Positive for fever, appetite change and fatigue.  Respiratory: Negative for cough and shortness of breath.   Cardiovascular: Negative for chest pain, palpitations and leg swelling.  Gastrointestinal: Negative for nausea, vomiting, abdominal pain, diarrhea and constipation.  Genitourinary: Negative for dysuria, hematuria, flank pain, discharge, penile swelling, scrotal swelling, penile pain and testicular pain.  Musculoskeletal: Positive for back pain.  Neurological: Negative for dizziness, tremors, syncope, facial asymmetry, speech difficulty, weakness, light-headedness, numbness and headaches.  All other systems reviewed and are negative.     Allergies  Tape  Home Medications   Prior to Admission medications   Medication Sig Start Date End Date Taking? Authorizing Provider  docusate sodium (COLACE) 100 MG capsule Take 100 mg by mouth 2 (two) times daily.   Yes Historical Provider, MD  folic acid (FOLVITE) 1 MG tablet Take 1 mg by mouth daily.    Yes Historical Provider, MD  furosemide (LASIX) 20 MG tablet Take 20 mg by mouth daily as needed (swelling).   Yes Historical Provider, MD  gabapentin (NEURONTIN) 300 MG capsule Take 300 mg by mouth 2 (two) times daily.   Yes Historical Provider, MD  HYDROcodone-acetaminophen (NORCO) 10-325 MG tablet Take 1-2 tablets by mouth every 4 (four) hours as needed. (PAIN) 02/06/15   Yes Historical Provider, MD  hydrocortisone cream 1 % Apply 1 application topically 2 (two) times daily as needed (for leg itching).   Yes Historical Provider, MD  Multiple Vitamins-Minerals (PRESERVISION AREDS 2) CAPS Take 1 tablet by mouth 2 (two) times daily. Reported on 02/23/2015   Yes Historical Provider, MD  omeprazole (PRILOSEC) 20 MG capsule Take 20 mg by mouth daily.   Yes Historical Provider, MD  predniSONE (DELTASONE) 5 MG tablet Take 1 tablet (5 mg total) by mouth daily with breakfast. 09/13/12  Yes Wayne E Gold, PA-C  rOPINIRole (REQUIP) 1 MG tablet Take 1 mg by mouth daily. 01/09/15  Yes Historical Provider, MD  simvastatin (ZOCOR) 20 MG tablet Take 20 mg by mouth at bedtime.    Yes Historical Provider, MD  traMADol (ULTRAM) 50 MG tablet Take 25 mg  by mouth daily. 11/28/14  Yes Historical Provider, MD  warfarin (COUMADIN) 6 MG tablet Take 1 tablet (6 mg total) by mouth every evening. Patient taking differently: Take 6 mg by mouth every evening. Tuesday and Saturday take , and Monday, Wednesday, Thursday, Friday, and Sunday take  02/17/13  Yes Matthew Babish, PA-C   BP 101/50 mmHg  Pulse 85  Temp(Src) 99.6 F (37.6 C) (Oral)  Resp 16  SpO2 95% Physical Exam  Constitutional: He is oriented to person, place, and time. He appears well-developed and well-nourished. No distress.  HENT:  Head: Normocephalic and atraumatic.  Eyes: Conjunctivae and EOM are normal. Pupils are equal, round, and reactive to light.  Neck: Normal range of motion. Neck supple.  Cardiovascular: Normal rate, regular rhythm, normal heart sounds and intact distal pulses.   Pulmonary/Chest: Effort normal and breath sounds normal. No respiratory distress.  Abdominal: Soft. Bowel sounds are normal.  Musculoskeletal: He exhibits no edema or tenderness.  Full ROM in all extremities. Paraspinal tenderness to lumbar spine. Exam limited by intensity of patient's pain. Even slight movement of the back makes patient  cry out in pain and tear up.  Neurological: He is alert and oriented to person, place, and time. He has normal reflexes.  No sensory deficits. Strength 5/5 in all extremities. Cranial nerves III-XII grossly intact. No facial droop.  Skin: Skin is warm and dry. He is not diaphoretic.  Nursing note and vitals reviewed.   ED Course  Procedures (including critical care time) Labs Review Labs Reviewed  CBC WITH DIFFERENTIAL/PLATELET - Abnormal; Notable for the following:    RBC 3.19 (*)    Hemoglobin 9.3 (*)    HCT 29.3 (*)    Platelets 102 (*)    Lymphs Abs 0.6 (*)    All other components within normal limits  COMPREHENSIVE METABOLIC PANEL - Abnormal; Notable for the following:    Glucose, Bld 118 (*)    BUN 32 (*)    Creatinine, Ser 1.75 (*)    Calcium 8.1 (*)    Albumin 3.4 (*)    ALT 10 (*)    GFR calc non Af Amer 33 (*)    GFR calc Af Amer 38 (*)    All other components within normal limits  URINALYSIS, ROUTINE W REFLEX MICROSCOPIC (NOT AT [facility]) - Abnormal; Notable for the following:    Hgb urine dipstick SMALL (*)    Protein, ur 100 (*)    All other components within normal limits  SEDIMENTATION RATE - Abnormal; Notable for the following:    Sed Rate 58 (*)    All other components within normal limits  URINE MICROSCOPIC-ADD ON - Abnormal; Notable for the following:    Squamous Epithelial / LPF 0-5 (*)    Bacteria, UA FEW (*)    All other components within normal limits  CULTURE, BLOOD (ROUTINE X 2)  CULTURE, BLOOD (ROUTINE X 2)  URINE CULTURE  I-STAT CG4 LACTIC ACID, ED  I-STAT CG4 LACTIC ACID, ED    Imaging Review Dg Chest 1 View  02/25/2015  CLINICAL DATA:  81 year old male with back pain for 6 hours. EXAM: CHEST 1 VIEW COMPARISON:  Chest radiograph 01/20/2013. Included lung bases from CT abdomen/pelvis performed concurrently. FINDINGS: Single lead left-sided pacemaker remains in place. Patient is post median sternotomy with 2 prosthetic valves. Multi chamber  cardiomegaly, progressed from prior exam. There is tortuosity and atherosclerosis of the thoracic aorta. Vascular congestion with possible mild perihilar edema. Calcified granuloma in the  right upper lung. Small pleural effusions on CT are not seen radiographically. No confluent airspace disease. No pneumothorax. IMPRESSION: Cardiomegaly with vascular congestion and questionable perihilar edema. Recommend correlation for CHF. Electronically Signed   By: Rubye Oaks M.D.   On: 02/25/2015 22:30   Dg Thoracic Spine 2 View  02/25/2015  CLINICAL DATA:  Lower back pain since 4:30 this afternoon. History of chronic back pain. No injury noted. EXAM: THORACIC SPINE 2 VIEWS COMPARISON:  Chest x-ray dated 01/20/2013. FINDINGS: Portions of the thoracic spine are incompletely seen due to overlying osseous and soft tissue structures. Overall osseous alignment appears normal and grossly stable compared to a previous chest x-ray of 01/20/2013. No osseous fracture or dislocation seen. No acute - appearing cortical irregularity or osseous lesion. Paravertebral soft tissues are unremarkable for acute process. IMPRESSION: No acute findings. Electronically Signed   By: Bary Richard M.D.   On: 02/25/2015 22:29   Dg Lumbar Spine Complete  02/25/2015  CLINICAL DATA:  81 year old male with lumbosacral back pain for 6 hours. EXAM: LUMBAR SPINE - COMPLETE 4+ VIEW COMPARISON:  Reformats from CT abdomen/ pelvis 09/02/2011. FINDINGS: Kyphoplasty within L4 compression deformity. Remaining vertebral body heights are maintained. Mild levoscoliosis centered at L2-L3 with associated degenerative disc disease. Additional disc space narrowing at L1-L2. The bones are under mineralized. Aorto bi-iliac stent in place. IMPRESSION: 1. Kyphoplasty within L4 compression deformity. 2. Scoliosis and degenerative change.  No acute bony abnormality. Electronically Signed   By: Rubye Oaks M.D.   On: 02/25/2015 22:26   Ct Abdomen Pelvis W  Contrast  02/25/2015  CLINICAL DATA:  Severe low back pain with any movement. History of chronic back pain. Abdominal pain and tenderness. EXAM: CT ABDOMEN AND PELVIS WITH CONTRAST TECHNIQUE: Multidetector CT imaging of the abdomen and pelvis was performed using the standard protocol following bolus administration of intravenous contrast. CONTRAST:  70mL OMNIPAQUE IOHEXOL 300 MG/ML  SOLN COMPARISON:  09/02/2011 FINDINGS: Small bilateral pleural effusions. Multiple surgical clips at the EG junction. Postoperative changes in the mediastinum. Surgical absence of the gallbladder. Calcified granulomas scattered throughout the liver and spleen. No hepatosplenomegaly. The pancreas, adrenal glands, inferior vena cava, and retroperitoneal lymph nodes are unremarkable. Abdominal aortic aneurysm post aortoiliac stent grafts. Native sac diameter is 5.6 cm. This measurement is similar to the previous study. Kidneys are atrophic bilaterally. Renal nephrograms appear symmetrical. No hydronephrosis. Large cysts in both kidneys, largest is off of the lower pole of the left kidney and measures about 8.9 cm diameter. This cyst is increased in size since the previous study. The stomach, small bowel, and colon are not abnormally distended. The colon is diffusely stool-filled. No free air or free fluid in the abdomen. Surgical clips in the anterior abdominal wall. Pelvis: Prostate gland is not enlarged. Prostate defect may indicate previous TUR procedure. Bladder wall is not thickened. Appendix is surgically absent. No free or loculated pelvic fluid collections. No pelvic mass or lymphadenopathy. Vascular calcifications in the pelvis. Fat in the inguinal canals. Degenerative changes in the lumbar spine. L4 compression deformity post kyphoplasty. No destructive bone lesions. IMPRESSION: No significant acute process demonstrated in the abdomen or pelvis. There are small bilateral pleural effusions. Postoperative changes demonstrated  throughout the chest, abdomen, and pelvis. Abdominal aortic aneurysm post stent graft. Native sac diameter is 5.6 cm without change since prior study. Multiple renal cysts with enlarging large cyst on the left kidney. Electronically Signed   By: Burman Nieves M.D.   On: 02/25/2015 22:42  I have personally reviewed and evaluated these images and lab results as part of my medical decision-making.   EKG Interpretation None      MDM   Final diagnoses:  Lumbar back pain  Fever, unspecified fever cause    Ethan Lawrence presents with sudden onset of lower back pain accompanied by fever.  Findings and plan of care discussed with Mancel Bale, MD.  Sepsis protocol was initiated accompanied by a search for a source. Patient is febrile but not tachycardic and is normotensive. Patient had an echo performed 2 days ago with no abnormalities found. X-ray tech called stating that plain films are usually preferred in addition to MRI and that the radiologist typically want to see plain x-rays prior to calling in the MRI techs. 10:39 PM Upon reassessment, patient states that his pain is well-controlled at this time. Patient still has a fever of 103. A higher repeat dose of Tylenol was ordered. CT scan is free from acute abnormalities. Patient to be transferred to [facility] for thoracic and lumbar spine MRI. 11:21 PM spoke with Dr. Jeraldine Loots at [facility] ED, who agreed to accept the patient as a transfer. He was communicated to Dr. Jeraldine Loots that the patient is being sent for an MRI and further management. Dr. Jeraldine Loots stated that he may not be there by the time the patient arrived, but would pass the message to the other physicians in the department just in case. Dr. Jeraldine Loots asked that the need for an MRI be included on the transfer order.  12:05 AM repeat temperature check shows that patient is no longer febrile with a temperature of 98.9. 1:37 AM End of shift patient care report given to Elpidio Anis, PA-C to make her aware of the patient. Pt should not need any further care here at [facility] other than occasional pain management. Patient to receive MRI and depending on findings possibly admitted to [facility].  Filed Vitals:   02/25/15 2245 02/25/15 2305 02/25/15 2331 02/26/15 0020  BP: 164/91  153/124 105/58  Pulse:   104 90  Temp:  102 F (38.9 C)    TempSrc:  Oral    Resp:   16 22  SpO2:   97% 95%   Filed Vitals:   02/25/15 2305 02/25/15 2331 02/26/15 0020 02/26/15 0101  BP:  153/124 105/58 101/50  Pulse:  104 91 85  Temp: 102 F (38.9 C)  100.7 F (38.2 C) 99.6 F (37.6 C)  TempSrc: Oral  Oral Oral  Resp:  16 23 16   SpO2:  97% 95% 95%       Anselm Pancoast, PA-C 02/26/15 0139  Mancel Bale, MD 02/28/15 2111

## 2015-02-25 NOTE — ED Provider Notes (Signed)
  Face-to-face evaluation   History: He presents for evaluation of fever, chills, back pain and abdominal pain. He has been told that he has a abdominal hernia, and [facility]. [place] is visiting from [place]. He also has intermittent chronic low back pain and is status post back surgery. His back has been hurting for several days. He's been having fever and chills for several days. There's been no dysuria, urinary frequency, or hematuria. He is taking Tylenol for fever.  Physical exam: Elderly man who is alert and uncomfortable. Abdomen is soft, distended. The right lower quadrant. There is no palpable abdominal wall or inguinal hernia. He has severe back pain, which makes him unable to rollover on the stretcher.  Medical decision-making- male with nonspecific abdominal and back pain. Lactate is normal. Doubt severe sepsis. Patient requires imaging of back, to rule out spinal abscess, or discitis. If these are negative, he can be treated symptomatically. I doubt endocarditis. He did have a cardiac echo, during the time that he had a fever, which minimizes the risk for endocarditis. Blood cultures are pending. He was initially covered with Rocephin for possible urinary tract infection. However, he did not have clinical evidence for it, and the urinalysis is negative. There is no clinical evidence for inguinal hernia on my exam, and the CT scan did not indicate a abdominal or inguinal hernia.  Medical screening examination/treatment/procedure(s) were conducted as a shared visit with non-physician practitioner(s) and myself.  I personally evaluated the patient during the encounter  Mancel BaleElliott Tatjana Turcott, MD 02/28/15 2110

## 2015-02-26 ENCOUNTER — Encounter (HOSPITAL_COMMUNITY): Payer: Self-pay | Admitting: *Deleted

## 2015-02-26 DIAGNOSIS — Z952 Presence of prosthetic heart valve: Secondary | ICD-10-CM | POA: Diagnosis not present

## 2015-02-26 DIAGNOSIS — Z7901 Long term (current) use of anticoagulants: Secondary | ICD-10-CM

## 2015-02-26 DIAGNOSIS — I5032 Chronic diastolic (congestive) heart failure: Secondary | ICD-10-CM

## 2015-02-26 DIAGNOSIS — K4090 Unilateral inguinal hernia, without obstruction or gangrene, not specified as recurrent: Secondary | ICD-10-CM | POA: Diagnosis present

## 2015-02-26 DIAGNOSIS — H9190 Unspecified hearing loss, unspecified ear: Secondary | ICD-10-CM | POA: Diagnosis present

## 2015-02-26 DIAGNOSIS — M4620 Osteomyelitis of vertebra, site unspecified: Secondary | ICD-10-CM | POA: Diagnosis present

## 2015-02-26 DIAGNOSIS — I350 Nonrheumatic aortic (valve) stenosis: Secondary | ICD-10-CM | POA: Diagnosis not present

## 2015-02-26 DIAGNOSIS — R509 Fever, unspecified: Secondary | ICD-10-CM | POA: Diagnosis present

## 2015-02-26 DIAGNOSIS — I2510 Atherosclerotic heart disease of native coronary artery without angina pectoris: Secondary | ICD-10-CM

## 2015-02-26 DIAGNOSIS — H3530 Unspecified macular degeneration: Secondary | ICD-10-CM | POA: Diagnosis present

## 2015-02-26 DIAGNOSIS — T826XXA Infection and inflammatory reaction due to cardiac valve prosthesis, initial encounter: Secondary | ICD-10-CM | POA: Diagnosis present

## 2015-02-26 DIAGNOSIS — K0530 Chronic periodontitis, unspecified: Secondary | ICD-10-CM | POA: Diagnosis present

## 2015-02-26 DIAGNOSIS — M4640 Discitis, unspecified, site unspecified: Secondary | ICD-10-CM | POA: Diagnosis present

## 2015-02-26 DIAGNOSIS — I351 Nonrheumatic aortic (valve) insufficiency: Secondary | ICD-10-CM | POA: Diagnosis not present

## 2015-02-26 DIAGNOSIS — K036 Deposits [accretions] on teeth: Secondary | ICD-10-CM | POA: Diagnosis present

## 2015-02-26 DIAGNOSIS — M545 Low back pain, unspecified: Secondary | ICD-10-CM | POA: Insufficient documentation

## 2015-02-26 DIAGNOSIS — M549 Dorsalgia, unspecified: Secondary | ICD-10-CM | POA: Diagnosis not present

## 2015-02-26 DIAGNOSIS — M4646 Discitis, unspecified, lumbar region: Secondary | ICD-10-CM | POA: Diagnosis not present

## 2015-02-26 DIAGNOSIS — Z950 Presence of cardiac pacemaker: Secondary | ICD-10-CM | POA: Diagnosis not present

## 2015-02-26 DIAGNOSIS — I38 Endocarditis, valve unspecified: Secondary | ICD-10-CM | POA: Diagnosis not present

## 2015-02-26 DIAGNOSIS — I340 Nonrheumatic mitral (valve) insufficiency: Secondary | ICD-10-CM | POA: Diagnosis not present

## 2015-02-26 DIAGNOSIS — Z7952 Long term (current) use of systemic steroids: Secondary | ICD-10-CM | POA: Diagnosis not present

## 2015-02-26 DIAGNOSIS — A419 Sepsis, unspecified organism: Secondary | ICD-10-CM | POA: Diagnosis not present

## 2015-02-26 DIAGNOSIS — F0390 Unspecified dementia without behavioral disturbance: Secondary | ICD-10-CM | POA: Diagnosis present

## 2015-02-26 DIAGNOSIS — B954 Other streptococcus as the cause of diseases classified elsewhere: Secondary | ICD-10-CM | POA: Diagnosis not present

## 2015-02-26 DIAGNOSIS — Z95828 Presence of other vascular implants and grafts: Secondary | ICD-10-CM

## 2015-02-26 DIAGNOSIS — F419 Anxiety disorder, unspecified: Secondary | ICD-10-CM | POA: Diagnosis present

## 2015-02-26 DIAGNOSIS — A499 Bacterial infection, unspecified: Secondary | ICD-10-CM | POA: Diagnosis not present

## 2015-02-26 DIAGNOSIS — Z96652 Presence of left artificial knee joint: Secondary | ICD-10-CM | POA: Diagnosis present

## 2015-02-26 DIAGNOSIS — R197 Diarrhea, unspecified: Secondary | ICD-10-CM | POA: Diagnosis not present

## 2015-02-26 DIAGNOSIS — Z954 Presence of other heart-valve replacement: Secondary | ICD-10-CM | POA: Diagnosis not present

## 2015-02-26 DIAGNOSIS — E785 Hyperlipidemia, unspecified: Secondary | ICD-10-CM | POA: Diagnosis present

## 2015-02-26 DIAGNOSIS — I25111 Atherosclerotic heart disease of native coronary artery with angina pectoris with documented spasm: Secondary | ICD-10-CM | POA: Diagnosis not present

## 2015-02-26 DIAGNOSIS — Z79899 Other long term (current) drug therapy: Secondary | ICD-10-CM | POA: Diagnosis not present

## 2015-02-26 DIAGNOSIS — T368X5A Adverse effect of other systemic antibiotics, initial encounter: Secondary | ICD-10-CM | POA: Diagnosis not present

## 2015-02-26 DIAGNOSIS — D696 Thrombocytopenia, unspecified: Secondary | ICD-10-CM | POA: Diagnosis present

## 2015-02-26 DIAGNOSIS — Z951 Presence of aortocoronary bypass graft: Secondary | ICD-10-CM | POA: Diagnosis not present

## 2015-02-26 DIAGNOSIS — I330 Acute and subacute infective endocarditis: Secondary | ICD-10-CM | POA: Diagnosis present

## 2015-02-26 DIAGNOSIS — I130 Hypertensive heart and chronic kidney disease with heart failure and stage 1 through stage 4 chronic kidney disease, or unspecified chronic kidney disease: Secondary | ICD-10-CM | POA: Diagnosis present

## 2015-02-26 DIAGNOSIS — A408 Other streptococcal sepsis: Secondary | ICD-10-CM | POA: Diagnosis present

## 2015-02-26 DIAGNOSIS — R6510 Systemic inflammatory response syndrome (SIRS) of non-infectious origin without acute organ dysfunction: Secondary | ICD-10-CM | POA: Diagnosis not present

## 2015-02-26 DIAGNOSIS — K060 Gingival recession: Secondary | ICD-10-CM | POA: Diagnosis present

## 2015-02-26 DIAGNOSIS — Y831 Surgical operation with implant of artificial internal device as the cause of abnormal reaction of the patient, or of later complication, without mention of misadventure at the time of the procedure: Secondary | ICD-10-CM | POA: Diagnosis present

## 2015-02-26 DIAGNOSIS — I339 Acute and subacute endocarditis, unspecified: Secondary | ICD-10-CM | POA: Diagnosis not present

## 2015-02-26 DIAGNOSIS — I482 Chronic atrial fibrillation: Secondary | ICD-10-CM | POA: Diagnosis present

## 2015-02-26 DIAGNOSIS — N183 Chronic kidney disease, stage 3 (moderate): Secondary | ICD-10-CM | POA: Diagnosis present

## 2015-02-26 DIAGNOSIS — M4647 Discitis, unspecified, lumbosacral region: Secondary | ICD-10-CM | POA: Diagnosis not present

## 2015-02-26 DIAGNOSIS — Z87891 Personal history of nicotine dependence: Secondary | ICD-10-CM | POA: Diagnosis not present

## 2015-02-26 DIAGNOSIS — D649 Anemia, unspecified: Secondary | ICD-10-CM | POA: Diagnosis present

## 2015-02-26 DIAGNOSIS — I5033 Acute on chronic diastolic (congestive) heart failure: Secondary | ICD-10-CM | POA: Diagnosis present

## 2015-02-26 DIAGNOSIS — I10 Essential (primary) hypertension: Secondary | ICD-10-CM

## 2015-02-26 LAB — LACTIC ACID, PLASMA
LACTIC ACID, VENOUS: 1 mmol/L (ref 0.5–2.0)
LACTIC ACID, VENOUS: 1.6 mmol/L (ref 0.5–2.0)

## 2015-02-26 LAB — APTT: aPTT: 60 seconds — ABNORMAL HIGH (ref 24–37)

## 2015-02-26 LAB — PROCALCITONIN: PROCALCITONIN: 0.18 ng/mL

## 2015-02-26 LAB — PROTIME-INR
INR: 2.4 — ABNORMAL HIGH (ref 0.00–1.49)
PROTHROMBIN TIME: 25.9 s — AB (ref 11.6–15.2)

## 2015-02-26 MED ORDER — PANTOPRAZOLE SODIUM 40 MG PO TBEC
40.0000 mg | DELAYED_RELEASE_TABLET | Freq: Every day | ORAL | Status: DC
  Administered 2015-02-26 – 2015-03-08 (×10): 40 mg via ORAL
  Filled 2015-02-26 (×10): qty 1

## 2015-02-26 MED ORDER — DEXAMETHASONE SODIUM PHOSPHATE 10 MG/ML IJ SOLN
10.0000 mg | Freq: Once | INTRAMUSCULAR | Status: AC
  Administered 2015-02-26: 10 mg via INTRAVENOUS
  Filled 2015-02-26: qty 1

## 2015-02-26 MED ORDER — ALUM & MAG HYDROXIDE-SIMETH 200-200-20 MG/5ML PO SUSP: 30.0000 mL | Freq: Four times a day (QID) | ORAL | Status: DC | PRN

## 2015-02-26 MED ORDER — ONDANSETRON HCL 4 MG/2ML IJ SOLN: 4.0000 mg | Freq: Four times a day (QID) | INTRAMUSCULAR | Status: DC | PRN

## 2015-02-26 MED ORDER — ACETAMINOPHEN 650 MG RE SUPP: 650.0000 mg | Freq: Four times a day (QID) | RECTAL | Status: DC | PRN

## 2015-02-26 MED ORDER — SODIUM CHLORIDE 0.9 % IJ SOLN: 3.0000 mL | INTRAMUSCULAR | Status: DC | PRN

## 2015-02-26 MED ORDER — ACETAMINOPHEN 325 MG PO TABS
650.0000 mg | ORAL_TABLET | Freq: Four times a day (QID) | ORAL | Status: DC | PRN
  Administered 2015-03-03 – 2015-03-06 (×3): 650 mg via ORAL
  Filled 2015-02-26 (×3): qty 2

## 2015-02-26 MED ORDER — CLOTRIMAZOLE 1 % EX CREA
TOPICAL_CREAM | Freq: Two times a day (BID) | CUTANEOUS | Status: DC
  Administered 2015-02-26 – 2015-03-02 (×9): via TOPICAL
  Administered 2015-03-03: 1 via TOPICAL
  Administered 2015-03-04 – 2015-03-08 (×9): via TOPICAL
  Filled 2015-02-26: qty 15

## 2015-02-26 MED ORDER — METHOCARBAMOL 500 MG PO TABS
500.0000 mg | ORAL_TABLET | Freq: Once | ORAL | Status: AC
  Administered 2015-02-26: 500 mg via ORAL
  Filled 2015-02-26: qty 1

## 2015-02-26 MED ORDER — FOLIC ACID 1 MG PO TABS
1.0000 mg | ORAL_TABLET | Freq: Every day | ORAL | Status: DC
  Administered 2015-02-26 – 2015-03-08 (×10): 1 mg via ORAL
  Filled 2015-02-26 (×10): qty 1

## 2015-02-26 MED ORDER — SIMVASTATIN 20 MG PO TABS
20.0000 mg | ORAL_TABLET | Freq: Every day | ORAL | Status: DC
  Administered 2015-02-26 – 2015-03-07 (×10): 20 mg via ORAL
  Filled 2015-02-26 (×10): qty 1

## 2015-02-26 MED ORDER — METHOCARBAMOL 500 MG PO TABS
500.0000 mg | ORAL_TABLET | Freq: Four times a day (QID) | ORAL | Status: DC | PRN
  Administered 2015-02-26 – 2015-03-08 (×19): 500 mg via ORAL
  Filled 2015-02-26 (×19): qty 1

## 2015-02-26 MED ORDER — VANCOMYCIN HCL IN DEXTROSE 1-5 GM/200ML-% IV SOLN
1000.0000 mg | INTRAVENOUS | Status: DC
  Administered 2015-02-27 – 2015-02-28 (×2): 1000 mg via INTRAVENOUS
  Filled 2015-02-26 (×2): qty 200

## 2015-02-26 MED ORDER — OCUVITE-LUTEIN PO CAPS
1.0000 | ORAL_CAPSULE | Freq: Two times a day (BID) | ORAL | Status: DC
  Administered 2015-02-26 – 2015-03-08 (×18): 1 via ORAL
  Filled 2015-02-26 (×24): qty 1

## 2015-02-26 MED ORDER — OXYCODONE HCL 5 MG PO TABS
5.0000 mg | ORAL_TABLET | ORAL | Status: DC | PRN
  Administered 2015-02-26 – 2015-03-08 (×27): 5 mg via ORAL
  Filled 2015-02-26 (×28): qty 1

## 2015-02-26 MED ORDER — SODIUM CHLORIDE 0.9 % IJ SOLN
3.0000 mL | Freq: Two times a day (BID) | INTRAMUSCULAR | Status: DC
  Administered 2015-03-01 – 2015-03-08 (×5): 3 mL via INTRAVENOUS

## 2015-02-26 MED ORDER — FUROSEMIDE 20 MG PO TABS: 20.0000 mg | ORAL_TABLET | Freq: Every day | ORAL | Status: DC | PRN

## 2015-02-26 MED ORDER — ONDANSETRON HCL 4 MG PO TABS: 4.0000 mg | ORAL_TABLET | Freq: Four times a day (QID) | ORAL | Status: DC | PRN

## 2015-02-26 MED ORDER — WARFARIN SODIUM 6 MG PO TABS
6.0000 mg | ORAL_TABLET | Freq: Once | ORAL | Status: AC
  Administered 2015-02-26: 6 mg via ORAL
  Filled 2015-02-26: qty 1

## 2015-02-26 MED ORDER — WARFARIN SODIUM 6 MG PO TABS: 6.0000 mg | ORAL_TABLET | Freq: Every evening | ORAL | Status: DC

## 2015-02-26 MED ORDER — OXYCODONE-ACETAMINOPHEN 7.5-325 MG PO TABS
1.0000 | ORAL_TABLET | ORAL | Status: DC | PRN
  Administered 2015-02-26 – 2015-03-02 (×7): 2 via ORAL
  Filled 2015-02-26 (×8): qty 2

## 2015-02-26 MED ORDER — VANCOMYCIN HCL 10 G IV SOLR
1250.0000 mg | Freq: Once | INTRAVENOUS | Status: AC
  Administered 2015-02-26: 1250 mg via INTRAVENOUS
  Filled 2015-02-26: qty 1250

## 2015-02-26 MED ORDER — ROPINIROLE HCL 1 MG PO TABS
1.0000 mg | ORAL_TABLET | Freq: Every day | ORAL | Status: DC
  Administered 2015-02-26 – 2015-03-08 (×10): 1 mg via ORAL
  Filled 2015-02-26 (×10): qty 1

## 2015-02-26 MED ORDER — PIPERACILLIN-TAZOBACTAM 3.375 G IVPB
3.3750 g | Freq: Three times a day (TID) | INTRAVENOUS | Status: DC
  Administered 2015-02-26 – 2015-02-27 (×3): 3.375 g via INTRAVENOUS
  Filled 2015-02-26 (×6): qty 50

## 2015-02-26 MED ORDER — WARFARIN - PHARMACIST DOSING INPATIENT
Freq: Every day | Status: DC
Start: 2015-02-26 — End: 2015-03-08
  Administered 2015-03-03 – 2015-03-06 (×3)

## 2015-02-26 MED ORDER — SODIUM CHLORIDE 0.9 % IV SOLN
250.0000 mL | INTRAVENOUS | Status: DC | PRN
  Administered 2015-03-03: 250 mL via INTRAVENOUS

## 2015-02-26 MED ORDER — PREDNISONE 5 MG PO TABS
5.0000 mg | ORAL_TABLET | Freq: Every day | ORAL | Status: DC
  Administered 2015-02-26 – 2015-03-08 (×10): 5 mg via ORAL
  Filled 2015-02-26 (×10): qty 1

## 2015-02-26 MED ORDER — PIPERACILLIN-TAZOBACTAM 3.375 G IVPB 30 MIN
3.3750 g | INTRAVENOUS | Status: AC
  Administered 2015-02-26: 3.375 g via INTRAVENOUS
  Filled 2015-02-26 (×2): qty 50

## 2015-02-26 MED ORDER — GABAPENTIN 300 MG PO CAPS
300.0000 mg | ORAL_CAPSULE | Freq: Two times a day (BID) | ORAL | Status: DC
  Administered 2015-02-26 – 2015-03-08 (×20): 300 mg via ORAL
  Filled 2015-02-26 (×20): qty 1

## 2015-02-26 MED ORDER — HYDROMORPHONE HCL 1 MG/ML IJ SOLN
0.5000 mg | INTRAMUSCULAR | Status: DC | PRN
  Administered 2015-02-26 – 2015-03-06 (×18): 1 mg via INTRAVENOUS
  Administered 2015-03-06: 0.5 mg via INTRAVENOUS
  Administered 2015-03-07 – 2015-03-08 (×4): 1 mg via INTRAVENOUS
  Filled 2015-02-26 (×23): qty 1

## 2015-02-26 MED ORDER — METHOCARBAMOL 1000 MG/10ML IJ SOLN: 250.0000 mg | Freq: Once | INTRAMUSCULAR | Status: DC

## 2015-02-26 MED ORDER — METHOCARBAMOL 1000 MG/10ML IJ SOLN
250.0000 mg | Freq: Once | INTRAMUSCULAR | Status: DC
  Filled 2015-02-26: qty 2.5

## 2015-02-26 MED ORDER — DOCUSATE SODIUM 100 MG PO CAPS
100.0000 mg | ORAL_CAPSULE | Freq: Two times a day (BID) | ORAL | Status: DC
  Administered 2015-02-26 – 2015-03-04 (×12): 100 mg via ORAL
  Filled 2015-02-26 (×15): qty 1

## 2015-02-26 NOTE — ED Notes (Signed)
Pt moaning and screaming in pain. Pt noted to have blood in urine. Dr.Pollina aware and at bedside

## 2015-02-26 NOTE — ED Notes (Signed)
Dr. Pollina at bedside   

## 2015-02-26 NOTE — Consult Note (Signed)
[facility]  Total days of antibiotics 2         Day 2 vanco        Day 2 piptazo               Reason for Consult: gram positive bacteremia    Referring Physician: gherghe  Principal Problem:   Intractable back pain Active Problems:   Anemia   Chronic a-fib on Coumadin   CAD (coronary artery disease) s/p CABG   HTN (hypertension)   Chronic diastolic heart failure (HCC)   Febrile illness   Lumbar pain   SIRS (systemic inflammatory response syndrome) (HCC)   Pyrexia    HPI: Ethan Lawrence is a 81 y.o. male younger than stated age, though has many medical problems including hx of htn, CAD s/p 4V-CABG, bioprosthetic AVR, and TV repair, pacemaker, chronic afib on anticoagulation, hx of pmr on low dose steroids, mild dementia. Also has endovascular repair of AAA in 2012. He is admitted for acute back pain x 1 day but also subscribes to having fevers of 101-102 intermittently for the past week. On admit, found to have fever of 103.96F, infectious work up of chest xray, C/A/P CT, UA was unrevealing, though blood cx + < 24hr. His WBC was not elevated nor significant left shift, WBC of 5.4, 80%N. Sed rate of 58. Gram stain shows GPC in pairs. He was started on vancomycin and piptazo. Denies any GI illness, no dysuria, no flu like illness, though is having new onset back pain which still persists.  He was seen by cardiology days prior to admit for evaluation of shortness of breath and had TTE  Soc hx: retired last year. 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 Daughter in law is a family practice physician  Past Medical History  Diagnosis Date  . Hypertension   . Hyperlipidemia   . CAD (coronary artery disease)     a. 05/2004 CABGx4 (LIMA->LAD, VG->RI, VG->OM, VG->RCA);  b. 08/2012 Cath: Native 3VD with 3/4 patent grafts (VG->RI occluded).  . Atrial fibrillation ([place])     . Macular degeneration   . Aortic stenosis     a. 08/2012 TEE: EF 45-55%, critical AS, Triv AI, mild to mod MR, Sev TR;  b. 08/2012 AVR (92m Magna Ease pericardial tissue valve) & TV Repair (361mMC3 annuloplasty ring).  . [place] Kitchen[place]    a. 06/2010 s/p Endovascular AAA Repair  . Complete heart block, [place]    s/p MDT pacemaker implant by Dr TaLovena Le. Pain     PT HAS A LOT OF LOWER BACK PAIN- PREVIOUS BACK SURGERY AND DJD  . Pacemaker   . Shortness of breath     MOSTLY WITH EXERTION - IMPROVED SINCE HEART VALVE REPLACEMENT  . Arthritis     LEFT KNEE OA AND PAIN;  DJD LUMBAR  . Anemia requiring transfusions     MOST RECENT WAS JUNE 2014 WITH HEART SURGERY  . Complication of anesthesia     STATES HE DOES WELL WITH DIPROVAN  . Blood transfusion without reported diagnosis   . Cataract   . CHF (congestive heart failure) (HCC)     Allergies:  Allergies  Allergen Reactions  . Tape Other (See Comments)    Pulls skin off    MEDICATIONS: . clotrimazole   Topical BID  . docusate  sodium  100 mg Oral BID  . folic acid  1 mg Oral Daily  . gabapentin  300 mg Oral BID  . multivitamin-lutein  1 capsule Oral BID  . pantoprazole  40 mg Oral Daily  . piperacillin-tazobactam (ZOSYN)  IV  3.375 g Intravenous 3 times per day  . predniSONE  5 mg Oral Q breakfast  . rOPINIRole  1 mg Oral Daily  . simvastatin  20 mg Oral QHS  . sodium chloride  3 mL Intravenous Q12H  . [START ON 02/27/2015] vancomycin  1,000 mg Intravenous Q24H  . warfarin  6 mg Oral ONCE-1800  . Warfarin - Pharmacist Dosing Inpatient   Does not apply q1800    Social History  Substance Use Topics  . Smoking status: Former Smoker    Quit date: 10/29/1975  . Smokeless tobacco: Never Used  . Alcohol Use: No    Family History  Problem Relation Age of Onset  . Coronary artery disease Brother   . Healthy Mother   . Emphysema Father   . Healthy Sister   . Alzheimer's disease  Brother     Review of Systems -  Constitutional: positive for fever, and fatigue but denies chills, diaphoresis, activity change, appetite change, fatigue and unexpected weight change.  HENT: Negative for congestion, sore throat, rhinorrhea, sneezing, trouble swallowing and sinus pressure.  Eyes: Negative for photophobia and visual disturbance.  Respiratory: Negative for cough, chest tightness, shortness of breath, wheezing and stridor.  Cardiovascular: Negative for chest pain, palpitations and leg swelling.  Gastrointestinal: right sided inguinal hernia discomfort. Negative for nausea, vomiting, abdominal pain, diarrhea, constipation, blood in stool, abdominal distention and anal bleeding.  Genitourinary: Negative for dysuria, hematuria, flank pain and difficulty urinating.  Musculoskeletal: acute low back pain. Negative for myalgias, , joint swelling, arthralgias and gait problem.  Skin: Negative for color change, pallor, rash and wound.  Neurological: Negative for dizziness, tremors, weakness and light-headedness.  Hematological: Negative for adenopathy. Does not bruise/bleed easily.  Psychiatric/Behavioral: Negative for behavioral problems, confusion, sleep disturbance, dysphoric mood, decreased concentration and agitation.    OBJECTIVE: Temp:  [98.3 F (36.8 C)-103.2 F (39.6 C)] 98.4 F (36.9 C) (12/18 1019) Pulse Rate:  [77-104] 94 (12/18 1019) Resp:  [16-23] 16 (12/18 1019) BP: (98-165)/(50-124) 132/77 mmHg (12/18 1019) SpO2:  [91 %-100 %] 100 % (12/18 1019) Weight:  [179 lb (81.194 kg)-180 lb 6.4 oz (81.829 kg)] 180 lb 6.4 oz (81.829 kg) (12/18 0640) Physical Exam  Constitutional: He is oriented to person, place, and time. He appears well-developed and well-nourished. No distress, but laying down in bed.  HENT:  Mouth/Throat: Oropharynx is clear and moist. No oropharyngeal exudate.  Cardiovascular: Normal rate, regular rhythm and normal heart sounds. Exam reveals no gallop  and no friction rub.  No murmur heard.  Pulmonary/Chest: Effort normal and breath sounds normal. No respiratory distress. He has no wheezes.  Abdominal: Soft. Bowel sounds are normal. He exhibits no distension. There is no tenderness.  Lymphadenopathy:  He has no cervical adenopathy.  Neurological: He is alert and oriented to person, place, and time.  Skin: Skin is warm and dry. No rash noted. No erythema. 2 circular 2cm lesions on ankles that apears c/w tinea. onycomychosis to great toe and 4th toe of left foot. No other lesions.  Psychiatric: He has a normal mood and affect. His behavior is normal.    LABS: Results for orders placed or performed during the hospital encounter of 02/25/15 (from the past 48 hour(s))  CBC with Differential     Status: Abnormal   Collection Time: 02/25/15  8:45 PM  Result Value Ref Range   WBC 5.4 4.0 - 10.5 K/uL   RBC 3.19 (L) 4.22 - 5.81 MIL/uL   Hemoglobin 9.3 (L) 13.0 - 17.0 g/dL   HCT 29.3 (L) 39.0 - 52.0 %   MCV 91.8 78.0 - 100.0 fL   MCH 29.2 26.0 - 34.0 pg   MCHC 31.7 30.0 - 36.0 g/dL   RDW 13.5 11.5 - 15.5 %   Platelets 102 (L) 150 - 400 K/uL    Comment: REPEATED TO VERIFY SPECIMEN CHECKED FOR CLOTS PLATELET COUNT CONFIRMED BY SMEAR    Neutrophils Relative % 80 %   Neutro Abs 4.3 1.7 - 7.7 K/uL   Lymphocytes Relative 12 %   Lymphs Abs 0.6 (L) 0.7 - 4.0 K/uL   Monocytes Relative 8 %   Monocytes Absolute 0.4 0.1 - 1.0 K/uL   Eosinophils Relative 0 %   Eosinophils Absolute 0.0 0.0 - 0.7 K/uL   Basophils Relative 0 %   Basophils Absolute 0.0 0.0 - 0.1 K/uL  Comprehensive metabolic panel     Status: Abnormal   Collection Time: 02/25/15  8:45 PM  Result Value Ref Range   Sodium 139 135 - 145 mmol/L   Potassium 4.3 3.5 - 5.1 mmol/L   Chloride 108 101 - 111 mmol/L   CO2 23 22 - 32 mmol/L   Glucose, Bld 118 (H) 65 - 99 mg/dL   BUN 32 (H) 6 - 20 mg/dL   Creatinine, Ser 1.75 (H) 0.61 - 1.24 mg/dL   Calcium 8.1 (L) 8.9 - 10.3 mg/dL   Total  Protein 6.6 6.5 - 8.1 g/dL   Albumin 3.4 (L) 3.5 - 5.0 g/dL   AST 17 15 - 41 U/L   ALT 10 (L) 17 - 63 U/L   Alkaline Phosphatase 86 38 - 126 U/L   Total Bilirubin 0.8 0.3 - 1.2 mg/dL   GFR calc non Af Amer 33 (L) >60 mL/min   GFR calc Af Amer 38 (L) >60 mL/min    Comment: (NOTE) The eGFR has been calculated using the CKD EPI equation. This calculation has not been validated in all clinical situations. eGFR's persistently <60 mL/min signify possible Chronic Kidney Disease.    Anion gap 8 5 - 15  Blood Culture (routine x 2)     Status: None (Preliminary result)   Collection Time: 02/25/15  8:45 PM  Result Value Ref Range   Specimen Description BLOOD RIGHT FOREARM    Special Requests BOTTLES DRAWN AEROBIC AND ANAEROBIC 5C    Culture  Setup Time      GRAM POSITIVE COCCI IN PAIRS ANAEROBIC BOTTLE ONLY CRITICAL RESULT CALLED TO, READ BACK BY AND VERIFIED WITH: K La Rue 02/26/15 @ 1209 M VESTAL    Culture      GRAM POSITIVE COCCI Performed at [facility]    Report Status PENDING   Sedimentation rate     Status: Abnormal   Collection Time: 02/25/15  8:45 PM  Result Value Ref Range   Sed Rate 58 (H) 0 - 16 mm/hr  I-Stat CG4 Lactic Acid, ED  (not at  [facility])     Status: None   Collection Time: 02/25/15  9:01 PM  Result Value Ref Range   Lactic Acid, Venous 1.15 0.5 - 2.0 mmol/L  Blood Culture (routine x 2)     Status: None (Preliminary result)   Collection Time: 02/25/15  9:15 PM  Result Value Ref Range   Specimen Description BLOOD LEFT FOREARM    Special Requests BOTTLES DRAWN AEROBIC AND ANAEROBIC 5CC    Culture  Setup Time      GRAM POSITIVE COCCI IN PAIRS ANAEROBIC BOTTLE ONLY CRITICAL RESULT CALLED TO, READ BACK BY AND VERIFIED WITH: K [place] 02/26/15 @ 1209 M VESTAL    Culture      GRAM POSITIVE COCCI Performed at [facility]    Report Status PENDING   Urinalysis, Routine w reflex microscopic (not at [facility])     Status: Abnormal   Collection Time:  02/25/15 10:59 PM  Result Value Ref Range   Color, Urine YELLOW YELLOW   APPearance CLEAR CLEAR   Specific Gravity, Urine 1.027 1.005 - 1.030   pH 6.0 5.0 - 8.0   Glucose, UA NEGATIVE NEGATIVE mg/dL   Hgb urine dipstick SMALL (A) NEGATIVE   Bilirubin Urine NEGATIVE NEGATIVE   Ketones, ur NEGATIVE NEGATIVE mg/dL   Protein, ur 100 (A) NEGATIVE mg/dL   Nitrite NEGATIVE NEGATIVE   Leukocytes, UA NEGATIVE NEGATIVE  Urine microscopic-add on     Status: Abnormal   Collection Time: 02/25/15 10:59 PM  Result Value Ref Range   Squamous Epithelial / LPF 0-5 (A) NONE SEEN   WBC, UA 6-30 0 - 5 WBC/hpf   RBC / HPF 6-30 0 - 5 RBC/hpf   Bacteria, UA FEW (A) NONE SEEN  I-Stat CG4 Lactic Acid, ED  (not at  [facility])     Status: None   Collection Time: 02/25/15 11:36 PM  Result Value Ref Range   Lactic Acid, Venous 0.80 0.5 - 2.0 mmol/L  Lactic acid, plasma     Status: None   Collection Time: 02/26/15  7:13 AM  Result Value Ref Range   Lactic Acid, Venous 1.0 0.5 - 2.0 mmol/L  Procalcitonin     Status: None   Collection Time: 02/26/15  7:13 AM  Result Value Ref Range   Procalcitonin 0.18 ng/mL    Comment:        Interpretation: PCT (Procalcitonin) <= 0.5 ng/mL: Systemic infection (sepsis) is not likely. Local bacterial infection is possible. (NOTE)         ICU PCT Algorithm               Non ICU PCT Algorithm    ----------------------------     ------------------------------         PCT < 0.25 ng/mL                 PCT < 0.1 ng/mL     Stopping of antibiotics            Stopping of antibiotics       strongly encouraged.               strongly encouraged.    ----------------------------     ------------------------------       PCT level decrease by               PCT < 0.25 ng/mL       >= 80% from peak PCT       OR PCT 0.25 - 0.5 ng/mL          Stopping of antibiotics  encouraged.     Stopping of antibiotics           encouraged.     ----------------------------     ------------------------------       PCT level decrease by              PCT >= 0.25 ng/mL       < 80% from peak PCT        AND PCT >= 0.5 ng/mL            Continuin g antibiotics                                              encouraged.       Continuing antibiotics            encouraged.    ----------------------------     ------------------------------     PCT level increase compared          PCT > 0.5 ng/mL         with peak PCT AND          PCT >= 0.5 ng/mL             Escalation of antibiotics                                          strongly encouraged.      Escalation of antibiotics        strongly encouraged.   Protime-INR     Status: Abnormal   Collection Time: 02/26/15  7:13 AM  Result Value Ref Range   Prothrombin Time 25.9 (H) 11.6 - 15.2 seconds   INR 2.40 (H) 0.00 - 1.49  APTT     Status: Abnormal   Collection Time: 02/26/15  7:13 AM  Result Value Ref Range   aPTT 60 (H) 24 - 37 seconds    Comment:        IF BASELINE aPTT IS ELEVATED, SUGGEST PATIENT RISK ASSESSMENT BE USED TO DETERMINE APPROPRIATE ANTICOAGULANT THERAPY.   Lactic acid, plasma     Status: None   Collection Time: 02/26/15 10:10 AM  Result Value Ref Range   Lactic Acid, Venous 1.6 0.5 - 2.0 mmol/L    MICRO: 12/17 blood cx GPC 2/2 sets 12/17 urine cx NGTD IMAGING: Dg Chest 1 View  02/25/2015  CLINICAL DATA:  81 year old male with back pain for 6 hours. EXAM: CHEST 1 VIEW COMPARISON:  Chest radiograph 01/20/2013. Included lung bases from CT abdomen/pelvis performed concurrently. FINDINGS: Single lead left-sided pacemaker remains in place. Patient is post median sternotomy with 2 prosthetic valves. Multi chamber cardiomegaly, progressed from prior exam. There is tortuosity and atherosclerosis of the thoracic aorta. Vascular congestion with possible mild perihilar edema. Calcified granuloma in the right upper lung. Small pleural effusions on CT are not seen  radiographically. No confluent airspace disease. No pneumothorax. IMPRESSION: Cardiomegaly with vascular congestion and questionable perihilar edema. Recommend correlation for CHF. Electronically Signed   By: Jeb Levering M.D.   On: 02/25/2015 22:30   Dg Thoracic Spine 2 View  02/25/2015  CLINICAL DATA:  Lower back pain since 4:30 this afternoon. History of chronic back pain. No injury noted. EXAM: THORACIC SPINE 2 VIEWS COMPARISON:  Chest x-ray dated 01/20/2013. FINDINGS: Portions  of the thoracic spine are incompletely seen due to overlying osseous and soft tissue structures. Overall osseous alignment appears normal and grossly stable compared to a previous chest x-ray of 01/20/2013. No osseous fracture or dislocation seen. No acute - appearing cortical irregularity or osseous lesion. Paravertebral soft tissues are unremarkable for acute process. IMPRESSION: No acute findings. Electronically Signed   By: Franki Cabot M.D.   On: 02/25/2015 22:29   Dg Lumbar Spine Complete  02/25/2015  CLINICAL DATA:  81 year old male with lumbosacral back pain for 6 hours. EXAM: LUMBAR SPINE - COMPLETE 4+ VIEW COMPARISON:  Reformats from CT abdomen/ pelvis 09/02/2011. FINDINGS: Kyphoplasty within L4 compression deformity. Remaining vertebral body heights are maintained. Mild levoscoliosis centered at L2-L3 with associated degenerative disc disease. Additional disc space narrowing at L1-L2. The bones are under mineralized. Aorto bi-iliac stent in place. IMPRESSION: 1. Kyphoplasty within L4 compression deformity. 2. Scoliosis and degenerative change.  No acute bony abnormality. Electronically Signed   By: Jeb Levering M.D.   On: 02/25/2015 22:26   Ct Abdomen Pelvis W Contrast  02/25/2015  CLINICAL DATA:  Severe low back pain with any movement. History of chronic back pain. Abdominal pain and tenderness. EXAM: CT ABDOMEN AND PELVIS WITH CONTRAST TECHNIQUE: Multidetector CT imaging of the abdomen and pelvis was  performed using the standard protocol following bolus administration of intravenous contrast. CONTRAST:  80m OMNIPAQUE IOHEXOL 300 MG/ML  SOLN COMPARISON:  09/02/2011 FINDINGS: Small bilateral pleural effusions. Multiple surgical clips at the EG junction. Postoperative changes in the mediastinum. Surgical absence of the gallbladder. Calcified granulomas scattered throughout the liver and spleen. No hepatosplenomegaly. The pancreas, adrenal glands, inferior vena cava, and retroperitoneal lymph nodes are unremarkable. Abdominal aortic aneurysm post aortoiliac stent grafts. Native sac diameter is 5.6 cm. This measurement is similar to the previous study. Kidneys are atrophic bilaterally. Renal nephrograms appear symmetrical. No hydronephrosis. Large cysts in both kidneys, largest is off of the lower pole of the left kidney and measures about 8.9 cm diameter. This cyst is increased in size since the previous study. The stomach, small bowel, and colon are not abnormally distended. The colon is diffusely stool-filled. No free air or free fluid in the abdomen. Surgical clips in the anterior abdominal wall. Pelvis: Prostate gland is not enlarged. Prostate defect may indicate previous TUR procedure. Bladder wall is not thickened. Appendix is surgically absent. No free or loculated pelvic fluid collections. No pelvic mass or lymphadenopathy. Vascular calcifications in the pelvis. Fat in the inguinal canals. Degenerative changes in the lumbar spine. L4 compression deformity post kyphoplasty. No destructive bone lesions. IMPRESSION: No significant acute process demonstrated in the abdomen or pelvis. There are small bilateral pleural effusions. Postoperative changes demonstrated throughout the chest, abdomen, and pelvis. Abdominal aortic aneurysm post stent graft. Native sac diameter is 5.6 cm without change since prior study. Multiple renal cysts with enlarging large cyst on the left kidney. Electronically Signed   By: WLucienne CapersM.D.   On: 02/25/2015 22:42     Assessment/Plan:  89yoM with bioprosthetic valves, pacemaker and endovascular graft admitted for fevers x 1 wk plus back pain found to have gram positive bacteremia. Interestingly only growing in anaerobic sets.  - continue with vancomycin and piptazo for now until identification comes likely tomorrow. Will narrow spectrum at that time - recommend to get TEE to evaluate if  AV or TV or pacemaker may be involved  - concern about back pain that bacteremia is causing secondary discitis, though  Abd/Pelvis CT did not comment. Consider getting CT of lumbar spine vs. Checking with dr. Tamala Julian from cardiology to see if pacemaker maybe mri compatible.  - further work up will determine the length of treatment needed.

## 2015-02-26 NOTE — Progress Notes (Signed)
Patient seen and examined this morning, H&P reviewed. 81 yo very functional male history of CAD, CHF,Chronic Atrial Fib on Coumadin Rx, AV Block S/P Pacemaker, AS, AAA S/P Repair, HTN, admitted with back pain progressive over the past 2-3 days and febrile illness for a week. He has been running a fever nightly for the past week, found to have fever of 103 on admission. [facility] was realized that he has a pacemaker.    Febrile illness / SIRS - without clear etiology, urinalysis unremarkable, CXR without infiltrates, CT abdomen pelvis without acute processes. He does have AAA post stent graft  - pro calcitonin low at 0.18, lactic acid normal  - empiric Vancomycin and Zosyn, continue, cultures obtained and pending - addendum: both sets speciated GPC in pairs. Patient with AAA graft, pacemaker, artificial valves. Consulted ID, appreciate input.  - discussed with cards for a TEE as well.   Anemia - chronic, stable  Chronic A fib - continue Coumadin  CAD s/p CABG - stable, no chest pain  AV block s/p pacemaker  HTN - continue home medications  Acute on chronic diastolic heart failure - some vascular congestion on CXR, on room air comfortable in the room , hold Lasix for now given that he received IV contrast in the ED  CKD stage III - Cr close to baseline, received IV contrast, closely monitor  Thrombocytopenia - chronic, stable  Discussed with pt daughter and granddaughter Ethan Lawrence(Danielle Mahaffey, MD - Family medicine [place])  Ethan M. Elvera LennoxGherghe, MD [facility] [phone]

## 2015-02-26 NOTE — ED Notes (Signed)
Admitting MD at bedside.

## 2015-02-26 NOTE — H&P (Addendum)
[facility] Admission History and Physical       [name] WUJ:[id number] DOB: [birth year] DOA: 02/25/2015  Referring physician: EDP PCP: PROVIDER NOT IN SYSTEM  Specialists:   Chief Complaint: Severe Back Pain  HPI: Ethan Lawrence is a 81 y.o. male with a history of CAD, CHF,Chronic Atrial Fib on Coumadin Rx,  AV Block S/P Pacemaker, AS, AAA S/P Repair, HTN who presents to the ED with complaints of sudden onset of Intractable 10/10 Spasmodic low back pain for several days.  He denies any trauma or fall or heavy lifting.   The pain started when he tried to get up from the couch while he was sitting.   He was taken to [facility] and evaluated and found to have fever to 103.2 , and a sepsis workup was initiated, and he was administered 2 grams of IV Rocephin.  A CT scan of the ABD/Pelvis was also performed and was negative for acute findings.    He was sent from the [facility] ED to the [facility] ED in order to get an MRI of the Lumbar Spine to rule out and epidural abscess but an MRI could not be performed since he has a pacemaker.    His antibiotic coverage was expanded and IV Vancomycin was added, and he was administered IV Decadron, and IV Robaxin along with PAin medication.  He was referred for admission.      Review of Systems: Constitutional: No Weight Loss, No Weight Gain, Night Sweats, Fevers, Chills, Dizziness, Light Headedness, Fatigue, or Generalized Weakness HEENT: No Headaches, Difficulty Swallowing,Tooth/Dental Problems,Sore Throat,  No Sneezing, Rhinitis, Ear Ache, Nasal Congestion, or Post Nasal Drip,  Cardio-vascular:  No Chest pain, Orthopnea, PND, Edema in Lower Extremities, Anasarca, Dizziness, Palpitations  Resp: No Dyspnea, No DOE, No Productive Cough, No Non-Productive Cough, No Hemoptysis, No Wheezing.    GI: No Heartburn, Indigestion, Abdominal Pain, Nausea, Vomiting, Diarrhea, Constipation, Hematemesis, Hematochezia, Melena, Change in  Bowel Habits,  Loss of Appetite  GU: No Dysuria, No Change in Color of Urine, No Urgency or Urinary Frequency, No Flank pain.  Musculoskeletal: No Joint Pain or Swelling, No Decreased Range of Motion, +Back Pain.  Neurologic: No Syncope, No Seizures, Muscle Weakness, Paresthesia, Vision Disturbance or Loss, No Diplopia, No Vertigo, No Difficulty Walking,  Skin: No Rash or Lesions. Psych: No Change in Mood or Affect, No Depression or Anxiety, No Memory loss, No Confusion, or Hallucinations   Past Medical History  Diagnosis Date  . Hypertension   . Hyperlipidemia   . CAD (coronary artery disease)     a. 05/2004 CABGx4 (LIMA->LAD, VG->RI, VG->OM, VG->RCA);  b. 08/2012 Cath: Native 3VD with 3/4 patent grafts (VG->RI occluded).  . Atrial fibrillation (HCC)   . Macular degeneration   . Aortic stenosis     a. 08/2012 TEE: EF 45-55%, critical AS, Triv AI, mild to mod MR, Sev TR;  b. 08/2012 AVR (21mm Magna Ease pericardial tissue valve) & TV Repair (30mm MC3 annuloplasty ring).  Marland Kitchen AAA (abdominal aortic aneurysm) (HCC)     a. 06/2010 s/p Endovascular AAA Repair  . 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 Pain     PT HAS A LOT OF LOWER BACK PAIN- PREVIOUS BACK SURGERY AND DJD  . Pacemaker   . Shortness of breath     MOSTLY WITH EXERTION - IMPROVED SINCE HEART VALVE REPLACEMENT  . Arthritis     LEFT  KNEE OA AND PAIN;  DJD LUMBAR  . Anemia requiring transfusions     MOST RECENT WAS JUNE 2014 WITH HEART SURGERY  . Complication of anesthesia     STATES HE DOES WELL WITH DIPROVAN  . Blood transfusion without reported diagnosis   . Cataract   . CHF (congestive heart failure) [facility](HCC)      Past Surgical History  Procedure Laterality Date  . Endovascular stent insertion  07/05/2010  . Appendectomy    . Coronary artery bypass graft    . Vascular surgery    . Back surgery    . Cardiac catheterization    . Esophagogastroduodenoscopy  06/19/2011     Procedure: ESOPHAGOGASTRODUODENOSCOPY (EGD);  Surgeon: Graylin ShiverSalem F Ganem, MD;  [facility];  Service: Gastroenterology;  Laterality: N/A;  . Colonoscopy  06/19/2011    Procedure: COLONOSCOPY;  Surgeon: Graylin ShiverSalem F Ganem, MD;  [facility];  Service: Gastroenterology;  Laterality: N/A;  . Eye surgery      bilater cataracts removed  . Aortic valve replacement N/A 09/01/2012    Procedure: REDO AORTIC VALVE REPLACEMENT (AVR);  Surgeon: Kerin PernaPeter Van Trigt, MD;  [facility];  Service: Open Heart Surgery;  Laterality: N/A;  . Tricuspid valve replacement N/A 09/01/2012    Procedure: TRICUSPID VALVE REPAIR;  Surgeon: Kerin PernaPeter Van Trigt, MD;  [facility];  Service: Open Heart Surgery;  Laterality: N/A;  . Intraoperative transesophageal echocardiogram N/A 09/01/2012    Procedure: INTRAOPERATIVE TRANSESOPHAGEAL ECHOCARDIOGRAM;  Surgeon: Kerin PernaPeter Van Trigt, MD;  [facility];  Service: Open Heart Surgery;  Laterality: N/A;  . Pacemaker insertion  09/07/2012    Medtronic Sensia single chamber pacemaker implanted by Dr Ladona Ridgelaylor   . Surgery about 50 yrs ago for perforated ulcer and repair of hiatal hernia    . Total knee arthroplasty Left 02/15/2013    Procedure: LEFT TOTAL KNEE ARTHROPLASTY;  Surgeon: Shelda PalMatthew D Olin, MD;  Location: [facility] ORS;  Service: Orthopedics;  Laterality: Left;  . Permanent pacemaker insertion N/A 09/07/2012    Procedure: PERMANENT PACEMAKER INSERTION;  Surgeon: Marinus MawGregg W Taylor, MD;  [facility];  Service: Cardiovascular;  Laterality: N/A;  . Hernia repair    . Joint replacement    . Spine surgery    . Cardiac valve replacement    . Prostate surgery        Prior to Admission medications   Medication Sig Start Date End Date Taking? Authorizing Provider  docusate sodium (COLACE) 100 MG capsule Take 100 mg by mouth 2 (two) times daily.   Yes Historical Provider, MD  folic acid (FOLVITE) 1 MG tablet Take 1 mg by mouth daily.    Yes Historical Provider, MD  furosemide (LASIX) 20 MG  tablet Take 20 mg by mouth daily as needed (swelling).   Yes Historical Provider, MD  gabapentin (NEURONTIN) 300 MG capsule Take 300 mg by mouth 2 (two) times daily.   Yes Historical Provider, MD  HYDROcodone-acetaminophen (NORCO) 10-325 MG tablet Take 1-2 tablets by mouth every 4 (four) hours as needed. (PAIN) 02/06/15  Yes Historical Provider, MD  hydrocortisone cream 1 % Apply 1 application topically 2 (two) times daily as needed (for leg itching).   Yes Historical Provider, MD  Multiple Vitamins-Minerals (PRESERVISION AREDS 2) CAPS Take 1 tablet by mouth 2 (two) times daily. Reported on 02/23/2015   Yes Historical Provider, MD  omeprazole (PRILOSEC) 20 MG capsule Take 20 mg by mouth daily.   Yes Historical Provider, MD  predniSONE (DELTASONE)  5 MG tablet Take 1 tablet (5 mg total) by mouth daily with breakfast. 09/13/12  Yes Wayne E Gold, PA-C  rOPINIRole (REQUIP) 1 MG tablet Take 1 mg by mouth daily. 01/09/15  Yes Historical Provider, MD  simvastatin (ZOCOR) 20 MG tablet Take 20 mg by mouth at bedtime.    Yes Historical Provider, MD  traMADol (ULTRAM) 50 MG tablet Take 25 mg by mouth daily. 11/28/14  Yes Historical Provider, MD  warfarin (COUMADIN) 6 MG tablet Take 1 tablet (6 mg total) by mouth every evening. Patient taking differently: Take 6 mg by mouth every evening. Tuesday and Saturday take 3mg , and Monday, Wednesday, Thursday, Friday, and Sunday take 6mg  02/17/13  Yes Lanney Gins, PA-C     Allergies  Allergen Reactions  . Tape Other (See Comments)    Pulls skin off    Social History:  reports that he quit smoking about 39 years ago. He has never used smokeless tobacco. He reports that he does not drink alcohol or use illicit drugs.    Family History  Problem Relation Age of Onset  . Coronary artery disease Brother   . Healthy Mother   . Emphysema Father   . Healthy Sister   . Alzheimer's disease Brother        Physical Exam:  GEN: Pleasant Well Nourished and Well  Developed 81 y.o. Caucasian male examined and in Discomfort but no acute distress; cooperative with exam Filed Vitals:   02/26/15 0430 02/26/15 0500 02/26/15 0534 02/26/15 0543  BP: 98/54 100/62 132/92   Pulse: 80 77 94   Temp:    98.3 F (36.8 C)  TempSrc:    Oral  Resp: 17 16 18    Height:      Weight:      SpO2: 100% 100% 96%    Blood pressure 132/92, pulse 94, temperature 98.3 F (36.8 C), temperature source Oral, resp. rate 18, height 5\' 9"  (1.753 m), weight 81.194 kg (179 lb), SpO2 96 %. PSYCH: He is alert and oriented x4; does not appear anxious does not appear depressed; affect is normal HEENT: Normocephalic and Atraumatic, Mucous membranes pink; PERRLA; EOM intact; Fundi:  Benign;  No scleral icterus, Nares: Patent, Oropharynx: Clear, Fair Dentition,    Neck:  FROM, No Cervical Lymphadenopathy nor Thyromegaly or Carotid Bruit; No JVD; Breasts:: Not examined CHEST WALL: No tenderness CHEST: Normal respiration, clear to auscultation bilaterally HEART: Regular rate and rhythm; no murmurs rubs or gallops BACK: No kyphosis or scoliosis; No CVA tenderness ABDOMEN: Positive Bowel Sounds, Soft Non-Tender, No Rebound or Guarding; No Masses, No Organomegaly Rectal Exam: Not done EXTREMITIES: No Cyanosis, Clubbing, or Edema; No Ulcerations. Genitalia: not examined PULSES: 2+ and symmetric SKIN: Normal hydration no rash or ulceration CNS:  Alert and Oriented x 4, No Focal Deficits Vascular: pulses palpable throughout    Labs on Admission:  Basic Metabolic Panel:  Recent Labs Lab 02/21/15 1615 02/25/15 2045  NA 140 139  K 4.7 4.3  CL 107 108  CO2 23 23  GLUCOSE 120* 118*  BUN 33* 32*  CREATININE 1.76* 1.75*  CALCIUM 8.1* 8.1*   Liver Function Tests:  Recent Labs Lab 02/25/15 2045  AST 17  ALT 10*  ALKPHOS 86  BILITOT 0.8  PROT 6.6  ALBUMIN 3.4*   No results for input(s): LIPASE, AMYLASE in the last 168 hours. No results for input(s): AMMONIA in the last 168  hours. CBC:  Recent Labs Lab 02/21/15 1615 02/23/15 1633 02/25/15 2045  WBC  4.2 5.1 5.4  NEUTROABS 2.9  --  4.3  HGB 9.9* 10.4* 9.3*  HCT 31.5* 31.7* 29.3*  MCV 92.1 88.7 91.8  PLT 99*  --  102*   Cardiac Enzymes: No results for input(s): CKTOTAL, CKMB, CKMBINDEX, TROPONINI in the last 168 hours.  BNP (last 3 results) No results for input(s): BNP in the last 8760 hours.  ProBNP (last 3 results) No results for input(s): PROBNP in the last 8760 hours.  CBG: No results for input(s): GLUCAP in the last 168 hours.  Radiological Exams on Admission: Dg Chest 1 View  02/25/2015  CLINICAL DATA:  81 year old male with back pain for 6 hours. EXAM: CHEST 1 VIEW COMPARISON:  Chest radiograph 01/20/2013. Included lung bases from CT abdomen/pelvis performed concurrently. FINDINGS: Single lead left-sided pacemaker remains in place. Patient is post median sternotomy with 2 prosthetic valves. Multi chamber cardiomegaly, progressed from prior exam. There is tortuosity and atherosclerosis of the thoracic aorta. Vascular congestion with possible mild perihilar edema. Calcified granuloma in the right upper lung. Small pleural effusions on CT are not seen radiographically. No confluent airspace disease. No pneumothorax. IMPRESSION: Cardiomegaly with vascular congestion and questionable perihilar edema. Recommend correlation for CHF. Electronically Signed   By: Rubye Oaks M.D.   On: 02/25/2015 22:30   Dg Thoracic Spine 2 View  02/25/2015  CLINICAL DATA:  Lower back pain since 4:30 this afternoon. History of chronic back pain. No injury noted. EXAM: THORACIC SPINE 2 VIEWS COMPARISON:  Chest x-ray dated 01/20/2013. FINDINGS: Portions of the thoracic spine are incompletely seen due to overlying osseous and soft tissue structures. Overall osseous alignment appears normal and grossly stable compared to a previous chest x-ray of 01/20/2013. No osseous fracture or dislocation seen. No acute - appearing  cortical irregularity or osseous lesion. Paravertebral soft tissues are unremarkable for acute process. IMPRESSION: No acute findings. Electronically Signed   By: Bary Richard M.D.   On: 02/25/2015 22:29   Dg Lumbar Spine Complete  02/25/2015  CLINICAL DATA:  81 year old male with lumbosacral back pain for 6 hours. EXAM: LUMBAR SPINE - COMPLETE 4+ VIEW COMPARISON:  Reformats from CT abdomen/ pelvis 09/02/2011. FINDINGS: Kyphoplasty within L4 compression deformity. Remaining vertebral body heights are maintained. Mild levoscoliosis centered at L2-L3 with associated degenerative disc disease. Additional disc space narrowing at L1-L2. The bones are under mineralized. Aorto bi-iliac stent in place. IMPRESSION: 1. Kyphoplasty within L4 compression deformity. 2. Scoliosis and degenerative change.  No acute bony abnormality. Electronically Signed   By: Rubye Oaks M.D.   On: 02/25/2015 22:26   Ct Abdomen Pelvis W Contrast  02/25/2015  CLINICAL DATA:  Severe low back pain with any movement. History of chronic back pain. Abdominal pain and tenderness. EXAM: CT ABDOMEN AND PELVIS WITH CONTRAST TECHNIQUE: Multidetector CT imaging of the abdomen and pelvis was performed using the standard protocol following bolus administration of intravenous contrast. CONTRAST:  70mL OMNIPAQUE IOHEXOL 300 MG/ML  SOLN COMPARISON:  09/02/2011 FINDINGS: Small bilateral pleural effusions. Multiple surgical clips at the EG junction. Postoperative changes in the mediastinum. Surgical absence of the gallbladder. Calcified granulomas scattered throughout the liver and spleen. No hepatosplenomegaly. The pancreas, adrenal glands, inferior vena cava, and retroperitoneal lymph nodes are unremarkable. Abdominal aortic aneurysm post aortoiliac stent grafts. Native sac diameter is 5.6 cm. This measurement is similar to the previous study. Kidneys are atrophic bilaterally. Renal nephrograms appear symmetrical. No hydronephrosis. Large cysts in  both kidneys, largest is off of the lower pole of the left  kidney and measures about 8.9 cm diameter. This cyst is increased in size since the previous study. The stomach, small bowel, and colon are not abnormally distended. The colon is diffusely stool-filled. No free air or free fluid in the abdomen. Surgical clips in the anterior abdominal wall. Pelvis: Prostate gland is not enlarged. Prostate defect may indicate previous TUR procedure. Bladder wall is not thickened. Appendix is surgically absent. No free or loculated pelvic fluid collections. No pelvic mass or lymphadenopathy. Vascular calcifications in the pelvis. Fat in the inguinal canals. Degenerative changes in the lumbar spine. L4 compression deformity post kyphoplasty. No destructive bone lesions. IMPRESSION: No significant acute process demonstrated in the abdomen or pelvis. There are small bilateral pleural effusions. Postoperative changes demonstrated throughout the chest, abdomen, and pelvis. Abdominal aortic aneurysm post stent graft. Native sac diameter is 5.6 cm without change since prior study. Multiple renal cysts with enlarging large cyst on the left kidney. Electronically Signed   By: Burman Nieves M.D.   On: 02/25/2015 22:42           Assessment/Plan:      81 y.o. male with  Principal Problem:   1.      Intractable back pain- due to MSK, vs Infectious component of Vertebrae   Pain control with IV Dilaudid   IV Robaxin   IV Decadron x 1      Active Problems:   2.     Febrile illness/SIRS   Sepsis Workup   IV Vancomycin   IV Rocephin     3.     Anemia   Anemia Panel   Send FOBT     4.     Chronic a-fib on Coumadin   Continue Coumadin   Check Pt/INR     Pharmacy adjustment PRN     5.    CAD (coronary artery disease) s/p CABG   stable     6.    HTN (hypertension)   Continue Lasix Rx     7.    Chronic diastolic heart failure (HCC)   Continue Lasix Rx     8.    DVT Prophylaxis   On Coumadin  Rx     Code Status:     FULL CODE      Family Communication:   Daughter at Bedside     Disposition Plan:    Inpatient Status        Time spent:  20 Minutes      Ron Parker [facility] Pager [phone]   If 7AM -7PM Please Contact the Day Rounding Team MD for [facility]  If 7PM-7AM, Please Contact Night-Floor Coverage  [email or web address] Password [email or web address] 02/26/2015, 6:10 AM     ADDENDUM:   Patient was seen and examined on 02/26/2015

## 2015-02-26 NOTE — Progress Notes (Signed)
ANTICOAGULATION CONSULT NOTE - Follow Up Consult  Pharmacy Consult for coumadin Indication: atrial fibrillation  Allergies  Allergen Reactions  . Tape Other (See Comments)    Pulls skin off    Patient Measurements: Height: 5\' 10"  (177.8 cm) Weight: 180 lb 6.4 oz (81.829 kg) IBW/kg (Calculated) : 73   Vital Signs: Temp: 98.4 F (36.9 C) (12/18 1019) Temp Source: Oral (12/18 1019) BP: 132/77 mmHg (12/18 1019) Pulse Rate: 94 (12/18 1019)  Labs:  Recent Labs  02/23/15 1633 02/25/15 2045 02/26/15 0713  HGB 10.4* 9.3*  --   HCT 31.7* 29.3*  --   PLT  --  102*  --   APTT  --   --  60*  LABPROT  --   --  25.9*  INR  --   --  2.40*  CREATININE  --  1.75*  --     Estimated Creatinine Clearance: 29 mL/min (by C-G formula based on Cr of 1.75).    Assessment: 81 y.o. F presents with severe back pain. She is on coumadin PTA for afib and pharmacy consulted to dose -INR= 2.4 and at goal  Home dose: 6mg  daily except 3 mg on Tues and Sat  Goal of Therapy:  INR 2-3 Monitor platelets by anticoagulation protocol: Yes   Plan:  -Coumadin 6mg  po today -Daily PT/INR for now  Harland Germanndrew Ondra Deboard, Pharm D 02/26/2015 12:47 PM

## 2015-02-26 NOTE — ED Notes (Signed)
Attempted to call report, no answer on floor.

## 2015-02-26 NOTE — Progress Notes (Signed)
CRITICAL VALUE ALERT  Critical value received:  Gram positive cocci in pairs in both [facility]15 at 2045 and 2115.  Date of notification:  02/26/15  Time of notification:  1210pm  Critical value read back:Yes.    Nurse who received alert:  Haig ProphetKelly Derold Dorsch, RN  MD notified (1st page):  Dr. Wendy Poet. Gherghe  Time of first page:  1214  MD notified (2nd page): N/A, MD responded to first page.  Time of second page: N/A  Responding MD:  Dr. Wendy Poet. Gherghe.   Time MD responded:  1215

## 2015-02-26 NOTE — ED Notes (Signed)
MRI tech informed RN pt cannot have a MRI due to pacemaker. Dr.Pollina aware

## 2015-02-26 NOTE — Progress Notes (Signed)
Patient admitted to unit from E.D . Patient alert and oriented X4. Patient complaining of back pain with movement. Oriented to room and unit.

## 2015-02-26 NOTE — Progress Notes (Signed)
ANTIBIOTIC/ANTICOAGULATION CONSULT NOTE - INITIAL  Pharmacy Consult for Vancomycin and Zosyn and Coumadin Indication: osteomyelitis and afib  Allergies  Allergen Reactions  . Tape Other (See Comments)    Pulls skin off    Patient Measurements: Height: 5\' 9"  (175.3 cm) Weight: 179 lb (81.194 kg) IBW/kg (Calculated) : 70.7  Vital Signs: Temp: 98.3 F (36.8 C) (12/18 0543) Temp Source: Oral (12/18 0543) BP: 132/92 mmHg (12/18 0534) Pulse Rate: 94 (12/18 0534) Intake/Output from previous day:   Intake/Output from this shift:    Labs:  Recent Labs  02/23/15 1633 02/25/15 2045  WBC 5.1 5.4  HGB 10.4* 9.3*  PLT  --  102*  CREATININE  --  1.75*   Estimated Creatinine Clearance: 28.1 mL/min (by C-G formula based on Cr of 1.75). No results for input(s): VANCOTROUGH, VANCOPEAK, VANCORANDOM, GENTTROUGH, GENTPEAK, GENTRANDOM, TOBRATROUGH, TOBRAPEAK, TOBRARND, AMIKACINPEAK, AMIKACINTROU, AMIKACIN in the last 72 hours.   Microbiology: No results found for this or any previous visit (from the past 720 hour(s)).  Medical History: Past Medical History  Diagnosis Date  . Hypertension   . Hyperlipidemia   . CAD (coronary artery disease)     a. 05/2004 CABGx4 (LIMA->LAD, VG->RI, VG->OM, VG->RCA);  b. 08/2012 Cath: Native 3VD with 3/4 patent grafts (VG->RI occluded).  . Atrial fibrillation (HCC)   . Macular degeneration   . Aortic stenosis     a. 08/2012 TEE: EF 45-55%, critical AS, Triv AI, mild to mod MR, Sev TR;  b. 08/2012 AVR (21mm Magna Ease pericardial tissue valve) & TV Repair (30mm MC3 annuloplasty ring).  Marland Kitchen. AAA (abdominal aortic aneurysm) (HCC)     a. 06/2010 s/p Endovascular AAA Repair  . 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 Pain     PT HAS A LOT OF LOWER BACK PAIN- PREVIOUS BACK SURGERY AND DJD  . Pacemaker   . Shortness of breath     MOSTLY WITH EXERTION - IMPROVED SINCE HEART VALVE REPLACEMENT  . Arthritis     LEFT KNEE OA AND PAIN;  DJD LUMBAR  . Anemia requiring transfusions     MOST RECENT WAS JUNE 2014 WITH HEART SURGERY  . Complication of anesthesia     STATES HE DOES WELL WITH DIPROVAN  . Blood transfusion without reported diagnosis   . Cataract   . CHF (congestive heart failure) (HCC)     Medications:  Prescriptions prior to admission  Medication Sig Dispense Refill Last Dose  . docusate sodium (COLACE) 100 MG capsule Take 100 mg by mouth 2 (two) times daily.   02/25/2015 at Unknown time  . folic acid (FOLVITE) 1 MG tablet Take 1 mg by mouth daily.    02/25/2015 at Unknown time  . furosemide (LASIX) 20 MG tablet Take 20 mg by mouth daily as needed (swelling).   2 weeks  . gabapentin (NEURONTIN) 300 MG capsule Take 300 mg by mouth 2 (two) times daily.   02/25/2015 at Unknown time  . HYDROcodone-acetaminophen (NORCO) 10-325 MG tablet Take 1-2 tablets by mouth every 4 (four) hours as needed. (PAIN)   02/25/2015 at 1600  . hydrocortisone cream 1 % Apply 1 application topically 2 (two) times daily as needed (for leg itching).   Past Month at Unknown time  . Multiple Vitamins-Minerals (PRESERVISION AREDS 2) CAPS Take 1 tablet by mouth 2 (two) times daily. Reported on 02/23/2015   02/25/2015 at Unknown time  . omeprazole (PRILOSEC) 20 MG capsule  Take 20 mg by mouth daily.   02/25/2015 at Unknown time  . predniSONE (DELTASONE) 5 MG tablet Take 1 tablet (5 mg total) by mouth daily with breakfast. 30 tablet 1 02/25/2015 at Unknown time  . rOPINIRole (REQUIP) 1 MG tablet Take 1 mg by mouth daily.   02/24/2015 at Unknown time  . simvastatin (ZOCOR) 20 MG tablet Take 20 mg by mouth at bedtime.    02/24/2015 at Unknown time  . traMADol (ULTRAM) 50 MG tablet Take 25 mg by mouth daily.   02/25/2015 at Unknown time  . warfarin (COUMADIN) 6 MG tablet Take 1 tablet (6 mg total) by mouth every evening. (Patient taking differently: Take 6 mg by mouth every evening. Tuesday and Saturday take , and Monday,  Wednesday, Thursday, Friday, and Sunday take ) 30 tablet 0 02/24/2015 at 2000   Assessment: 81 y.o. F presents with severe back pain.   AC: Pt on coumadin PTA for afib. Baseline INR pending. Hgb low but stable. Plt also low but stable. Home dose:  daily except 3 mg on Tues and Sat  ID: Vancomycin  IV given in ED ~0540 and Rocephin 2gm given in ED 12/17 ~2100. To continue Vancomycin and start Zosyn for osteomyelitis. WBC wnl.   Nephrology: SCr 1.75, est CrCl 28 ml/min.  Goal of Therapy:  Vancomycin trough 15-20 mcg/ml INR 2-3  Plan:  Zosyn 3.375gm IV now over 30 min then 3.375gm IV q8h - subsequent doses over 4 hours Vancomycin 1gm IV q24h Will f/u micro data, renal function, and pt's clinical condition Vanc trough prn INR now and daily  Christoper Fabian, PharmD, BCPS Clinical pharmacist, pager [phone] 02/26/2015,6:55 AM

## 2015-02-26 NOTE — ED Provider Notes (Addendum)
Medical screening exam:  Patient transferred from was a long ED for MRI. Patient was seen tonight for complaints of lower abdominal pain on the right side which he suspects is secondary to his hernia. Patient reports that there was a large lump in right groin earlier at the area where he has a hernia. He was experiencing increasing pain. He reports that he was able to push the lump back in and is now resolved. He reports that he has hernia repair scheduled for next month.  Patient was evaluated including labs and CT scan. He has a normal white count of 5.4. Comprehensive panel was unremarkable other than mildly elevated BUN and creatinine. Lactic acid 1.15, no sign of acute sepsis. He was, however, febrile in the ER.  Complaining of increased back pain. Patient reports that he had sudden onset of severe pain in his lower back when he tried to get up off the couch earlier. He does have a history of chronic back pain and has had previous surgery by Dr. Jordan LikesPool. Because of the fever and low back pain, epidural abscess and axial skeleton infection was considered. He was transferred to the ER at [facility] for MRI. Unfortunately, however, patient has a pacemaker. MRI is not possible.  I did discuss the CT scanner with the reading radiologist, Dr. Andria MeuseStevens. He does not see any obvious signs of infection in the disc and bone structure of the back, cannot tell if there is any evidence of epidural abscess or deeper infection on CT.  Patient has had multiple doses of analgesia in the ER. His pain is well controlled at rest, but he has severe and uncontrolled pain with any movement, despite multiple doses of analgesia. He will require hospitalization for further management of pain, consideration by neurosurgery in the morning.    Face to face Exam: HEENT - PERRLA Lungs - CTAB Heart - RRR, no M/R/G Abd - S/NT/ND Neuro - alert, oriented x3; decreased raising the legs against gravity bilaterally secondary to  painful inhibition, strength is symmetric. No foot drop. No saddle anesthesia.  Results for orders placed or performed during the hospital encounter of 02/25/15  CBC with Differential  Result Value Ref Range   WBC 5.4 4.0 - 10.5 K/uL   RBC 3.19 (L) 4.22 - 5.81 MIL/uL   Hemoglobin 9.3 (L) 13.0 - 17.0 g/dL   HCT 16.129.3 (L) 09.639.0 - 04.552.0 %   MCV 91.8 78.0 - 100.0 fL   MCH 29.2 26.0 - 34.0 pg   MCHC 31.7 30.0 - 36.0 g/dL   RDW 40.913.5 81.111.5 - 91.415.5 %   Platelets 102 (L) 150 - 400 K/uL   Neutrophils Relative % 80 %   Neutro Abs 4.3 1.7 - 7.7 K/uL   Lymphocytes Relative 12 %   Lymphs Abs 0.6 (L) 0.7 - 4.0 K/uL   Monocytes Relative 8 %   Monocytes Absolute 0.4 0.1 - 1.0 K/uL   Eosinophils Relative 0 %   Eosinophils Absolute 0.0 0.0 - 0.7 K/uL   Basophils Relative 0 %   Basophils Absolute 0.0 0.0 - 0.1 K/uL  Comprehensive metabolic panel  Result Value Ref Range   Sodium 139 135 - 145 mmol/L   Potassium 4.3 3.5 - 5.1 mmol/L   Chloride 108 101 - 111 mmol/L   CO2 23 22 - 32 mmol/L   Glucose, Bld 118 (H) 65 - 99 mg/dL   BUN 32 (H) 6 - 20 mg/dL   Creatinine, Ser 7.821.75 (H) 0.61 - 1.24 mg/dL  Calcium 8.1 (L) 8.9 - 10.3 mg/dL   Total Protein 6.6 6.5 - 8.1 g/dL   Albumin 3.4 (L) 3.5 - 5.0 g/dL   AST 17 15 - 41 U/L   ALT 10 (L) 17 - 63 U/L   Alkaline Phosphatase 86 38 - 126 U/L   Total Bilirubin 0.8 0.3 - 1.2 mg/dL   GFR calc non Af Amer 33 (L) >60 mL/min   GFR calc Af Amer 38 (L) >60 mL/min   Anion gap 8 5 - 15  Urinalysis, Routine w reflex microscopic (not at [facility])  Result Value Ref Range   Color, Urine YELLOW YELLOW   APPearance CLEAR CLEAR   Specific Gravity, Urine 1.027 1.005 - 1.030   pH 6.0 5.0 - 8.0   Glucose, UA NEGATIVE NEGATIVE mg/dL   Hgb urine dipstick SMALL (A) NEGATIVE   Bilirubin Urine NEGATIVE NEGATIVE   Ketones, ur NEGATIVE NEGATIVE mg/dL   Protein, ur 161 (A) NEGATIVE mg/dL   Nitrite NEGATIVE NEGATIVE   Leukocytes, UA NEGATIVE NEGATIVE  Sedimentation rate  Result  Value Ref Range   Sed Rate 58 (H) 0 - 16 mm/hr  Urine microscopic-add on  Result Value Ref Range   Squamous Epithelial / LPF 0-5 (A) NONE SEEN   WBC, UA 6-30 0 - 5 WBC/hpf   RBC / HPF 6-30 0 - 5 RBC/hpf   Bacteria, UA FEW (A) NONE SEEN  I-Stat CG4 Lactic Acid, ED  (not at  [facility])  Result Value Ref Range   Lactic Acid, Venous 1.15 0.5 - 2.0 mmol/L  I-Stat CG4 Lactic Acid, ED  (not at  [facility])  Result Value Ref Range   Lactic Acid, Venous 0.80 0.5 - 2.0 mmol/L   Dg Chest 1 View  02/25/2015  CLINICAL DATA:  81 year old male with back pain for 6 hours. EXAM: CHEST 1 VIEW COMPARISON:  Chest radiograph 01/20/2013. Included lung bases from CT abdomen/pelvis performed concurrently. FINDINGS: Single lead left-sided pacemaker remains in place. Patient is post median sternotomy with 2 prosthetic valves. Multi chamber cardiomegaly, progressed from prior exam. There is tortuosity and atherosclerosis of the thoracic aorta. Vascular congestion with possible mild perihilar edema. Calcified granuloma in the right upper lung. Small pleural effusions on CT are not seen radiographically. No confluent airspace disease. No pneumothorax. IMPRESSION: Cardiomegaly with vascular congestion and questionable perihilar edema. Recommend correlation for CHF. Electronically Signed   By: Rubye Oaks M.D.   On: 02/25/2015 22:30   Dg Thoracic Spine 2 View  02/25/2015  CLINICAL DATA:  Lower back pain since 4:30 this afternoon. History of chronic back pain. No injury noted. EXAM: THORACIC SPINE 2 VIEWS COMPARISON:  Chest x-ray dated 01/20/2013. FINDINGS: Portions of the thoracic spine are incompletely seen due to overlying osseous and soft tissue structures. Overall osseous alignment appears normal and grossly stable compared to a previous chest x-ray of 01/20/2013. No osseous fracture or dislocation seen. No acute - appearing cortical irregularity or osseous lesion. Paravertebral soft tissues are unremarkable for acute process.  IMPRESSION: No acute findings. Electronically Signed   By: Bary Richard M.D.   On: 02/25/2015 22:29   Dg Lumbar Spine Complete  02/25/2015  CLINICAL DATA:  81 year old male with lumbosacral back pain for 6 hours. EXAM: LUMBAR SPINE - COMPLETE 4+ VIEW COMPARISON:  Reformats from CT abdomen/ pelvis 09/02/2011. FINDINGS: Kyphoplasty within L4 compression deformity. Remaining vertebral body heights are maintained. Mild levoscoliosis centered at L2-L3 with associated degenerative disc disease. Additional disc space narrowing at L1-L2. The bones  are under mineralized. Aorto bi-iliac stent in place. IMPRESSION: 1. Kyphoplasty within L4 compression deformity. 2. Scoliosis and degenerative change.  No acute bony abnormality. Electronically Signed   By: Rubye Oaks M.D.   On: 02/25/2015 22:26   Ct Abdomen Pelvis W Contrast  02/25/2015  CLINICAL DATA:  Severe low back pain with any movement. History of chronic back pain. Abdominal pain and tenderness. EXAM: CT ABDOMEN AND PELVIS WITH CONTRAST TECHNIQUE: Multidetector CT imaging of the abdomen and pelvis was performed using the standard protocol following bolus administration of intravenous contrast. CONTRAST:  70mL OMNIPAQUE IOHEXOL 300 MG/ML  SOLN COMPARISON:  09/02/2011 FINDINGS: Small bilateral pleural effusions. Multiple surgical clips at the EG junction. Postoperative changes in the mediastinum. Surgical absence of the gallbladder. Calcified granulomas scattered throughout the liver and spleen. No hepatosplenomegaly. The pancreas, adrenal glands, inferior vena cava, and retroperitoneal lymph nodes are unremarkable. Abdominal aortic aneurysm post aortoiliac stent grafts. Native sac diameter is 5.6 cm. This measurement is similar to the previous study. Kidneys are atrophic bilaterally. Renal nephrograms appear symmetrical. No hydronephrosis. Large cysts in both kidneys, largest is off of the lower pole of the left kidney and measures about 8.9 cm diameter.  This cyst is increased in size since the previous study. The stomach, small bowel, and colon are not abnormally distended. The colon is diffusely stool-filled. No free air or free fluid in the abdomen. Surgical clips in the anterior abdominal wall. Pelvis: Prostate gland is not enlarged. Prostate defect may indicate previous TUR procedure. Bladder wall is not thickened. Appendix is surgically absent. No free or loculated pelvic fluid collections. No pelvic mass or lymphadenopathy. Vascular calcifications in the pelvis. Fat in the inguinal canals. Degenerative changes in the lumbar spine. L4 compression deformity post kyphoplasty. No destructive bone lesions. IMPRESSION: No significant acute process demonstrated in the abdomen or pelvis. There are small bilateral pleural effusions. Postoperative changes demonstrated throughout the chest, abdomen, and pelvis. Abdominal aortic aneurysm post stent graft. Native sac diameter is 5.6 cm without change since prior study. Multiple renal cysts with enlarging large cyst on the left kidney. Electronically Signed   By: Burman Nieves M.D.   On: 02/25/2015 22:42      Plan: Severe lumbar pain, admit to medicine for pain management, neurosurgical consultation in a.m.  Addendum: After arrangements made for patient to be admitted to medicine, patient started having increased intermittent severe spasms in the right lower back. This occurred after he urinated and had a small amount of blood and a small clot in his urine. Etiology of this is unclear. Suspect infectious etiology of hematuria, as there was no evidence of genitourinary tract obstruction, hydronephrosis or ureterolithiasis on CAT scan performed earlier today. Patient received Rocephin prior to transfer from [facility] which should cover the urine. Blood and urine cultures pending.  Gilda Crease, MD 02/26/15 1610  Gilda Crease, MD 02/26/15 [id number]  Gilda Crease, MD 02/26/15 0630

## 2015-02-26 NOTE — Progress Notes (Signed)
Utilization Review Completed.Tyliek Timberman T12/18/2016  

## 2015-02-26 NOTE — ED Notes (Signed)
Pt given coffee to drink per EDP. Requesting more pain medication

## 2015-02-27 ENCOUNTER — Encounter (HOSPITAL_COMMUNITY): Payer: Self-pay

## 2015-02-27 ENCOUNTER — Encounter (HOSPITAL_COMMUNITY)
Admission: EM | Disposition: A | Discharge status: Skilled Nursing Facility | Payer: Self-pay | Source: Home / Self Care | Attending: Internal Medicine

## 2015-02-27 ENCOUNTER — Inpatient Hospital Stay (HOSPITAL_COMMUNITY): Payer: Medicare PPO

## 2015-02-27 DIAGNOSIS — M545 Low back pain, unspecified: Secondary | ICD-10-CM | POA: Insufficient documentation

## 2015-02-27 DIAGNOSIS — A499 Bacterial infection, unspecified: Secondary | ICD-10-CM

## 2015-02-27 DIAGNOSIS — I350 Nonrheumatic aortic (valve) stenosis: Secondary | ICD-10-CM

## 2015-02-27 DIAGNOSIS — A419 Sepsis, unspecified organism: Secondary | ICD-10-CM

## 2015-02-27 DIAGNOSIS — R7881 Bacteremia: Secondary | ICD-10-CM | POA: Insufficient documentation

## 2015-02-27 DIAGNOSIS — T827XXA Infection and inflammatory reaction due to other cardiac and vascular devices, implants and grafts, initial encounter: Secondary | ICD-10-CM | POA: Insufficient documentation

## 2015-02-27 DIAGNOSIS — Z952 Presence of prosthetic heart valve: Secondary | ICD-10-CM

## 2015-02-27 DIAGNOSIS — Z950 Presence of cardiac pacemaker: Secondary | ICD-10-CM

## 2015-02-27 HISTORY — PX: TEE WITHOUT CARDIOVERSION: SHX5443

## 2015-02-27 LAB — BASIC METABOLIC PANEL
Anion gap: 8 (ref 5–15)
BUN: 26 mg/dL — AB (ref 6–20)
CHLORIDE: 110 mmol/L (ref 101–111)
CO2: 20 mmol/L — ABNORMAL LOW (ref 22–32)
CREATININE: 1.73 mg/dL — AB (ref 0.61–1.24)
Calcium: 8.9 mg/dL (ref 8.9–10.3)
GFR calc Af Amer: 38 mL/min — ABNORMAL LOW (ref >60)
GFR calc non Af Amer: 33 mL/min — ABNORMAL LOW (ref >60)
GLUCOSE: 101 mg/dL — AB (ref 65–99)
Potassium: 4.5 mmol/L (ref 3.5–5.1)
SODIUM: 138 mmol/L (ref 135–145)

## 2015-02-27 LAB — CBC
HEMATOCRIT: 30 % — AB (ref 39.0–52.0)
HEMATOCRIT: 28.9 % — AB (ref 39.0–52.0)
Hemoglobin: 9.2 g/dL — ABNORMAL LOW (ref 13.0–17.0)
Hemoglobin: 9.1 g/dL — ABNORMAL LOW (ref 13.0–17.0)
MCH: 28.8 pg (ref 26.0–34.0)
MCH: 29 pg (ref 26.0–34.0)
MCHC: 30.7 g/dL (ref 30.0–36.0)
MCHC: 31.5 g/dL (ref 30.0–36.0)
MCV: 93.8 fL (ref 78.0–100.0)
MCV: 92 fL (ref 78.0–100.0)
PLATELETS: 122 10*3/uL — AB (ref 150–400)
PLATELETS: 120 10*3/uL — AB (ref 150–400)
RBC: 3.2 MIL/uL — ABNORMAL LOW (ref 4.22–5.81)
RBC: 3.14 MIL/uL — ABNORMAL LOW (ref 4.22–5.81)
RDW: 13.6 % (ref 11.5–15.5)
RDW: 13.6 % (ref 11.5–15.5)
WBC: 7 10*3/uL (ref 4.0–10.5)
WBC: 7.8 10*3/uL (ref 4.0–10.5)

## 2015-02-27 LAB — URINE CULTURE: Culture: 8000

## 2015-02-27 LAB — PROTIME-INR
INR: 2.12 — ABNORMAL HIGH (ref 0.00–1.49)
Prothrombin Time: 23.6 seconds — ABNORMAL HIGH (ref 11.6–15.2)

## 2015-02-27 SURGERY — ECHOCARDIOGRAM, TRANSESOPHAGEAL
Anesthesia: Moderate Sedation

## 2015-02-27 MED ORDER — FENTANYL CITRATE (PF) 100 MCG/2ML IJ SOLN
INTRAMUSCULAR | Status: AC
  Filled 2015-02-27: qty 2

## 2015-02-27 MED ORDER — LIDOCAINE VISCOUS 2 % MT SOLN
OROMUCOSAL | Status: AC
  Filled 2015-02-27: qty 15

## 2015-02-27 MED ORDER — MIDAZOLAM HCL 10 MG/2ML IJ SOLN
INTRAMUSCULAR | Status: DC | PRN
  Administered 2015-02-27 (×4): 1 mg via INTRAVENOUS

## 2015-02-27 MED ORDER — LIDOCAINE VISCOUS 2 % MT SOLN
OROMUCOSAL | Status: DC | PRN
  Administered 2015-02-27: 1 via OROMUCOSAL

## 2015-02-27 MED ORDER — WARFARIN SODIUM 6 MG PO TABS
6.0000 mg | ORAL_TABLET | Freq: Once | ORAL | Status: AC
  Filled 2015-02-27: qty 1

## 2015-02-27 MED ORDER — ZOLPIDEM TARTRATE 5 MG PO TABS
5.0000 mg | ORAL_TABLET | Freq: Once | ORAL | Status: AC
  Administered 2015-02-27: 5 mg via ORAL
  Filled 2015-02-27: qty 1

## 2015-02-27 MED ORDER — SODIUM CHLORIDE 0.9 % IV SOLN
3.0000 g | Freq: Two times a day (BID) | INTRAVENOUS | Status: DC
  Administered 2015-02-27 – 2015-02-28 (×2): 3 g via INTRAVENOUS
  Filled 2015-02-27 (×3): qty 3

## 2015-02-27 MED ORDER — FENTANYL CITRATE (PF) 100 MCG/2ML IJ SOLN
INTRAMUSCULAR | Status: DC | PRN
  Administered 2015-02-27: 12.5 ug via INTRAVENOUS
  Administered 2015-02-27: 25 ug via INTRAVENOUS
  Administered 2015-02-27 (×2): 12.5 ug via INTRAVENOUS

## 2015-02-27 MED ORDER — MIDAZOLAM HCL 5 MG/ML IJ SOLN
INTRAMUSCULAR | Status: AC
  Filled 2015-02-27: qty 1

## 2015-02-27 NOTE — Consult Note (Signed)
We were consulted regarding the patients hernia.  The patient states he was getting into/out of a truck when his hernia popped out.  It subsequently went back in, but he still has mild tenderness in his right groin.  He is not obtructed.  His abdomen is soft and only a tiny area of tenderness at the top of the inguinal canal.  The hernia has reduced and area is soft.  It is not the source of his bacteremia.  He is pending TEE and other workup.  He was hoping to get surgery on his hernia while he's here, but I informed him that he can not have surgery while he has bacteremia.  His age and co-morbidities will also affect this.  He will be on IV antibiotics for up to 2 months and will not be able to have elective mesh hernia repair until the infection can be cleared.  He has an appt with Dr. Magnus IvanBlackman on 03/17/15 which I have instructed him to keep and they can hopefully schedule elective surgery in the future.  He apparently lives in [place], but his daughter lives here in [place] and he got his heart surgery here.  I've instructed him to not lift/push/pull/carry >5lbs until told otherwise he can lift to prevent re-occurrence.  If his hernia were to strangulate/incarcerate he would not be able to have mesh placed while he has bacteremia.  He and his daughter understand and are agreeable to holding off.     Jorje GuildMegan Briyanna Billingham, PA-C [facility] Surgery [facility] [phone]

## 2015-02-27 NOTE — Progress Notes (Addendum)
[facility]    Subjective: C/o severe lower back pain   Antibiotics:  Anti-infectives    Start     Dose/Rate Route Frequency Ordered Stop   02/27/15 0600  vancomycin (VANCOCIN) IVPB 1000 mg/200 mL premix     1,000 mg 200 mL/hr over 60 Minutes Intravenous Every 24 hours 02/26/15 0706     02/26/15 1500  piperacillin-tazobactam (ZOSYN) IVPB 3.375 g     3.375 g 12.5 mL/hr over 240 Minutes Intravenous 3 times per day 02/26/15 0706     02/26/15 0730  piperacillin-tazobactam (ZOSYN) IVPB 3.375 g     3.375 g 100 mL/hr over 30 Minutes Intravenous STAT 02/26/15 0706 02/26/15 1105   02/26/15 0530  vancomycin (VANCOCIN) 1,250 mg in sodium chloride 0.9 % 250 mL IVPB     1,250 mg 166.7 mL/hr over 90 Minutes Intravenous  Once 02/26/15 0517 02/26/15 0712   02/25/15 2045  cefTRIAXone (ROCEPHIN) 2 g in dextrose 5 % 50 mL IVPB     2 g 100 mL/hr over 30 Minutes Intravenous  Once 02/25/15 2040 02/25/15 2244      Medications: Scheduled Meds: . clotrimazole   Topical BID  . docusate sodium  100 mg Oral BID  . folic acid  1 mg Oral Daily  . gabapentin  300 mg Oral BID  . multivitamin-lutein  1 capsule Oral BID  . pantoprazole  40 mg Oral Daily  . piperacillin-tazobactam (ZOSYN)  IV  3.375 g Intravenous 3 times per day  . predniSONE  5 mg Oral Q breakfast  . rOPINIRole  1 mg Oral Daily  . simvastatin  20 mg Oral QHS  . sodium chloride  3 mL Intravenous Q12H  . vancomycin  1,000 mg Intravenous Q24H  . warfarin  6 mg Oral ONCE-1800  . Warfarin - Pharmacist Dosing Inpatient   Does not apply q1800   Continuous Infusions:  PRN Meds:.sodium chloride, acetaminophen **OR** acetaminophen, alum & mag hydroxide-simeth, HYDROmorphone (DILAUDID) injection, HYDROmorphone (DILAUDID) injection, methocarbamol, ondansetron **OR** ondansetron (ZOFRAN) IV, oxyCODONE, oxyCODONE-acetaminophen, sodium chloride    Objective: Weight change:   Intake/Output Summary  (Last 24 hours) at 02/27/15 1849 Last data filed at 02/27/15 0654  Gross per 24 hour  Intake      0 ml  Output    825 ml  Net   -825 ml   Blood pressure 119/63, pulse 82, temperature 99.2 F (37.3 C), temperature source Oral, resp. rate 20, height 5\' 10"  (1.778 m), weight 180 lb 6.4 oz (81.829 kg), SpO2 96 %. Temp:  [97.7 F (36.5 C)-99.2 F (37.3 C)] 99.2 F (37.3 C) (12/19 1744) Pulse Rate:  [25-100] 82 (12/19 1744) Resp:  [15-28] 20 (12/19 1744) BP: (104-175)/(51-119) 119/63 mmHg (12/19 1744) SpO2:  [82 %-100 %] 96 % (12/19 1744)  Physical Exam: General: Alert and awake, not in any acute distress. HEENT: anicteric sclera, EOMI OP clear, he has teeth on lower mandible  CVS irr irregular rate, normal r,  2/VI murmur at RUSB Chest: clear to auscultation bilaterally, no wheezing, rales or rhonchi Abdomen: soft nontender, nondistended, normal bowel sounds, Extremities 2+ edema Neuro: nonfocal  CBC: CBC Latest Ref Rng 02/27/2015 02/27/2015 02/25/2015  WBC 4.0 - 10.5 K/uL 7.8 7.0 5.4  Hemoglobin 13.0 - 17.0 g/dL 4.0(J) 8.1(X) 9.1(Y)  Hematocrit 39.0 - 52.0 % 28.9(L) 30.0(L) 29.3(L)  Platelets 150 - 400 K/uL 120(L) 122(L) 102(L)       BMET  Recent Labs  02/25/15 2045 02/27/15 0750  NA 139 138  K 4.3 4.5  CL 108 110  CO2 23 20*  GLUCOSE 118* 101*  BUN 32* 26*  CREATININE 1.75* 1.73*  CALCIUM 8.1* 8.9     Liver Panel   Recent Labs  02/25/15 2045  PROT 6.6  ALBUMIN 3.4*  AST 17  ALT 10*  ALKPHOS 86  BILITOT 0.8       Sedimentation Rate  Recent Labs  02/25/15 2045  ESRSEDRATE 58*   C-Reactive Protein No results for input(s): CRP in the last 72 hours.  Micro Results: Recent Results (from the past 720 hour(s))  Blood Culture (routine x 2)     Status: None (Preliminary result)   Collection Time: 02/25/15  8:45 PM  Result Value Ref Range Status   Specimen Description BLOOD RIGHT FOREARM  Final   Special Requests BOTTLES DRAWN AEROBIC AND  ANAEROBIC 5C  Final   Culture  Setup Time   Final    GRAM POSITIVE COCCI IN PAIRS CRITICAL RESULT CALLED TO, READ BACK BY AND VERIFIED WITH: K BREINICH 02/26/15 @ 1209 M VESTAL IN BOTH AEROBIC AND ANAEROBIC BOTTLES    Culture   Final    GRAM POSITIVE COCCI Performed at [facility]    Report Status PENDING  Incomplete  Blood Culture (routine x 2)     Status: None (Preliminary result)   Collection Time: 02/25/15  9:15 PM  Result Value Ref Range Status   Specimen Description BLOOD LEFT FOREARM  Final   Special Requests BOTTLES DRAWN AEROBIC AND ANAEROBIC 5CC  Final   Culture  Setup Time   Final    GRAM POSITIVE COCCI IN PAIRS IN BOTH AEROBIC AND ANAEROBIC BOTTLES CRITICAL RESULT CALLED TO, READ BACK BY AND VERIFIED WITH: K [facility] 02/26/15 @ 1209 M VESTAL    Culture   Final    GRAM POSITIVE COCCI Performed at [facility]    Report Status PENDING  Incomplete  Urine culture     Status: None   Collection Time: 02/25/15 10:59 PM  Result Value Ref Range Status   Specimen Description URINE, CLEAN CATCH  Final   Special Requests NONE  Final   Culture   Final    8,000 COLONIES/mL INSIGNIFICANT GROWTH Performed at [facility]    Report Status 02/27/2015 FINAL  Final    Studies/Results: Dg Chest 1 View  02/25/2015  CLINICAL DATA:  81 year old male with back pain for 6 hours. EXAM: CHEST 1 VIEW COMPARISON:  Chest radiograph 01/20/2013. Included lung bases from CT abdomen/pelvis performed concurrently. FINDINGS: Single lead left-sided pacemaker remains in place. Patient is post median sternotomy with 2 prosthetic valves. Multi chamber cardiomegaly, progressed from prior exam. There is tortuosity and atherosclerosis of the thoracic aorta. Vascular congestion with possible mild perihilar edema. Calcified granuloma in the right upper lung. Small pleural effusions on CT are not seen radiographically. No confluent airspace disease. No pneumothorax. IMPRESSION:  Cardiomegaly with vascular congestion and questionable perihilar edema. Recommend correlation for CHF. Electronically Signed   By: Rubye Oaks M.D.   On: 02/25/2015 22:30   Dg Thoracic Spine 2 View  02/25/2015  CLINICAL DATA:  Lower back pain since 4:30 this afternoon. History of chronic back pain. No injury noted. EXAM: THORACIC SPINE 2 VIEWS COMPARISON:  Chest x-ray dated 01/20/2013. FINDINGS: Portions of the thoracic spine are incompletely seen due to overlying osseous and soft tissue structures. Overall osseous alignment appears normal and grossly stable compared to a  previous chest x-ray of 01/20/2013. No osseous fracture or dislocation seen. No acute - appearing cortical irregularity or osseous lesion. Paravertebral soft tissues are unremarkable for acute process. IMPRESSION: No acute findings. Electronically Signed   By: Bary Richard M.D.   On: 02/25/2015 22:29   Dg Lumbar Spine Complete  02/25/2015  CLINICAL DATA:  81 year old male with lumbosacral back pain for 6 hours. EXAM: LUMBAR SPINE - COMPLETE 4+ VIEW COMPARISON:  Reformats from CT abdomen/ pelvis 09/02/2011. FINDINGS: Kyphoplasty within L4 compression deformity. Remaining vertebral body heights are maintained. Mild levoscoliosis centered at L2-L3 with associated degenerative disc disease. Additional disc space narrowing at L1-L2. The bones are under mineralized. Aorto bi-iliac stent in place. IMPRESSION: 1. Kyphoplasty within L4 compression deformity. 2. Scoliosis and degenerative change.  No acute bony abnormality. Electronically Signed   By: Rubye Oaks M.D.   On: 02/25/2015 22:26   Ct Abdomen Pelvis W Contrast  02/25/2015  CLINICAL DATA:  Severe low back pain with any movement. History of chronic back pain. Abdominal pain and tenderness. EXAM: CT ABDOMEN AND PELVIS WITH CONTRAST TECHNIQUE: Multidetector CT imaging of the abdomen and pelvis was performed using the standard protocol following bolus administration of  intravenous contrast. CONTRAST:  70mL OMNIPAQUE IOHEXOL 300 MG/ML  SOLN COMPARISON:  09/02/2011 FINDINGS: Small bilateral pleural effusions. Multiple surgical clips at the EG junction. Postoperative changes in the mediastinum. Surgical absence of the gallbladder. Calcified granulomas scattered throughout the liver and spleen. No hepatosplenomegaly. The pancreas, adrenal glands, inferior vena cava, and retroperitoneal lymph nodes are unremarkable. Abdominal aortic aneurysm post aortoiliac stent grafts. Native sac diameter is 5.6 cm. This measurement is similar to the previous study. Kidneys are atrophic bilaterally. Renal nephrograms appear symmetrical. No hydronephrosis. Large cysts in both kidneys, largest is off of the lower pole of the left kidney and measures about 8.9 cm diameter. This cyst is increased in size since the previous study. The stomach, small bowel, and colon are not abnormally distended. The colon is diffusely stool-filled. No free air or free fluid in the abdomen. Surgical clips in the anterior abdominal wall. Pelvis: Prostate gland is not enlarged. Prostate defect may indicate previous TUR procedure. Bladder wall is not thickened. Appendix is surgically absent. No free or loculated pelvic fluid collections. No pelvic mass or lymphadenopathy. Vascular calcifications in the pelvis. Fat in the inguinal canals. Degenerative changes in the lumbar spine. L4 compression deformity post kyphoplasty. No destructive bone lesions. IMPRESSION: No significant acute process demonstrated in the abdomen or pelvis. There are small bilateral pleural effusions. Postoperative changes demonstrated throughout the chest, abdomen, and pelvis. Abdominal aortic aneurysm post stent graft. Native sac diameter is 5.6 cm without change since prior study. Multiple renal cysts with enlarging large cyst on the left kidney. Electronically Signed   By: Burman Nieves M.D.   On: 02/25/2015 22:42       Assessment/Plan:  INTERVAL HISTORY:  02/27/15: TEE WITH SMALL VEGETATIONS ON both the mitral valve leaflet and the bioprosthetic aortic valve. BUT NO VEGETATION ON PM  --NOTE I MISREAD THE READ BY DR. Duke Salvia AND MY NOTE WAS INITIALLY NOT ACCURATE RE THIS PT   Principal Problem:   Intractable back pain Active Problems:   Anemia   Chronic a-fib on Coumadin   CAD (coronary artery disease) s/p CABG   HTN (hypertension)   Chronic diastolic heart failure (HCC)   Febrile illness   Lumbar pain   SIRS (systemic inflammatory response syndrome) (HCC)   Pyrexia    Ethan Most  E Malen Lawrence is a 81 y.o. 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  However removal will be dangerous since he has complete AV block he would need insertion of a temporary PM if his permanent PM is removed  We will likely instead go with a 6-8 week course of IV antibiotics followed potentially be oral antibiotics  #2 Severe lower back pain: Will try to get a CT with contrast of the lumbar spine if renal fxn permits this  Regardless we can give him a course for diskitis vertebral osteomyelitis with 6-8 weeks. He has NO neuro deficits at present      LOS: 1 day   Ethan Lawrence 02/27/2015, 6:49 PM

## 2015-02-27 NOTE — H&P (View-Only) (Signed)
   Please have general surgery to see while in hospital about right inguinal hernia. Very tender and possible source of bacteremia.

## 2015-02-27 NOTE — CV Procedure (Signed)
Brief TEE Note  There appears to be a small vegetation on both the mitral valve leaflet and the bioprosthetic aortic valve.  Moderate mitral regurgitation.  Mild TR.  No vegetation or thrombus on the RV lead.  For additional details see the official report.   Bracen Schum C. Duke Salviaandolph, MD, [facility]  02/27/2015  5:16 PM

## 2015-02-27 NOTE — Progress Notes (Signed)
Echocardiogram Echocardiogram Transesophageal has been performed.  Ethan Lawrence, Ethan Lawrence 02/27/2015, 5:38 PM

## 2015-02-27 NOTE — Progress Notes (Signed)
Pharmacy Antibiotic Follow-up Note  Ethan Lawrence is a 81 y.o. year-old male admitted on 02/25/2015.  The patient is currently on day 2 of vanc/zosyn for gram + bacteremia/concern for infected pacemaker hardware/diskitis vertebral osteomyelitis. Pharmacy consulted to change zosyn to unasyn and continue vancomycin.  Assessment/Plan: - dc zosyn - Unasyn 3 gm IV q12h - continue vancomycin 1 gm IV q24 - f/u ID/sensitivity of GPC in blood   Temp (24hrs), Avg:98.5 F (36.9 C), Min:97.7 F (36.5 C), Max:99.2 F (37.3 C)   Recent Labs Lab 02/21/15 1615 02/23/15 1633 02/25/15 2045 02/27/15 0750 02/27/15 1049  WBC 4.2 5.1 5.4 7.0 7.8    Recent Labs Lab 02/21/15 1615 02/25/15 2045 02/27/15 0750  CREATININE 1.76* 1.75* 1.73*   Estimated Creatinine Clearance: 29.3 mL/min (by C-G formula based on Cr of 1.73).    Allergies  Allergen Reactions  . Tape Other (See Comments)    Pulls skin off    Antimicrobials this admission:  12/18 Vanc>> 12/18 Zosyn>>12/19 Unasyn 12/19>>   Levels/dose changes this admission: none  Microbiology results:  12/17 blood x2 GPC/pairs 2/2 12/17 urine>pending   Thank you for allowing pharmacy to be a part of this patient's care. Herby AbrahamMichelle T. Sayla Golonka, Pharm.D. [phone] 02/27/2015 7:33 PM

## 2015-02-27 NOTE — Interval H&P Note (Signed)
History and Physical Interval Note:  02/27/2015 4:19 PM  [name]  has presented today for surgery, with the diagnosis of Endocarditis  The various methods of treatment have been discussed with the patient and family. After consideration of risks, benefits and other options for treatment, the patient has consented to  Procedure(s): TRANSESOPHAGEAL ECHOCARDIOGRAM (TEE) (N/A) as a surgical intervention .  The patient's history has been reviewed, patient examined, no change in status, stable for surgery.  I have reviewed the patient's chart and labs.  Questions were answered to the patient's satisfaction.    Farren Nelles C. Duke Salviaandolph, MD, [facility]  02/27/2015  4:19 PM

## 2015-02-27 NOTE — Progress Notes (Signed)
   Please have general surgery to see while in hospital about right inguinal hernia. Very tender and possible source of bacteremia. 

## 2015-02-27 NOTE — Progress Notes (Signed)
PROGRESS NOTE  Ethan Lawrence:[id number] DOB: 1922-12-22 DOA: 02/25/2015 PCP: PROVIDER NOT IN SYSTEM  HPI: 81 yo very functional male history of CAD, CHF,Chronic Atrial Fib on Coumadin Rx, AV Block S/P Pacemaker, AS, AAA S/P Repair, HTN, admitted with back pain progressive over the past 2-3 days and febrile illness for a week. He has been running a fever nightly for the past week, found to have fever of 103 on admission  Subjective / 24 H Interval events - doing OK this morning, back pain still significant however he appreciates mild improvement  Assessment/Plan: Principal Problem:   Intractable back pain Active Problems:   Anemia   Chronic a-fib on Coumadin   CAD (coronary artery disease) s/p CABG   HTN (hypertension)   Chronic diastolic heart failure (HCC)   Febrile illness   Lumbar pain   SIRS (systemic inflammatory response syndrome) (HCC)   Pyrexia  Sepsis due to gram positive bacteremia - speciation pending, surveillance cultures ordered today - unclear source, discussed with Dr. Katrinka Blazing from cardiology, 2D echo looked good few days ago - TEE pending - I consulted general surgery given possible right inguinal hernia and tenderness in that area - discussed with EP today, his pacemaker is NOT compatible with MRI  Inguinal hernia - surgery to see  New onset severe back pain - ? Infectious etiology - cannot obtain MRI due to pacemaker  Anemia - chronic, stable  Chronic A fib - continue Coumadin  CAD s/p CABG - stable, no chest pain  AV block s/p pacemaker - discussed with Dr. Katrinka Blazing and EP  HTN - continue home medications  Acute on chronic diastolic heart failure - some vascular congestion on CXR, on room air comfortable in the room , hold Lasix for now given that he received IV contrast in the ED  CKD stage III - Cr close to baseline, received IV contrast, closely monitor - Cr stable today   Thrombocytopenia - chronic, stable  Diet: Diet  NPO time specified Fluids: none  DVT Prophylaxis: Coumadin  Code Status: Full Code Family Communication: d/w daughter bedside  Disposition Plan: remain inpatient  Barriers to discharge: bacteremia  Consultants:  ID  General surgery   Cardiology (Dr. Katrinka Blazing - phone)  Procedures:  None    Antibiotics Vancomycin 12/18 >> Zosyn 12/18 >>   Studies  Dg Chest 1 View  02/25/2015  CLINICAL DATA:  81 year old male with back pain for 6 hours. EXAM: CHEST 1 VIEW COMPARISON:  Chest radiograph 01/20/2013. Included lung bases from CT abdomen/pelvis performed concurrently. FINDINGS: Single lead left-sided pacemaker remains in place. Patient is post median sternotomy with 2 prosthetic valves. Multi chamber cardiomegaly, progressed from prior exam. There is tortuosity and atherosclerosis of the thoracic aorta. Vascular congestion with possible mild perihilar edema. Calcified granuloma in the right upper lung. Small pleural effusions on CT are not seen radiographically. No confluent airspace disease. No pneumothorax. IMPRESSION: Cardiomegaly with vascular congestion and questionable perihilar edema. Recommend correlation for CHF. Electronically Signed   By: Rubye Oaks M.D.   On: 02/25/2015 22:30   Dg Thoracic Spine 2 View  02/25/2015  CLINICAL DATA:  Lower back pain since 4:30 this afternoon. History of chronic back pain. No injury noted. EXAM: THORACIC SPINE 2 VIEWS COMPARISON:  Chest x-ray dated 01/20/2013. FINDINGS: Portions of the thoracic spine are incompletely seen due to overlying osseous and soft tissue structures. Overall osseous alignment appears normal and grossly stable compared to a previous chest x-ray of 01/20/2013. No osseous  fracture or dislocation seen. No acute - appearing cortical irregularity or osseous lesion. Paravertebral soft tissues are unremarkable for acute process. IMPRESSION: No acute findings. Electronically Signed   By: Bary Richard M.D.   On: 02/25/2015 22:29    Dg Lumbar Spine Complete  02/25/2015  CLINICAL DATA:  81 year old male with lumbosacral back pain for 6 hours. EXAM: LUMBAR SPINE - COMPLETE 4+ VIEW COMPARISON:  Reformats from CT abdomen/ pelvis 09/02/2011. FINDINGS: Kyphoplasty within L4 compression deformity. Remaining vertebral body heights are maintained. Mild levoscoliosis centered at L2-L3 with associated degenerative disc disease. Additional disc space narrowing at L1-L2. The bones are under mineralized. Aorto bi-iliac stent in place. IMPRESSION: 1. Kyphoplasty within L4 compression deformity. 2. Scoliosis and degenerative change.  No acute bony abnormality. Electronically Signed   By: Rubye Oaks M.D.   On: 02/25/2015 22:26   Ct Abdomen Pelvis W Contrast  02/25/2015  CLINICAL DATA:  Severe low back pain with any movement. History of chronic back pain. Abdominal pain and tenderness. EXAM: CT ABDOMEN AND PELVIS WITH CONTRAST TECHNIQUE: Multidetector CT imaging of the abdomen and pelvis was performed using the standard protocol following bolus administration of intravenous contrast. CONTRAST:  70mL OMNIPAQUE IOHEXOL 300 MG/ML  SOLN COMPARISON:  09/02/2011 FINDINGS: Small bilateral pleural effusions. Multiple surgical clips at the EG junction. Postoperative changes in the mediastinum. Surgical absence of the gallbladder. Calcified granulomas scattered throughout the liver and spleen. No hepatosplenomegaly. The pancreas, adrenal glands, inferior vena cava, and retroperitoneal lymph nodes are unremarkable. Abdominal aortic aneurysm post aortoiliac stent grafts. Native sac diameter is 5.6 cm. This measurement is similar to the previous study. Kidneys are atrophic bilaterally. Renal nephrograms appear symmetrical. No hydronephrosis. Large cysts in both kidneys, largest is off of the lower pole of the left kidney and measures about 8.9 cm diameter. This cyst is increased in size since the previous study. The stomach, small bowel, and colon are not  abnormally distended. The colon is diffusely stool-filled. No free air or free fluid in the abdomen. Surgical clips in the anterior abdominal wall. Pelvis: Prostate gland is not enlarged. Prostate defect may indicate previous TUR procedure. Bladder wall is not thickened. Appendix is surgically absent. No free or loculated pelvic fluid collections. No pelvic mass or lymphadenopathy. Vascular calcifications in the pelvis. Fat in the inguinal canals. Degenerative changes in the lumbar spine. L4 compression deformity post kyphoplasty. No destructive bone lesions. IMPRESSION: No significant acute process demonstrated in the abdomen or pelvis. There are small bilateral pleural effusions. Postoperative changes demonstrated throughout the chest, abdomen, and pelvis. Abdominal aortic aneurysm post stent graft. Native sac diameter is 5.6 cm without change since prior study. Multiple renal cysts with enlarging large cyst on the left kidney. Electronically Signed   By: Burman Nieves M.D.   On: 02/25/2015 22:42    Objective  Filed Vitals:   02/27/15 0210 02/27/15 0547 02/27/15 0959 02/27/15 1259  BP: 139/85 117/68 107/60 125/65  Pulse: 86 74 86 82  Temp: 97.7 F (36.5 C) 98.3 F (36.8 C) 98.7 F (37.1 C) 98.8 F (37.1 C)  TempSrc: Oral Oral Oral Oral  Resp: 17 17 16 20   Height:      Weight:      SpO2: 100% 98% 94% 98%    Intake/Output Summary (Last 24 hours) at 02/27/15 1322 Last data filed at 02/27/15 0654  Gross per 24 hour  Intake      0 ml  Output    825 ml  Net   -  825 ml   Filed Weights   02/26/15 0203 02/26/15 0640  Weight: 81.194 kg (179 lb) 81.829 kg (180 lb 6.4 oz)   Exam:  GENERAL: NAD  HEENT: no scleral icterus, PERRL  NECK: supple, no LAD  LUNGS: CTA biL, no wheezing  HEART: RRR without MRG  ABDOMEN: soft, tender right inguinal area  EXTREMITIES: no clubbing / cyanosis  NEUROLOGIC: non focal   Data Reviewed: Basic Metabolic Panel:  Recent Labs Lab  02/21/15 1615 02/25/15 2045 02/27/15 0750  NA 140 139 138  K 4.7 4.3 4.5  CL 107 108 110  CO2 23 23 20*  GLUCOSE 120* 118* 101*  BUN 33* 32* 26*  CREATININE 1.76* 1.75* 1.73*  CALCIUM 8.1* 8.1* 8.9   Liver Function Tests:  Recent Labs Lab 02/25/15 2045  AST 17  ALT 10*  ALKPHOS 86  BILITOT 0.8  PROT 6.6  ALBUMIN 3.4*   CBC:  Recent Labs Lab 02/21/15 1615 02/23/15 1633 02/25/15 2045 02/27/15 0750 02/27/15 1049  WBC 4.2 5.1 5.4 7.0 7.8  NEUTROABS 2.9  --  4.3  --   --   HGB 9.9* 10.4* 9.3* 9.2* 9.1*  HCT 31.5* 31.7* 29.3* 30.0* 28.9*  MCV 92.1 88.7 91.8 93.8 92.0  PLT 99*  --  102* 122* 120*    Recent Results (from the past 240 hour(s))  Blood Culture (routine x 2)     Status: None (Preliminary result)   Collection Time: 02/25/15  8:45 PM  Result Value Ref Range Status   Specimen Description BLOOD RIGHT FOREARM  Final   Special Requests BOTTLES DRAWN AEROBIC AND ANAEROBIC 5C  Final   Culture  Setup Time   Final    GRAM POSITIVE COCCI IN PAIRS CRITICAL RESULT CALLED TO, READ BACK BY AND VERIFIED WITH: K BREINICH 02/26/15 @ 1209 M VESTAL IN BOTH AEROBIC AND ANAEROBIC BOTTLES    Culture   Final    GRAM POSITIVE COCCI Performed at [facility]    Report Status PENDING  Incomplete  Blood Culture (routine x 2)     Status: None (Preliminary result)   Collection Time: 02/25/15  9:15 PM  Result Value Ref Range Status   Specimen Description BLOOD LEFT FOREARM  Final   Special Requests BOTTLES DRAWN AEROBIC AND ANAEROBIC 5CC  Final   Culture  Setup Time   Final    GRAM POSITIVE COCCI IN PAIRS IN BOTH AEROBIC AND ANAEROBIC BOTTLES CRITICAL RESULT CALLED TO, READ BACK BY AND VERIFIED WITH: K [facility] 02/26/15 @ 1209 M VESTAL    Culture   Final    GRAM POSITIVE COCCI Performed at [facility] [place]    Report Status PENDING  Incomplete  Urine culture     Status: None   Collection Time: 02/25/15 10:59 PM  Result Value Ref Range Status   Specimen  Description URINE, CLEAN CATCH  Final   Special Requests NONE  Final   Culture   Final    8,000 COLONIES/mL INSIGNIFICANT GROWTH [facility]formed [facility]    Report Status 02/27/2015 FINAL  Final     Scheduled Meds: . clotrimazole   Topical BID  . docusate sodium  100 mg Oral BID  . folic acid  1 mg Oral Daily  . gabapentin  300 mg Oral BID  . multivitamin-lutein  1 capsule Oral BID  . pantoprazole  40 mg Oral Daily  . piperacillin-tazobactam (ZOSYN)  IV  3.375 g Intravenous 3 times per day  . predniSONE  5 mg Oral Q breakfast  . rOPINIRole  1 mg Oral Daily  . simvastatin  20 mg Oral QHS  . sodium chloride  3 mL Intravenous Q12H  . vancomycin  1,000 mg Intravenous Q24H  . warfarin  6 mg Oral ONCE-1800  . Warfarin - Pharmacist Dosing Inpatient   Does not apply q1800   Continuous Infusions:   Time spent coordinating care: 35 minutes, greater than 50% of this time was spent in counseling, explanation of diagnosis, planning of further management, discussing with cardiology, EP   Pamella Pert, MD [facility] Pager [phone]. If 7 PM - 7 AM, please contact night-coverage at [email or web address], password [facility] 02/27/2015, 1:22 PM  LOS: 1 day

## 2015-02-27 NOTE — Progress Notes (Signed)
ANTICOAGULATION CONSULT NOTE - Follow Up Consult  Pharmacy Consult for coumadin Indication: atrial fibrillation  Allergies  Allergen Reactions  . Tape Other (See Comments)    Pulls skin off    Patient Measurements: Height: 5\' 10"  (177.8 cm) Weight: 180 lb 6.4 oz (81.829 kg) IBW/kg (Calculated) : 73   Vital Signs: Temp: 98.3 F (36.8 C) (12/19 0547) Temp Source: Oral (12/19 0547) BP: 117/68 mmHg (12/19 0547) Pulse Rate: 74 (12/19 0547)  Labs:  Recent Labs  02/25/15 2045 02/26/15 0713 02/27/15 0750  HGB 9.3*  --  9.2*  HCT 29.3*  --  30.0*  PLT 102*  --  122*  APTT  --  60*  --   LABPROT  --  25.9* 23.6*  INR  --  2.40* 2.12*  CREATININE 1.75*  --  1.73*    Estimated Creatinine Clearance: 29.3 mL/min (by C-G formula based on Cr of 1.73).    Assessment: 81 y.o. F presents with severe back pain. She is on coumadin PTA for afib and pharmacy consulted to dose -INR= 2.12 and at goal  Home dose: 6mg  daily except 3 mg on Tues and Sat. Missed dose on 12/17.   Goal of Therapy:  INR 2-3 Monitor platelets by anticoagulation protocol: Yes   Plan:  -Coumadin 6mg  po today -Daily INR for now -Q72h CBC   Inioluwa Baris C. Marvis MoellerMiles, PharmD Pharmacy Resident  Pager: [phone] 02/27/2015 9:57 AM

## 2015-02-28 ENCOUNTER — Inpatient Hospital Stay (HOSPITAL_COMMUNITY): Payer: Medicare PPO

## 2015-02-28 ENCOUNTER — Encounter (HOSPITAL_COMMUNITY): Payer: Self-pay | Admitting: Cardiovascular Disease

## 2015-02-28 DIAGNOSIS — B954 Other streptococcus as the cause of diseases classified elsewhere: Secondary | ICD-10-CM

## 2015-02-28 DIAGNOSIS — A491 Streptococcal infection, unspecified site: Secondary | ICD-10-CM

## 2015-02-28 DIAGNOSIS — T827XXD Infection and inflammatory reaction due to other cardiac and vascular devices, implants and grafts, subsequent encounter: Secondary | ICD-10-CM

## 2015-02-28 DIAGNOSIS — M4646 Discitis, unspecified, lumbar region: Secondary | ICD-10-CM

## 2015-02-28 DIAGNOSIS — M549 Dorsalgia, unspecified: Secondary | ICD-10-CM

## 2015-02-28 DIAGNOSIS — M4640 Discitis, unspecified, site unspecified: Secondary | ICD-10-CM | POA: Insufficient documentation

## 2015-02-28 DIAGNOSIS — T826XXA Infection and inflammatory reaction due to cardiac valve prosthesis, initial encounter: Secondary | ICD-10-CM

## 2015-02-28 DIAGNOSIS — I38 Endocarditis, valve unspecified: Secondary | ICD-10-CM | POA: Insufficient documentation

## 2015-02-28 DIAGNOSIS — I339 Acute and subacute endocarditis, unspecified: Secondary | ICD-10-CM

## 2015-02-28 DIAGNOSIS — I25111 Atherosclerotic heart disease of native coronary artery with angina pectoris with documented spasm: Secondary | ICD-10-CM

## 2015-02-28 HISTORY — DX: Streptococcal infection, unspecified site: A49.1

## 2015-02-28 HISTORY — DX: Acute and subacute endocarditis, unspecified: I33.9

## 2015-02-28 LAB — CULTURE, BLOOD (ROUTINE X 2)

## 2015-02-28 LAB — PROTIME-INR
INR: 2.75 — ABNORMAL HIGH (ref 0.00–1.49)
Prothrombin Time: 28.7 seconds — ABNORMAL HIGH (ref 11.6–15.2)

## 2015-02-28 LAB — VANCOMYCIN, RANDOM: VANCOMYCIN RM: 24 ug/mL

## 2015-02-28 MED ORDER — DICLOFENAC EPOLAMINE 1.3 % TD PTCH
1.0000 | MEDICATED_PATCH | Freq: Two times a day (BID) | TRANSDERMAL | Status: DC
  Administered 2015-02-28 – 2015-03-08 (×16): 1 via TRANSDERMAL
  Filled 2015-02-28 (×21): qty 1

## 2015-02-28 MED ORDER — DEXTROSE 5 % IV SOLN
2.0000 g | INTRAVENOUS | Status: DC
  Administered 2015-02-28 – 2015-03-08 (×9): 2 g via INTRAVENOUS
  Filled 2015-02-28 (×9): qty 2

## 2015-02-28 NOTE — Evaluation (Signed)
Physical Therapy Evaluation Patient Details Name: Ethan Lawrence MRN: [id number] DOB: Dec 28, 1922 Today's Date: 02/28/2015   History of Present Illness  Ethan Lawrence is a 81 y.o. male with a history of CAD, CHF,Chronic Atrial Fib on Coumadin Rx, AV Block S/P Pacemaker, AS, AAA S/P Repair, HTN who presents to the ED with complaints of sudden onset of Intractable 10/10 Spasmodic low back pain for several days.  Clinical Impression  Pt mobility extremely limited by severe low back pain. 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 At this time pt requires assistx2 for all mobility and is unable to tolerate sitting or attempt standing. Pt does have family assist but they can not provide maxAx2 level of care. Pt may need SNF if can not reach minA level. Pt's daughter reports "he has [facility]." Acute PT to follow and progress as able.    Follow Up Recommendations Supervision/Assistance - 24 hour;Home health PT (may need snf if pt can't progress to a minAx1 level.)    Equipment Recommendations  Rolling walker with 5" wheels    Recommendations for Other Services       Precautions / Restrictions Precautions Precautions: Fall Precaution Comments: severe back pain Restrictions Weight Bearing Restrictions: No      Mobility  Bed Mobility Overal bed mobility: Needs Assistance Bed Mobility: Rolling;Sidelying to Sit;Sit to Sidelying Rolling: Min assist Sidelying to sit: Mod assist;+2 for physical assistance     Sit to sidelying: Mod assist;+2 for physical assistance General bed mobility comments: modAx2 required due to back pain with any movement. pt attempted and tried to do as much as possible  Transfers                 General transfer comment: uanble to attempt standing due to severe pain with sitting  Ambulation/Gait                Stairs            Wheelchair Mobility    Modified  Rankin (Stroke Patients Only)       Balance Overall balance assessment: Needs assistance Sitting-balance support: Feet supported;Bilateral upper extremity supported Sitting balance-Leahy Scale: Poor Sitting balance - Comments: unable to tolerate sitting due to sever back pain, pt with increased trunk flexion and writhing in pain                                     Pertinent Vitals/Pain Pain Assessment: 0-10 Pain Score: 10-Worst pain ever Pain Location: low back Pain Descriptors / Indicators: Sharp Pain Intervention(s): Limited activity within patient's tolerance    Home Living Family/patient expects to be discharged to:: Unsure                 Additional Comments: pt lives alone but someone typically there with him. he is from [facility] and daughter lives here. pt and dtrs house each have 3 steps to enter with hand rails.     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, walking without AD or difficulty and indep with all ADLs     Hand Dominance   Dominant Hand: Right    Extremity/Trunk Assessment   Upper Extremity Assessment: Overall WFL for tasks assessed  Lower Extremity Assessment: Generalized weakness (unable to complete MMT due to back pain)      Cervical / Trunk Assessment: Kyphotic  Communication   Communication: No difficulties  Cognition Arousal/Alertness: Awake/alert Behavior During Therapy: WFL for tasks assessed/performed Overall Cognitive Status: Within Functional Limits for tasks assessed                      General Comments      Exercises        Assessment/Plan    PT Assessment Patient needs continued PT services  PT Diagnosis Difficulty walking;Acute pain   PT Problem List Decreased strength;Decreased range of motion;Decreased activity tolerance;Decreased balance;Decreased mobility  PT Treatment Interventions DME instruction;Gait  training;Stair training;Functional mobility training;Therapeutic activities;Therapeutic exercise   PT Goals (Current goals can be found in the Care Plan section) Acute Rehab PT Goals Patient Stated Goal: stop the pain PT Goal Formulation: With patient Time For Goal Achievement: 03/07/15 Potential to Achieve Goals: Good    Frequency Min 3X/week   Barriers to discharge        Co-evaluation               End of Session   Activity Tolerance: Patient limited by pain Patient left: in bed;with call bell/phone within reach;with family/visitor present Nurse Communication: Mobility status         Time: [id number] PT Time Calculation (min) (ACUTE ONLY): 18 min   Charges:   PT Evaluation $Initial PT Evaluation Tier I: 1 Procedure     PT G CodesMarcene Brawn:        Ethan Lawrence 02/28/2015, 4:41 PM  Lewis ShockAshly Landon Truax, PT, DPT Pager #: [phone] Office #: [phone]

## 2015-02-28 NOTE — Progress Notes (Signed)
[facility]    Subjective: Severe back pain Becoming more hard of hearing   Antibiotics:  Anti-infectives    Start     Dose/Rate Route Frequency Ordered Stop   02/28/15 1200  cefTRIAXone (ROCEPHIN) 2 g in dextrose 5 % 50 mL IVPB     2 g 100 mL/hr over 30 Minutes Intravenous Every 24 hours 02/28/15 1055     02/27/15 2000  Ampicillin-Sulbactam (UNASYN) 3 g in sodium chloride 0.9 % 100 mL IVPB  Status:  Discontinued     3 g 100 mL/hr over 60 Minutes Intravenous Every 12 hours 02/27/15 1931 02/28/15 1054   02/27/15 0600  vancomycin (VANCOCIN) IVPB 1000 mg/200 mL premix  Status:  Discontinued     1,000 mg 200 mL/hr over 60 Minutes Intravenous Every 24 hours 02/26/15 0706 02/28/15 1054   02/26/15 1500  piperacillin-tazobactam (ZOSYN) IVPB 3.375 g  Status:  Discontinued     3.375 g 12.5 mL/hr over 240 Minutes Intravenous 3 times per day 02/26/15 0706 02/27/15 1859   02/26/15 0730  piperacillin-tazobactam (ZOSYN) IVPB 3.375 g     3.375 g 100 mL/hr over 30 Minutes Intravenous STAT 02/26/15 0706 02/26/15 1105   02/26/15 0530  vancomycin (VANCOCIN) 1,250 mg in sodium chloride 0.9 % 250 mL IVPB     1,250 mg 166.7 mL/hr over 90 Minutes Intravenous  Once 02/26/15 0517 02/26/15 0712   02/25/15 2045  cefTRIAXone (ROCEPHIN) 2 g in dextrose 5 % 50 mL IVPB     2 g 100 mL/hr over 30 Minutes Intravenous  Once 02/25/15 2040 02/25/15 2244      Medications: Scheduled Meds: . cefTRIAXone (ROCEPHIN)  IV  2 g Intravenous Q24H  . clotrimazole   Topical BID  . diclofenac  1 patch Transdermal BID  . docusate sodium  100 mg Oral BID  . folic acid  1 mg Oral Daily  . gabapentin  300 mg Oral BID  . multivitamin-lutein  1 capsule Oral BID  . pantoprazole  40 mg Oral Daily  . predniSONE  5 mg Oral Q breakfast  . rOPINIRole  1 mg Oral Daily  . simvastatin  20 mg Oral QHS  . sodium chloride  3 mL Intravenous Q12H  . warfarin  6 mg Oral ONCE-1800  . Warfarin -  Pharmacist Dosing Inpatient   Does not apply q1800   Continuous Infusions:  PRN Meds:.sodium chloride, acetaminophen **OR** acetaminophen, alum & mag hydroxide-simeth, HYDROmorphone (DILAUDID) injection, HYDROmorphone (DILAUDID) injection, methocarbamol, ondansetron **OR** ondansetron (ZOFRAN) IV, oxyCODONE, oxyCODONE-acetaminophen, sodium chloride    Objective: Weight change:   Intake/Output Summary (Last 24 hours) at 02/28/15 1519 Last data filed at 02/28/15 1322  Gross per 24 hour  Intake      0 ml  Output    900 ml  Net   -900 ml   Blood pressure 119/75, pulse 94, temperature 98.9 F (37.2 C), temperature source Oral, resp. rate 20, height 5\' 10"  (1.778 m), weight 180 lb 6.4 oz (81.829 kg), SpO2 93 %. Temp:  [98.5 F (36.9 C)-99.2 F (37.3 C)] 98.9 F (37.2 C) (12/20 0942) Pulse Rate:  [25-100] 94 (12/20 0942) Resp:  [15-28] 20 (12/20 0942) BP: (113-175)/(57-119) 119/75 mmHg (12/20 0942) SpO2:  [82 %-100 %] 93 % (12/20 0942)  Physical Exam: General: Alert and awake, not in any acute distress. HEENT: anicteric sclera, EOMI OP clear, he has teeth on lower mandible  CVS irr irregular rate, normal  r,  2/VI murmur at RUSB Chest: clear to auscultation bilaterally, no wheezing, rales or rhonchi Abdomen: soft nontender, nondistended, normal bowel sounds, Extremities 2+ edema Neuro: nonfocal  CBC: CBC Latest Ref Rng 02/27/2015 02/27/2015 02/25/2015  WBC 4.0 - 10.5 K/uL 7.8 7.0 5.4  Hemoglobin 13.0 - 17.0 g/dL 4.0(J) 8.1(X) 9.1(Y)  Hematocrit 39.0 - 52.0 % 28.9(L) 30.0(L) 29.3(L)  Platelets 150 - 400 K/uL 120(L) 122(L) 102(L)       BMET  Recent Labs  02/25/15 2045 02/27/15 0750  NA 139 138  K 4.3 4.5  CL 108 110  CO2 23 20*  GLUCOSE 118* 101*  BUN 32* 26*  CREATININE 1.75* 1.73*  CALCIUM 8.1* 8.9     Liver Panel   Recent Labs  02/25/15 2045  PROT 6.6  ALBUMIN 3.4*  AST 17  ALT 10*  ALKPHOS 86  BILITOT 0.8       Sedimentation Rate  Recent  Labs  02/25/15 2045  ESRSEDRATE 58*   C-Reactive Protein No results for input(s): CRP in the last 72 hours.  Micro Results: Recent Results (from the past 720 hour(s))  Blood Culture (routine x 2)     Status: None   Collection Time: 02/25/15  8:45 PM  Result Value Ref Range Status   Specimen Description BLOOD RIGHT FOREARM  Final   Special Requests BOTTLES DRAWN AEROBIC AND ANAEROBIC 5C  Final   Culture  Setup Time   Final    GRAM POSITIVE COCCI IN PAIRS CRITICAL RESULT CALLED TO, READ BACK BY AND VERIFIED WITH: K BREINICH 02/26/15 @ 1209 M VESTAL IN BOTH AEROBIC AND ANAEROBIC BOTTLES    Culture   Final    VIRIDANS STREPTOCOCCUS Performed at [facility]    Report Status 02/28/2015 FINAL  Final   Organism ID, Bacteria VIRIDANS STREPTOCOCCUS  Final      Susceptibility   Viridans streptococcus - MIC*    ERYTHROMYCIN <=0.12 SENSITIVE Sensitive     TETRACYCLINE >=16 RESISTANT Resistant     VANCOMYCIN 0.5 SENSITIVE Sensitive     CLINDAMYCIN <=0.25 SENSITIVE Sensitive     PENICILLIN <=0.06 SENSITIVE Sensitive     CEFTRIAXONE <=0.12 SENSITIVE Sensitive     LEVOFLOXACIN 2 SENSITIVE Sensitive     * VIRIDANS STREPTOCOCCUS  Blood Culture (routine x 2)     Status: None   Collection Time: 02/25/15  9:15 PM  Result Value Ref Range Status   Specimen Description BLOOD LEFT FOREARM  Final   Special Requests BOTTLES DRAWN AEROBIC AND ANAEROBIC 5CC  Final   Culture  Setup Time   Final    GRAM POSITIVE COCCI IN PAIRS IN BOTH AEROBIC AND ANAEROBIC BOTTLES CRITICAL RESULT CALLED TO, READ BACK BY AND VERIFIED WITH: K BREINICH 02/26/15 @ 1209 M VESTAL    Culture   Final    VIRIDANS STREPTOCOCCUS SUSCEPTIBILITIES PERFORMED ON PREVIOUS CULTURE WITHIN THE LAST 5 DAYS. Performed at [facility]    Report Status 02/28/2015 FINAL  Final  Urine culture     Status: None   Collection Time: 02/25/15 10:59 PM  Result Value Ref Range Status   Specimen Description URINE, CLEAN CATCH   Final   Special Requests NONE  Final   Culture   Final    8,000 COLONIES/mL INSIGNIFICANT GROWTH Performed at [facility]    Report Status 02/27/2015 FINAL  Final  Culture, blood (Routine X 2) w Reflex to ID Panel     Status: None (Preliminary result)   Collection  Time: 02/27/15  6:45 PM  Result Value Ref Range Status   Specimen Description BLOOD LEFT ARM  Final   Special Requests BOTTLES DRAWN AEROBIC AND ANAEROBIC 5CC  Final   Culture NO GROWTH < 24 HOURS  Final   Report Status PENDING  Incomplete  Culture, blood (Routine X 2) w Reflex to ID Panel     Status: None (Preliminary result)   Collection Time: 02/27/15  6:50 PM  Result Value Ref Range Status   Specimen Description BLOOD LEFT HAND  Final   Special Requests IN PEDIATRIC BOTTLE 1CC  Final   Culture NO GROWTH < 24 HOURS  Final   Report Status PENDING  Incomplete    Studies/Results: Dg Orthopantogram  02/28/2015  CLINICAL DATA:  Jaw pain EXAM: ORTHOPANTOGRAM/PANORAMIC COMPARISON:  None. FINDINGS: The patient is predominantly edentulous. On the left adjacent to the root of the second mandibular bicuspid there is a circular lucency identified suggestive of periapical abscess. IMPRESSION: Likely periapical abscess on the left in the mandible adjacent to the root of the second bicuspid. Electronically Signed   By: Alcide Clever M.D.   On: 02/28/2015 14:13      Assessment/Plan:  INTERVAL HISTORY:  02/27/15: TEE WITH SMALL VEGETATIONS ON both the mitral valve leaflet and the bioprosthetic aortic valve. BUT NO VEGETATION ON PM  --NOTE I MISREAD THE READ BY DR. Duke Salvia AND MY NOTE WAS INITIALLY NOT ACCURATE RE THIS PT  02/28/15: organims is growing viridans strep S to PCN   Principal Problem:   Intractable back pain Active Problems:   Anemia   Chronic a-fib on Coumadin   CAD (coronary artery disease) s/p CABG   HTN (hypertension)   Chronic diastolic heart failure (HCC)   Febrile illness   Lumbar pain   SIRS  (systemic inflammatory response syndrome) (HCC)   Pyrexia   Lumbar back pain   Gram-positive bacteremia   Pacemaker infection (HCC)   Streptococcus viridans infection   Acute endocarditis    Ethan Lawrence is a 81 y.o. 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  I dont think he would be candidate for CT surgery and no clear cut indication for CT surgery at this point  We could have TEE repeated per grand-daughter suggestion -   #2 Severe lower back pain: Will try to get a CT with contrast of the lumbar spine if renal fxn permits this  Regardless we can give him a course for diskitis vertebral osteomyelitis with 6-8 weeks. He has NO neuro deficits at present  #3 Hearing loss: could this be due to vancomycin--stopping abx  I spent greater than 40 minutes with the patient including greater than 50% of time in face to face counsel of the patient and daughter re his NVE and PVE and likely diskitis, hearing loss and in coordination of their care.      LOS: 2 days   Acey Lav 02/28/2015, 3:19 PM

## 2015-02-28 NOTE — Progress Notes (Signed)
ANTICOAGULATION CONSULT NOTE - Follow Up Consult  Pharmacy Consult for coumadin Indication: atrial fibrillation  Allergies  Allergen Reactions  . Tape Other (See Comments)    Pulls skin off    Patient Measurements: Height: 5\' 10"  (177.8 cm) Weight: 180 lb 6.4 oz (81.829 kg) IBW/kg (Calculated) : 73   Vital Signs: Temp: 98.9 F (37.2 C) (12/20 0942) Temp Source: Oral (12/20 0942) BP: 119/75 mmHg (12/20 0942) Pulse Rate: 94 (12/20 0942)  Labs:  Recent Labs  02/25/15 2045 02/26/15 0713 02/27/15 0750 02/27/15 1049 02/28/15 0743  HGB 9.3*  --  9.2* 9.1*  --   HCT 29.3*  --  30.0* 28.9*  --   PLT 102*  --  122* 120*  --   APTT  --  60*  --   --   --   LABPROT  --  25.9* 23.6*  --  28.7*  INR  --  2.40* 2.12*  --  2.75*  CREATININE 1.75*  --  1.73*  --   --     Estimated Creatinine Clearance: 29.3 mL/min (by C-G formula based on Cr of 1.73).    Assessment: 81 y.o. F presents with severe back pain. She is on coumadin PTA for afib and pharmacy consulted to dose  Home dose: 6mg  daily except 3 mg on Tues and Sat. Missed dose on 12/17.   Patient received 6mg  dose on 12/18 but missed dose on 12/19. INR jumped from 2.1 to 2.75 today. Will plan to hold tonight's dose as well. H/H stable, no bleeding noted   Goal of Therapy:  INR 2-3 Monitor platelets by anticoagulation protocol: Yes   Plan:  -Hold tonight's coumadin dose  -Daily INR for now -Q72h CBC   Ellinore Merced C. Marvis MoellerMiles, PharmD Pharmacy Resident  Pager: [phone] 02/28/2015 10:06 AM

## 2015-02-28 NOTE — Progress Notes (Addendum)
PROGRESS NOTE  Ethan Lawrence ZOX:[id number] DOB: 02-Sep-1922 DOA: 02/25/2015 PCP: PROVIDER NOT IN SYSTEM  HPI: 80 yo very functional male history of CAD, CHF,Chronic Atrial Fib on Coumadin Rx, AV Block S/P Pacemaker, AS, AAA S/P Repair, HTN, admitted with back pain progressive over the past 2-3 days and febrile illness for a week. He has been running a fever nightly for the past week, found to have fever of 103 on admission. [name] blood cultures speciated GPC and ID was consulted. He underwent a TEE which showed possible endocarditis. He has a very tender right sided inguinal hernia and surgery evaluated without need for acute interventions.   Subjective / 24 H Interval events - ongoing back pain today  Assessment/Plan: Principal Problem:   Intractable back pain Active Problems:   Anemia   Chronic a-fib on Coumadin   CAD (coronary artery disease) s/p CABG   HTN (hypertension)   Chronic diastolic heart failure (HCC)   Febrile illness   Lumbar pain   SIRS (systemic inflammatory response syndrome) (HCC)   Pyrexia   Lumbar back pain   Gram-positive bacteremia   Pacemaker infection (HCC)   Streptococcus viridans infection   Acute endocarditis  Sepsis due to gram positive bacteremia, speciated to Viridans strep - Source oral ? Obtain panorex - daughter tell me that patient had his upper dentures replaced about 3 weeks ago since old one were bothering him. No other major dental work, no cleaning. He still has his bottom teeth. No mouth pain or problems.  - antibiotics narrowed to ceftriaxone per sensitivities  Strep Viridans Endocarditis  - TEE on 12/20 showing a 8 mm x 4 mm mobile target on the atrial side of the mitral valve at the region of the P3 segment that is concerningfor endocarditis given the clinical scenario as well as a mobile target on the superior surface of the prosthetic aortic valve sewing ring. No vegetations were noted on the pacer lead.  AS s/p aVR  and s/p tricuspid ring annuloplasty - I did let Dr. Morton Peters know patient's current issues.  Inguinal hernia - I consulted general surgery given possible right inguinal hernia and tenderness in that area, defer any intervention for now   New onset severe back pain - ? Infectious etiology - discussed with EP on 12/20, his pacemaker is NOT compatible with MRI  Anemia - chronic, stable  Chronic A fib - continue Coumadin  CAD s/p CABG - stable, no chest pain  AV block s/p pacemaker  HTN - continue home medications  Acute on chronic diastolic heart failure - some vascular congestion on CXR, on room air comfortable in the room , hold Lasix for now given that he received IV contrast in the ED  CKD stage III - Cr close to baseline, received IV contrast, closely monitor - Cr stable   Thrombocytopenia - chronic, stable  Diet: Diet Heart Room service appropriate?: Yes; Fluid consistency:: Thin Fluids: none  DVT Prophylaxis: Coumadin  Code Status: Full Code Family Communication: d/w daughter bedside  Disposition Plan: remain inpatient  Barriers to discharge: bacteremia  Consultants:  ID  General surgery   Cardiology (Dr. Katrinka Blazing - phone)  Procedures:  TEE Impressions: - There is a 8 mm x 4 mm mobile target on the atrial side of themitral valve at the region of the P3 segment that is concerning for endocarditis given the clinical scenario. Cannot rule outruptured cord. There is no associated valve destruction and themitral regurgitation appears to be separate from  the suspectedlesion. There is also a mobile target on the superior surface of the prosthetic aortic valve sewing ring. The aortic valve gradients are moderately elevated, consistent with moderate steonis, asseen on transthoracic echo. The valve is well-seated and there isno rocking motion. There is no aortic reguritation.No vegetations were noted on the pacer lead.   Antibiotics Vancomycin 12/18  >> Zosyn 12/18 >>   Studies  No results found.  Objective  Filed Vitals:   02/27/15 2144 02/28/15 0212 02/28/15 0549 02/28/15 0942  BP: 128/57 126/84 152/99 119/75  Pulse: 94 83 94 94  Temp: 98.5 F (36.9 C) 98.7 F (37.1 C) 98.7 F (37.1 C) 98.9 F (37.2 C)  TempSrc: Oral Oral Oral Oral  Resp: 20 18 18 20   Height:      Weight:      SpO2: 96% 95% 96% 93%    Intake/Output Summary (Last 24 hours) at 02/28/15 1213 Last data filed at 02/28/15 0554  Gross per 24 hour  Intake      0 ml  Output    550 ml  Net   -550 ml   Filed Weights   02/26/15 0203 02/26/15 0640  Weight: 81.194 kg (179 lb) 81.829 kg (180 lb 6.4 oz)   Exam:  GENERAL: NAD  HEENT: no scleral icterus, PERRL  NECK: supple, no LAD  LUNGS: CTA biL, no wheezing  HEART: RRR without MRG  ABDOMEN: soft, tender right inguinal area  EXTREMITIES: no clubbing / cyanosis  NEUROLOGIC: non focal   Data Reviewed: Basic Metabolic Panel:  Recent Labs Lab 02/21/15 1615 02/25/15 2045 02/27/15 0750  NA 140 139 138  K 4.7 4.3 4.5  CL 107 108 110  CO2 23 23 20*  GLUCOSE 120* 118* 101*  BUN 33* 32* 26*  CREATININE 1.76* 1.75* 1.73*  CALCIUM 8.1* 8.1* 8.9   Liver Function Tests:  Recent Labs Lab 02/25/15 2045  AST 17  ALT 10*  ALKPHOS 86  BILITOT 0.8  PROT 6.6  ALBUMIN 3.4*   CBC:  Recent Labs Lab 02/21/15 1615 02/23/15 1633 02/25/15 2045 02/27/15 0750 02/27/15 1049  WBC 4.2 5.1 5.4 7.0 7.8  NEUTROABS 2.9  --  4.3  --   --   HGB 9.9* 10.4* 9.3* 9.2* 9.1*  HCT 31.5* 31.7* 29.3* 30.0* 28.9*  MCV 92.1 88.7 91.8 93.8 92.0  PLT 99*  --  102* 122* 120*    Recent Results (from the past 240 hour(s))  Blood Culture (routine x 2)     Status: None   Collection Time: 02/25/15  8:45 PM  Result Value Ref Range Status   Specimen Description BLOOD RIGHT FOREARM  Final   Special Requests BOTTLES DRAWN AEROBIC AND ANAEROBIC 5C  Final   Culture  Setup Time   Final    GRAM POSITIVE COCCI IN  PAIRS CRITICAL RESULT CALLED TO, READ BACK BY AND VERIFIED WITH: K BREINICH 02/26/15 @ 1209 M VESTAL IN BOTH AEROBIC AND ANAEROBIC BOTTLES    Culture   Final    VIRIDANS STREPTOCOCCUS [facility]    Report Status 02/28/2015 FINAL  Final   Organism ID, Bacteria VIRIDANS STREPTOCOCCUS  Final      Susceptibility   Viridans streptococcus - MIC*    ERYTHROMYCIN <=0.12 SENSITIVE Sensitive     TETRACYCLINE >=16 RESISTANT Resistant     VANCOMYCIN 0.5 SENSITIVE Sensitive     CLINDAMYCIN <=0.25 SENSITIVE Sensitive     PENICILLIN <=0.06 SENSITIVE Sensitive     CEFTRIAXONE <=  0.12 SENSITIVE Sensitive     LEVOFLOXACIN 2 SENSITIVE Sensitive     * VIRIDANS STREPTOCOCCUS  Blood Culture (routine x 2)     Status: None   Collection Time: 02/25/15  9:15 PM  Result Value Ref Range Status   Specimen Description BLOOD LEFT FOREARM  Final   Special Requests BOTTLES DRAWN AEROBIC AND ANAEROBIC 5CC  Final   Culture  Setup Time   Final    GRAM POSITIVE COCCI IN PAIRS IN BOTH AEROBIC AND ANAEROBIC BOTTLES CRITICAL RESULT CALLED TO, READ BACK BY AND VERIFIED WITH: K BREINICH 02/26/15 @ 1209 M VESTAL    Culture   Final    VIRIDANS STREPTOCOCCUS SUSCEPTIBILITIES PERFORMED ON PREVIOUS CULTURE WITHIN THE LAST 5 DAYS. Performed at [facility]    Report Status 02/28/2015 FINAL  Final  Urine culture     Status: None   Collection Time: 02/25/15 10:59 PM  Result Value Ref Range Status   Specimen Description URINE, CLEAN CATCH  Final   Special Requests NONE  Final   Culture   Final    8,000 COLONIES/mL INSIGNIFICANT GROWTH Performed at [facility]    Report Status 02/27/2015 FINAL  Final     Scheduled Meds: . cefTRIAXone (ROCEPHIN)  IV  2 g Intravenous Q24H  . clotrimazole   Topical BID  . diclofenac  1 patch Transdermal BID  . docusate sodium  100 mg Oral BID  . folic acid  1 mg Oral Daily  . gabapentin  300 mg Oral BID  . multivitamin-lutein  1 capsule Oral BID   . pantoprazole  40 mg Oral Daily  . predniSONE  5 mg Oral Q breakfast  . rOPINIRole  1 mg Oral Daily  . simvastatin  20 mg Oral QHS  . sodium chloride  3 mL Intravenous Q12H  . warfarin  6 mg Oral ONCE-1800  . Warfarin - Pharmacist Dosing Inpatient   Does not apply q1800   Continuous Infusions:   Time spent coordinating care: 25 minutes, greater than 50% of this time was spent in counseling, explanation of diagnosis, planning of further management, discussing with cardiology, EP   Pamella Pert, MD [facility] Pager [phone]. If 7 PM - 7 AM, please contact night-coverage at [email or web address], password [facility] 02/28/2015, 12:13 PM  LOS: 2 days

## 2015-03-01 ENCOUNTER — Ambulatory Visit: Payer: Self-pay | Admitting: General Surgery

## 2015-03-01 ENCOUNTER — Encounter: Payer: Self-pay | Admitting: Cardiology

## 2015-03-01 ENCOUNTER — Encounter (HOSPITAL_COMMUNITY): Payer: Self-pay | Admitting: Dentistry

## 2015-03-01 DIAGNOSIS — T826XXD Infection and inflammatory reaction due to cardiac valve prosthesis, subsequent encounter: Secondary | ICD-10-CM

## 2015-03-01 DIAGNOSIS — K047 Periapical abscess without sinus: Secondary | ICD-10-CM | POA: Insufficient documentation

## 2015-03-01 DIAGNOSIS — M4647 Discitis, unspecified, lumbosacral region: Secondary | ICD-10-CM

## 2015-03-01 DIAGNOSIS — I340 Nonrheumatic mitral (valve) insufficiency: Secondary | ICD-10-CM

## 2015-03-01 DIAGNOSIS — I38 Endocarditis, valve unspecified: Secondary | ICD-10-CM

## 2015-03-01 DIAGNOSIS — B954 Other streptococcus as the cause of diseases classified elsewhere: Secondary | ICD-10-CM

## 2015-03-01 DIAGNOSIS — I351 Nonrheumatic aortic (valve) insufficiency: Secondary | ICD-10-CM

## 2015-03-01 DIAGNOSIS — I482 Chronic atrial fibrillation: Secondary | ICD-10-CM

## 2015-03-01 LAB — CBC
HEMATOCRIT: 29.4 % — AB (ref 39.0–52.0)
HEMOGLOBIN: 9.2 g/dL — AB (ref 13.0–17.0)
MCH: 29.3 pg (ref 26.0–34.0)
MCHC: 31.3 g/dL (ref 30.0–36.0)
MCV: 93.6 fL (ref 78.0–100.0)
Platelets: 150 10*3/uL (ref 150–400)
RBC: 3.14 MIL/uL — AB (ref 4.22–5.81)
RDW: 13.5 % (ref 11.5–15.5)
WBC: 6.2 10*3/uL (ref 4.0–10.5)

## 2015-03-01 LAB — PROTIME-INR
INR: 2.29 — ABNORMAL HIGH (ref 0.00–1.49)
Prothrombin Time: 25 seconds — ABNORMAL HIGH (ref 11.6–15.2)

## 2015-03-01 LAB — BASIC METABOLIC PANEL
Anion gap: 9 (ref 5–15)
BUN: 18 mg/dL (ref 6–20)
CHLORIDE: 106 mmol/L (ref 101–111)
CO2: 24 mmol/L (ref 22–32)
Calcium: 9 mg/dL (ref 8.9–10.3)
Creatinine, Ser: 1.48 mg/dL — ABNORMAL HIGH (ref 0.61–1.24)
GFR calc Af Amer: 46 mL/min — ABNORMAL LOW (ref >60)
GFR calc non Af Amer: 40 mL/min — ABNORMAL LOW (ref >60)
GLUCOSE: 98 mg/dL (ref 65–99)
POTASSIUM: 4.4 mmol/L (ref 3.5–5.1)
Sodium: 139 mmol/L (ref 135–145)

## 2015-03-01 MED ORDER — BISACODYL 5 MG PO TBEC
10.0000 mg | DELAYED_RELEASE_TABLET | Freq: Once | ORAL | Status: AC
Start: 2015-03-01 — End: 2015-03-01
  Administered 2015-03-01: 10 mg via ORAL
  Filled 2015-03-01: qty 2

## 2015-03-01 MED ORDER — WARFARIN SODIUM 3 MG PO TABS
3.0000 mg | ORAL_TABLET | Freq: Once | ORAL | Status: AC
  Administered 2015-03-01: 3 mg via ORAL
  Filled 2015-03-01: qty 1

## 2015-03-01 MED ORDER — POLYETHYLENE GLYCOL 3350 17 G PO PACK
17.0000 g | PACK | Freq: Every day | ORAL | Status: AC
  Administered 2015-03-01: 17 g via ORAL
  Filled 2015-03-01 (×2): qty 1

## 2015-03-01 MED ORDER — WARFARIN SODIUM 3 MG PO TABS
3.0000 mg | ORAL_TABLET | Freq: Once | ORAL | Status: DC
  Filled 2015-03-01: qty 1

## 2015-03-01 MED ORDER — FUROSEMIDE 20 MG PO TABS
20.0000 mg | ORAL_TABLET | Freq: Every day | ORAL | Status: DC
  Administered 2015-03-01 – 2015-03-08 (×8): 20 mg via ORAL
  Filled 2015-03-01 (×8): qty 1

## 2015-03-01 NOTE — Progress Notes (Signed)
ANTICOAGULATION CONSULT NOTE - Follow Up Consult  Pharmacy Consult for coumadin Indication: atrial fibrillation  Allergies  Allergen Reactions  . Tape Other (See Comments)    Pulls skin off  . Vancomycin Other (See Comments)    Hearing loss    Patient Measurements: Height: 5\' 10"  (177.8 cm) Weight: 180 lb 6.4 oz (81.829 kg) IBW/kg (Calculated) : 73   Vital Signs: Temp: 98.6 F (37 C) (12/21 0547) Temp Source: Oral (12/21 0547) BP: 129/59 mmHg (12/21 0547) Pulse Rate: 84 (12/21 0547)  Labs:  Recent Labs  02/27/15 0750 02/27/15 1049 02/28/15 0743 03/01/15 0614  HGB 9.2* 9.1*  --  9.2*  HCT 30.0* 28.9*  --  29.4*  PLT 122* 120*  --  150  LABPROT 23.6*  --  28.7* 25.0*  INR 2.12*  --  2.75* 2.29*  CREATININE 1.73*  --   --  1.48*    Estimated Creatinine Clearance: 34.3 mL/min (by C-G formula based on Cr of 1.48).    Assessment: 81 y.o. F presents with severe back pain. She is on coumadin PTA for afib and pharmacy consulted to dose.  Home dose: 6mg  daily except 3 mg on Tues and Sat. Missed dose on 12/17.    Patient received 6mg  dose on 12/18 but not on 12/19. INR jumped from 2.1 to 2.75 so 12/20 dose held. INR today 2.29 - H/H stable, no bleeding noted.  Goal of Therapy:  INR 2-3 Monitor platelets by anticoagulation protocol: Yes   Plan:  -Coumadin 3 mg*1 tonight  -Daily INR for now -Q72h CBC    Sherron MondayAubrey N. Marleena Shubert, PharmD Clinical Pharmacy Resident Pager: [phone] 03/01/2015 9:52 AM

## 2015-03-01 NOTE — Progress Notes (Signed)
Patient Demographics  Ethan Lawrence, is a 81 y.o. male, DOB - 1922-04-16, ZOX:[id number]  Admit date - 02/25/2015   Admitting Physician Ron Parker, MD  Outpatient Primary MD for the patient is PROVIDER NOT IN SYSTEM  LOS - 3   Chief Complaint  Patient presents with  . Back Pain       Admission HPI/Brief narrative: 81 yo very functional male history of CAD, CHF,Chronic Atrial Fib on Coumadin Rx, AV Block S/P Pacemaker, AS, AAA S/P Repair, HTN, admitted with back pain progressive over the past 2-3 days and febrile illness for a week. He has been running a fever nightly for the past week, found to have fever of 103 on admission. [name] blood cultures speciated GPC and ID was consulted. He underwent a TEE which showed possible endocarditis. He has a very tender right sided inguinal hernia and surgery evaluated without need for acute interventions.   Subjective:   [name] today has, No headache, No chest pain, No abdominal pain - No Nausea, days of constipation, and lower back.  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 - Blood culture growing Streptococcus viridans - Sepsis secondary to strep viridans bacteremia/endocarditis, questionable discitis, unable to obtain MRI lumbar spine  secondary to pacemaker, unable to obtain CT lumbar spine with contrast secondary to chronic kidney disease. - Currently afebrile, no leukocytosis - Physiology of sepsis resolved  Strep Viridans bacteremia/ Endocarditis  - TEE on 12/20 showing a 8 mm x 4 mm mobile target on  mitral valve and  prosthetic aortic valve sewing ring. No vegetations were noted on the pacer lead. - ID input greatly appreciated, plan to continue with IV antibiotics for 6-8 weeks. - IV antibiotic has been narrowed to Rocephin 2 g IV daily. - Discussed with CT surgery Dr. Morton Peters, currently patient is not a surgical candidate - Source most likely related to dental abscess, as patient has evidence of periapical abscess on the left in the mandible adjacent to the root of the second bicuspid, didn't test has been consulted, will hold warfarin for possible need for surgical intervention.   Inguinal hernia - general surgery consulted given possible right inguinal hernia and tenderness in that area, defer any intervention for now   New onset severe back pain - ? Infectious etiology - Pacemaker none compatible with MRI, CT lumbar spine with IV contrast can be obtained due to CKD. - No evidence of discitis on CT lumbar spine without contrast, but low yield given his without contrast  Anemia - chronic, stable  Chronic A fib -On warfarin, will hold for possible need for dental  procedure  CAD s/p CABG - stable, no chest pain  AV block s/p pacemaker  HTN - continue home medications  Acute on chronic diastolic heart failure - some vascular congestion on CXR, on room air comfortable in the room , will resume Lasix given worsening lower extremity edema.   CKD stage III - Cr close to baseline, received IV contrast, closely monitor - Cr stable   Thrombocytopenia - chronic, stable  Code Status: Full  Family Communication: son in law at bedside  Disposition Plan: Pending further workup   Procedures     TEE Impressions: - There is a 8 mm x 4 mm mobile target on the atrial side of themitral valve at the region of the P3 segment that is concerning for endocarditis given the clinical scenario. Cannot rule outruptured cord. There is no associated valve destruction and themitral regurgitation appears to be separate from the suspectedlesion. There is also a mobile target on the superior surface of the prosthetic aortic valve sewing ring. The aortic valve gradients are moderately elevated, consistent with moderate steonis, asseen on transthoracic echo. The valve is well-seated and there isno rocking motion. There is no aortic reguritation.No vegetations were noted on the pacer lead.   Consults   Infectious disease Gen. surgery Cardiology via phone CT surgery Dr. Morton PetersVan Tright via phone   Medications  Scheduled Meds: . bisacodyl  10 mg Oral Once  . cefTRIAXone (ROCEPHIN)  IV  2 g Intravenous Q24H  . clotrimazole   Topical BID  . diclofenac  1 patch Transdermal BID  . docusate sodium  100 mg Oral BID  . folic acid  1 mg Oral Daily  . gabapentin  300 mg Oral BID  . multivitamin-lutein  1 capsule Oral BID  . pantoprazole  40 mg Oral Daily  . polyethylene glycol  17 g Oral Daily  . predniSONE  5 mg Oral Q breakfast  . rOPINIRole  1 mg Oral Daily  . simvastatin  20 mg Oral QHS  . sodium chloride  3 mL Intravenous Q12H  . Warfarin - Pharmacist Dosing Inpatient   Does not apply q1800   Continuous Infusions:  PRN Meds:.sodium chloride, acetaminophen **OR** acetaminophen, alum & mag hydroxide-simeth, HYDROmorphone (DILAUDID) injection, HYDROmorphone (DILAUDID) injection, methocarbamol, ondansetron **OR** ondansetron (ZOFRAN) IV, oxyCODONE, oxyCODONE-acetaminophen, sodium chloride  DVT Prophylaxis  warfarin  Lab Results  Component Value Date   PLT 150 03/01/2015    Antibiotics    Anti-infectives    Start     Dose/Rate Route Frequency Ordered Stop   02/28/15 1200   cefTRIAXone (ROCEPHIN) 2 g in dextrose 5 % 50 mL IVPB     2 g 100 mL/hr over 30 Minutes Intravenous Every 24 hours 02/28/15 1055     02/27/15 2000  Ampicillin-Sulbactam (UNASYN) 3 g in sodium chloride 0.9 % 100 mL IVPB  Status:  Discontinued     3 g 100 mL/hr over 60 Minutes Intravenous Every 12 hours 02/27/15 1931 02/28/15 1054   02/27/15 0600  vancomycin (VANCOCIN) IVPB 1000 mg/200 mL premix  Status:  Discontinued     1,000 mg 200 mL/hr over 60 Minutes Intravenous Every 24 hours 02/26/15 0706 02/28/15 1054   02/26/15 1500  piperacillin-tazobactam (ZOSYN) IVPB 3.375 g  Status:  Discontinued     3.375 g 12.5 mL/hr over 240 Minutes Intravenous 3 times per day 02/26/15 0706 02/27/15 1859   02/26/15 0730  piperacillin-tazobactam (ZOSYN) IVPB 3.375 g     3.375 g 100 mL/hr over 30 Minutes Intravenous  STAT 02/26/15 0706 02/26/15 1105   02/26/15 0530  vancomycin (VANCOCIN) 1,250 mg in sodium chloride 0.9 % 250 mL IVPB     1,250 mg 166.7 mL/hr over 90 Minutes Intravenous  Once 02/26/15 0517 02/26/15 0712   02/25/15 2045  cefTRIAXone (ROCEPHIN) 2 g in dextrose 5 % 50 mL IVPB     2 g 100 mL/hr over 30 Minutes Intravenous  Once 02/25/15 2040 02/25/15 2244          Objective:   Filed Vitals:   02/28/15 2158 03/01/15 0151 03/01/15 0547 03/01/15 1001  BP: 144/67 139/66 129/59 125/67  Pulse: 85  84 99  Temp: 98.7 F (37.1 C) 98.2 F (36.8 C) 98.6 F (37 C) 99.2 F (37.3 C)  TempSrc: Oral Tympanic Oral Oral  Resp: Height:      Weight:      SpO2: 99% 99% 97% 99%    Wt Readings from Last 3 Encounters:  02/26/15 81.829 kg (180 lb 6.4 oz)  02/23/15 77.656 kg (171 lb 3.2 oz)  02/21/15 78.291 kg (172 lb 9.6 oz)     Intake/Output Summary (Last 24 hours) at 03/01/15 1050 Last data filed at 03/01/15 1010  Gross per 24 hour  Intake    390 ml  Output   3100 ml  Net  -2710 ml     Physical Exam  Awake Alert, Oriented X 3,  Craig.AT,PERRAL Supple Neck,No JVD,   Symmetrical Chest wall movement, Good air movement bilaterally, CTAB irregular, murmur +, No Gallops,Rubs , No Parasternal Heave +ve B.Sounds, Abd Soft, No tenderness, No organomegaly appriciated, No rebound - guarding or rigidity. No Cyanosis, Clubbing , No new Rash or bruise     Data Review   Micro Results Recent Results (from the past 240 hour(s))  Blood Culture (routine x 2)     Status: None   Collection Time: 02/25/15  8:45 PM  Result Value Ref Range Status   Specimen Description BLOOD RIGHT FOREARM  Final   Special Requests BOTTLES DRAWN AEROBIC AND ANAEROBIC 5C  Final   Culture  Setup Time   Final    GRAM POSITIVE COCCI IN PAIRS CRITICAL RESULT CALLED TO, READ BACK BY AND VERIFIED WITH: K BREINICH 02/26/15 @ 1209 M VESTAL IN BOTH AEROBIC AND ANAEROBIC BOTTLES    Culture   Final    VIRIDANS STREPTOCOCCUS Performed at [facility]    Report Status 02/28/2015 FINAL  Final   Organism ID, Bacteria VIRIDANS STREPTOCOCCUS  Final      Susceptibility   Viridans streptococcus - MIC*    ERYTHROMYCIN <=0.12 SENSITIVE Sensitive     TETRACYCLINE >=16 RESISTANT Resistant     VANCOMYCIN 0.5 SENSITIVE Sensitive     CLINDAMYCIN <=0.25 SENSITIVE Sensitive     PENICILLIN <=0.06 SENSITIVE Sensitive     CEFTRIAXONE <=0.12 SENSITIVE Sensitive     LEVOFLOXACIN 2 SENSITIVE Sensitive     * VIRIDANS STREPTOCOCCUS  Blood Culture (routine x 2)     Status: None   Collection Time: 02/25/15  9:15 PM  Result Value Ref Range Status   Specimen Description BLOOD LEFT FOREARM  Final   Special Requests BOTTLES DRAWN AEROBIC AND ANAEROBIC 5CC  Final   Culture  Setup Time   Final    GRAM POSITIVE COCCI IN PAIRS IN BOTH AEROBIC AND ANAEROBIC BOTTLES CRITICAL RESULT CALLED TO, READ BACK BY AND VERIFIED WITH: K BREINICH 02/26/15 @ 1209 M VESTAL    Culture   Final  VIRIDANS STREPTOCOCCUS SUSCEPTIBILITIES PERFORMED ON PREVIOUS CULTURE WITHIN THE LAST 5 DAYS. Performed at [facility]     Report Status 02/28/2015 FINAL  Final  Urine culture     Status: None   Collection Time: 02/25/15 10:59 PM  Result Value Ref Range Status   Specimen Description URINE, CLEAN CATCH  Final   Special Requests NONE  Final   Culture   Final    8,000 COLONIES/mL INSIGNIFICANT GROWTH Performed at [facility]    Report Status 02/27/2015 FINAL  Final  Culture, blood (Routine X 2) w Reflex to ID Panel     Status: None (Preliminary result)   Collection Time: 02/27/15  6:45 PM  Result Value Ref Range Status   Specimen Description BLOOD LEFT ARM  Final   Special Requests BOTTLES DRAWN AEROBIC AND ANAEROBIC 5CC  Final   Culture NO GROWTH < 24 HOURS  Final   Report Status PENDING  Incomplete  Culture, blood (Routine X 2) w Reflex to ID Panel     Status: None (Preliminary result)   Collection Time: 02/27/15  6:50 PM  Result Value Ref Range Status   Specimen Description BLOOD LEFT HAND  Final   Special Requests IN PEDIATRIC BOTTLE 1CC  Final   Culture NO GROWTH < 24 HOURS  Final   Report Status PENDING  Incomplete    Radiology Reports Dg Orthopantogram  02/28/2015  CLINICAL DATA:  Jaw pain EXAM: ORTHOPANTOGRAM/PANORAMIC COMPARISON:  None. FINDINGS: The patient is predominantly edentulous. On the left adjacent to the root of the second mandibular bicuspid there is a circular lucency identified suggestive of periapical abscess. IMPRESSION: Likely periapical abscess on the left in the mandible adjacent to the root of the second bicuspid. Electronically Signed   By: Alcide Clever M.D.   On: 02/28/2015 14:13   Dg Chest 1 View  02/25/2015  CLINICAL DATA:  81 year old male with back pain for 6 hours. EXAM: CHEST 1 VIEW COMPARISON:  Chest radiograph 01/20/2013. Included lung bases from CT abdomen/pelvis performed concurrently. FINDINGS: Single lead left-sided pacemaker remains in place. Patient is post median sternotomy with 2 prosthetic valves. Multi chamber cardiomegaly, progressed from prior  exam. There is tortuosity and atherosclerosis of the thoracic aorta. Vascular congestion with possible mild perihilar edema. Calcified granuloma in the right upper lung. Small pleural effusions on CT are not seen radiographically. No confluent airspace disease. No pneumothorax. IMPRESSION: Cardiomegaly with vascular congestion and questionable perihilar edema. Recommend correlation for CHF. Electronically Signed   By: Rubye Oaks M.D.   On: 02/25/2015 22:30   Dg Thoracic Spine 2 View  02/25/2015  CLINICAL DATA:  Lower back pain since 4:30 this afternoon. History of chronic back pain. No injury noted. EXAM: THORACIC SPINE 2 VIEWS COMPARISON:  Chest x-ray dated 01/20/2013. FINDINGS: Portions of the thoracic spine are incompletely seen due to overlying osseous and soft tissue structures. Overall osseous alignment appears normal and grossly stable compared to a previous chest x-ray of 01/20/2013. No osseous fracture or dislocation seen. No acute - appearing cortical irregularity or osseous lesion. Paravertebral soft tissues are unremarkable for acute process. IMPRESSION: No acute findings. Electronically Signed   By: Bary Richard M.D.   On: 02/25/2015 22:29   Dg Lumbar Spine Complete  02/25/2015  CLINICAL DATA:  81 year old male with lumbosacral back pain for 6 hours. EXAM: LUMBAR SPINE - COMPLETE 4+ VIEW COMPARISON:  Reformats from CT abdomen/ pelvis 09/02/2011. FINDINGS: Kyphoplasty within L4 compression deformity. Remaining vertebral body heights are maintained.  Mild levoscoliosis centered at L2-L3 with associated degenerative disc disease. Additional disc space narrowing at L1-L2. The bones are under mineralized. Aorto bi-iliac stent in place. IMPRESSION: 1. Kyphoplasty within L4 compression deformity. 2. Scoliosis and degenerative change.  No acute bony abnormality. Electronically Signed   By: Rubye Oaks M.D.   On: 02/25/2015 22:26   Ct Lumbar Spine Wo Contrast  02/28/2015  CLINICAL DATA:   Several day history of low back pain. EXAM: CT LUMBAR SPINE WITHOUT CONTRAST TECHNIQUE: Multidetector CT imaging of the lumbar spine was performed without intravenous contrast administration. Multiplanar CT image reconstructions were also generated. COMPARISON:  CT scan 02/25/2015 FINDINGS: Remote vertebral augmentation changes at L4 a with a significant compression deformity. Mild stable retropulsion and mild canal encroachment. New line the other lumbar vertebral bodies are maintained. No acute fractures identified. S1 is not covered on this examination. Stable advanced degenerative disc disease at L3-4 along with advanced facet disease at L3-4 and L4-5. There are moderate-sized bilateral pleural effusions noted along with overlying basilar atelectasis. Stable aortoiliac stent graft. No complicating features are demonstrated. L1-2: Broad-based bulging annulus and osteophytic ridging with mild left lateral recess and left foraminal encroachment. This could irritate the left L1 nerve root. L2-3: Advanced degenerative disc disease and facet disease with a bulging annulus and osteophytic ridging. Mild flattening of the ventral thecal sac but no significant spinal or foraminal stenosis. L3-4: Wide decompressive laminectomy and vertebral augmentation changes. No significant spinal stenosis. Right-sided disc osteophyte complex with significant right foraminal stenosis likely effecting the right L3 nerve root. L4-5: Bulging annulus, osteophytic ridging and facet disease with mild spinal and bilateral lateral recess stenosis. No significant foraminal stenosis. L5-S1:  Bulging annulus but no spinal or foraminal stenosis. IMPRESSION: 1. Remote L4 compression fracture with vertebral augmentation changes. 2. No new/acute fractures are identified. 3. Mild left lateral recess and left foraminal stenosis at L1-2. 4. Right foraminal/extra foraminal disc osteophyte complex on the right at L3-4 likely effecting the right L3 nerve  root. 5. Mild spinal and bilateral lateral recess stenosis at L4-5. 6. Bilateral moderate-sized pleural effusions. Electronically Signed   By: Rudie Meyer M.D.   On: 02/28/2015 18:40   Ct Abdomen Pelvis W Contrast  02/25/2015  CLINICAL DATA:  Severe low back pain with any movement. History of chronic back pain. Abdominal pain and tenderness. EXAM: CT ABDOMEN AND PELVIS WITH CONTRAST TECHNIQUE: Multidetector CT imaging of the abdomen and pelvis was performed using the standard protocol following bolus administration of intravenous contrast. CONTRAST:  70mL OMNIPAQUE IOHEXOL 300 MG/ML  SOLN COMPARISON:  09/02/2011 FINDINGS: Small bilateral pleural effusions. Multiple surgical clips at the EG junction. Postoperative changes in the mediastinum. Surgical absence of the gallbladder. Calcified granulomas scattered throughout the liver and spleen. No hepatosplenomegaly. The pancreas, adrenal glands, inferior vena cava, and retroperitoneal lymph nodes are unremarkable. Abdominal aortic aneurysm post aortoiliac stent grafts. Native sac diameter is 5.6 cm. This measurement is similar to the previous study. Kidneys are atrophic bilaterally. Renal nephrograms appear symmetrical. No hydronephrosis. Large cysts in both kidneys, largest is off of the lower pole of the left kidney and measures about 8.9 cm diameter. This cyst is increased in size since the previous study. The stomach, small bowel, and colon are not abnormally distended. The colon is diffusely stool-filled. No free air or free fluid in the abdomen. Surgical clips in the anterior abdominal wall. Pelvis: Prostate gland is not enlarged. Prostate defect may indicate previous TUR procedure. Bladder wall is not thickened.  Appendix is surgically absent. No free or loculated pelvic fluid collections. No pelvic mass or lymphadenopathy. Vascular calcifications in the pelvis. Fat in the inguinal canals. Degenerative changes in the lumbar spine. L4 compression deformity  post kyphoplasty. No destructive bone lesions. IMPRESSION: No significant acute process demonstrated in the abdomen or pelvis. There are small bilateral pleural effusions. Postoperative changes demonstrated throughout the chest, abdomen, and pelvis. Abdominal aortic aneurysm post stent graft. Native sac diameter is 5.6 cm without change since prior study. Multiple renal cysts with enlarging large cyst on the left kidney. Electronically Signed   By: Burman Nieves M.D.   On: 02/25/2015 22:42   Ct Maxillofacial Ltd Wo Cm  02/28/2015  CLINICAL DATA:  81 year old male with sinusitis and bacteremia. EXAM: CT PARANASAL SINUS LIMITED WITHOUT CONTRAST TECHNIQUE: Non-contiguous multidetector CT images of the paranasal sinuses were obtained in a single plane without contrast. COMPARISON:  None. FINDINGS: There is no acute fracture of the visualized facial bones. Bilateral cataract surgeries noted. The globes are otherwise intact. The retro-orbital fat and orbital walls are preserved. The paranasal sinuses are well aerated. No significant mucoperiosteal thickening. No air-fluid level. No soft tissue swelling noted. No acute hemorrhage identified within the visualized brain. IMPRESSION: Unremarkable CT of the paranasal sinuses. Electronically Signed   By: Elgie Collard M.D.   On: 02/28/2015 18:31     CBC  Recent Labs Lab 02/23/15 1633 02/25/15 2045 02/27/15 0750 02/27/15 1049 03/01/15 0614  WBC 5.1 5.4 7.0 7.8 6.2  HGB 10.4* 9.3* 9.2* 9.1* 9.2*  HCT 31.7* 29.3* 30.0* 28.9* 29.4*  PLT  --  102* 122* 120* 150  MCV 88.7 91.8 93.8 92.0 93.6  MCH 29.2 29.2 28.8 29.0 29.3  MCHC 33.0 31.7 30.7 31.5 31.3  RDW  --  13.5 13.6 13.6 13.5  LYMPHSABS  --  0.6*  --   --   --   MONOABS  --  0.4  --   --   --   EOSABS  --  0.0  --   --   --   BASOSABS  --  0.0  --   --   --     Chemistries   Recent Labs Lab 02/25/15 2045 02/27/15 0750 03/01/15 0614  NA 139 138 139  K 4.3 4.5 4.4  CL 108 110 106  CO2  23 20* 24  GLUCOSE 118* 101* 98  BUN 32* 26* 18  CREATININE 1.75* 1.73* 1.48*  CALCIUM 8.1* 8.9 9.0  AST 17  --   --   ALT 10*  --   --   ALKPHOS 86  --   --   BILITOT 0.8  --   --    ------------------------------------------------------------------------------------------------------------------ estimated creatinine clearance is 34.3 mL/min (by C-G formula based on Cr of 1.48). ------------------------------------------------------------------------------------------------------------------ No results for input(s): HGBA1C in the last 72 hours. ------------------------------------------------------------------------------------------------------------------ No results for input(s): CHOL, HDL, LDLCALC, TRIG, CHOLHDL, LDLDIRECT in the last 72 hours. ------------------------------------------------------------------------------------------------------------------ No results for input(s): TSH, T4TOTAL, T3FREE, THYROIDAB in the last 72 hours.  Invalid input(s): FREET3 ------------------------------------------------------------------------------------------------------------------ No results for input(s): VITAMINB12, FOLATE, FERRITIN, TIBC, IRON, RETICCTPCT in the last 72 hours.  Coagulation profile  Recent Labs Lab 02/26/15 0713 02/27/15 0750 02/28/15 0743 03/01/15 0614  INR 2.40* 2.12* 2.75* 2.29*    No results for input(s): DDIMER in the last 72 hours.  Cardiac Enzymes No results for input(s): CKMB, TROPONINI, MYOGLOBIN in the last 168 hours.  Invalid input(s): CK ------------------------------------------------------------------------------------------------------------------ Invalid input(s): POCBNP  Time Spent in minutes   30 minutes    Sarie Stall M.D on 03/01/2015 at 10:50 AM  Between 7am to 7pm - Pager - [phone]  After 7pm go to [email or web address] - password [facility]  [facility]   Office  [phone]

## 2015-03-01 NOTE — Care Management Important Message (Signed)
Important Message  Patient Details  Name: Providence LaniusCharles E Lacorte MRN: [id number] Date of Birth: 11-30-1922   Medicare Important Message Given:  Yes    Mattheo Swindle P Delitha Elms 03/01/2015, 3:29 PM

## 2015-03-01 NOTE — Progress Notes (Signed)
[facility]    Subjective: Severe back pain is a bit better post narcotics    Antibiotics:  Anti-infectives    Start     Dose/Rate Route Frequency Ordered Stop   02/28/15 1200  cefTRIAXone (ROCEPHIN) 2 g in dextrose 5 % 50 mL IVPB     2 g 100 mL/hr over 30 Minutes Intravenous Every 24 hours 02/28/15 1055     02/27/15 2000  Ampicillin-Sulbactam (UNASYN) 3 g in sodium chloride 0.9 % 100 mL IVPB  Status:  Discontinued     3 g 100 mL/hr over 60 Minutes Intravenous Every 12 hours 02/27/15 1931 02/28/15 1054   02/27/15 0600  vancomycin (VANCOCIN) IVPB 1000 mg/200 mL premix  Status:  Discontinued     1,000 mg 200 mL/hr over 60 Minutes Intravenous Every 24 hours 02/26/15 0706 02/28/15 1054   02/26/15 1500  piperacillin-tazobactam (ZOSYN) IVPB 3.375 g  Status:  Discontinued     3.375 g 12.5 mL/hr over 240 Minutes Intravenous 3 times per day 02/26/15 0706 02/27/15 1859   02/26/15 0730  piperacillin-tazobactam (ZOSYN) IVPB 3.375 g     3.375 g 100 mL/hr over 30 Minutes Intravenous STAT 02/26/15 0706 02/26/15 1105   02/26/15 0530  vancomycin (VANCOCIN) 1,250 mg in sodium chloride 0.9 % 250 mL IVPB     1,250 mg 166.7 mL/hr over 90 Minutes Intravenous  Once 02/26/15 0517 02/26/15 0712   02/25/15 2045  cefTRIAXone (ROCEPHIN) 2 g in dextrose 5 % 50 mL IVPB     2 g 100 mL/hr over 30 Minutes Intravenous  Once 02/25/15 2040 02/25/15 2244      Medications: Scheduled Meds: . cefTRIAXone (ROCEPHIN)  IV  2 g Intravenous Q24H  . clotrimazole   Topical BID  . diclofenac  1 patch Transdermal BID  . docusate sodium  100 mg Oral BID  . folic acid  1 mg Oral Daily  . furosemide  20 mg Oral Daily  . gabapentin  300 mg Oral BID  . multivitamin-lutein  1 capsule Oral BID  . pantoprazole  40 mg Oral Daily  . polyethylene glycol  17 g Oral Daily  . predniSONE  5 mg Oral Q breakfast  . rOPINIRole  1 mg Oral Daily  . simvastatin  20 mg Oral QHS  . sodium  chloride  3 mL Intravenous Q12H  . warfarin  3 mg Oral ONCE-1800  . Warfarin - Pharmacist Dosing Inpatient   Does not apply q1800   Continuous Infusions:  PRN Meds:.sodium chloride, acetaminophen **OR** acetaminophen, alum & mag hydroxide-simeth, HYDROmorphone (DILAUDID) injection, HYDROmorphone (DILAUDID) injection, methocarbamol, ondansetron **OR** ondansetron (ZOFRAN) IV, oxyCODONE, oxyCODONE-acetaminophen, sodium chloride    Objective: Weight change:   Intake/Output Summary (Last 24 hours) at 03/01/15 1718 Last data filed at 03/01/15 1010  Gross per 24 hour  Intake    390 ml  Output   2425 ml  Net  -2035 ml   Blood pressure 106/67, pulse 97, temperature 98.5 F (36.9 C), temperature source Oral, resp. rate 20, height 5' 10"  (1.778 m), weight 180 lb 6.4 oz (81.829 kg), SpO2 100 %. Temp:  [98.2 F (36.8 C)-99.8 F (37.7 C)] 98.5 F (36.9 C) (12/21 1429) Pulse Rate:  [73-99] 97 (12/21 1429) Resp:  [18-20] 20 (12/21 1429) BP: (106-158)/(59-92) 106/67 mmHg (12/21 1429) SpO2:  [97 %-100 %] 100 % (12/21 1429)  Physical Exam: General: Alert and awake, not in any acute distress. HEENT: anicteric sclera,  EOMI OP clear, he has teeth on lower mandible  CVS irr irregular rate, normal r,  2/VI murmur at RUSB Chest: clear to auscultation bilaterally, no wheezing, rales or rhonchi Abdomen: soft nontender, nondistended, normal bowel sounds, Extremities 2+ edema Neuro: nonfocal  CBC: CBC Latest Ref Rng 03/01/2015 02/27/2015 02/27/2015  WBC 4.0 - 10.5 K/uL 6.2 7.8 7.0  Hemoglobin 13.0 - 17.0 g/dL 9.2(L) 9.1(L) 9.2(L)  Hematocrit 39.0 - 52.0 % 29.4(L) 28.9(L) 30.0(L)  Platelets 150 - 400 K/uL 150 120(L) 122(L)       BMET  Recent Labs  02/27/15 0750 03/01/15 0614  NA 138 139  K 4.5 4.4  CL 110 106  CO2 20* 24  GLUCOSE 101* 98  BUN 26* 18  CREATININE 1.73* 1.48*  CALCIUM 8.9 9.0     Liver Panel  No results for input(s): PROT, ALBUMIN, AST, ALT, ALKPHOS, BILITOT,  BILIDIR, IBILI in the last 72 hours.     Sedimentation Rate No results for input(s): ESRSEDRATE in the last 72 hours. C-Reactive Protein No results for input(s): CRP in the last 72 hours.  Micro Results: Recent Results (from the past 720 hour(s))  Blood Culture (routine x 2)     Status: None   Collection Time: 02/25/15  8:45 PM  Result Value Ref Range Status   Specimen Description BLOOD RIGHT FOREARM  Final   Special Requests BOTTLES DRAWN AEROBIC AND ANAEROBIC 5C  Final   Culture  Setup Time   Final    GRAM POSITIVE COCCI IN PAIRS CRITICAL RESULT CALLED TO, READ BACK BY AND VERIFIED WITH: K Browndell 02/26/15 @ 32 M VESTAL IN BOTH AEROBIC AND ANAEROBIC BOTTLES    Culture   Final    VIRIDANS STREPTOCOCCUS Performed at [facility]    Report Status 02/28/2015 FINAL  Final   Organism ID, Bacteria VIRIDANS STREPTOCOCCUS  Final      Susceptibility   Viridans streptococcus - MIC*    ERYTHROMYCIN <=0.12 SENSITIVE Sensitive     TETRACYCLINE >=16 RESISTANT Resistant     VANCOMYCIN 0.5 SENSITIVE Sensitive     CLINDAMYCIN <=0.25 SENSITIVE Sensitive     PENICILLIN <=0.06 SENSITIVE Sensitive     CEFTRIAXONE <=0.12 SENSITIVE Sensitive     LEVOFLOXACIN 2 SENSITIVE Sensitive     * VIRIDANS STREPTOCOCCUS  Blood Culture (routine x 2)     Status: None   Collection Time: 02/25/15  9:15 PM  Result Value Ref Range Status   Specimen Description BLOOD LEFT FOREARM  Final   Special Requests BOTTLES DRAWN AEROBIC AND ANAEROBIC 5CC  Final   Culture  Setup Time   Final    GRAM POSITIVE COCCI IN PAIRS IN BOTH AEROBIC AND ANAEROBIC BOTTLES CRITICAL RESULT CALLED TO, READ BACK BY AND VERIFIED WITH: K La Rose 02/26/15 @ 87 M VESTAL    Culture   Final    VIRIDANS STREPTOCOCCUS SUSCEPTIBILITIES PERFORMED ON PREVIOUS CULTURE WITHIN THE LAST 5 DAYS. Performed at [facility]    Report Status 02/28/2015 FINAL  Final  Urine culture     Status: None   Collection Time: 02/25/15  10:59 PM  Result Value Ref Range Status   Specimen Description URINE, CLEAN CATCH  Final   Special Requests NONE  Final   Culture   Final    8,000 COLONIES/mL INSIGNIFICANT GROWTH Performed at [facility]    Report Status 02/27/2015 FINAL  Final  Culture, blood (Routine X 2) w Reflex to ID Panel     Status: None (  Preliminary result)   Collection Time: 02/27/15  6:45 PM  Result Value Ref Range Status   Specimen Description BLOOD LEFT ARM  Final   Special Requests BOTTLES DRAWN AEROBIC AND ANAEROBIC 5CC  Final   Culture NO GROWTH 2 DAYS  Final   Report Status PENDING  Incomplete  Culture, blood (Routine X 2) w Reflex to ID Panel     Status: None (Preliminary result)   Collection Time: 02/27/15  6:50 PM  Result Value Ref Range Status   Specimen Description BLOOD LEFT HAND  Final   Special Requests IN PEDIATRIC BOTTLE 1CC  Final   Culture NO GROWTH 2 DAYS  Final   Report Status PENDING  Incomplete    Studies/Results: Dg Orthopantogram  02/28/2015  CLINICAL DATA:  Jaw pain EXAM: ORTHOPANTOGRAM/PANORAMIC COMPARISON:  None. FINDINGS: The patient is predominantly edentulous. On the left adjacent to the root of the second mandibular bicuspid there is a circular lucency identified suggestive of periapical abscess. IMPRESSION: Likely periapical abscess on the left in the mandible adjacent to the root of the second bicuspid. Electronically Signed   By: Inez Catalina M.D.   On: 02/28/2015 14:13   Ct Lumbar Spine Wo Contrast  02/28/2015  CLINICAL DATA:  Several day history of low back pain. EXAM: CT LUMBAR SPINE WITHOUT CONTRAST TECHNIQUE: Multidetector CT imaging of the lumbar spine was performed without intravenous contrast administration. Multiplanar CT image reconstructions were also generated. COMPARISON:  CT scan 02/25/2015 FINDINGS: Remote vertebral augmentation changes at L4 a with a significant compression deformity. Mild stable retropulsion and mild canal encroachment. New line the  other lumbar vertebral bodies are maintained. No acute fractures identified. S1 is not covered on this examination. Stable advanced degenerative disc disease at L3-4 along with advanced facet disease at L3-4 and L4-5. There are moderate-sized bilateral pleural effusions noted along with overlying basilar atelectasis. Stable aortoiliac stent graft. No complicating features are demonstrated. L1-2: Broad-based bulging annulus and osteophytic ridging with mild left lateral recess and left foraminal encroachment. This could irritate the left L1 nerve root. L2-3: Advanced degenerative disc disease and facet disease with a bulging annulus and osteophytic ridging. Mild flattening of the ventral thecal sac but no significant spinal or foraminal stenosis. L3-4: Wide decompressive laminectomy and vertebral augmentation changes. No significant spinal stenosis. Right-sided disc osteophyte complex with significant right foraminal stenosis likely effecting the right L3 nerve root. L4-5: Bulging annulus, osteophytic ridging and facet disease with mild spinal and bilateral lateral recess stenosis. No significant foraminal stenosis. L5-S1:  Bulging annulus but no spinal or foraminal stenosis. IMPRESSION: 1. Remote L4 compression fracture with vertebral augmentation changes. 2. No new/acute fractures are identified. 3. Mild left lateral recess and left foraminal stenosis at L1-2. 4. Right foraminal/extra foraminal disc osteophyte complex on the right at L3-4 likely effecting the right L3 nerve root. 5. Mild spinal and bilateral lateral recess stenosis at L4-5. 6. Bilateral moderate-sized pleural effusions. Electronically Signed   By: Marijo Sanes M.D.   On: 02/28/2015 18:40   Shell Cm  02/28/2015  CLINICAL DATA:  81 year old male with sinusitis and bacteremia. EXAM: CT PARANASAL SINUS LIMITED WITHOUT CONTRAST TECHNIQUE: Non-contiguous multidetector CT images of the paranasal sinuses were obtained in a single  plane without contrast. COMPARISON:  None. FINDINGS: There is no acute fracture of the visualized facial bones. Bilateral cataract surgeries noted. The globes are otherwise intact. The retro-orbital fat and orbital walls are preserved. The paranasal sinuses are well aerated. No significant mucoperiosteal  thickening. No air-fluid level. No soft tissue swelling noted. No acute hemorrhage identified within the visualized brain. IMPRESSION: Unremarkable CT of the paranasal sinuses. Electronically Signed   By: Anner Crete M.D.   On: 02/28/2015 18:31      Assessment/Plan:  INTERVAL HISTORY:  02/27/15: TEE WITH SMALL VEGETATIONS ON both the mitral valve leaflet and the bioprosthetic aortic valve. BUT NO VEGETATION ON PM  --NOTE I MISREAD THE READ BY DR. Oval Linsey AND MY NOTE WAS INITIALLY NOT ACCURATE RE THIS PT  02/28/15: organims is growing viridans strep S to PCN 03/01/15: panorex revealed area concerning for Likely periapical abscess on the left in the mandible adjacent tothe root of the second bicuspid.  Dr Prescott Gum and now Dr. Lawana Chambers have seen the patient  Principal Problem:   Intractable back pain Active Problems:   Anemia   Chronic a-fib on Coumadin   CAD (coronary artery disease) s/p CABG   HTN (hypertension)   Chronic diastolic heart failure (HCC)   Febrile illness   Lumbar pain   SIRS (systemic inflammatory response syndrome) (HCC)   Pyrexia   Lumbar back pain   Gram-positive bacteremia   Pacemaker infection ([place])   Streptococcus viridans infection   Acute endocarditis   Diskitis   Prosthetic valve endocarditis ([place])   Endocarditis of native valve    Ethan Lawrence is a 81 y.o. 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  His panorex was read as showing possible periapical abscess but Dr. Tommie Raymond does not find evidence for this on his read of panorex or exam   would treat for 8 weeks with IV rocephin because of #2 below  Despite the TEE there remains HIGH anxiety that his device is infected.  Greatly appreciate Dr. Prescott Gum and Dr. Lawana Chambers seeing the patient   #2 Severe lower back pain: CT does show clear-cut discitis but clinically there is a high suspicion for this.  There are no clear-cut targets to have aspirated by interventional radiology and does not have any neurological findings that would make him in need of neurosurgical considerations.  I would treat him for 8 weeks with IV ceftriaxone   #3 Hearing loss: could this be due to vancomycin--stopping abx and seems to be getting better  I spent greater than 40 minutes with the patient including greater than 50% of time in face to face counsel of the patient and daughter re his NVE and PVE and likely diskitis, ? Dental abcess, hearing loss and in coordination of his care.   Diagnosis: Prosthetic and native valve endocarditis   Culture Result: Viridans strep in blood cultures  Allergies  Allergen Reactions  . Tape Other (See Comments)    Pulls skin off  . Vancomycin Other (See Comments)    Hearing loss    Discharge antibiotics: Rocephin 2 grams daily Per pharmacy protocol  Rocephin 2 grams daily Duration:  8 weeks End Date: February 12th, 2016  [facility] Care Per Protocol: Labs weekly while on IV antibiotics: x__ CBC with differential x__ CMP x__ CRP x__ ESR  Fax weekly labs to [phone]  Clinic Follow Up Appt:  End of January in [facility]  I will otherwise sign off and arrange for HSFU  LOS: 3 days   Alcide Evener 03/01/2015, 5:18 PM

## 2015-03-01 NOTE — Consult Note (Signed)
DENTAL CONSULTATION  Date of Consultation:  03/01/2015 Patient Name:   Ethan Lawrence Date of Birth:   [birth year] Medical Record Number: [id number]  VITALS: BP 129/59 mmHg  Pulse 84  Temp(Src) 98.6 F (37 C) (Oral)  Resp 20  Ht  (1.778 m)  Wt 180 lb 6.4 oz (81.829 kg)  BMI 25.88 kg/m2  SpO2 97%  CHIEF COMPLAINT: Patient referred by Dr. Kathlee Nations Trigt for dental consultation.  HPI: Ethan Lawrence is a 81 year-old male recently diagnosed with streptococcal viridans endocarditis. Patient has a history of previous aortic valve replacement with recent diagnosis of prosthetic valve endocarditis. Patient is now seen to evaluate patient for possible dental source of bacterial endocarditis.  The patient currently denies acute toothaches, swellings, or abscesses. The patient was last seen approximately one year ago to have a dental cleaning. Patient cannot remember if he had antibiotic premedication prior to the dental cleaning. Prior to that, the patient had a new upper complete denture fabricated at [facility] [employer] in [place] two years ago. The patient does not have a lower partial denture. Patient indicates the upper denture "fits good". Patient indicates that he usually sees Dr. Birdie Riddle in [place], [place] for his regular dental care.  PROBLEM LIST: Patient Active Problem List   Diagnosis Date Noted  . Streptococcus viridans infection 02/28/2015  . Acute endocarditis 02/28/2015  . Diskitis   . Prosthetic valve endocarditis (HCC)   . Endocarditis of native valve   . Lumbar back pain   . Gram-positive bacteremia   . Pacemaker infection (HCC)   . Febrile illness 02/26/2015  . Intractable back pain 02/26/2015  . Lumbar pain   . SIRS (systemic inflammatory response syndrome) (HCC)   . Pyrexia   . Dyspnea on exertion 02/21/2015  . Mitral regurgitation 02/21/2015  . Bilateral leg pain 11/15/2014  . Pacemaker 09/01/2013  . S/P AVR 03/01/2013  . Chronic  diastolic heart failure (HCC) 03/01/2013  . Long term (current) use of anticoagulants 03/01/2013  . S/P left TKA 02/15/2013  . Abdominal aneurysm without mention of rupture 09/02/2011  . History of AAA (abdominal aortic aneurysm) repair 06/18/2011  . Anemia 06/18/2011  . GI bleed 06/18/2011  . Arthritis 06/18/2011  . Chronic a-fib on Coumadin 06/18/2011  . CAD (coronary artery disease) s/p CABG 06/18/2011  . HTN (hypertension) 06/18/2011    PMH: Past Medical History  Diagnosis Date  . Hypertension   . Hyperlipidemia   . CAD (coronary artery disease)     a. 05/2004 CABGx4 (LIMA->LAD, VG->RI, VG->OM, VG->RCA);  b. 08/2012 Cath: Native 3VD with 3/4 patent grafts (VG->RI occluded).  . Atrial fibrillation (HCC)   . Macular degeneration   . Aortic stenosis     a. 08/2012 TEE: EF 45-55%, critical AS, Triv AI, mild to mod MR, Sev TR;  b. 08/2012 AVR (21mm Magna Ease pericardial tissue valve) & TV Repair (30mm MC3 annuloplasty ring).  Marland Kitchen AAA (abdominal aortic aneurysm) (HCC)     a. 06/2010 s/p Endovascular AAA Repair  . 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 Pain     PT HAS A LOT OF LOWER BACK PAIN- PREVIOUS BACK SURGERY AND DJD  . Pacemaker   . Shortness of breath     MOSTLY WITH EXERTION - IMPROVED SINCE HEART VALVE REPLACEMENT  . Arthritis     LEFT KNEE OA AND PAIN;  DJD LUMBAR  . Anemia requiring transfusions  MOST RECENT WAS JUNE 2014 WITH HEART SURGERY  . Complication of anesthesia     STATES HE DOES WELL WITH DIPROVAN  . Blood transfusion without reported diagnosis   . Cataract   . CHF (congestive heart failure) (HCC)     PSH: Past Surgical History  Procedure Laterality Date  . Endovascular stent insertion  07/05/2010  . Appendectomy    . Coronary artery bypass graft    . Vascular surgery    . Back surgery    . Cardiac catheterization    . Esophagogastroduodenoscopy  06/19/2011    Procedure:  ESOPHAGOGASTRODUODENOSCOPY (EGD);  Surgeon: Graylin Shiver, MD;  Location: [facility] OR;  Service: Gastroenterology;  Laterality: N/A;  . Colonoscopy  06/19/2011    Procedure: COLONOSCOPY;  Surgeon: Graylin Shiver, MD;  Location: [facility] OR;  Service: Gastroenterology;  Laterality: N/A;  . Eye surgery      bilater cataracts removed  . Aortic valve replacement N/A 09/01/2012    Procedure: REDO AORTIC VALVE REPLACEMENT (AVR);  Surgeon: Kerin Perna, MD;  Location: [facility] OR;  Service: Open Heart Surgery;  Laterality: N/A;  . Tricuspid valve replacement N/A 09/01/2012    Procedure: TRICUSPID VALVE REPAIR;  Surgeon: Kerin Perna, MD;  Location: [facility] OR;  Service: Open Heart Surgery;  Laterality: N/A;  . Intraoperative transesophageal echocardiogram N/A 09/01/2012    Procedure: INTRAOPERATIVE TRANSESOPHAGEAL ECHOCARDIOGRAM;  Surgeon: Kerin Perna, MD;  Location: [facility] OR;  Service: Open Heart Surgery;  Laterality: N/A;  . Pacemaker insertion  09/07/2012    Medtronic Sensia single chamber pacemaker implanted by Dr Ladona Ridgel   . Surgery about 50 yrs ago for perforated ulcer and repair of hiatal hernia    . Total knee arthroplasty Left 02/15/2013    Procedure: LEFT TOTAL KNEE ARTHROPLASTY;  Surgeon: Shelda Pal, MD;  Location: [facility] ORS;  Service: Orthopedics;  Laterality: Left;  . Permanent pacemaker insertion N/A 09/07/2012    Procedure: PERMANENT PACEMAKER INSERTION;  Surgeon: Marinus Maw, MD;  Location: [facility] CATH LAB;  Service: Cardiovascular;  Laterality: N/A;  . Hernia repair    . Joint replacement    . Spine surgery    . Cardiac valve replacement    . Prostate surgery    . Tee without cardioversion N/A 02/27/2015    Procedure: TRANSESOPHAGEAL ECHOCARDIOGRAM (TEE);  Surgeon: Chilton Si, MD;  Location: [facility] ENDOSCOPY;  Service: Cardiovascular;  Laterality: N/A;    ALLERGIES: Allergies  Allergen Reactions  . Tape Other (See Comments)    Pulls skin off  . Vancomycin Other (See Comments)    Hearing loss     MEDICATIONS: Current Facility-Administered Medications  Medication Dose Route Frequency Provider Last Rate Last Dose  . 0.9 %  sodium chloride infusion  250 mL Intravenous PRN Ron Parker, MD      . acetaminophen (TYLENOL) tablet 650 mg  650 mg Oral Q6H PRN Ron Parker, MD       Or  . acetaminophen (TYLENOL) suppository 650 mg  650 mg Rectal Q6H PRN Ron Parker, MD      . alum & mag hydroxide-simeth (MAALOX/MYLANTA) 200-200-20 MG/5ML suspension 30 mL  30 mL Oral Q6H PRN Ron Parker, MD      . cefTRIAXone (ROCEPHIN) 2 g in dextrose 5 % 50 mL IVPB  2 g Intravenous Q24H Randall Hiss, MD   2 g at 02/28/15 1313  . clotrimazole (LOTRIMIN) 1 % cream   Topical BID Costin Otelia Sergeant, MD      .  diclofenac (FLECTOR) 1.3 % 1 patch  1 patch Transdermal BID Leatha Gildingostin M Gherghe, MD   1 patch at 03/01/15 0011  . docusate sodium (COLACE) capsule 100 mg  100 mg Oral BID Ron ParkerHarvette C Jenkins, MD   100 mg at 02/28/15 2220  . folic acid (FOLVITE) tablet 1 mg  1 mg Oral Daily Ron ParkerHarvette C Jenkins, MD   1 mg at 02/28/15 [id number]  . gabapentin (NEURONTIN) capsule 300 mg  300 mg Oral BID Ron ParkerHarvette C Jenkins, MD   300 mg at 02/28/15 2101  . HYDROmorphone (DILAUDID) injection 0.5 mg  0.5 mg Intravenous Once PRN Shawn C Joy, PA-C      . HYDROmorphone (DILAUDID) injection 0.5-1 mg  0.5-1 mg Intravenous Q3H PRN Ron ParkerHarvette C Jenkins, MD   1 mg at 03/01/15 0900  . methocarbamol (ROBAXIN) tablet 500 mg  500 mg Oral Q6H PRN Leatha Gildingostin M Gherghe, MD   500 mg at 03/01/15 0408  . multivitamin-lutein (OCUVITE-LUTEIN) capsule 1 capsule  1 capsule Oral BID Leatha Gildingostin M Gherghe, MD   1 capsule at 02/28/15 2301  . ondansetron (ZOFRAN) tablet 4 mg  4 mg Oral Q6H PRN Ron ParkerHarvette C Jenkins, MD       Or  . ondansetron (ZOFRAN) injection 4 mg  4 mg Intravenous Q6H PRN Ron ParkerHarvette C Jenkins, MD      . oxyCODONE (Oxy IR/ROXICODONE) immediate release tablet 5 mg  5 mg Oral Q4H PRN Ron ParkerHarvette C Jenkins, MD   5 mg at 03/01/15 0835  .  oxyCODONE-acetaminophen (PERCOCET) 7.5-325 MG per tablet 1-2 tablet  1-2 tablet Oral Q4H PRN Leatha Gildingostin M Gherghe, MD   2 tablet at 02/28/15 0844  . pantoprazole (PROTONIX) EC tablet 40 mg  40 mg Oral Daily Ron ParkerHarvette C Jenkins, MD   40 mg at 02/28/15 0943  . predniSONE (DELTASONE) tablet 5 mg  5 mg Oral Q breakfast Costin Otelia SergeantM Gherghe, MD   5 mg at 03/01/15 0835  . rOPINIRole (REQUIP) tablet 1 mg  1 mg Oral Daily Ron ParkerHarvette C Jenkins, MD   1 mg at 02/28/15 0943  . simvastatin (ZOCOR) tablet 20 mg  20 mg Oral QHS Ron ParkerHarvette C Jenkins, MD   20 mg at 02/28/15 2101  . sodium chloride 0.9 % injection 3 mL  3 mL Intravenous Q12H Ron ParkerHarvette C Jenkins, MD   3 mL at 02/26/15 1000  . sodium chloride 0.9 % injection 3 mL  3 mL Intravenous PRN Ron ParkerHarvette C Jenkins, MD      . warfarin (COUMADIN) tablet 3 mg  3 mg Oral ONCE-1800 Starleen Armsawood S Elgergawy, MD      . Warfarin - Pharmacist Dosing Inpatient   Does not apply q1800 Silvana Newnessndrew D Meyer, [facility]   Stopped at 02/28/15 2027    LABS: Lab Results  Component Value Date   WBC 6.2 03/01/2015   HGB 9.2* 03/01/2015   HCT 29.4* 03/01/2015   MCV 93.6 03/01/2015   PLT 150 03/01/2015      Component Value Date/Time   NA 139 03/01/2015 0614   K 4.4 03/01/2015 0614   CL 106 03/01/2015 0614   CO2 24 03/01/2015 0614   GLUCOSE 98 03/01/2015 0614   BUN 18 03/01/2015 0614   CREATININE 1.48* 03/01/2015 0614   CREATININE 1.76* 02/21/2015 1615   CALCIUM 9.0 03/01/2015 0614   GFRNONAA 40* 03/01/2015 0614   GFRAA 46* 03/01/2015 0614   Lab Results  Component Value Date   INR 2.29* 03/01/2015   INR 2.75* 02/28/2015   INR 2.12*  02/27/2015   No results found for: PTT  SOCIAL HISTORY: Social History   Social History  . Marital Status: Married    Spouse Name: N/A  . Number of Children: 2  . Years of Education: N/A   Occupational History  . Not on file.   Social History Main Topics  . Smoking status: Former Smoker    Quit date: 10/29/1975  . Smokeless tobacco: Never Used  .  Alcohol Use: No  . Drug Use: No  . Sexual Activity: Not on file   Other Topics Concern  . Not on file   Social History Narrative   03/01/15:    Pt. lives in [place]. [name] wife recently passed away 2 months ago.   Patient had a son and daughter. Son has passed away.    Patient has a daughter that lives in [place]. 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      FAMILY HISTORY: Family History  Problem Relation Age of Onset  . Coronary artery disease Brother   . Healthy Mother   . Emphysema Father   . Healthy Sister   . Alzheimer's disease Brother     REVIEW OF SYSTEMS: ROS from Dr. Mort Sawyers and P note of 02/26/15 was evaluated and reviewed with the patient today.   Constitutional: No Weight Loss, No Weight Gain, Night Sweats, Fevers, Chills, Dizziness, Light Headedness, + Fatigue, or Generalized Weakness HEENT: No Headaches, Difficulty Swallowing, Patient denies having tooth pain or abscesses or swellings, patient has upper complete denture and no lower partial denture; no Sore Throat, No Sneezing, Rhinitis, Ear Ache, Nasal Congestion, or Post Nasal Drip,  Cardio-vascular: No Chest pain, Orthopnea, PND, Edema in Lower Extremities, Anasarca, Dizziness, Palpitations  Resp: No Dyspnea, No DOE, No Productive Cough, No Non-Productive Cough, No Hemoptysis, No Wheezing.  GI: No Heartburn, Indigestion, Abdominal Pain, Nausea, Vomiting, Diarrhea, Constipation, Hematemesis, Hematochezia, Melena, Change in Bowel Habits, Loss of Appetite  GU: No Dysuria, No Change in Color of Urine, No Urgency or Urinary Frequency, No Flank pain.  Musculoskeletal: No Joint Pain or Swelling, No Decreased Range of Motion, +Back Pain.  Neurologic: No Syncope, No Seizures, Muscle Weakness, Paresthesia, Vision Disturbance or Loss, No Diplopia, No Vertigo, No Difficulty Walking,  Skin: No Rash or Lesions. Psych: No Change in  Mood or Affect, No Depression or Anxiety, No Memory loss, No Confusion, or Hallucinations   DENTAL HISTORY: CHIEF COMPLAINT: Patient referred by Dr. Kathlee Nations Trigt for dental consultation.  HPI: MIQUAN TANDON is a 81 year-old male recently diagnosed with streptococcal viridans endocarditis. Patient has a history of previous aortic valve replacement with recent diagnosis of prosthetic valve endocarditis. Patient is now seen to evaluate patient for possible dental source of bacterial endocarditis.  The patient currently denies acute toothaches, swellings, or abscesses. The patient was last seen approximately one year ago to have a dental cleaning. Patient cannot remember if he had antibiotic premedication prior to the dental cleaning. Prior to that the patient had a new upper complete denture fabricated at [facility] [employer] in [place] years ago. The patient does not have a lower partial denture. Patient indicates the upper denture "fits good". Patient indicates that he usually sees Dr. Birdie Riddle in [place], [place] for his regular dental care.  DENTAL EXAMINATION: GENERAL: The patient is well-developed, well-nourished male in no acute distress. HEAD AND NECK: There is no palpable submandibular lymphadenopathy. The patient denies acute TMJ symptoms. Patient has  normal maximum interincisal opening. INTRAORAL EXAM: Patient has normal saliva. There is no evidence of intraoral abscess formation. Patient has flabby tissues associated with the premaxilla. DENTITION: The maxilla is edentulous. Patient is also missing tooth numbers 17, 18, 19, 20, 30, 31, and 32. PERIODONTAL: Patient has chronic periodontitis with plaque and calculus complications, gingival recession, and mandibular anterior incipient tooth mobility measured as plus for tooth numbers 23, 24, 25, and 26. There is incipient to moderate bone loss noted. DENTAL CARIES/SUBOPTIMAL RESTORATIONS: No obvious dental caries are  noted. ENDODONTIC: The patient currently denies acute pulpitis symptoms. I do not see any evidence of periapical pathology or radiolucency. CROWN AND BRIDGE: Patient has crowns on tooth numbers 21, 22, 27, 28, 29 that appear to be clinically acceptable. PROSTHODONTIC: Patient has an upper complete denture fabricated approximately 2 years ago that is clinically acceptable in regards to retention and stability. OCCLUSION: There is a poor occlusal scheme of the existing upper denture opposing lower natural dentition. This occlusion is stable, however.  RADIOGRAPHIC INTERPRETATION: An orthopantogram was taken on 02/28/2015. Patient is missing tooth numbers 1 through 16, 17 through 20, and 30 through 32. There is atrophy of the edentulous alveolar ridges noted. There is NO obvious periapical pathology or radiolucency per my review of the radiograph. The radiolucency identified in the radiology report appears to be pointing to the mental nerve foramen and does not appear to represent a periapical radiolucency. Multiple crown restorations on tooth numbers 21, 22, 27, 28, and 29 are noted.   ASSESSMENTS: 1. Bacterial endocarditis with streptococcal viridans 2. Prosthetic valve endocarditis 3. Chronic anticoagulation 4. Chronic periodontitis with bone loss 5. Gingival recession 6. Plaque and calculus accumulations 7. Incipient mandibular anterior tooth mobility 8. Multiple missing teeth 9. Upper complete denture and no lower partial denture 10. Poor occlusal scheme but a stable occlusion 11. No obvious periapical pathology or radiolucency noted per my review of the orthopantogram 12. Need for antibiotic premedication prior to invasive dental procedures per American Heart Association guidelines and history of endocarditis and heart valve replacement 13. Risk for bleeding with invasive dental procedures due to history of chronic anticoagulation.  PLAN/RECOMMENDATIONS: 1. I discussed the risks,  benefits, and complications of various treatment options with the patient in relationship to his medical and dental conditions, bacterial endocarditis, anticoagulation, and prosthetic valve endocarditis. We discussed various treatment options to include no treatment, multiple extractions with alveoloplasty, periodontal therapy, dental restorations, crown and bridge therapy, implant therapy, and replacement of missing teeth as indicated. The patient currently wishes to follow-up with his primary dentist in [place] to obtain additional periapical radiographs to rule out mandibular periapical pathology and a dental cleaning.  Patient understands that he will need antibiotic premedication prior to invasive dental procedures unless he is on current IV antibiotic therapy for his endocarditis. Patient wishes to have this information sent to Dr. Birdie Riddle in [place], [place].   2. Discussion of findings with medical team and coordination of future medical and dental care as needed.    Charlynne Pander, DDS

## 2015-03-02 LAB — CBC
HEMATOCRIT: 31.4 % — AB (ref 39.0–52.0)
Hemoglobin: 10 g/dL — ABNORMAL LOW (ref 13.0–17.0)
MCH: 29.3 pg (ref 26.0–34.0)
MCHC: 31.8 g/dL (ref 30.0–36.0)
MCV: 92.1 fL (ref 78.0–100.0)
Platelets: 186 10*3/uL (ref 150–400)
RBC: 3.41 MIL/uL — ABNORMAL LOW (ref 4.22–5.81)
RDW: 13.4 % (ref 11.5–15.5)
WBC: 6.5 10*3/uL (ref 4.0–10.5)

## 2015-03-02 LAB — PROTIME-INR
INR: 2.6 — AB (ref 0.00–1.49)
Prothrombin Time: 27.5 seconds — ABNORMAL HIGH (ref 11.6–15.2)

## 2015-03-02 MED ORDER — WARFARIN SODIUM 2 MG PO TABS
2.0000 mg | ORAL_TABLET | Freq: Once | ORAL | Status: AC
  Administered 2015-03-02: 2 mg via ORAL
  Filled 2015-03-02 (×2): qty 1

## 2015-03-02 MED ORDER — WARFARIN SODIUM 3 MG PO TABS
3.0000 mg | ORAL_TABLET | Freq: Once | ORAL | Status: DC
  Filled 2015-03-02: qty 1

## 2015-03-02 NOTE — Progress Notes (Signed)
ANTICOAGULATION CONSULT NOTE - Follow Up Consult  Pharmacy Consult for coumadin Indication: atrial fibrillation  Allergies  Allergen Reactions  . Tape Other (See Comments)    Pulls skin off  . Vancomycin Other (See Comments)    Hearing loss    Patient Measurements: Height: 5\' 10"  (177.8 cm) Weight: 180 lb 6.4 oz (81.829 kg) IBW/kg (Calculated) : 73   Vital Signs: Temp: 98 F (36.7 C) (12/22 0942) Temp Source: Oral (12/22 0942) BP: 149/96 mmHg (12/22 0942) Pulse Rate: 106 (12/22 0942)  Labs:  Recent Labs  02/27/15 1049 02/28/15 0743 03/01/15 0614 03/02/15 0332  HGB 9.1*  --  9.2*  --   HCT 28.9*  --  29.4*  --   PLT 120*  --  150  --   LABPROT  --  28.7* 25.0* 27.5*  INR  --  2.75* 2.29* 2.60*  CREATININE  --   --  1.48*  --     Estimated Creatinine Clearance: 34.3 mL/min (by C-G formula based on Cr of 1.48).    Assessment: 81 y.o. F presents with severe back pain. She is on coumadin PTA for afib and pharmacy consulted to dose.  Home dose: 6mg  daily except 3 mg on Tues and Sat. Missed dose on 12/17.    Patient received 6mg  dose on 12/18 but not on 12/19. INR jumped from 2.1 to 2.75 so 12/20 dose held. INR today 2.6 from 2.29 - H/H stable, no bleeding noted.  Goal of Therapy:  INR 2-3 Monitor platelets by anticoagulation protocol: Yes   Plan:  -Coumadin 2 mg*1 tonight  -Daily INR for now -Q72h CBC    Sherron MondayAubrey N. Keona Bilyeu, PharmD Clinical Pharmacy Resident Pager: [phone] 03/02/2015 9:48 AM

## 2015-03-02 NOTE — Progress Notes (Signed)
3 Days Post-Op Procedure(s) (LRB): TRANSESOPHAGEAL ECHOCARDIOGRAM (TEE) (N/A) Subjective: Patient examined and echocardiograms reviewed independenly Step viridans bacteremia with small vegetation of prosthetic AVR- valve is functioning normally and no evidence of annular involvement Good LV function Patient is s/p redo AVR and replacement of ascending fusiform aneurysm 2014 -  At age 81 he is not a candiadate for a 3rd sternotomy. His endocarditis should respond to antibiotics as directed by ID. He should have a repeat echocardiogram by his cardiologist in 6 weeks to assess response to antibiotics, or sooner if he has new signs of heart failure or AI.  Objective: Vital signs in last 24 hours: Temp:  [98 F (36.7 C)-99 F (37.2 C)] 98 F (36.7 C) (12/22 0942) Pulse Rate:  [83-106] 106 (12/22 0942) Cardiac Rhythm:  [-]  Resp:  [16-20] 20 (12/22 0942) BP: (106-160)/(54-96) 149/96 mmHg (12/22 0942) SpO2:  [90 %-100 %] 90 % (12/22 0942)  Hemodynamic parameters for last 24 hours:  febrile, atrial fibrillation  Intake/Output from previous day: 12/21 0701 - 12/22 0700 In: 340 [P.O.:340] Out: 1925 [Urine:1925] Intake/Output this shift: Total I/O In: -  Out: 200 [Urine:200]      Physical Exam  General: elderly male OOB in chair c/o mid back pain HEENT: Normocephalic pupils equal , dentition adequate Neck: Supple without JVD, adenopathy, or bruit Chest: Clear to auscultation, symmetrical breath sounds, no rhonchi, no tenderness             or deformity- well healed sternotomy Cardiovascular: Regular rate and rhythm, 2/6 systolic murmur of MR, no gallop, peripheral pulses             palpable in all extremities Abdomen:  Soft, nontender, no palpable mass or organomegaly Extremities: Warm, well-perfused, no clubbing cyanosis edema or tenderness,              no venous stasis changes of the legs Rectal/GU: Deferred Neuro: Grossly non--focal and symmetrical throughout Skin: Clean and  dry without rash or ulceration   Lab Results:  Recent Labs  03/01/15 0614 03/02/15 0922  WBC 6.2 6.5  HGB 9.2* 10.0*  HCT 29.4* 31.4*  PLT 150 186   BMET:  Recent Labs  03/01/15 0614  NA 139  K 4.4  CL 106  CO2 24  GLUCOSE 98  BUN 18  CREATININE 1.48*  CALCIUM 9.0    PT/INR:  Recent Labs  03/02/15 0332  LABPROT 27.5*  INR 2.60*   ABG    Component Value Date/Time   PHART 7.424 09/03/2012 0420   HCO3 21.6 09/03/2012 0420   TCO2 23 09/03/2012 0420   ACIDBASEDEF 2.0 09/03/2012 0420   O2SAT 92.0 09/03/2012 0420   CBG (last 3)  No results for input(s): GLUCAP in the last 72 hours.  Assessment/Plan: S/P Procedure(s) (LRB): TRANSESOPHAGEAL ECHOCARDIOGRAM (TEE) (N/A) Strep viridans bacteremia with mild prosthetic aortic valve endocarditis Should resolve with antibiotics Not a surgical candidate for 3rd sternotomy at age 81   LOS: 4 days    Kathlee Nationseter Van Trigt III 03/02/2015

## 2015-03-02 NOTE — Progress Notes (Signed)
Physical Therapy Treatment Patient Details Name: Ethan Lawrence MRN: [id number] DOB: 1923/03/11 Today's Date: 03/02/2015    History of Present Illness Ethan Lawrence is a 81 y.o. male with a history of CAD, CHF,Chronic Atrial Fib on Coumadin Rx, AV Block S/P Pacemaker, AS, AAA S/P Repair, HTN who presents to the ED with complaints of sudden onset of Intractable 10/10 Spasmodic low back pain for several days. 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    PT Comments    Pt making slow progress due to severity of pain. At this level of pain may need more help than is available at home.  Follow Up Recommendations  SNF (unless pain resolves more quickly)     Equipment Recommendations  Rolling walker with 5" wheels    Recommendations for Other Services       Precautions / Restrictions Precautions Precautions: Fall Precaution Comments: severe back pain Restrictions Weight Bearing Restrictions: No    Mobility  Bed Mobility Overal bed mobility: Needs Assistance Bed Mobility: Rolling;Sidelying to Sit Rolling: Min assist Sidelying to sit: Mod assist       General bed mobility comments: Assist to elevate trunk into sitting  Transfers Overall transfer level: Needs assistance Equipment used: Rolling walker (2 wheeled) Transfers: Sit to/from Stand Sit to Stand: Min assist         General transfer comment: verbal cues for hand placement and assist to bring hips up.  Ambulation/Gait Ambulation/Gait assistance: Min assist;Mod assist Ambulation Distance (Feet): 40 Feet (40' x 1, 20' x 1) Assistive device: Rolling walker (2 wheeled) Gait Pattern/deviations: Step-to pattern;Decreased step length - right;Decreased step length - left;Shuffle;Trunk flexed Gait velocity: decr Gait velocity interpretation: <1.8 ft/sec, indicative of risk for recurrent falls General Gait Details: Pt with incr flexion of hips and knees as distance increased.   Stairs            Wheelchair Mobility    Modified Rankin (Stroke Patients Only)       Balance Overall balance assessment: Needs assistance Sitting-balance support: No upper extremity supported;Feet supported Sitting balance-Leahy Scale: Fair     Standing balance support: Bilateral upper extremity supported Standing balance-Leahy Scale: Poor Standing balance comment: support of walker and min A for static standing                    Cognition Arousal/Alertness: Awake/alert Behavior During Therapy: WFL for tasks assessed/performed Overall Cognitive Status: Within Functional Limits for tasks assessed                      Exercises      General Comments        Pertinent Vitals/Pain Pain Assessment: 0-10 Pain Score: 10-Worst pain ever Pain Location: low back Pain Descriptors / Indicators: Sharp Pain Intervention(s): Limited activity within patient's tolerance;Monitored during session;Repositioned    Home Living                      Prior Function            PT Goals (current goals can now be found in the care plan section) Progress towards PT goals: Progressing toward goals    Frequency  Min 3X/week    PT Plan Discharge plan needs to be updated    Co-evaluation             End of Session   Activity Tolerance: Patient limited by pain Patient left: with call bell/phone within reach;in chair;with  chair alarm set     Time: 1140-1154 PT Time Calculation (min) (ACUTE ONLY): 14 min  Charges:  $Gait Training: 8-22 mins                    G Codes:      Lamya Lausch 03/02/2015, 12:09 PM [employer] CorporationCary Sanvika Cuttino PT [phone]

## 2015-03-02 NOTE — Progress Notes (Signed)
Patient Demographics  Ethan Lawrence, is a 81 y.o. male, DOB - 1923/02/10, ZOX:[id number]  Admit date - 02/25/2015   Admitting Physician Ron Parker, MD  Outpatient Primary MD for the patient is PROVIDER NOT IN SYSTEM  LOS - 4   Chief Complaint  Patient presents with  . Back Pain       Admission HPI/Brief narrative: 81 yo very functional male history of CAD, CHF,Chronic Atrial Fib on Coumadin Rx, AV Block S/P Pacemaker, AS, AAA S/P Repair, HTN, admitted with back pain progressive over the past 2-3 days and febrile illness for a week. He has been running a fever nightly for the past week, found to have fever of 103 on admission. [name] blood cultures are growing streptococcus beta evidence and ID was consulted. He underwent a TEE which showed  endocarditis. He has a very tender right sided inguinal hernia and surgery evaluated without need for acute interventions,  complaining of lower back pain , possible discitis.  Subjective:   Ethan Lawrence today has, No headache, No chest pain, No abdominal pain - No Nausea,  complaining of lower back pain, no further constipation.  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 - Blood culture growing Streptococcus viridans - Sepsis  secondary to strep viridans bacteremia/endocarditis, questionable discitis, unable to obtain MRI lumbar spine secondary to pacemaker, unable to obtain CT lumbar spine with contrast secondary to chronic kidney disease. - Currently afebrile, no leukocytosis - Physiology of sepsis resolved  Strep Viridans bacteremia/ Endocarditis  - TEE on 12/20 showing a 8 mm x 4 mm mobile target on  mitral valve and  prosthetic aortic valve sewing ring. No vegetations were noted on the pacer lead. - ID input greatly appreciated, plan to continue with IV antibiotics for 8 weeks. - IV antibiotic has been narrowed to Rocephin 2 g IV daily. - Discussed with CT surgery Dr. Morton Peters, currently patient is not a surgical candidate - Discussed with Dr. Kristin Bruins regarding finding of possible periapical abscess on panoramic view, he does not find clinical evidence for this on his reading and evaluation. - We'll request PICC line insertion  New onset severe lower back pain - CT lumbar spine without contrast with no evidence of discitis, but high clinical suspicion, a shunt will be treated with 8 weeks of IV antibiotics for his endocarditis which should treat this as well. - continue with pain medicine as needed - PT following   Inguinal hernia - general surgery consulted given possible right inguinal hernia and tenderness in that area, defer any intervention for now   New onset severe  back pain - ? Infectious etiology - Pacemaker none compatible with MRI, CT lumbar spine with IV contrast can be obtained due to CKD. - No evidence of discitis on CT lumbar spine without contrast, but low yield given his without contrast  Anemia - chronic, stable  Chronic A fib -On warfarin, will hold for possible need for dental procedure  CAD s/p CABG - stable, no chest pain  AV block s/p pacemaker  HTN - continue home medications  Acute on chronic diastolic heart failure - some vascular congestion on CXR, on room air  comfortable in the room , will resume Lasix given worsening lower extremity edema.   CKD stage III - Cr close to baseline, received IV contrast, closely monitor - Cr stable   Thrombocytopenia - chronic, stable  Code Status: Full  Family Communication: D/W daughter over phone.  Disposition Plan: Pending further workup   Procedures    TEE Impressions: - There is a 8 mm x 4 mm mobile target on the atrial side of themitral valve at the region of the P3 segment that is concerning for endocarditis given the clinical scenario. Cannot rule outruptured cord. There is no associated valve destruction and themitral regurgitation appears to be separate from the suspectedlesion. There is also a mobile target on the superior surface of the prosthetic aortic valve sewing ring. The aortic valve gradients are moderately elevated, consistent with moderate steonis, asseen on transthoracic echo. The valve is well-seated and there isno rocking motion. There is no aortic reguritation.No vegetations were noted on the pacer lead.   Consults   Infectious disease Gen. surgery Cardiology via phone CT surgery Dr. Morton Peters via phone   Medications  Scheduled Meds: . cefTRIAXone (ROCEPHIN)  IV  2 g Intravenous Q24H  . clotrimazole   Topical BID  . diclofenac  1 patch Transdermal BID  . docusate sodium  100 mg Oral BID  . folic acid  1 mg Oral Daily  . furosemide  20 mg Oral Daily  . gabapentin  300 mg Oral BID  . multivitamin-lutein  1 capsule Oral BID  . pantoprazole  40 mg Oral Daily  . polyethylene glycol  17 g Oral Daily  . predniSONE  5 mg Oral Q breakfast  . rOPINIRole  1 mg Oral Daily  . simvastatin  20 mg Oral QHS  . sodium chloride  3 mL Intravenous Q12H  . warfarin  2 mg Oral ONCE-1800  . Warfarin - Pharmacist Dosing Inpatient   Does not apply q1800   Continuous Infusions:  PRN Meds:.sodium chloride, acetaminophen **OR** acetaminophen, alum & mag hydroxide-simeth,  HYDROmorphone (DILAUDID) injection, HYDROmorphone (DILAUDID) injection, methocarbamol, ondansetron **OR** ondansetron (ZOFRAN) IV, oxyCODONE, sodium chloride  DVT Prophylaxis  warfarin  Lab Results  Component Value Date   PLT 186 03/02/2015    Antibiotics    Anti-infectives    Start     Dose/Rate Route Frequency Ordered Stop   02/28/15 1200  cefTRIAXone (ROCEPHIN) 2 g in dextrose 5 % 50 mL IVPB     2 g 100 mL/hr over 30 Minutes Intravenous Every 24 hours 02/28/15 1055     02/27/15 2000  Ampicillin-Sulbactam (UNASYN) 3 g in sodium chloride 0.9 % 100 mL IVPB  Status:  Discontinued     3 g 100 mL/hr over 60 Minutes Intravenous Every 12 hours 02/27/15 1931 02/28/15 1054   02/27/15 0600  vancomycin (VANCOCIN) IVPB 1000 mg/200 mL premix  Status:  Discontinued     1,000 mg 200 mL/hr over  60 Minutes Intravenous Every 24 hours 02/26/15 0706 02/28/15 1054   02/26/15 1500  piperacillin-tazobactam (ZOSYN) IVPB 3.375 g  Status:  Discontinued     3.375 g 12.5 mL/hr over 240 Minutes Intravenous 3 times per day 02/26/15 0706 02/27/15 1859   02/26/15 0730  piperacillin-tazobactam (ZOSYN) IVPB 3.375 g     3.375 g 100 mL/hr over 30 Minutes Intravenous STAT 02/26/15 0706 02/26/15 1105   02/26/15 0530  vancomycin (VANCOCIN) 1,250 mg in sodium chloride 0.9 % 250 mL IVPB     1,250 mg 166.7 mL/hr over 90 Minutes Intravenous  Once 02/26/15 0517 02/26/15 0712   02/25/15 2045  cefTRIAXone (ROCEPHIN) 2 g in dextrose 5 % 50 mL IVPB     2 g 100 mL/hr over 30 Minutes Intravenous  Once 02/25/15 2040 02/25/15 2244          Objective:   Filed Vitals:   03/01/15 2154 03/02/15 0209 03/02/15 0632 03/02/15 0942  BP: 145/84 160/73 148/54 149/96  Pulse: 83 94 100 106  Temp: 98.9 F (37.2 C) 98.2 F (36.8 C) 98.4 F (36.9 C) 98 F (36.7 C)  TempSrc: Oral Oral Oral Oral  Resp: 20 16 18 20   Height:      Weight:      SpO2: 98% 99% 100% 90%    Wt Readings from Last 3 Encounters:  02/26/15 81.829 kg  (180 lb 6.4 oz)  02/23/15 77.656 kg (171 lb 3.2 oz)  02/21/15 78.291 kg (172 lb 9.6 oz)     Intake/Output Summary (Last 24 hours) at 03/02/15 1103 Last data filed at 03/02/15 0932  Gross per 24 hour  Intake    100 ml  Output   1550 ml  Net  -1450 ml     Physical Exam  Awake Alert, Oriented X 3,  [place].AT,PERRAL Supple Neck,No JVD,  Symmetrical Chest wall movement, Good air movement bilaterally, CTAB irregular, murmur +, No Gallops,Rubs , No Parasternal Heave +ve B.Sounds, Abd Soft, No tenderness, No organomegaly appriciated, No rebound - guarding or rigidity. No Cyanosis, Clubbing , No new Rash or bruise     Data Review   Micro Results Recent Results (from the past 240 hour(s))  Blood Culture (routine x 2)     Status: None   Collection Time: 02/25/15  8:45 PM  Result Value Ref Range Status   Specimen Description BLOOD RIGHT FOREARM  Final   Special Requests BOTTLES DRAWN AEROBIC AND ANAEROBIC 5C  Final   Culture  Setup Time   Final    GRAM POSITIVE COCCI IN PAIRS CRITICAL RESULT CALLED TO, READ BACK BY AND VERIFIED WITH: K BREINICH 02/26/15 @ 1209 M VESTAL IN BOTH AEROBIC AND ANAEROBIC BOTTLES    Culture   Final    VIRIDANS STREPTOCOCCUS Performed at [facility]    Report Status 02/28/2015 FINAL  Final   Organism ID, Bacteria VIRIDANS STREPTOCOCCUS  Final      Susceptibility   Viridans streptococcus - MIC*    ERYTHROMYCIN <=0.12 SENSITIVE Sensitive     TETRACYCLINE >=16 RESISTANT Resistant     VANCOMYCIN 0.5 SENSITIVE Sensitive     CLINDAMYCIN <=0.25 SENSITIVE Sensitive     PENICILLIN <=0.06 SENSITIVE Sensitive     CEFTRIAXONE <=0.12 SENSITIVE Sensitive     LEVOFLOXACIN 2 SENSITIVE Sensitive     * VIRIDANS STREPTOCOCCUS  Blood Culture (routine x 2)     Status: None   Collection Time: 02/25/15  9:15 PM  Result Value Ref Range Status  Specimen Description BLOOD LEFT FOREARM  Final   Special Requests BOTTLES DRAWN AEROBIC AND ANAEROBIC 5CC  Final    Culture  Setup Time   Final    GRAM POSITIVE COCCI IN PAIRS IN BOTH AEROBIC AND ANAEROBIC BOTTLES CRITICAL RESULT CALLED TO, READ BACK BY AND VERIFIED WITH: K BREINICH 02/26/15 @ 1209 M VESTAL    Culture   Final    VIRIDANS STREPTOCOCCUS SUSCEPTIBILITIES PERFORMED ON PREVIOUS CULTURE WITHIN THE LAST 5 DAYS. [facility]    Report Status 02/28/2015 FINAL  Final  Urine culture     Status: None   Collection Time: 02/25/15 10:59 PM  Result Value Ref Range Status   Specimen Description URINE, CLEAN CATCH  Final   Special Requests NONE  Final   Culture   Final    8,000 COLONIES/mL INSIGNIFICANT GROWTH [facility]    Report Status 02/27/2015 FINAL  Final  Culture, blood (Routine X 2) w Reflex to ID Panel     Status: None (Preliminary result)   Collection Time: 02/27/15  6:45 PM  Result Value Ref Range Status   Specimen Description BLOOD LEFT ARM  Final   Special Requests BOTTLES DRAWN AEROBIC AND ANAEROBIC 5CC  Final   Culture NO GROWTH 2 DAYS  Final   Report Status PENDING  Incomplete  Culture, blood (Routine X 2) w Reflex to ID Panel     Status: None (Preliminary result)   Collection Time: 02/27/15  6:50 PM  Result Value Ref Range Status   Specimen Description BLOOD LEFT HAND  Final   Special Requests IN PEDIATRIC BOTTLE 1CC  Final   Culture NO GROWTH 2 DAYS  Final   Report Status PENDING  Incomplete    Radiology Reports Dg Orthopantogram  02/28/2015  CLINICAL DATA:  Jaw pain EXAM: ORTHOPANTOGRAM/PANORAMIC COMPARISON:  None. FINDINGS: The patient is predominantly edentulous. On the left adjacent to the root of the second mandibular bicuspid there is a circular lucency identified suggestive of periapical abscess. IMPRESSION: Likely periapical abscess on the left in the mandible adjacent to the root of the second bicuspid. Electronically Signed   By: Alcide CleverMark  Lukens M.D.   On: 02/28/2015 14:13   Dg Chest 1 View  02/25/2015  CLINICAL DATA:   81 year old male with back pain for 6 hours. EXAM: CHEST 1 VIEW COMPARISON:  Chest radiograph 01/20/2013. Included lung bases from CT abdomen/pelvis performed concurrently. FINDINGS: Single lead left-sided pacemaker remains in place. Patient is post median sternotomy with 2 prosthetic valves. Multi chamber cardiomegaly, progressed from prior exam. There is tortuosity and atherosclerosis of the thoracic aorta. Vascular congestion with possible mild perihilar edema. Calcified granuloma in the right upper lung. Small pleural effusions on CT are not seen radiographically. No confluent airspace disease. No pneumothorax. IMPRESSION: Cardiomegaly with vascular congestion and questionable perihilar edema. Recommend correlation for CHF. Electronically Signed   By: Rubye OaksMelanie  Ehinger M.D.   On: 02/25/2015 22:30   Dg Thoracic Spine 2 View  02/25/2015  CLINICAL DATA:  Lower back pain since 4:30 this afternoon. History of chronic back pain. No injury noted. EXAM: THORACIC SPINE 2 VIEWS COMPARISON:  Chest x-ray dated 01/20/2013. FINDINGS: Portions of the thoracic spine are incompletely seen due to overlying osseous and soft tissue structures. Overall osseous alignment appears normal and grossly stable compared to a previous chest x-ray of 01/20/2013. No osseous fracture or dislocation seen. No acute - appearing cortical irregularity or osseous lesion. Paravertebral soft tissues are unremarkable for acute  process. IMPRESSION: No acute findings. Electronically Signed   By: Bary Richard M.D.   On: 02/25/2015 22:29   Dg Lumbar Spine Complete  02/25/2015  CLINICAL DATA:  81 year old male with lumbosacral back pain for 6 hours. EXAM: LUMBAR SPINE - COMPLETE 4+ VIEW COMPARISON:  Reformats from CT abdomen/ pelvis 09/02/2011. FINDINGS: Kyphoplasty within L4 compression deformity. Remaining vertebral body heights are maintained. Mild levoscoliosis centered at L2-L3 with associated degenerative disc disease. Additional disc space  narrowing at L1-L2. The bones are under mineralized. Aorto bi-iliac stent in place. IMPRESSION: 1. Kyphoplasty within L4 compression deformity. 2. Scoliosis and degenerative change.  No acute bony abnormality. Electronically Signed   By: Rubye Oaks M.D.   On: 02/25/2015 22:26   Ct Lumbar Spine Wo Contrast  02/28/2015  CLINICAL DATA:  Several day history of low back pain. EXAM: CT LUMBAR SPINE WITHOUT CONTRAST TECHNIQUE: Multidetector CT imaging of the lumbar spine was performed without intravenous contrast administration. Multiplanar CT image reconstructions were also generated. COMPARISON:  CT scan 02/25/2015 FINDINGS: Remote vertebral augmentation changes at L4 a with a significant compression deformity. Mild stable retropulsion and mild canal encroachment. New line the other lumbar vertebral bodies are maintained. No acute fractures identified. S1 is not covered on this examination. Stable advanced degenerative disc disease at L3-4 along with advanced facet disease at L3-4 and L4-5. There are moderate-sized bilateral pleural effusions noted along with overlying basilar atelectasis. Stable aortoiliac stent graft. No complicating features are demonstrated. L1-2: Broad-based bulging annulus and osteophytic ridging with mild left lateral recess and left foraminal encroachment. This could irritate the left L1 nerve root. L2-3: Advanced degenerative disc disease and facet disease with a bulging annulus and osteophytic ridging. Mild flattening of the ventral thecal sac but no significant spinal or foraminal stenosis. L3-4: Wide decompressive laminectomy and vertebral augmentation changes. No significant spinal stenosis. Right-sided disc osteophyte complex with significant right foraminal stenosis likely effecting the right L3 nerve root. L4-5: Bulging annulus, osteophytic ridging and facet disease with mild spinal and bilateral lateral recess stenosis. No significant foraminal stenosis. L5-S1:  Bulging annulus  but no spinal or foraminal stenosis. IMPRESSION: 1. Remote L4 compression fracture with vertebral augmentation changes. 2. No new/acute fractures are identified. 3. Mild left lateral recess and left foraminal stenosis at L1-2. 4. Right foraminal/extra foraminal disc osteophyte complex on the right at L3-4 likely effecting the right L3 nerve root. 5. Mild spinal and bilateral lateral recess stenosis at L4-5. 6. Bilateral moderate-sized pleural effusions. Electronically Signed   By: Rudie Meyer M.D.   On: 02/28/2015 18:40   Ct Abdomen Pelvis W Contrast  02/25/2015  CLINICAL DATA:  Severe low back pain with any movement. History of chronic back pain. Abdominal pain and tenderness. EXAM: CT ABDOMEN AND PELVIS WITH CONTRAST TECHNIQUE: Multidetector CT imaging of the abdomen and pelvis was performed using the standard protocol following bolus administration of intravenous contrast. CONTRAST:  70mL OMNIPAQUE IOHEXOL 300 MG/ML  SOLN COMPARISON:  09/02/2011 FINDINGS: Small bilateral pleural effusions. Multiple surgical clips at the EG junction. Postoperative changes in the mediastinum. Surgical absence of the gallbladder. Calcified granulomas scattered throughout the liver and spleen. No hepatosplenomegaly. The pancreas, adrenal glands, inferior vena cava, and retroperitoneal lymph nodes are unremarkable. Abdominal aortic aneurysm post aortoiliac stent grafts. Native sac diameter is 5.6 cm. This measurement is similar to the previous study. Kidneys are atrophic bilaterally. Renal nephrograms appear symmetrical. No hydronephrosis. Large cysts in both kidneys, largest is off of the lower pole of the  left kidney and measures about 8.9 cm diameter. This cyst is increased in size since the previous study. The stomach, small bowel, and colon are not abnormally distended. The colon is diffusely stool-filled. No free air or free fluid in the abdomen. Surgical clips in the anterior abdominal wall. Pelvis: Prostate gland is not  enlarged. Prostate defect may indicate previous TUR procedure. Bladder wall is not thickened. Appendix is surgically absent. No free or loculated pelvic fluid collections. No pelvic mass or lymphadenopathy. Vascular calcifications in the pelvis. Fat in the inguinal canals. Degenerative changes in the lumbar spine. L4 compression deformity post kyphoplasty. No destructive bone lesions. IMPRESSION: No significant acute process demonstrated in the abdomen or pelvis. There are small bilateral pleural effusions. Postoperative changes demonstrated throughout the chest, abdomen, and pelvis. Abdominal aortic aneurysm post stent graft. Native sac diameter is 5.6 cm without change since prior study. Multiple renal cysts with enlarging large cyst on the left kidney. Electronically Signed   By: Burman Nieves M.D.   On: 02/25/2015 22:42   Ct Maxillofacial Ltd Wo Cm  02/28/2015  CLINICAL DATA:  81 year old male with sinusitis and bacteremia. EXAM: CT PARANASAL SINUS LIMITED WITHOUT CONTRAST TECHNIQUE: Non-contiguous multidetector CT images of the paranasal sinuses were obtained in a single plane without contrast. COMPARISON:  None. FINDINGS: There is no acute fracture of the visualized facial bones. Bilateral cataract surgeries noted. The globes are otherwise intact. The retro-orbital fat and orbital walls are preserved. The paranasal sinuses are well aerated. No significant mucoperiosteal thickening. No air-fluid level. No soft tissue swelling noted. No acute hemorrhage identified within the visualized brain. IMPRESSION: Unremarkable CT of the paranasal sinuses. Electronically Signed   By: Elgie Collard M.D.   On: 02/28/2015 18:31     CBC  Recent Labs Lab 02/25/15 2045 02/27/15 0750 02/27/15 1049 03/01/15 0614 03/02/15 0922  WBC 5.4 7.0 7.8 6.2 6.5  HGB 9.3* 9.2* 9.1* 9.2* 10.0*  HCT 29.3* 30.0* 28.9* 29.4* 31.4*  PLT 102* 122* 120* 150 186  MCV 91.8 93.8 92.0 93.6 92.1  MCH 29.2 28.8 29.0 29.3 29.3    MCHC 31.7 30.7 31.5 31.3 31.8  RDW 13.5 13.6 13.6 13.5 13.4  LYMPHSABS 0.6*  --   --   --   --   MONOABS 0.4  --   --   --   --   EOSABS 0.0  --   --   --   --   BASOSABS 0.0  --   --   --   --     Chemistries   Recent Labs Lab 02/25/15 2045 02/27/15 0750 03/01/15 0614  NA 139 138 139  K 4.3 4.5 4.4  CL 108 110 106  CO2 23 20* 24  GLUCOSE 118* 101* 98  BUN 32* 26* 18  CREATININE 1.75* 1.73* 1.48*  CALCIUM 8.1* 8.9 9.0  AST 17  --   --   ALT 10*  --   --   ALKPHOS 86  --   --   BILITOT 0.8  --   --    ------------------------------------------------------------------------------------------------------------------ estimated creatinine clearance is 34.3 mL/min (by C-G formula based on Cr of 1.48). ------------------------------------------------------------------------------------------------------------------ No results for input(s): HGBA1C in the last 72 hours. ------------------------------------------------------------------------------------------------------------------ No results for input(s): CHOL, HDL, LDLCALC, TRIG, CHOLHDL, LDLDIRECT in the last 72 hours. ------------------------------------------------------------------------------------------------------------------ No results for input(s): TSH, T4TOTAL, T3FREE, THYROIDAB in the last 72 hours.  Invalid input(s): FREET3 ------------------------------------------------------------------------------------------------------------------ No results for input(s): VITAMINB12, FOLATE, FERRITIN, TIBC, IRON, RETICCTPCT in  the last 72 hours.  Coagulation profile  Recent Labs Lab 02/26/15 0713 02/27/15 0750 02/28/15 0743 03/01/15 0614 03/02/15 0332  INR 2.40* 2.12* 2.75* 2.29* 2.60*    No results for input(s): DDIMER in the last 72 hours.  Cardiac Enzymes No results for input(s): CKMB, TROPONINI, MYOGLOBIN in the last 168 hours.  Invalid input(s):  CK ------------------------------------------------------------------------------------------------------------------ Invalid input(s): POCBNP     Time Spent in minutes   30 minutes    ELGERGAWY, DAWOOD M.D on 03/02/2015 at 11:03 AM  Between 7am to 7pm - Pager - [phone]  After 7pm go to [email or web address] - password [facility]  [facility]   Office  [phone]

## 2015-03-03 ENCOUNTER — Inpatient Hospital Stay (HOSPITAL_COMMUNITY): Payer: Medicare PPO

## 2015-03-03 LAB — PROTIME-INR
INR: 2.07 — ABNORMAL HIGH (ref 0.00–1.49)
PROTHROMBIN TIME: 23.2 s — AB (ref 11.6–15.2)

## 2015-03-03 LAB — BASIC METABOLIC PANEL
ANION GAP: 10 (ref 5–15)
BUN: 20 mg/dL (ref 6–20)
CALCIUM: 8.7 mg/dL — AB (ref 8.9–10.3)
CHLORIDE: 104 mmol/L (ref 101–111)
CO2: 25 mmol/L (ref 22–32)
Creatinine, Ser: 1.51 mg/dL — ABNORMAL HIGH (ref 0.61–1.24)
GFR calc Af Amer: 45 mL/min — ABNORMAL LOW (ref >60)
GFR calc non Af Amer: 39 mL/min — ABNORMAL LOW (ref >60)
GLUCOSE: 99 mg/dL (ref 65–99)
Potassium: 3.5 mmol/L (ref 3.5–5.1)
Sodium: 139 mmol/L (ref 135–145)

## 2015-03-03 MED ORDER — SODIUM CHLORIDE 0.9 % IJ SOLN
10.0000 mL | INTRAMUSCULAR | Status: DC | PRN
Start: 2015-03-03 — End: 2015-03-08
  Administered 2015-03-05 – 2015-03-08 (×4): 10 mL
  Filled 2015-03-03 (×4): qty 40

## 2015-03-03 MED ORDER — OXYCODONE HCL 5 MG PO TABS
5.0000 mg | ORAL_TABLET | Freq: Every day | ORAL | Status: DC | PRN
  Administered 2015-03-04 – 2015-03-08 (×3): 5 mg via ORAL
  Filled 2015-03-03 (×3): qty 1

## 2015-03-03 MED ORDER — WARFARIN SODIUM 3 MG PO TABS
3.0000 mg | ORAL_TABLET | Freq: Once | ORAL | Status: AC
  Administered 2015-03-03: 3 mg via ORAL
  Filled 2015-03-03: qty 1

## 2015-03-03 NOTE — Progress Notes (Signed)
ANTIC llation  Allergies  Allergen Reactions  . Tape Other (See Comments)    Pulls skin off  . Vancomycin Other (See Comments)    Hearing loss    Patient Measurements: Height: 5\' 10"  (177.8 cm) Weight: 180 lb 6.4 oz (81.829 kg) IBW/kg (Calculated) : 73  Vital Signs: Temp: 99.5 F (37.5 C) (12/23 0552) Temp Source: Oral (12/23 0552) BP: 140/71 mmHg (12/23 0552) Pulse Rate: 87 (12/23 0552)  Labs:  Recent Labs  03/01/15 0614 03/02/15 0332 03/02/15 0922 03/03/15 0402  HGB 9.2*  --  10.0*  --   HCT 29.4*  --  31.4*  --   PLT 150  --  186  --   LABPROT 25.0* 27.5*  --  23.2*  INR 2.29* 2.60*  --  2.07*  CREATININE 1.48*  --   --  1.51*    Estimated Creatinine Clearance: 33.6 mL/min (by C-G formula based on Cr of 1.51).   Medications:  Prescriptions prior to admission  Medication Sig Dispense Refill Last Dose  . docusate sodium (COLACE) 100 MG capsule Take 100 mg by mouth 2 (two) times daily.   02/25/2015 at Unknown time  . folic acid (FOLVITE) 1 MG tablet Take 1 mg by mouth daily.    02/25/2015 at Unknown time  . furosemide (LASIX) 20 MG tablet Take 20 mg by mouth daily as needed (swelling).   2 weeks  . gabapentin (NEURONTIN) 300 MG capsule Take 300 mg by mouth 2 (two) times daily.   02/25/2015 at Unknown time  . HYDROcodone-acetaminophen (NORCO) 10-325 MG tablet Take 1-2 tablets by mouth every 4 (four) hours as needed. (PAIN)   02/25/2015 at 1600  . hydrocortisone cream 1 % Apply 1 application topically 2 (two) times daily as needed (for leg itching).   Past Month at Unknown time  . Multiple Vitamins-Minerals (PRESERVISION AREDS 2) CAPS Take 1 tablet by mouth 2 (two) times daily. Reported on 02/23/2015   02/25/2015 at Unknown time  . omeprazole (PRILOSEC) 20 MG capsule Take 20 mg by mouth daily.   02/25/2015 at Unknown time  . predniSONE (DELTASONE) 5 MG tablet Take 1  tablet (5 mg total) by mouth daily with breakfast. 30 tablet 1 02/25/2015 at Unknown time  . rOPINIRole (REQUIP) 1 MG tablet Take 1 mg by mouth daily.   02/24/2015 at Unknown time  . simvastatin (ZOCOR) 20 MG tablet Take 20 mg by mouth at bedtime.    02/24/2015 at Unknown time  . traMADol (ULTRAM) 50 MG tablet Take 25 mg by mouth daily.   02/25/2015 at Unknown time  . warfarin (COUMADIN) 6 MG tablet Take 1 tablet (6 mg total) by mouth every evening. (Patient taking differently: Take 6 mg by mouth every evening. Tuesday and Saturday take , and Monday, Wednesday, Thursday, Friday, and Sunday take ) 30 tablet 0 02/24/2015 at 2000    Assessment: 81 y o M admitted 02/25/2015 with severe back pain. Patient is on Coumadin PTA for Afib. INR therapeutic on admission. INR has been difficult to maintain therapeutic during this admission, may need to consider alternative dosing regimen upon patient discharge.  INR today is 2.07. H/H stable, Plt wnl. No s/sx of bleeding noted.  PTA Coumadin dose: 6 mg daily, except  on Tuesday and Saturday  Goal of Therapy:  INR 2-3 Monitor platelets by anticoagulation protocol: Yes   Plan:  - Give warfarin 3 mg x1 tonight -  Monitor daily INR, CBC and s/sx of bleeding  Casilda Carlsaylor Lemon Whitacre, PharmD. PGY-1 Pharmacy Resident Pager: [phone]  03/03/2015,9:23 AM

## 2015-03-03 NOTE — Progress Notes (Signed)
Physical Therapy Treatment Patient Details Name: Ethan Lawrence MRN: [id number] DOB: 1922/04/12 Today's Date: 03/03/2015    History of Present Illness Ethan Lawrence is a 81 y.o. male with a history of CAD, CHF,Chronic Atrial Fib on Coumadin Rx, AV Block S/P Pacemaker, AS, AAA S/P Repair, HTN who presents to the ED with complaints of sudden onset of Intractable 10/10 Spasmodic low back pain for several days. 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    PT Comments    Pt making slow progress and continues to be limited by severe back pain especially with transitional movements and sitting. Unable to get pt comfortable in chair despite multiple attempts at positioning and pt was premedicated. Had to assist pt back to bed.  Follow Up Recommendations  SNF     Equipment Recommendations  Rolling walker with 5" wheels    Recommendations for Other Services       Precautions / Restrictions Precautions Precautions: Fall Precaution Comments: severe back pain Restrictions Weight Bearing Restrictions: No    Mobility  Bed Mobility Overal bed mobility: Needs Assistance Bed Mobility: Sit to Sidelying         Sit to sidelying: Min assist General bed mobility comments: to bring legs up into bed  Transfers Overall transfer level: Needs assistance Equipment used: Rolling walker (2 wheeled) Transfers: Sit to/from Stand Sit to Stand: Mod assist;+2 safety/equipment         General transfer comment: verbal cues for hand placement and assist to bring hips up.  Ambulation/Gait Ambulation/Gait assistance: Min assist;+2 safety/equipment Ambulation Distance (Feet): 50 Feet (x 2) Assistive device: Rolling walker (2 wheeled) Gait Pattern/deviations: Step-through pattern;Decreased step length - right;Decreased step length - left;Shuffle;Trunk flexed Gait velocity: decr Gait velocity interpretation: <1.8 ft/sec, indicative of risk for recurrent falls General Gait  Details: Pt with incr flexion of hips and knees as distance increased.   Stairs            Wheelchair Mobility    Modified Rankin (Stroke Patients Only)       Balance Overall balance assessment: Needs assistance Sitting-balance support: Bilateral upper extremity supported Sitting balance-Leahy Scale: Poor Sitting balance - Comments: Upper extremity support due to pain   Standing balance support: Bilateral upper extremity supported Standing balance-Leahy Scale: Poor Standing balance comment: support of walker and min A for static standing                    Cognition Arousal/Alertness: Awake/alert Behavior During Therapy: WFL for tasks assessed/performed Overall Cognitive Status: Within Functional Limits for tasks assessed       Memory: Decreased short-term memory              Exercises      General Comments        Pertinent Vitals/Pain Pain Assessment: 0-10 Pain Score: 10-Worst pain ever Pain Location: low back Pain Descriptors / Indicators: Sharp Pain Intervention(s): Limited activity within patient's tolerance;Monitored during session;Repositioned;Premedicated before session    Home Living                      Prior Function            PT Goals (current goals can now be found in the care plan section) Progress towards PT goals: Progressing toward goals    Frequency  Min 3X/week    PT Plan Discharge plan needs to be updated    Co-evaluation  End of Session Equipment Utilized During Treatment: Gait belt Activity Tolerance: Patient limited by pain Patient left: with call bell/phone within reach;in chair;with chair alarm set     Time: 1040-1106 PT Time Calculation (min) (ACUTE ONLY): 26 min  Charges:  $Gait Training: 23-37 mins                    G Codes:      Ethan Lawrence 03/03/2015, 1:25 PM [facility] [phone]

## 2015-03-03 NOTE — Clinical Social Work Note (Signed)
Clinical Social Work Assessment  Patient Details  Name: Ethan Lawrence MRN: [id number] Date of Birth: Jul 31, 1922  Date of referral:  03/03/15               Reason for consult:  Facility Placement, Discharge Planning                Permission sought to share information with:  Family Supports, Customer service manager, Case Optician, dispensing granted to share information::  Yes, Verbal Permission Granted  Name::      Ethan Lawrence )  Agency::   (SNF's )  Relationship::   (Daughter )  Contact Information:   [phone])  Housing/Transportation Living arrangements for the past 2 months:  [place] Information:  Patient, Adult Children Patient Interpreter Needed:  None Criminal Activity/Legal Involvement Pertinent to Current Situation/Hospitalization:  No - Comment as needed Significant Relationships:  Adult Children Lives with:  Self Do you feel safe going back to the place where you live?  No Need for family participation in patient care:  Yes (Comment)  Care giving concerns:  Patient requiring short-term rehab at skilled level.    Social Worker assessment / plan:  CSW met with patient and pt's dtr, Ethan Hammans present at bedside in reference to post-acute placement. CSW introduced CSW role and SNF process. Pt's dtr reported that pt live in [place] and was visiting with her when he begin experiencing back pain. Pt's dtr wishes for pt to remain in [place] for short-term rehab, plan is to return to [place]. CSW explained barrier: [place] MUST PASARR requirement and patient not being a [place] resident. [facility] is currently closed and will not re-open until Tuesday, 12/27. No further concerns reported at this time by family or patient. CSW remains available as needed.   Employment status:  Retired Nurse, adult PT Recommendations:  [place] / Referral to community resources:  [place]  Patient/Family's Response to care:  Pt a/o x4. Pt and dtr agreeable to SNF placement. Pt agreeable to staying in [place] area for rehab. Family supportive and involved in pt's care. Pt's dtr pleasant and appreciated social work intervention.   Patient/Family's Understanding of and Emotional Response to Diagnosis, Current Treatment, and Prognosis:  Pt's dtr understanding that patient will need PASARR to remain in [place] as SNF resident. Pt's dtr aware that pt has infection and currently is not a candidate for surgical intervention due to infection. Pt received PICC line today, 12/23.   Emotional Assessment Appearance:  Appears stated age Attitude/Demeanor/Rapport:   (Pleasant ) Affect (typically observed):  Accepting, Appropriate, Pleasant Orientation:  Oriented to  Time, Oriented to Place, Oriented to Situation, Oriented to Self Alcohol / Substance use:  Not Applicable Psych involvement (Current and /or in the community):  No (Comment)  Discharge Needs  Concerns to be addressed:  Care Coordination Readmission within the last 30 days:  No Current discharge risk:  Dependent with Mobility Barriers to Discharge:  Continued Medical Work up   [employer], MSW, LCSWA [phone] 03/03/2015 4:28 PM

## 2015-03-03 NOTE — Progress Notes (Signed)
Peripherally Inserted Central Catheter/Midline Placement  The IV Nurse has discussed with the patient and/or persons authorized to consent for the patient, the purpose of this procedure and the potential benefits and risks involved with this procedure.  The benefits include less needle sticks, lab draws from the catheter and patient may be discharged home with the catheter.  Risks include, but not limited to, infection, bleeding, blood clot (thrombus formation), and puncture of an artery; nerve damage and irregular heat beat.  Alternatives to this procedure were also discussed.  PICC/Midline Placement Documentation     Daughter signed consent at bedside   Maximino GreenlandLumban, Brewer Hitchman Albarece 03/03/2015, 9:09 AM

## 2015-03-03 NOTE — Progress Notes (Signed)
Patient Demographics  Ethan Lawrence, is a 81 y.o. male, DOB - November 08, 1922, ZOX:[id number]  Admit date - 02/25/2015   Admitting Physician Ron Parker, MD  Outpatient Primary MD for the patient is PROVIDER NOT IN SYSTEM  LOS - 5   Chief Complaint  Patient presents with  . Back Pain       Admission HPI/Brief narrative: 81 yo very functional male history of CAD, CHF,Chronic Atrial Fib on Coumadin Rx, AV Block S/P Pacemaker, AS, AAA S/P Repair, HTN, admitted with back pain progressive over the past 2-3 days and febrile illness for a week. He has been running a fever nightly for the past week, found to have fever of 103 on admission. [name] blood cultures are growing streptococcus beta evidence and ID was consulted. He underwent a TEE which showed  endocarditis. He has a very tender right sided inguinal hernia and surgery evaluated without need for acute interventions,  complaining of lower back pain , possible discitis.  Subjective:   [name] today has, No headache, No chest pain, No abdominal pain - No Nausea,  episodes of significant lower back pain with physical therapy . 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 - Blood culture growing Streptococcus viridans -  Sepsis secondary to strep viridans bacteremia/endocarditis, questionable discitis, unable to obtain MRI lumbar spine secondary to pacemaker, unable to obtain CT lumbar spine with contrast secondary to chronic kidney disease. - Currently afebrile, no leukocytosis - Physiology of sepsis resolved  Strep Viridans bacteremia/ Endocarditis  - TEE on 12/20 showing a 8 mm x 4 mm mobile target on  mitral valve and  prosthetic aortic valve sewing ring. No vegetations were noted on the pacer lead. - ID input greatly appreciated, plan to continue with IV antibiotics for 8 weeks. - IV antibiotic has been narrowed to Rocephin 2 g IV daily. - Discussed with CT surgery Dr. Morton Peters, currently patient is not a surgical candidate - Discussed with Dr. Kristin Bruins regarding finding of possible periapical abscess on panoramic view, he does not find clinical evidence for this on his reading and evaluation. - We'll request PICC line insertion  New onset severe lower back pain - CT lumbar spine without contrast with no evidence of discitis, but high clinical suspicion, a shunt will be treated with 8 weeks of IV antibiotics for his endocarditis which should treat this as well. - continue with pain medicine as needed - PT following   Inguinal hernia - general surgery consulted given possible right inguinal hernia and tenderness in that area, defer any intervention for now   New onset  severe back pain - ? Infectious etiology - Pacemaker none compatible with MRI, CT lumbar spine with IV contrast can be obtained due to CKD. - No evidence of discitis on CT lumbar spine without contrast, but low yield given his without contrast  Anemia - chronic, stable  Chronic A fib -On warfarin, will hold for possible need for dental procedure  CAD s/p CABG - stable, no chest pain  AV block s/p pacemaker  HTN - continue home medications  Acute on chronic diastolic heart failure - some vascular congestion on CXR, on room  air comfortable in the room , will resume Lasix given worsening lower extremity edema.   CKD stage III - Cr close to baseline, received IV contrast, closely monitor - Cr stable   Thrombocytopenia - chronic, stable  Code Status: Full  Family Communication: D/W daughter at bedside  Disposition Plan: SNF in 24 hours   Procedures    TEE Impressions: - There is a 8 mm x 4 mm mobile target on the atrial side of themitral valve at the region of the P3 segment that is concerning for endocarditis given the clinical scenario. Cannot rule outruptured cord. There is no associated valve destruction and themitral regurgitation appears to be separate from the suspectedlesion. There is also a mobile target on the superior surface of the prosthetic aortic valve sewing ring. The aortic valve gradients are moderately elevated, consistent with moderate steonis, asseen on transthoracic echo. The valve is well-seated and there isno rocking motion. There is no aortic reguritation.No vegetations were noted on the pacer lead.   Consults   Infectious disease Gen. surgery Cardiology via phone CT surgery Dr. Morton PetersVan Tright via phone   Medications  Scheduled Meds: . cefTRIAXone (ROCEPHIN)  IV  2 g Intravenous Q24H  . clotrimazole   Topical BID  . diclofenac  1 patch Transdermal BID  . docusate sodium  100 mg Oral BID  . folic acid  1 mg Oral Daily  . furosemide  20 mg Oral Daily  . gabapentin  300 mg Oral BID  . multivitamin-lutein  1 capsule Oral BID  . pantoprazole  40 mg Oral Daily  . polyethylene glycol  17 g Oral Daily  . predniSONE  5 mg Oral Q breakfast  . rOPINIRole  1 mg Oral Daily  . simvastatin  20 mg Oral QHS  . sodium chloride  3 mL Intravenous Q12H  . warfarin  3 mg Oral ONCE-1800  . Warfarin - Pharmacist Dosing Inpatient   Does not apply q1800   Continuous Infusions:  PRN Meds:.sodium chloride, acetaminophen **OR** acetaminophen, alum & mag hydroxide-simeth,  HYDROmorphone (DILAUDID) injection, HYDROmorphone (DILAUDID) injection, methocarbamol, ondansetron **OR** ondansetron (ZOFRAN) IV, oxyCODONE, oxyCODONE, sodium chloride, sodium chloride  DVT Prophylaxis  warfarin  Lab Results  Component Value Date   PLT 186 03/02/2015    Antibiotics    Anti-infectives    Start     Dose/Rate Route Frequency Ordered Stop   02/28/15 1200  cefTRIAXone (ROCEPHIN) 2 g in dextrose 5 % 50 mL IVPB     2 g 100 mL/hr over 30 Minutes Intravenous Every 24 hours 02/28/15 1055     02/27/15 2000  Ampicillin-Sulbactam (UNASYN) 3 g in sodium chloride 0.9 % 100 mL IVPB  Status:  Discontinued     3 g 100 mL/hr over 60 Minutes Intravenous Every 12 hours 02/27/15 1931 02/28/15 1054   02/27/15 0600  vancomycin (VANCOCIN) IVPB 1000 mg/200 mL premix  Status:  Discontinued  1,000 mg 200 mL/hr over 60 Minutes Intravenous Every 24 hours 02/26/15 0706 02/28/15 1054   02/26/15 1500  piperacillin-tazobactam (ZOSYN) IVPB 3.375 g  Status:  Discontinued     3.375 g 12.5 mL/hr over 240 Minutes Intravenous 3 times per day 02/26/15 0706 02/27/15 1859   02/26/15 0730  piperacillin-tazobactam (ZOSYN) IVPB 3.375 g     3.375 g 100 mL/hr over 30 Minutes Intravenous STAT 02/26/15 0706 02/26/15 1105   02/26/15 0530  vancomycin (VANCOCIN) 1,250 mg in sodium chloride 0.9 % 250 mL IVPB     1,250 mg 166.7 mL/hr over 90 Minutes Intravenous  Once 02/26/15 0517 02/26/15 0712   02/25/15 2045  cefTRIAXone (ROCEPHIN) 2 g in dextrose 5 % 50 mL IVPB     2 g 100 mL/hr over 30 Minutes Intravenous  Once 02/25/15 2040 02/25/15 2244          Objective:   Filed Vitals:   03/02/15 2124 03/03/15 0125 03/03/15 0552 03/03/15 1136  BP: 118/62 145/108 140/71 119/71  Pulse: 80 100 87 99  Temp: 98.5 F (36.9 C) 98.2 F (36.8 C) 99.5 F (37.5 C) 98.9 F (37.2 C)  TempSrc: Oral Oral Oral Oral  Resp: 20 18 18 20   Height:      Weight:      SpO2: 100% 100% 98% 96%    Wt Readings from Last 3  Encounters:  02/26/15 81.829 kg (180 lb 6.4 oz)  02/23/15 77.656 kg (171 lb 3.2 oz)  02/21/15 78.291 kg (172 lb 9.6 oz)     Intake/Output Summary (Last 24 hours) at 03/03/15 1439 Last data filed at 03/03/15 1022  Gross per 24 hour  Intake    120 ml  Output    675 ml  Net   -555 ml     Physical Exam  Awake Alert, Oriented X 3,  [place].AT,PERRAL Supple Neck,No JVD,  Symmetrical Chest wall movement, Good air movement bilaterally, CTAB irregular, murmur +, No Gallops,Rubs , No Parasternal Heave +ve B.Sounds, Abd Soft, No tenderness, No organomegaly appriciated, No rebound - guarding or rigidity. No Cyanosis, Clubbing , No new Rash or bruise     Data Review   Micro Results Recent Results (from the past 240 hour(s))  Blood Culture (routine x 2)     Status: None   Collection Time: 02/25/15  8:45 PM  Result Value Ref Range Status   Specimen Description BLOOD RIGHT FOREARM  Final   Special Requests BOTTLES DRAWN AEROBIC AND ANAEROBIC 5C  Final   Culture  Setup Time   Final    GRAM POSITIVE COCCI IN PAIRS CRITICAL RESULT CALLED TO, READ BACK BY AND VERIFIED WITH: K BREINICH 02/26/15 @ 1209 M VESTAL IN BOTH AEROBIC AND ANAEROBIC BOTTLES    Culture   Final    VIRIDANS STREPTOCOCCUS Performed at [facility]    Report Status 02/28/2015 FINAL  Final   Organism ID, Bacteria VIRIDANS STREPTOCOCCUS  Final      Susceptibility   Viridans streptococcus - MIC*    ERYTHROMYCIN <=0.12 SENSITIVE Sensitive     TETRACYCLINE >=16 RESISTANT Resistant     VANCOMYCIN 0.5 SENSITIVE Sensitive     CLINDAMYCIN <=0.25 SENSITIVE Sensitive     PENICILLIN <=0.06 SENSITIVE Sensitive     CEFTRIAXONE <=0.12 SENSITIVE Sensitive     LEVOFLOXACIN 2 SENSITIVE Sensitive     * VIRIDANS STREPTOCOCCUS  Blood Culture (routine x 2)     Status: None   Collection Time: 02/25/15  9:15 PM  Result Value Ref Range Status   Specimen Description BLOOD LEFT FOREARM  Final   Special Requests BOTTLES DRAWN AEROBIC  AND ANAEROBIC 5CC  Final   Culture  Setup Time   Final    GRAM POSITIVE COCCI IN PAIRS IN BOTH AEROBIC AND ANAEROBIC BOTTLES CRITICAL RESULT CALLED TO, READ BACK BY AND VERIFIED WITH: K BREINICH 02/26/15 @ 1209 M VESTAL    Culture   Final    VIRIDANS STREPTOCOCCUS SUSCEPTIBILITIES PERFORMED ON PREVIOUS CULTURE WITHIN THE LAST 5 DAYS. Performed at [facility]    Report Status 02/28/2015 FINAL  Final  Urine culture     Status: None   Collection Time: 02/25/15 10:59 PM  Result Value Ref Range Status   Specimen Description URINE, CLEAN CATCH  Final   Special Requests NONE  Final   Culture   Final    8,000 COLONIES/mL INSIGNIFICANT GROWTH Performed at [facility]    Report Status 02/27/2015 FINAL  Final  Culture, blood (Routine X 2) w Reflex to ID Panel     Status: None (Preliminary result)   Collection Time: 02/27/15  6:45 PM  Result Value Ref Range Status   Specimen Description BLOOD LEFT ARM  Final   Special Requests BOTTLES DRAWN AEROBIC AND ANAEROBIC 5CC  Final   Culture NO GROWTH 4 DAYS  Final   Report Status PENDING  Incomplete  Culture, blood (Routine X 2) w Reflex to ID Panel     Status: None (Preliminary result)   Collection Time: 02/27/15  6:50 PM  Result Value Ref Range Status   Specimen Description BLOOD LEFT HAND  Final   Special Requests IN PEDIATRIC BOTTLE 1CC  Final   Culture NO GROWTH 4 DAYS  Final   Report Status PENDING  Incomplete    Radiology Reports Dg Orthopantogram  02/28/2015  CLINICAL DATA:  Jaw pain EXAM: ORTHOPANTOGRAM/PANORAMIC COMPARISON:  None. FINDINGS: The patient is predominantly edentulous. On the left adjacent to the root of the second mandibular bicuspid there is a circular lucency identified suggestive of periapical abscess. IMPRESSION: Likely periapical abscess on the left in the mandible adjacent to the root of the second bicuspid. Electronically Signed   By: Alcide Clever M.D.   On: 02/28/2015 14:13   Dg Chest 1  View  02/25/2015  CLINICAL DATA:  81 year old male with back pain for 6 hours. EXAM: CHEST 1 VIEW COMPARISON:  Chest radiograph 01/20/2013. Included lung bases from CT abdomen/pelvis performed concurrently. FINDINGS: Single lead left-sided pacemaker remains in place. Patient is post median sternotomy with 2 prosthetic valves. Multi chamber cardiomegaly, progressed from prior exam. There is tortuosity and atherosclerosis of the thoracic aorta. Vascular congestion with possible mild perihilar edema. Calcified granuloma in the right upper lung. Small pleural effusions on CT are not seen radiographically. No confluent airspace disease. No pneumothorax. IMPRESSION: Cardiomegaly with vascular congestion and questionable perihilar edema. Recommend correlation for CHF. Electronically Signed   By: Rubye Oaks M.D.   On: 02/25/2015 22:30   Dg Thoracic Spine 2 View  02/25/2015  CLINICAL DATA:  Lower back pain since 4:30 this afternoon. History of chronic back pain. No injury noted. EXAM: THORACIC SPINE 2 VIEWS COMPARISON:  Chest x-ray dated 01/20/2013. FINDINGS: Portions of the thoracic spine are incompletely seen due to overlying osseous and soft tissue structures. Overall osseous alignment appears normal and grossly stable compared to a previous chest x-ray of 01/20/2013. No osseous fracture or dislocation seen. No acute - appearing cortical irregularity or osseous lesion.  Paravertebral soft tissues are unremarkable for acute process. IMPRESSION: No acute findings. Electronically Signed   By: Bary Richard M.D.   On: 02/25/2015 22:29   Dg Lumbar Spine Complete  02/25/2015  CLINICAL DATA:  81 year old male with lumbosacral back pain for 6 hours. EXAM: LUMBAR SPINE - COMPLETE 4+ VIEW COMPARISON:  Reformats from CT abdomen/ pelvis 09/02/2011. FINDINGS: Kyphoplasty within L4 compression deformity. Remaining vertebral body heights are maintained. Mild levoscoliosis centered at L2-L3 with associated degenerative  disc disease. Additional disc space narrowing at L1-L2. The bones are under mineralized. Aorto bi-iliac stent in place. IMPRESSION: 1. Kyphoplasty within L4 compression deformity. 2. Scoliosis and degenerative change.  No acute bony abnormality. Electronically Signed   By: Rubye Oaks M.D.   On: 02/25/2015 22:26   Ct Lumbar Spine Wo Contrast  02/28/2015  CLINICAL DATA:  Several day history of low back pain. EXAM: CT LUMBAR SPINE WITHOUT CONTRAST TECHNIQUE: Multidetector CT imaging of the lumbar spine was performed without intravenous contrast administration. Multiplanar CT image reconstructions were also generated. COMPARISON:  CT scan 02/25/2015 FINDINGS: Remote vertebral augmentation changes at L4 a with a significant compression deformity. Mild stable retropulsion and mild canal encroachment. New line the other lumbar vertebral bodies are maintained. No acute fractures identified. S1 is not covered on this examination. Stable advanced degenerative disc disease at L3-4 along with advanced facet disease at L3-4 and L4-5. There are moderate-sized bilateral pleural effusions noted along with overlying basilar atelectasis. Stable aortoiliac stent graft. No complicating features are demonstrated. L1-2: Broad-based bulging annulus and osteophytic ridging with mild left lateral recess and left foraminal encroachment. This could irritate the left L1 nerve root. L2-3: Advanced degenerative disc disease and facet disease with a bulging annulus and osteophytic ridging. Mild flattening of the ventral thecal sac but no significant spinal or foraminal stenosis. L3-4: Wide decompressive laminectomy and vertebral augmentation changes. No significant spinal stenosis. Right-sided disc osteophyte complex with significant right foraminal stenosis likely effecting the right L3 nerve root. L4-5: Bulging annulus, osteophytic ridging and facet disease with mild spinal and bilateral lateral recess stenosis. No significant  foraminal stenosis. L5-S1:  Bulging annulus but no spinal or foraminal stenosis. IMPRESSION: 1. Remote L4 compression fracture with vertebral augmentation changes. 2. No new/acute fractures are identified. 3. Mild left lateral recess and left foraminal stenosis at L1-2. 4. Right foraminal/extra foraminal disc osteophyte complex on the right at L3-4 likely effecting the right L3 nerve root. 5. Mild spinal and bilateral lateral recess stenosis at L4-5. 6. Bilateral moderate-sized pleural effusions. Electronically Signed   By: Rudie Meyer M.D.   On: 02/28/2015 18:40   Ct Abdomen Pelvis W Contrast  02/25/2015  CLINICAL DATA:  Severe low back pain with any movement. History of chronic back pain. Abdominal pain and tenderness. EXAM: CT ABDOMEN AND PELVIS WITH CONTRAST TECHNIQUE: Multidetector CT imaging of the abdomen and pelvis was performed using the standard protocol following bolus administration of intravenous contrast. CONTRAST:  70mL OMNIPAQUE IOHEXOL 300 MG/ML  SOLN COMPARISON:  09/02/2011 FINDINGS: Small bilateral pleural effusions. Multiple surgical clips at the EG junction. Postoperative changes in the mediastinum. Surgical absence of the gallbladder. Calcified granulomas scattered throughout the liver and spleen. No hepatosplenomegaly. The pancreas, adrenal glands, inferior vena cava, and retroperitoneal lymph nodes are unremarkable. Abdominal aortic aneurysm post aortoiliac stent grafts. Native sac diameter is 5.6 cm. This measurement is similar to the previous study. Kidneys are atrophic bilaterally. Renal nephrograms appear symmetrical. No hydronephrosis. Large cysts in both kidneys, largest is  off of the lower pole of the left kidney and measures about 8.9 cm diameter. This cyst is increased in size since the previous study. The stomach, small bowel, and colon are not abnormally distended. The colon is diffusely stool-filled. No free air or free fluid in the abdomen. Surgical clips in the anterior  abdominal wall. Pelvis: Prostate gland is not enlarged. Prostate defect may indicate previous TUR procedure. Bladder wall is not thickened. Appendix is surgically absent. No free or loculated pelvic fluid collections. No pelvic mass or lymphadenopathy. Vascular calcifications in the pelvis. Fat in the inguinal canals. Degenerative changes in the lumbar spine. L4 compression deformity post kyphoplasty. No destructive bone lesions. IMPRESSION: No significant acute process demonstrated in the abdomen or pelvis. There are small bilateral pleural effusions. Postoperative changes demonstrated throughout the chest, abdomen, and pelvis. Abdominal aortic aneurysm post stent graft. Native sac diameter is 5.6 cm without change since prior study. Multiple renal cysts with enlarging large cyst on the left kidney. Electronically Signed   By: Burman Nieves M.D.   On: 02/25/2015 22:42   Dg Chest Port 1 View  03/03/2015  CLINICAL DATA:  Bedside PICC placement. EXAM: PORTABLE CHEST 1 VIEW COMPARISON:  02/25/2015 and earlier. FINDINGS: Right arm PICC tip projects over the lower SVC near the cavoatrial junction. Prior sternotomy for CABG, aortic valve replacement and mitral valve replacement or annuloplasty. Stable moderate to marked cardiomegaly. 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 Calcified granuloma in the right upper lobe. Lungs otherwise clear. No pneumothorax. No visible pleural effusions. 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 IMPRESSION: 1. Right arm PICC tip projects over the lower SVC near the cavoatrial junction. 2. Stable moderate to marked cardiomegaly. No acute cardiopulmonary disease. Electronically Signed   By: Hulan Saas M.D.   On: 03/03/2015 09:33   Ct Maxillofacial Ltd Wo Cm  02/28/2015  CLINICAL DATA:  81 year old male with sinusitis and bacteremia. EXAM: CT PARANASAL SINUS LIMITED WITHOUT CONTRAST TECHNIQUE: Non-contiguous multidetector CT images of the  paranasal sinuses were obtained in a single plane without contrast. COMPARISON:  None. FINDINGS: There is no acute fracture of the visualized facial bones. Bilateral cataract surgeries noted. The globes are otherwise intact. The retro-orbital fat and orbital walls are preserved. The paranasal sinuses are well aerated. No significant mucoperiosteal thickening. No air-fluid level. No soft tissue swelling noted. No acute hemorrhage identified within the visualized brain. IMPRESSION: Unremarkable CT of the paranasal sinuses. Electronically Signed   By: Elgie Collard M.D.   On: 02/28/2015 18:31     CBC  Recent Labs Lab 02/25/15 2045 02/27/15 0750 02/27/15 1049 03/01/15 0614 03/02/15 0922  WBC 5.4 7.0 7.8 6.2 6.5  HGB 9.3* 9.2* 9.1* 9.2* 10.0*  HCT 29.3* 30.0* 28.9* 29.4* 31.4*  PLT 102* 122* 120* 150 186  MCV 91.8 93.8 92.0 93.6 92.1  MCH 29.2 28.8 29.0 29.3 29.3  MCHC 31.7 30.7 31.5 31.3 31.8  RDW 13.5 13.6 13.6 13.5 13.4  LYMPHSABS 0.6*  --   --   --   --   MONOABS 0.4  --   --   --   --   EOSABS 0.0  --   --   --   --   BASOSABS 0.0  --   --   --   --     Chemistries   Recent Labs Lab 02/25/15 2045 02/27/15 0750 03/01/15 0614 03/03/15 0402  NA 139 138 139 139  K 4.3 4.5 4.4 3.5  CL 108 110  106 104  CO2 23 20* 24 25  GLUCOSE 118* 101* 98 99  BUN 32* 26* 18 20  CREATININE 1.75* 1.73* 1.48* 1.51*  CALCIUM 8.1* 8.9 9.0 8.7*  AST 17  --   --   --   ALT 10*  --   --   --   ALKPHOS 86  --   --   --   BILITOT 0.8  --   --   --    ------------------------------------------------------------------------------------------------------------------ estimated creatinine clearance is 33.6 mL/min (by C-G formula based on Cr of 1.51). ------------------------------------------------------------------------------------------------------------------ No results for input(s): HGBA1C in the last 72  hours. ------------------------------------------------------------------------------------------------------------------ No results for input(s): CHOL, HDL, LDLCALC, TRIG, CHOLHDL, LDLDIRECT in the last 72 hours. ------------------------------------------------------------------------------------------------------------------ No results for input(s): TSH, T4TOTAL, T3FREE, THYROIDAB in the last 72 hours.  Invalid input(s): FREET3 ------------------------------------------------------------------------------------------------------------------ No results for input(s): VITAMINB12, FOLATE, FERRITIN, TIBC, IRON, RETICCTPCT in the last 72 hours.  Coagulation profile  Recent Labs Lab 02/27/15 0750 02/28/15 0743 03/01/15 0614 03/02/15 0332 03/03/15 0402  INR 2.12* 2.75* 2.29* 2.60* 2.07*    No results for input(s): DDIMER in the last 72 hours.  Cardiac Enzymes No results for input(s): CKMB, TROPONINI, MYOGLOBIN in the last 168 hours.  Invalid input(s): CK ------------------------------------------------------------------------------------------------------------------ Invalid input(s): POCBNP     Time Spent in minutes   20 minutes    Jayesh Marbach M.D on 03/03/2015 at 2:39 PM  Between 7am to 7pm - Pager - [phone]  After 7pm go to [email or web address] - password [facility]  [facility]   Office  [phone]

## 2015-03-03 NOTE — Clinical Social Work Placement (Signed)
   CLINICAL SOCIAL WORK PLACEMENT  NOTE  Date:  03/03/2015  Patient Details  Name: Providence LaniusCharles E Tsuda MRN: [id number] Date of Birth: Mar 07, 1923  Clinical Social Work is seeking post-discharge placement for this patient at the Skilled  Nursing Facility level of care (*CSW will initial, date and re-position this form in  chart as items are completed):  Yes   Patient/family provided with [facility] Clinical Social Work Department's list of facilities offering this level of care within the geographic area requested by the patient (or if unable, by the patient's family).  Yes   Patient/family informed of their freedom to choose among providers that offer the needed level of care, that participate in Medicare, Medicaid or managed care program needed by the patient, have an available bed and are willing to accept the patient.  Yes   Patient/family informed of 's ownership interest in [facility] and [facility], as well as of the fact that they are under no obligation to receive care at these facilities.  PASRR submitted to EDS on       PASRR number received on       Existing PASRR number confirmed on       FL2 transmitted to all facilities in geographic area requested by pt/family on 03/03/15     FL2 transmitted to all facilities within larger geographic area on       Patient informed that his/her managed care company has contracts with or will negotiate with certain facilities, including the following:            Patient/family informed of bed offers received.  Patient chooses bed at       Physician recommends and patient chooses bed at      Patient to be transferred to   on  .  Patient to be transferred to facility by       Patient family notified on   of transfer.  Name of family member notified:        PHYSICIAN Please sign FL2     Additional Comment:    _______________________________________________ Derenda FennelBashira Eldin Bonsell, MSW, LCSWA [phone]  [phone] 03/03/2015 4:58 PM

## 2015-03-03 NOTE — Clinical Social Work Note (Signed)
Clinical Social Worker has assessed patient and family. Full psychosocial assessment to follow.  Derenda FennelBashira Charlie Seda, MSW, LCSWA [phone] 03/03/2015 11:59 AM

## 2015-03-03 NOTE — [place] FL2 (Signed)
[place] MEDICAID FL2 LEVEL OF CARE SCREENING TOOL     IDENTIFICATION  Patient Name: Ethan Lawrence Birthdate: 14-Oct-1922 Sex: male Admission Date (Current Location): 02/25/2015  [facility] and [place] Number:  Producer, television/film/video and Address:  [facility], [address] [place], [place] [address]      [place] Carm[place]al Number: [id number]  Attending Physician Name and Address:  Starleen Arms, MD  Relative Name and Phone Number:       Current Level of Care: Hospital Recommended Level of Care: Skilled Nursing Facility Prior Approval Number:    Date Approved/Denied:   PASRR Number:    Discharge Plan: SNF    Current Diagnoses: Patient Active Problem List   Diagnosis Date Noted  . Dental abscess   . Streptococcus viridans infection 02/28/2015  . Acute endocarditis 02/28/2015  . Diskitis   . Prosthetic valve endocarditis (HCC)   . Endocarditis of native valve   . Lumbar back pain   . Gram-positive bacteremia   . Pacemaker infection (HCC)   . Febrile illness 02/26/2015  . Intractable back pain 02/26/2015  . Lumbar pain   . SIRS (systemic inflammatory response syndrome) (HCC)   . Pyrexia   . Dyspnea on exertion 02/21/2015  . Mitral regurgitation 02/21/2015  . Bilateral leg pain 11/15/2014  . Pacemaker 09/01/2013  . S/P AVR 03/01/2013  . Chronic diastolic heart failure (HCC) 03/01/2013  . Long term (current) use of anticoagulants 03/01/2013  . S/P left TKA 02/15/2013  . Abdominal aneurysm without mention of rupture 09/02/2011  . History of AAA (abdominal aortic aneurysm) repair 06/18/2011  . Anemia 06/18/2011  . GI bleed 06/18/2011  . Arthritis 06/18/2011  . Chronic a-fib on Coumadin 06/18/2011  . CAD (coronary artery disease) s/p CABG 06/18/2011  . HTN (hypertension) 06/18/2011    Orientation RESPIRATION BLADDER Height & Weight    Self, Time, Situation, Place  Normal Continent  (177.8 cm) 180 lbs.  BEHAVIORAL  SYMPTOMS/MOOD NEUROLOGICAL BOWEL NUTRITION STATUS   (NONE)  (NONE) Continent Diet (HEART HEALTHY )  AMBULATORY STATUS COMMUNICATION OF NEEDS Skin   Extensive Assist Verbally Normal                       Personal Care Assistance Level of Assistance  Bathing, Dressing Bathing Assistance: Limited assistance   Dressing Assistance: Limited assistance     Functional Limitations Info   (NONE)          SPECIAL CARE FACTORS FREQUENCY  PT (By licensed PT)     PT Frequency: 3              Contractures      Additional Factors Info  Code Status, Allergies Code Status Info: FULL CODE  Allergies Info: TAPE, VANCOMYCIN           Current Medications (03/03/2015):  This is the current hospital active medication list Current Facility-Administered Medications  Medication Dose Route Frequency Valleri Hendricksen Last Rate Last Dose  . 0.9 %  sodium chloride infusion  250 mL Intravenous PRN Ron Parker, MD 20 mL/hr at 03/03/15 0208 250 mL at 03/03/15 0208  . acetaminophen (TYLENOL) tablet 650 mg  650 mg Oral Q6H PRN Ron Parker, MD       Or  . acetaminophen (TYLENOL) suppository 650 mg  650 mg Rectal Q6H PRN Ron Parker, MD      . alum & mag hydroxide-simeth (MAALOX/MYLANTA) 200-200-20 MG/5ML suspension 30 mL  30 mL Oral Q6H PRN Ron Parker, MD      . cefTRIAXone (ROCEPHIN) 2 g in dextrose 5 % 50 mL IVPB  2 g Intravenous Q24H Randall Hiss, MD   2 g at 03/03/15 1349  . clotrimazole (LOTRIMIN) 1 % cream   Topical BID Costin Otelia Sergeant, MD      . diclofenac (FLECTOR) 1.3 % 1 patch  1 patch Transdermal BID Leatha Gilding, MD   Stopped at 03/03/15 [phone]  . docusate sodium (COLACE) capsule 100 mg  100 mg Oral BID Ron Parker, MD   100 mg at 03/03/15 1026  . folic acid (FOLVITE) tablet 1 mg  1 mg Oral Daily Ron Parker, MD   1 mg at 03/03/15 1026  . furosemide (LASIX) tablet 20 mg  20 mg Oral Daily Starleen Arms, MD   20 mg at 03/03/15 1026  .  gabapentin (NEURONTIN) capsule 300 mg  300 mg Oral BID Ron Parker, MD   300 mg at 03/03/15 1026  . HYDROmorphone (DILAUDID) injection 0.5 mg  0.5 mg Intravenous Once PRN Shawn C Joy, PA-C      . HYDROmorphone (DILAUDID) injection 0.5-1 mg  0.5-1 mg Intravenous Q3H PRN Ron Parker, MD   1 mg at 03/03/15 1419  . methocarbamol (ROBAXIN) tablet 500 mg  500 mg Oral Q6H PRN Leatha Gilding, MD   500 mg at 03/02/15 1816  . multivitamin-lutein (OCUVITE-LUTEIN) capsule 1 capsule  1 capsule Oral BID Leatha Gilding, MD   1 capsule at 03/02/15 2158  . ondansetron (ZOFRAN) tablet 4 mg  4 mg Oral Q6H PRN Ron Parker, MD       Or  . ondansetron (ZOFRAN) injection 4 mg  4 mg Intravenous Q6H PRN Ron Parker, MD      . oxyCODONE (Oxy IR/ROXICODONE) immediate release tablet 5 mg  5 mg Oral Q4H PRN Ron Parker, MD   5 mg at 03/03/15 1026  . oxyCODONE (Oxy IR/ROXICODONE) immediate release tablet 5 mg  5 mg Oral Daily PRN Starleen Arms, MD      . pantoprazole (PROTONIX) EC tablet 40 mg  40 mg Oral Daily Ron Parker, MD   40 mg at 03/03/15 1026  . polyethylene glycol (MIRALAX / GLYCOLAX) packet 17 g  17 g Oral Daily Starleen Arms, MD   17 g at 03/01/15 1050  . predniSONE (DELTASONE) tablet 5 mg  5 mg Oral Q breakfast Costin Otelia Sergeant, MD   5 mg at 03/03/15 1026  . rOPINIRole (REQUIP) tablet 1 mg  1 mg Oral Daily Ron Parker, MD   1 mg at 03/03/15 1026  . simvastatin (ZOCOR) tablet 20 mg  20 mg Oral QHS Ron Parker, MD   20 mg at 03/02/15 2158  . sodium chloride 0.9 % injection 10-40 mL  10-40 mL Intracatheter PRN Leana Roe Elgergawy, MD      . sodium chloride 0.9 % injection 3 mL  3 mL Intravenous Q12H Ron Parker, MD   3 mL at 03/01/15 2259  . sodium chloride 0.9 % injection 3 mL  3 mL Intravenous PRN Ron Parker, MD      . warfarin (COUMADIN) tablet 3 mg  3 mg Oral ONCE-1800 Ancil Boozer, RPH      . Warfarin - Pharmacist Dosing  Inpatient   Does not apply q1800 Silvana Newness, [facility]   Stopped at 02/28/15  2027     Discharge Medications: Please see discharge summary for a list of discharge medications.  Relevant Imaging Results:  Relevant Lab Results:   Additional Information [id number]  Derenda FennelBashira Nixon, MSW, LCSWA [phone] 03/03/2015 4:02 PM

## 2015-03-04 ENCOUNTER — Inpatient Hospital Stay (HOSPITAL_COMMUNITY): Payer: Medicare PPO

## 2015-03-04 LAB — URINALYSIS, ROUTINE W REFLEX MICROSCOPIC
Bilirubin Urine: NEGATIVE
GLUCOSE, UA: NEGATIVE mg/dL
Ketones, ur: NEGATIVE mg/dL
LEUKOCYTES UA: NEGATIVE
Nitrite: NEGATIVE
PH: 6 (ref 5.0–8.0)
PROTEIN: NEGATIVE mg/dL
Specific Gravity, Urine: 1.009 (ref 1.005–1.030)

## 2015-03-04 LAB — URINE MICROSCOPIC-ADD ON
BACTERIA UA: NONE SEEN
RBC / HPF: NONE SEEN RBC/hpf (ref 0–5)

## 2015-03-04 LAB — PROTIME-INR
INR: 1.52 — ABNORMAL HIGH (ref 0.00–1.49)
Prothrombin Time: 18.4 seconds — ABNORMAL HIGH (ref 11.6–15.2)

## 2015-03-04 LAB — CULTURE, BLOOD (ROUTINE X 2)
Culture: NO GROWTH
Culture: NO GROWTH

## 2015-03-04 MED ORDER — WARFARIN SODIUM 5 MG PO TABS
5.0000 mg | ORAL_TABLET | Freq: Once | ORAL | Status: AC
  Administered 2015-03-04: 5 mg via ORAL
  Filled 2015-03-04: qty 1

## 2015-03-04 NOTE — Clinical Social Work Note (Signed)
Clinical Social Worker presented bed offers to pt's dtr, [employer]. [facility] representative notified.   Patient currently with fever and having x-ray procedure today, 12/24.  Potential Barrier to D/C: Awaiting [place] MUST PASARR pending due to patient being an [place]of[place] resident.  CSW remains available as needed.   Derenda FennelBashira Kardell Virgil, MSW, LCSWA [phone] 03/04/2015 12:18 PM

## 2015-03-04 NOTE — Progress Notes (Signed)
ANTICOAGULATION CONSULT NOTE - Follow Up Consult  Pharmacy Consult for warfarin Indication: atrial fibrillation  Allergies  Allergen Reactions  . Tape Other (See Comments)    Pulls skin off  . Vancomycin Other (See Comments)    Hearing loss    Patient Measurements: Height: 5\' 10"  (177.8 cm) Weight: 180 lb 6.4 oz (81.829 kg) IBW/kg (Calculated) : 73  Vital Signs: Temp: 99.6 F (37.6 C) (12/24 1019) Temp Source: Oral (12/24 1019) BP: 124/72 mmHg (12/24 1019) Pulse Rate: 85 (12/24 1019)  Labs:  Recent Labs  03/02/15 0332 03/02/15 0922 03/03/15 0402 03/04/15 0503  HGB  --  10.0*  --   --   HCT  --  31.4*  --   --   PLT  --  186  --   --   LABPROT 27.5*  --  23.2* 18.4*  INR 2.60*  --  2.07* 1.52*  CREATININE  --   --  1.51*  --     Estimated Creatinine Clearance: 33.6 mL/min (by C-G formula based on Cr of 1.51).   Assessment: 81 y o M admitted 02/25/2015 with severe back pain. Patient is on Coumadin PTA for Afib ( Home Coumadin dose: 6 mg daily, except 3mg  on Tuesday and Saturday) INR therapeutic on admission, however d/t large INR jumps and missed doses- INR has been difficult to maintain therapeutic during this admission, may need to consider alternative dosing regimen upon patient discharge.  INR today is 1.52. No s/sx of bleeding noted.  Goal of Therapy:  INR 2-3 Monitor platelets by anticoagulation protocol: Yes   Plan:  - Give warfarin 5mg  x1 tonight - Monitor daily INR, CBC q72h  - follow for s/sx of bleeding  Sharron Simpson D. Shandon Matson, PharmD, BCPS Clinical Pharmacist Pager: [phone] 03/04/2015 10:31 AM

## 2015-03-04 NOTE — Progress Notes (Signed)
Patient Demographics  Ethan Lawrence, is a 81 y.o. male, DOB - 1922/11/23, ZOX:[id number]  Admit date - 02/25/2015   Admitting Physician Ron Parker, MD  Outpatient Primary MD for the patient is PROVIDER NOT IN SYSTEM  LOS - 6   Chief Complaint  Patient presents with  . Back Pain       Admission HPI/Brief narrative: 80 yo very functional male history of CAD, CHF,Chronic Atrial Fib on Coumadin Rx, AV Block S/P Pacemaker, AS, AAA S/P Repair, HTN, admitted with back pain progressive over the past 2-3 days and febrile illness for a week. He has been running a fever nightly for the past week, found to have fever of 103 on admission. [name] blood cultures are growing streptococcus beta evidence and ID was consulted. He underwent a TEE which showed  endocarditis. He has a very tender right sided inguinal hernia and surgery evaluated without need for acute interventions,  complaining of lower back pain , possible discitis.  Subjective:   [name] today has, No headache, No chest pain, No abdominal pain - No Nausea, still continues off lower back pain , episode of fever 100.5 yesterday . 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 - Blood culture growing Streptococcus  viridans - Sepsis secondary to strep viridans bacteremia/endocarditis, questionable discitis, unable to obtain MRI lumbar spine secondary to pacemaker, unable to obtain CT lumbar spine with contrast secondary to chronic kidney disease. -Patient with fever 100.5 yesterday, recheck CBC in a.m., will check 2 view chest x-ray, will check urinalysis .  Strep Viridans bacteremia/ Endocarditis  - TEE on 12/20 showing a 8 mm x 4 mm mobile target on  mitral valve and  prosthetic aortic valve sewing ring. No vegetations were noted on the pacer lead. - ID input greatly appreciated, plan to continue with IV antibiotics for 8 weeks. - IV antibiotic has been narrowed to Rocephin 2 g IV daily. - Discussed with CT surgery Dr. Morton Peters, currently patient is not a surgical candidate - Discussed with Dr. Kristin Bruins regarding finding of possible periapical abscess on panoramic view, he does not find clinical evidence for this on his reading and evaluation. - PICC linecertainly the 12/23   New onset severe lower back pain - CT lumbar spine without contrast with no evidence of discitis, but high clinical suspicion, patient will be treated with 8 weeks of IV antibiotics for his endocarditis which should treat this as well. - continue with pain medicine as needed - PT following   Inguinal hernia - general surgery consulted given possible right inguinal hernia and tenderness in that  area, defer any intervention for now   Anemia - chronic, stable  Chronic A fib -On warfarin, will hold for possible need for dental procedure  CAD s/p CABG - stable, no chest pain  AV block s/p pacemaker  HTN - continue home medications  Acute on chronic diastolic heart failure - some vascular congestion on CXR, on room air comfortable in the room , will resume Lasix given worsening lower extremity edema.   CKD stage III - Cr close to baseline, received IV contrast, closely monitor - Cr stable   Thrombocytopenia -  chronic, stable  Code Status: Full  Family Communication: D/W daughter at bedside  Disposition Plan: SNF when medically stable.   Procedures    TEE Impressions: - There is a 8 mm x 4 mm mobile target on the atrial side of themitral valve at the region of the P3 segment that is concerning for endocarditis given the clinical scenario. Cannot rule outruptured cord. There is no associated valve destruction and themitral regurgitation appears to be separate from the suspectedlesion. There is also a mobile target on the superior surface of the prosthetic aortic valve sewing ring. The aortic valve gradients are moderately elevated, consistent with moderate steonis, asseen on transthoracic echo. The valve is well-seated and there isno rocking motion. There is no aortic reguritation.No vegetations were noted on the pacer lead.   Consults   Infectious disease Gen. surgery Cardiology via phone CT surgery Dr. Morton Peters via phone   Medications  Scheduled Meds: . cefTRIAXone (ROCEPHIN)  IV  2 g Intravenous Q24H  . clotrimazole   Topical BID  . diclofenac  1 patch Transdermal BID  . docusate sodium  100 mg Oral BID  . folic acid  1 mg Oral Daily  . furosemide  20 mg Oral Daily  . gabapentin  300 mg Oral BID  . multivitamin-lutein  1 capsule Oral BID  . pantoprazole  40 mg Oral Daily  . predniSONE  5 mg Oral Q breakfast  . rOPINIRole  1 mg Oral Daily  . simvastatin  20 mg Oral QHS  . sodium chloride  3 mL Intravenous Q12H  . warfarin  5 mg Oral ONCE-1800  . Warfarin - Pharmacist Dosing Inpatient   Does not apply q1800   Continuous Infusions:  PRN Meds:.sodium chloride, acetaminophen **OR** acetaminophen, alum & mag hydroxide-simeth, HYDROmorphone (DILAUDID) injection, HYDROmorphone (DILAUDID) injection, methocarbamol, ondansetron **OR** ondansetron (ZOFRAN) IV, oxyCODONE, oxyCODONE, sodium chloride, sodium chloride  DVT Prophylaxis  warfarin  Lab Results  Component  Value Date   PLT 186 03/02/2015    Antibiotics    Anti-infectives    Start     Dose/Rate Route Frequency Ordered Stop   02/28/15 1200  cefTRIAXone (ROCEPHIN) 2 g in dextrose 5 % 50 mL IVPB     2 g 100 mL/hr over 30 Minutes Intravenous Every 24 hours 02/28/15 1055     02/27/15 2000  Ampicillin-Sulbactam (UNASYN) 3 g in sodium chloride 0.9 % 100 mL IVPB  Status:  Discontinued     3 g 100 mL/hr over 60 Minutes Intravenous Every 12 hours 02/27/15 1931 02/28/15 1054   02/27/15 0600  vancomycin (VANCOCIN) IVPB 1000 mg/200 mL premix  Status:  Discontinued     1,000 mg 200 mL/hr over 60 Minutes Intravenous Every 24 hours 02/26/15 0706 02/28/15 1054   02/26/15 1500  piperacillin-tazobactam (ZOSYN) IVPB 3.375 g  Status:  Discontinued     3.375 g 12.5 mL/hr over 240 Minutes Intravenous 3 times per day  02/26/15 0706 02/27/15 1859   02/26/15 0730  piperacillin-tazobactam (ZOSYN) IVPB 3.375 g     3.375 g 100 mL/hr over 30 Minutes Intravenous STAT 02/26/15 0706 02/26/15 1105   02/26/15 0530  vancomycin (VANCOCIN) 1,250 mg in sodium chloride 0.9 % 250 mL IVPB     1,250 mg 166.7 mL/hr over 90 Minutes Intravenous  Once 02/26/15 0517 02/26/15 0712   02/25/15 2045  cefTRIAXone (ROCEPHIN) 2 g in dextrose 5 % 50 mL IVPB     2 g 100 mL/hr over 30 Minutes Intravenous  Once 02/25/15 2040 02/25/15 2244          Objective:   Filed Vitals:   03/03/15 1832 03/03/15 2156 03/04/15 0516 03/04/15 1019  BP: 102/53 117/62 161/72 124/72  Pulse: 90 87 97 85  Temp: 100.5 F (38.1 C) 99.1 F (37.3 C) 98.3 F (36.8 C) 99.6 F (37.6 C)  TempSrc: Oral Oral Oral Oral  Resp: 20 20 20 20   Height:      Weight:      SpO2: 97% 95% 98% 93%    Wt Readings from Last 3 Encounters:  02/26/15 81.829 kg (180 lb 6.4 oz)  02/23/15 77.656 kg (171 lb 3.2 oz)  02/21/15 78.291 kg (172 lb 9.6 oz)     Intake/Output Summary (Last 24 hours) at 03/04/15 1130 Last data filed at 03/04/15 1004  Gross per 24 hour  Intake       0 ml  Output    975 ml  Net   -975 ml     Physical Exam  Awake Alert, Oriented X 3,  [place].AT,PERRAL Supple Neck,No JVD,  Symmetrical Chest wall movement, Good air movement bilaterally, CTAB irregular, murmur +, No Gallops,Rubs , No Parasternal Heave +ve B.Sounds, Abd Soft, No tenderness, No organomegaly appriciated, No rebound - guarding or rigidity. No Cyanosis, Clubbing , No new Rash or bruise     Data Review   Micro Results Recent Results (from the past 240 hour(s))  Blood Culture (routine x 2)     Status: None   Collection Time: 02/25/15  8:45 PM  Result Value Ref Range Status   Specimen Description BLOOD RIGHT FOREARM  Final   Special Requests BOTTLES DRAWN AEROBIC AND ANAEROBIC 5C  Final   Culture  Setup Time   Final    GRAM POSITIVE COCCI IN PAIRS CRITICAL RESULT CALLED TO, READ BACK BY AND VERIFIED WITH: K BREINICH 02/26/15 @ 1209 M VESTAL IN BOTH AEROBIC AND ANAEROBIC BOTTLES    Culture   Final    VIRIDANS STREPTOCOCCUS Performed at [facility]    Report Status 02/28/2015 FINAL  Final   Organism ID, Bacteria VIRIDANS STREPTOCOCCUS  Final      Susceptibility   Viridans streptococcus - MIC*    ERYTHROMYCIN <=0.12 SENSITIVE Sensitive     TETRACYCLINE >=16 RESISTANT Resistant     VANCOMYCIN 0.5 SENSITIVE Sensitive     CLINDAMYCIN <=0.25 SENSITIVE Sensitive     PENICILLIN <=0.06 SENSITIVE Sensitive     CEFTRIAXONE <=0.12 SENSITIVE Sensitive     LEVOFLOXACIN 2 SENSITIVE Sensitive     * VIRIDANS STREPTOCOCCUS  Blood Culture (routine x 2)     Status: None   Collection Time: 02/25/15  9:15 PM  Result Value Ref Range Status   Specimen Description BLOOD LEFT FOREARM  Final   Special Requests BOTTLES DRAWN AEROBIC AND ANAEROBIC 5CC  Final   Culture  Setup Time   Final    GRAM POSITIVE COCCI IN PAIRS  IN BOTH AEROBIC AND ANAEROBIC BOTTLES CRITICAL RESULT CALLED TO, READ BACK BY AND VERIFIED WITH: K BREINICH 02/26/15 @ 1209 M VESTAL    Culture   Final     VIRIDANS STREPTOCOCCUS SUSCEPTIBILITIES PERFORMED ON PREVIOUS CULTURE WITHIN THE LAST 5 DAYS. Performed at [facility]    Report Status 02/28/2015 FINAL  Final  Urine culture     Status: None   Collection Time: 02/25/15 10:59 PM  Result Value Ref Range Status   Specimen Description URINE, CLEAN CATCH  Final   Special Requests NONE  Final   Culture   Final    8,000 COLONIES/mL INSIGNIFICANT GROWTH Performed at [facility]    Report Status 02/27/2015 FINAL  Final  Culture, blood (Routine X 2) w Reflex to ID Panel     Status: None (Preliminary result)   Collection Time: 02/27/15  6:45 PM  Result Value Ref Range Status   Specimen Description BLOOD LEFT ARM  Final   Special Requests BOTTLES DRAWN AEROBIC AND ANAEROBIC 5CC  Final   Culture NO GROWTH 4 DAYS  Final   Report Status PENDING  Incomplete  Culture, blood (Routine X 2) w Reflex to ID Panel     Status: None (Preliminary result)   Collection Time: 02/27/15  6:50 PM  Result Value Ref Range Status   Specimen Description BLOOD LEFT HAND  Final   Special Requests IN PEDIATRIC BOTTLE 1CC  Final   Culture NO GROWTH 4 DAYS  Final   Report Status PENDING  Incomplete    Radiology Reports Dg Orthopantogram  02/28/2015  CLINICAL DATA:  Jaw pain EXAM: ORTHOPANTOGRAM/PANORAMIC COMPARISON:  None. FINDINGS: The patient is predominantly edentulous. On the left adjacent to the root of the second mandibular bicuspid there is a circular lucency identified suggestive of periapical abscess. IMPRESSION: Likely periapical abscess on the left in the mandible adjacent to the root of the second bicuspid. Electronically Signed   By: Alcide Clever M.D.   On: 02/28/2015 14:13   Dg Chest 1 View  02/25/2015  CLINICAL DATA:  81 year old male with back pain for 6 hours. EXAM: CHEST 1 VIEW COMPARISON:  Chest radiograph 01/20/2013. Included lung bases from CT abdomen/pelvis performed concurrently. FINDINGS: Single lead left-sided pacemaker remains  in place. Patient is post median sternotomy with 2 prosthetic valves. Multi chamber cardiomegaly, progressed from prior exam. There is tortuosity and atherosclerosis of the thoracic aorta. Vascular congestion with possible mild perihilar edema. Calcified granuloma in the right upper lung. Small pleural effusions on CT are not seen radiographically. No confluent airspace disease. No pneumothorax. IMPRESSION: Cardiomegaly with vascular congestion and questionable perihilar edema. Recommend correlation for CHF. Electronically Signed   By: Rubye Oaks M.D.   On: 02/25/2015 22:30   Dg Thoracic Spine 2 View  02/25/2015  CLINICAL DATA:  Lower back pain since 4:30 this afternoon. History of chronic back pain. No injury noted. EXAM: THORACIC SPINE 2 VIEWS COMPARISON:  Chest x-ray dated 01/20/2013. FINDINGS: Portions of the thoracic spine are incompletely seen due to overlying osseous and soft tissue structures. Overall osseous alignment appears normal and grossly stable compared to a previous chest x-ray of 01/20/2013. No osseous fracture or dislocation seen. No acute - appearing cortical irregularity or osseous lesion. Paravertebral soft tissues are unremarkable for acute process. IMPRESSION: No acute findings. Electronically Signed   By: Bary Richard M.D.   On: 02/25/2015 22:29   Dg Lumbar Spine Complete  02/25/2015  CLINICAL DATA:  81 year old male with lumbosacral back  pain for 6 hours. EXAM: LUMBAR SPINE - COMPLETE 4+ VIEW COMPARISON:  Reformats from CT abdomen/ pelvis 09/02/2011. FINDINGS: Kyphoplasty within L4 compression deformity. Remaining vertebral body heights are maintained. Mild levoscoliosis centered at L2-L3 with associated degenerative disc disease. Additional disc space narrowing at L1-L2. The bones are under mineralized. Aorto bi-iliac stent in place. IMPRESSION: 1. Kyphoplasty within L4 compression deformity. 2. Scoliosis and degenerative change.  No acute bony abnormality. Electronically  Signed   By: Rubye OaksMelanie  Ehinger M.D.   On: 02/25/2015 22:26   Ct Lumbar Spine Wo Contrast  02/28/2015  CLINICAL DATA:  Several day history of low back pain. EXAM: CT LUMBAR SPINE WITHOUT CONTRAST TECHNIQUE: Multidetector CT imaging of the lumbar spine was performed without intravenous contrast administration. Multiplanar CT image reconstructions were also generated. COMPARISON:  CT scan 02/25/2015 FINDINGS: Remote vertebral augmentation changes at L4 a with a significant compression deformity. Mild stable retropulsion and mild canal encroachment. New line the other lumbar vertebral bodies are maintained. No acute fractures identified. S1 is not covered on this examination. Stable advanced degenerative disc disease at L3-4 along with advanced facet disease at L3-4 and L4-5. There are moderate-sized bilateral pleural effusions noted along with overlying basilar atelectasis. Stable aortoiliac stent graft. No complicating features are demonstrated. L1-2: Broad-based bulging annulus and osteophytic ridging with mild left lateral recess and left foraminal encroachment. This could irritate the left L1 nerve root. L2-3: Advanced degenerative disc disease and facet disease with a bulging annulus and osteophytic ridging. Mild flattening of the ventral thecal sac but no significant spinal or foraminal stenosis. L3-4: Wide decompressive laminectomy and vertebral augmentation changes. No significant spinal stenosis. Right-sided disc osteophyte complex with significant right foraminal stenosis likely effecting the right L3 nerve root. L4-5: Bulging annulus, osteophytic ridging and facet disease with mild spinal and bilateral lateral recess stenosis. No significant foraminal stenosis. L5-S1:  Bulging annulus but no spinal or foraminal stenosis. IMPRESSION: 1. Remote L4 compression fracture with vertebral augmentation changes. 2. No new/acute fractures are identified. 3. Mild left lateral recess and left foraminal stenosis at  L1-2. 4. Right foraminal/extra foraminal disc osteophyte complex on the right at L3-4 likely effecting the right L3 nerve root. 5. Mild spinal and bilateral lateral recess stenosis at L4-5. 6. Bilateral moderate-sized pleural effusions. Electronically Signed   By: Rudie MeyerP.  Gallerani M.D.   On: 02/28/2015 18:40   Ct Abdomen Pelvis W Contrast  02/25/2015  CLINICAL DATA:  Severe low back pain with any movement. History of chronic back pain. Abdominal pain and tenderness. EXAM: CT ABDOMEN AND PELVIS WITH CONTRAST TECHNIQUE: Multidetector CT imaging of the abdomen and pelvis was performed using the standard protocol following bolus administration of intravenous contrast. CONTRAST:  70mL OMNIPAQUE IOHEXOL 300 MG/ML  SOLN COMPARISON:  09/02/2011 FINDINGS: Small bilateral pleural effusions. Multiple surgical clips at the EG junction. Postoperative changes in the mediastinum. Surgical absence of the gallbladder. Calcified granulomas scattered throughout the liver and spleen. No hepatosplenomegaly. The pancreas, adrenal glands, inferior vena cava, and retroperitoneal lymph nodes are unremarkable. Abdominal aortic aneurysm post aortoiliac stent grafts. Native sac diameter is 5.6 cm. This measurement is similar to the previous study. Kidneys are atrophic bilaterally. Renal nephrograms appear symmetrical. No hydronephrosis. Large cysts in both kidneys, largest is off of the lower pole of the left kidney and measures about 8.9 cm diameter. This cyst is increased in size since the previous study. The stomach, small bowel, and colon are not abnormally distended. The colon is diffusely stool-filled. No free air  or free fluid in the abdomen. Surgical clips in the anterior abdominal wall. Pelvis: Prostate gland is not enlarged. Prostate defect may indicate previous TUR procedure. Bladder wall is not thickened. Appendix is surgically absent. No free or loculated pelvic fluid collections. No pelvic mass or lymphadenopathy. Vascular  calcifications in the pelvis. Fat in the inguinal canals. Degenerative changes in the lumbar spine. L4 compression deformity post kyphoplasty. No destructive bone lesions. IMPRESSION: No significant acute process demonstrated in the abdomen or pelvis. There are small bilateral pleural effusions. Postoperative changes demonstrated throughout the chest, abdomen, and pelvis. Abdominal aortic aneurysm post stent graft. Native sac diameter is 5.6 cm without change since prior study. Multiple renal cysts with enlarging large cyst on the left kidney. Electronically Signed   By: Burman Nieves M.D.   On: 02/25/2015 22:42   Dg Chest Port 1 View  03/03/2015  CLINICAL DATA:  Bedside PICC placement. EXAM: PORTABLE CHEST 1 VIEW COMPARISON:  02/25/2015 and earlier. FINDINGS: Right arm PICC tip projects over the lower SVC near the cavoatrial junction. Prior sternotomy for CABG, aortic valve replacement and mitral valve replacement or annuloplasty. Stable moderate to marked cardiomegaly. 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 Calcified granuloma in the right upper lobe. Lungs otherwise clear. No pneumothorax. No visible pleural effusions. 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 IMPRESSION: 1. Right arm PICC tip projects over the lower SVC near the cavoatrial junction. 2. Stable moderate to marked cardiomegaly. No acute cardiopulmonary disease. Electronically Signed   By: Hulan Saas M.D.   On: 03/03/2015 09:33   Ct Maxillofacial Ltd Wo Cm  02/28/2015  CLINICAL DATA:  81 year old male with sinusitis and bacteremia. EXAM: CT PARANASAL SINUS LIMITED WITHOUT CONTRAST TECHNIQUE: Non-contiguous multidetector CT images of the paranasal sinuses were obtained in a single plane without contrast. COMPARISON:  None. FINDINGS: There is no acute fracture of the visualized facial bones. Bilateral cataract surgeries noted. The globes are otherwise intact. The retro-orbital fat and orbital walls are  preserved. The paranasal sinuses are well aerated. No significant mucoperiosteal thickening. No air-fluid level. No soft tissue swelling noted. No acute hemorrhage identified within the visualized brain. IMPRESSION: Unremarkable CT of the paranasal sinuses. Electronically Signed   By: Elgie Collard M.D.   On: 02/28/2015 18:31     CBC  Recent Labs Lab 02/25/15 2045 02/27/15 0750 02/27/15 1049 03/01/15 0614 03/02/15 0922  WBC 5.4 7.0 7.8 6.2 6.5  HGB 9.3* 9.2* 9.1* 9.2* 10.0*  HCT 29.3* 30.0* 28.9* 29.4* 31.4*  PLT 102* 122* 120* 150 186  MCV 91.8 93.8 92.0 93.6 92.1  MCH 29.2 28.8 29.0 29.3 29.3  MCHC 31.7 30.7 31.5 31.3 31.8  RDW 13.5 13.6 13.6 13.5 13.4  LYMPHSABS 0.6*  --   --   --   --   MONOABS 0.4  --   --   --   --   EOSABS 0.0  --   --   --   --   BASOSABS 0.0  --   --   --   --     Chemistries   Recent Labs Lab 02/25/15 2045 02/27/15 0750 03/01/15 0614 03/03/15 0402  NA 139 138 139 139  K 4.3 4.5 4.4 3.5  CL 108 110 106 104  CO2 23 20* 24 25  GLUCOSE 118* 101* 98 99  BUN 32* 26* 18 20  CREATININE 1.75* 1.73* 1.48* 1.51*  CALCIUM 8.1* 8.9 9.0 8.7*  AST 17  --   --   --  ALT 10*  --   --   --   ALKPHOS 86  --   --   --   BILITOT 0.8  --   --   --    ------------------------------------------------------------------------------------------------------------------ estimated creatinine clearance is 33.6 mL/min (by C-G formula based on Cr of 1.51). ------------------------------------------------------------------------------------------------------------------ No results for input(s): HGBA1C in the last 72 hours. ------------------------------------------------------------------------------------------------------------------ No results for input(s): CHOL, HDL, LDLCALC, TRIG, CHOLHDL, LDLDIRECT in the last 72 hours. ------------------------------------------------------------------------------------------------------------------ No results for input(s):  TSH, T4TOTAL, T3FREE, THYROIDAB in the last 72 hours.  Invalid input(s): FREET3 ------------------------------------------------------------------------------------------------------------------ No results for input(s): VITAMINB12, FOLATE, FERRITIN, TIBC, IRON, RETICCTPCT in the last 72 hours.  Coagulation profile  Recent Labs Lab 02/28/15 0743 03/01/15 0614 03/02/15 0332 03/03/15 0402 03/04/15 0503  INR 2.75* 2.29* 2.60* 2.07* 1.52*    No results for input(s): DDIMER in the last 72 hours.  Cardiac Enzymes No results for input(s): CKMB, TROPONINI, MYOGLOBIN in the last 168 hours.  Invalid input(s): CK ------------------------------------------------------------------------------------------------------------------ Invalid input(s): POCBNP     Time Spent in minutes   20 minutes    Dishon Kehoe M.D on 03/04/2015 at 11:30 AM  Between 7am to 7pm - Pager - [phone]  After 7pm go to [email or web address] - password [facility]  [facility]   Office  [phone]

## 2015-03-04 NOTE — Progress Notes (Signed)
Attempted to transfer pt with assist to the chair but was not successful. Pt in too much pain, screaming out. Administered pain medication prior to attempt. Will try again when pt returns from Xray.

## 2015-03-05 LAB — PROTIME-INR
INR: 1.41 (ref 0.00–1.49)
PROTHROMBIN TIME: 17.4 s — AB (ref 11.6–15.2)

## 2015-03-05 LAB — CBC
HCT: 29 % — ABNORMAL LOW (ref 39.0–52.0)
HEMOGLOBIN: 9.1 g/dL — AB (ref 13.0–17.0)
MCH: 28.9 pg (ref 26.0–34.0)
MCHC: 31.4 g/dL (ref 30.0–36.0)
MCV: 92.1 fL (ref 78.0–100.0)
PLATELETS: 205 10*3/uL (ref 150–400)
RBC: 3.15 MIL/uL — ABNORMAL LOW (ref 4.22–5.81)
RDW: 13.4 % (ref 11.5–15.5)
WBC: 5.4 10*3/uL (ref 4.0–10.5)

## 2015-03-05 LAB — BASIC METABOLIC PANEL
ANION GAP: 9 (ref 5–15)
BUN: 18 mg/dL (ref 6–20)
CALCIUM: 8.8 mg/dL — AB (ref 8.9–10.3)
CO2: 28 mmol/L (ref 22–32)
Chloride: 101 mmol/L (ref 101–111)
Creatinine, Ser: 1.57 mg/dL — ABNORMAL HIGH (ref 0.61–1.24)
GFR calc Af Amer: 43 mL/min — ABNORMAL LOW (ref >60)
GFR, EST NON AFRICAN AMERICAN: 37 mL/min — AB (ref >60)
GLUCOSE: 99 mg/dL (ref 65–99)
Potassium: 3.3 mmol/L — ABNORMAL LOW (ref 3.5–5.1)
Sodium: 138 mmol/L (ref 135–145)

## 2015-03-05 MED ORDER — WARFARIN SODIUM 6 MG PO TABS
6.0000 mg | ORAL_TABLET | Freq: Once | ORAL | Status: AC
  Administered 2015-03-05: 6 mg via ORAL
  Filled 2015-03-05: qty 1

## 2015-03-05 NOTE — Progress Notes (Signed)
Nurse tech attempted to help pt to sit in chair. Pt refused stating he was in too much pain. PRN pain medication, Dilaudid, administered. Pt states will get up to chair in 15 minutes to eat dinner. Lawson RadarHeather M Sharmon Cheramie

## 2015-03-05 NOTE — Progress Notes (Signed)
ANTICOAGULATION CONSULT NOTE - Follow Up Consult  Pharmacy Consult for warfarin Indication: atrial fibrillation  Allergies  Allergen Reactions  . Tape Other (See Comments)    Pulls skin off  . Vancomycin Other (See Comments)    Hearing loss    Patient Measurements: Height: 5\' 10"  (177.8 cm) Weight: 180 lb 6.4 oz (81.829 kg) IBW/kg (Calculated) : 73  Vital Signs: Temp: 98.9 F (37.2 C) (12/25 0602) Temp Source: Oral (12/25 0602) BP: 138/65 mmHg (12/25 0602) Pulse Rate: 69 (12/25 0602)  Labs:  Recent Labs  03/02/15 0922 03/03/15 0402 03/04/15 0503 03/05/15 0445  HGB 10.0*  --   --  9.1*  HCT 31.4*  --   --  29.0*  PLT 186  --   --  205  LABPROT  --  23.2* 18.4* 17.4*  INR  --  2.07* 1.52* 1.41  CREATININE  --  1.51*  --  1.57*    Estimated Creatinine Clearance: 32.3 mL/min (by C-G formula based on Cr of 1.57).   Assessment: 81 y o M admitted 02/25/2015 with severe back pain. Patient is on Coumadin PTA for Afib ( Home Coumadin dose: 6 mg daily, except 3mg  on Tuesday and Saturday) INR therapeutic on admission, however d/t large INR jumps and missed doses- INR has been difficult to maintain therapeutic during this admission, may need to consider alternative dosing regimen upon patient discharge.  INR today is 1.41. No s/sx of bleeding noted.  Goal of Therapy:  INR 2-3 Monitor platelets by anticoagulation protocol: Yes   Plan:  - Give warfarin 6mg  x1 tonight - Monitor daily INR, CBC q72h  - follow for s/sx of bleeding  Thank you Okey RegalLisa Brette Cast, PharmD [phone] 03/05/2015 8:31 AM

## 2015-03-05 NOTE — Progress Notes (Signed)
Pt needed to have a BM and staff encouraged pt to get up to the bedside commode.  Pt refused stating that he just wanted to use a bedpan.  Staff educated pt on the importance of ambulation in the healing process.  Pt ultimately complied with staff and was helped to the bedside commode.  Pt given IV pain medication prior to ambulation.  During movement from laying to seated to standing position pt screamed out in pain several times.  Pt stated that he will not be able to continue to get up and move until Dr Ethan Lawrence finally operates on his back.  Will continue to monitor pts severe back pain.

## 2015-03-05 NOTE — Progress Notes (Signed)
Patient Demographics  Ethan Lawrence, is a 81 y.o. male, DOB - 02-Dec-1922, ZOX:[id number]  Admit date - 02/25/2015   Admitting Physician Ethan Parker, MD  Outpatient Primary MD for the patient is PROVIDER NOT IN SYSTEM  LOS - 7   Chief Complaint  Patient presents with  . Back Pain       Admission HPI/Brief narrative: 81 yo very functional male history of CAD, CHF,Chronic Atrial Fib on Coumadin Rx, AV Block S/P Pacemaker, AS, AAA S/P Repair, HTN, admitted with back pain progressive over the past 2-3 days and febrile illness for a week. He has been running a fever nightly for the past week, found to have fever of 103 on admission. [name] blood cultures are growing streptococcus beta evidence and ID was consulted. He underwent a TEE which showed  endocarditis. He has a very tender right sided inguinal hernia and surgery evaluated without need for acute interventions,  complaining of lower back pain , possible discitis.  Subjective:   Ethan Lawrence today has, No headache, No chest pain, No abdominal pain - No Nausea, still continues off lower back pain , no recurrence of fever. Had 2 episodes of diarrhea. 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 - Blood culture growing  Streptococcus viridans - Sepsis secondary to strep viridans bacteremia/endocarditis, questionable discitis, unable to obtain MRI lumbar spine secondary to pacemaker, unable to obtain CT lumbar spine with contrast secondary to chronic kidney disease. - Fever 100.5 on 12/23 leukocytosis, negative urinalysis, no acute cardiopulmonary disease on chest x-ray, no recurrence of fever.  Strep Viridans bacteremia/ Endocarditis  - TEE on 12/20 showing a 8 mm x 4 mm mobile target on  mitral valve and  prosthetic aortic valve sewing ring. No vegetations were noted on the pacer lead. - ID input greatly appreciated, plan to continue with IV antibiotics for 8 weeks. - IV antibiotic has been narrowed to Rocephin 2 g IV daily. - Discussed with CT surgery Dr. Morton Lawrence, currently patient is not a surgical candidate - Discussed with Dr. Kristin Lawrence regarding finding of possible periapical abscess on panoramic view, he does not find clinical evidence for this on his reading and evaluation. - PICC lineceted  12/23   New onset severe lower back pain - CT lumbar spine without contrast with no evidence of discitis, but high clinical suspicion, patient will be treated with 8 weeks of IV antibiotics for his endocarditis which should treat this as well. - She continues to have significant lower back pain, renal function remained stable, so we'll check CT lumbar spine with IV contrast, MRI can't  be done secondary to pacemaker. - continue with pain medicine as needed - PT following  Inguinal hernia - general surgery consulted given possible right inguinal hernia and tenderness in that area, defer any intervention for now   Anemia - chronic, stable  Chronic A fib -On warfarin, dosed by pharmacy  CAD s/p CABG - stable, no chest pain  AV block s/p pacemaker  HTN - continue home medications  Acute on chronic diastolic heart failure - some vascular congestion on CXR, on room air comfortable in the room , will resume  Lasix given worsening lower extremity edema.   CKD stage III - Cr close to baseline, received IV contrast, closely monitor - Cr stable   Thrombocytopenia - chronic, stable  Diarrhea - Will discontinue laxatives Code Status: Full  Family Communication: D/W daughter at bedside  Disposition Plan: SNF when [facility] and bed available.   Procedures    TEE Impressions: - There is a 8 mm x 4 mm mobile target on the atrial side of themitral valve at the region of the P3 segment that is concerning for endocarditis given the clinical scenario. Cannot rule outruptured cord. There is no associated valve destruction and themitral regurgitation appears to be separate from the suspectedlesion. There is also a mobile target on the superior surface of the prosthetic aortic valve sewing ring. The aortic valve gradients are moderately elevated, consistent with moderate steonis, asseen on transthoracic echo. The valve is well-seated and there isno rocking motion. There is no aortic reguritation.No vegetations were noted on the pacer lead.   Consults   Infectious disease Gen. surgery Cardiology via phone CT surgery Dr. Morton Lawrence via phone   Medications  Scheduled Meds: . cefTRIAXone (ROCEPHIN)  IV  2 g Intravenous Q24H  . clotrimazole   Topical BID  . diclofenac  1 patch Transdermal BID  . folic acid  1 mg Oral Daily  . furosemide  20 mg Oral Daily  . gabapentin  300 mg Oral BID  . multivitamin-lutein  1 capsule Oral BID  . pantoprazole  40 mg Oral Daily  . predniSONE  5 mg Oral Q breakfast  . rOPINIRole  1 mg Oral Daily  . simvastatin  20 mg Oral QHS  . sodium chloride  3 mL Intravenous Q12H  . warfarin  6 mg Oral ONCE-1800  . Warfarin - Pharmacist Dosing Inpatient   Does not apply q1800   Continuous Infusions:  PRN Meds:.sodium chloride, acetaminophen **OR** acetaminophen, alum & mag hydroxide-simeth, HYDROmorphone (DILAUDID) injection, HYDROmorphone (DILAUDID)  injection, methocarbamol, ondansetron **OR** ondansetron (ZOFRAN) IV, oxyCODONE, oxyCODONE, sodium chloride, sodium chloride  DVT Prophylaxis  warfarin  Lab Results  Component Value Date   PLT 205 03/05/2015    Antibiotics    Anti-infectives    Start     Dose/Rate Route Frequency Ordered Stop   02/28/15 1200  cefTRIAXone (ROCEPHIN) 2 g in dextrose 5 % 50 mL IVPB     2 g 100 mL/hr over 30 Minutes Intravenous Every 24 hours 02/28/15 1055     02/27/15 2000  Ampicillin-Sulbactam (UNASYN) 3 g in sodium chloride 0.9 % 100 mL IVPB  Status:  Discontinued     3 g 100 mL/hr over 60 Minutes Intravenous Every 12 hours 02/27/15 1931 02/28/15 1054   02/27/15 0600  vancomycin (VANCOCIN) IVPB 1000 mg/200 mL premix  Status:  Discontinued     1,000 mg 200 mL/hr over 60 Minutes Intravenous Every 24 hours 02/26/15 0706 02/28/15 1054   02/26/15 1500  piperacillin-tazobactam (  ZOSYN) IVPB 3.375 g  Status:  Discontinued     3.375 g 12.5 mL/hr over 240 Minutes Intravenous 3 times per day 02/26/15 0706 02/27/15 1859   02/26/15 0730  piperacillin-tazobactam (ZOSYN) IVPB 3.375 g     3.375 g 100 mL/hr over 30 Minutes Intravenous STAT 02/26/15 0706 02/26/15 1105   02/26/15 0530  vancomycin (VANCOCIN) 1,250 mg in sodium chloride 0.9 % 250 mL IVPB     1,250 mg 166.7 mL/hr over 90 Minutes Intravenous  Once 02/26/15 0517 02/26/15 0712   02/25/15 2045  cefTRIAXone (ROCEPHIN) 2 g in dextrose 5 % 50 mL IVPB     2 g 100 mL/hr over 30 Minutes Intravenous  Once 02/25/15 2040 02/25/15 2244          Objective:   Filed Vitals:   03/05/15 0034 03/05/15 0118 03/05/15 0602 03/05/15 1011  BP: 143/65 160/72 138/65 85/58  Pulse: 74 85 69 70  Temp: 98.5 F (36.9 C) 98.5 F (36.9 C) 98.9 F (37.2 C) 98.3 F (36.8 C)  TempSrc: Oral Oral Oral Oral  Resp: Height:      Weight:      SpO2: 98% 98% 97% 98%    Wt Readings from Last 3 Encounters:  02/26/15 81.829 kg (180 lb 6.4 oz)  02/23/15 77.656 kg  (171 lb 3.2 oz)  02/21/15 78.291 kg (172 lb 9.6 oz)     Intake/Output Summary (Last 24 hours) at 03/05/15 1111 Last data filed at 03/05/15 0118  Gross per 24 hour  Intake      0 ml  Output    900 ml  Net   -900 ml     Physical Exam  Awake Alert, Oriented X 3,  [place].AT,PERRAL Supple Neck,No JVD,  Symmetrical Chest wall movement, Good air movement bilaterally, CTAB irregular, murmur +, No Gallops,Rubs , No Parasternal Heave +ve B.Sounds, Abd Soft, No tenderness, No organomegaly appriciated, No rebound - guarding or rigidity. No Cyanosis, Clubbing , No new Rash or bruise     Data Review   Micro Results Recent Results (from the past 240 hour(s))  Blood Culture (routine x 2)     Status: None   Collection Time: 02/25/15  8:45 PM  Result Value Ref Range Status   Specimen Description BLOOD RIGHT FOREARM  Final   Special Requests BOTTLES DRAWN AEROBIC AND ANAEROBIC 5C  Final   Culture  Setup Time   Final    GRAM POSITIVE COCCI IN PAIRS CRITICAL RESULT CALLED TO, READ BACK BY AND VERIFIED WITH: K BREINICH 02/26/15 @ 1209 M VESTAL IN BOTH AEROBIC AND ANAEROBIC BOTTLES    Culture   Final    VIRIDANS STREPTOCOCCUS Performed at [facility]    Report Status 02/28/2015 FINAL  Final   Organism ID, Bacteria VIRIDANS STREPTOCOCCUS  Final      Susceptibility   Viridans streptococcus - MIC*    ERYTHROMYCIN <=0.12 SENSITIVE Sensitive     TETRACYCLINE >=16 RESISTANT Resistant     VANCOMYCIN 0.5 SENSITIVE Sensitive     CLINDAMYCIN <=0.25 SENSITIVE Sensitive     PENICILLIN <=0.06 SENSITIVE Sensitive     CEFTRIAXONE <=0.12 SENSITIVE Sensitive     LEVOFLOXACIN 2 SENSITIVE Sensitive     * VIRIDANS STREPTOCOCCUS  Blood Culture (routine x 2)     Status: None   Collection Time: 02/25/15  9:15 PM  Result Value Ref Range Status   Specimen Description BLOOD LEFT FOREARM  Final   Special Requests BOTTLES  DRAWN AEROBIC AND ANAEROBIC 5CC  Final   Culture  Setup Time   Final    GRAM  POSITIVE COCCI IN PAIRS IN BOTH AEROBIC AND ANAEROBIC BOTTLES CRITICAL RESULT CALLED TO, READ BACK BY AND VERIFIED WITH: K BREINICH 02/26/15 @ 1209 M VESTAL    Culture   Final    VIRIDANS STREPTOCOCCUS SUSCEPTIBILITIES PERFORMED ON PREVIOUS CULTURE WITHIN THE LAST 5 DAYS. Performed at [facility]    Report Status 02/28/2015 FINAL  Final  Urine culture     Status: None   Collection Time: 02/25/15 10:59 PM  Result Value Ref Range Status   Specimen Description URINE, CLEAN CATCH  Final   Special Requests NONE  Final   Culture   Final    8,000 COLONIES/mL INSIGNIFICANT GROWTH Performed at [facility]    Report Status 02/27/2015 FINAL  Final  Culture, blood (Routine X 2) w Reflex to ID Panel     Status: None   Collection Time: 02/27/15  6:45 PM  Result Value Ref Range Status   Specimen Description BLOOD LEFT ARM  Final   Special Requests BOTTLES DRAWN AEROBIC AND ANAEROBIC 5CC  Final   Culture NO GROWTH 5 DAYS  Final   Report Status 03/04/2015 FINAL  Final  Culture, blood (Routine X 2) w Reflex to ID Panel     Status: None   Collection Time: 02/27/15  6:50 PM  Result Value Ref Range Status   Specimen Description BLOOD LEFT HAND  Final   Special Requests IN PEDIATRIC BOTTLE 1CC  Final   Culture NO GROWTH 5 DAYS  Final   Report Status 03/04/2015 FINAL  Final    Radiology Reports Dg Orthopantogram  02/28/2015  CLINICAL DATA:  Jaw pain EXAM: ORTHOPANTOGRAM/PANORAMIC COMPARISON:  None. FINDINGS: The patient is predominantly edentulous. On the left adjacent to the root of the second mandibular bicuspid there is a circular lucency identified suggestive of periapical abscess. IMPRESSION: Likely periapical abscess on the left in the mandible adjacent to the root of the second bicuspid. Electronically Signed   By: Alcide Clever M.D.   On: 02/28/2015 14:13   Dg Chest 1 View  02/25/2015  CLINICAL DATA:  81 year old male with back pain for 6 hours. EXAM: CHEST 1 VIEW  COMPARISON:  Chest radiograph 01/20/2013. Included lung bases from CT abdomen/pelvis performed concurrently. FINDINGS: Single lead left-sided pacemaker remains in place. Patient is post median sternotomy with 2 prosthetic valves. Multi chamber cardiomegaly, progressed from prior exam. There is tortuosity and atherosclerosis of the thoracic aorta. Vascular congestion with possible mild perihilar edema. Calcified granuloma in the right upper lung. Small pleural effusions on CT are not seen radiographically. No confluent airspace disease. No pneumothorax. IMPRESSION: Cardiomegaly with vascular congestion and questionable perihilar edema. Recommend correlation for CHF. Electronically Signed   By: Rubye Oaks M.D.   On: 02/25/2015 22:30   Dg Chest 2 View  03/04/2015  CLINICAL DATA:  Fever 3 days. EXAM: CHEST  2 VIEW COMPARISON:  Radiograph 03/03/2015 FINDINGS: Left-sided pacemaker single lead overlies enlarged heart cardiac silhouette. RIGHT PICC line unchanged. Exam is lordotic. No effusion, infiltrate, pneumothorax. Flowing osteophytosis of thoracic spine noted. IMPRESSION: Cardiomegaly without acute cardiopulmonary findings. Electronically Signed   By: Genevive Bi M.D.   On: 03/04/2015 13:00   Dg Thoracic Spine 2 View  02/25/2015  CLINICAL DATA:  Lower back pain since 4:30 this afternoon. History of chronic back pain. No injury noted. EXAM: THORACIC SPINE 2 VIEWS COMPARISON:  Chest x-ray  dated 01/20/2013. FINDINGS: Portions of the thoracic spine are incompletely seen due to overlying osseous and soft tissue structures. Overall osseous alignment appears normal and grossly stable compared to a previous chest x-ray of 01/20/2013. No osseous fracture or dislocation seen. No acute - appearing cortical irregularity or osseous lesion. Paravertebral soft tissues are unremarkable for acute process. IMPRESSION: No acute findings. Electronically Signed   By: Bary Richard M.D.   On: 02/25/2015 22:29   Dg  Lumbar Spine Complete  02/25/2015  CLINICAL DATA:  81 year old male with lumbosacral back pain for 6 hours. EXAM: LUMBAR SPINE - COMPLETE 4+ VIEW COMPARISON:  Reformats from CT abdomen/ pelvis 09/02/2011. FINDINGS: Kyphoplasty within L4 compression deformity. Remaining vertebral body heights are maintained. Mild levoscoliosis centered at L2-L3 with associated degenerative disc disease. Additional disc space narrowing at L1-L2. The bones are under mineralized. Aorto bi-iliac stent in place. IMPRESSION: 1. Kyphoplasty within L4 compression deformity. 2. Scoliosis and degenerative change.  No acute bony abnormality. Electronically Signed   By: Rubye Oaks M.D.   On: 02/25/2015 22:26   Ct Lumbar Spine Wo Contrast  02/28/2015  CLINICAL DATA:  Several day history of low back pain. EXAM: CT LUMBAR SPINE WITHOUT CONTRAST TECHNIQUE: Multidetector CT imaging of the lumbar spine was performed without intravenous contrast administration. Multiplanar CT image reconstructions were also generated. COMPARISON:  CT scan 02/25/2015 FINDINGS: Remote vertebral augmentation changes at L4 a with a significant compression deformity. Mild stable retropulsion and mild canal encroachment. New line the other lumbar vertebral bodies are maintained. No acute fractures identified. S1 is not covered on this examination. Stable advanced degenerative disc disease at L3-4 along with advanced facet disease at L3-4 and L4-5. There are moderate-sized bilateral pleural effusions noted along with overlying basilar atelectasis. Stable aortoiliac stent graft. No complicating features are demonstrated. L1-2: Broad-based bulging annulus and osteophytic ridging with mild left lateral recess and left foraminal encroachment. This could irritate the left L1 nerve root. L2-3: Advanced degenerative disc disease and facet disease with a bulging annulus and osteophytic ridging. Mild flattening of the ventral thecal sac but no significant spinal or  foraminal stenosis. L3-4: Wide decompressive laminectomy and vertebral augmentation changes. No significant spinal stenosis. Right-sided disc osteophyte complex with significant right foraminal stenosis likely effecting the right L3 nerve root. L4-5: Bulging annulus, osteophytic ridging and facet disease with mild spinal and bilateral lateral recess stenosis. No significant foraminal stenosis. L5-S1:  Bulging annulus but no spinal or foraminal stenosis. IMPRESSION: 1. Remote L4 compression fracture with vertebral augmentation changes. 2. No new/acute fractures are identified. 3. Mild left lateral recess and left foraminal stenosis at L1-2. 4. Right foraminal/extra foraminal disc osteophyte complex on the right at L3-4 likely effecting the right L3 nerve root. 5. Mild spinal and bilateral lateral recess stenosis at L4-5. 6. Bilateral moderate-sized pleural effusions. Electronically Signed   By: Rudie Meyer M.D.   On: 02/28/2015 18:40   Ct Abdomen Pelvis W Contrast  02/25/2015  CLINICAL DATA:  Severe low back pain with any movement. History of chronic back pain. Abdominal pain and tenderness. EXAM: CT ABDOMEN AND PELVIS WITH CONTRAST TECHNIQUE: Multidetector CT imaging of the abdomen and pelvis was performed using the standard protocol following bolus administration of intravenous contrast. CONTRAST:  70mL OMNIPAQUE IOHEXOL 300 MG/ML  SOLN COMPARISON:  09/02/2011 FINDINGS: Small bilateral pleural effusions. Multiple surgical clips at the EG junction. Postoperative changes in the mediastinum. Surgical absence of the gallbladder. Calcified granulomas scattered throughout the liver and spleen. No hepatosplenomegaly. The  pancreas, adrenal glands, inferior vena cava, and retroperitoneal lymph nodes are unremarkable. Abdominal aortic aneurysm post aortoiliac stent grafts. Native sac diameter is 5.6 cm. This measurement is similar to the previous study. Kidneys are atrophic bilaterally. Renal nephrograms appear  symmetrical. No hydronephrosis. Large cysts in both kidneys, largest is off of the lower pole of the left kidney and measures about 8.9 cm diameter. This cyst is increased in size since the previous study. The stomach, small bowel, and colon are not abnormally distended. The colon is diffusely stool-filled. No free air or free fluid in the abdomen. Surgical clips in the anterior abdominal wall. Pelvis: Prostate gland is not enlarged. Prostate defect may indicate previous TUR procedure. Bladder wall is not thickened. Appendix is surgically absent. No free or loculated pelvic fluid collections. No pelvic mass or lymphadenopathy. Vascular calcifications in the pelvis. Fat in the inguinal canals. Degenerative changes in the lumbar spine. L4 compression deformity post kyphoplasty. No destructive bone lesions. IMPRESSION: No significant acute process demonstrated in the abdomen or pelvis. There are small bilateral pleural effusions. Postoperative changes demonstrated throughout the chest, abdomen, and pelvis. Abdominal aortic aneurysm post stent graft. Native sac diameter is 5.6 cm without change since prior study. Multiple renal cysts with enlarging large cyst on the left kidney. Electronically Signed   By: Burman NievesWilliam  Stevens M.D.   On: 02/25/2015 22:42   Dg Chest Port 1 View  03/03/2015  CLINICAL DATA:  Bedside PICC placement. EXAM: PORTABLE CHEST 1 VIEW COMPARISON:  02/25/2015 and earlier. FINDINGS: Right arm PICC tip projects over the lower SVC near the cavoatrial junction. Prior sternotomy for CABG, aortic valve replacement and mitral valve replacement or annuloplasty. Stable moderate to marked cardiomegaly. 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 Calcified granuloma in the right upper lobe. Lungs otherwise clear. No pneumothorax. No visible pleural effusions. 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 IMPRESSION: 1. Right arm PICC tip projects over the lower SVC near the cavoatrial  junction. 2. Stable moderate to marked cardiomegaly. No acute cardiopulmonary disease. Electronically Signed   By: Hulan Saashomas  Lawrence M.D.   On: 03/03/2015 09:33   Ct Maxillofacial Ltd Wo Cm  02/28/2015  CLINICAL DATA:  81 year old male with sinusitis and bacteremia. EXAM: CT PARANASAL SINUS LIMITED WITHOUT CONTRAST TECHNIQUE: Non-contiguous multidetector CT images of the paranasal sinuses were obtained in a single plane without contrast. COMPARISON:  None. FINDINGS: There is no acute fracture of the visualized facial bones. Bilateral cataract surgeries noted. The globes are otherwise intact. The retro-orbital fat and orbital walls are preserved. The paranasal sinuses are well aerated. No significant mucoperiosteal thickening. No air-fluid level. No soft tissue swelling noted. No acute hemorrhage identified within the visualized brain. IMPRESSION: Unremarkable CT of the paranasal sinuses. Electronically Signed   By: Elgie CollardArash  Radparvar M.D.   On: 02/28/2015 18:31     CBC  Recent Labs Lab 02/27/15 0750 02/27/15 1049 03/01/15 0614 03/02/15 0922 03/05/15 0445  WBC 7.0 7.8 6.2 6.5 5.4  HGB 9.2* 9.1* 9.2* 10.0* 9.1*  HCT 30.0* 28.9* 29.4* 31.4* 29.0*  PLT 122* 120* 150 186 205  MCV 93.8 92.0 93.6 92.1 92.1  MCH 28.8 29.0 29.3 29.3 28.9  MCHC 30.7 31.5 31.3 31.8 31.4  RDW 13.6 13.6 13.5 13.4 13.4    Chemistries   Recent Labs Lab 02/27/15 0750 03/01/15 0614 03/03/15 0402 03/05/15 0445  NA 138 139 139 138  K 4.5 4.4 3.5 3.3*  CL 110 106 104 101  CO2 20* 24 25 28   GLUCOSE  101* 98 99 99  BUN 26* 18 20 18   CREATININE 1.73* 1.48* 1.51* 1.57*  CALCIUM 8.9 9.0 8.7* 8.8*   ------------------------------------------------------------------------------------------------------------------ estimated creatinine clearance is 32.3 mL/min (by C-G formula based on Cr of 1.57). ------------------------------------------------------------------------------------------------------------------ No  results for input(s): HGBA1C in the last 72 hours. ------------------------------------------------------------------------------------------------------------------ No results for input(s): CHOL, HDL, LDLCALC, TRIG, CHOLHDL, LDLDIRECT in the last 72 hours. ------------------------------------------------------------------------------------------------------------------ No results for input(s): TSH, T4TOTAL, T3FREE, THYROIDAB in the last 72 hours.  Invalid input(s): FREET3 ------------------------------------------------------------------------------------------------------------------ No results for input(s): VITAMINB12, FOLATE, FERRITIN, TIBC, IRON, RETICCTPCT in the last 72 hours.  Coagulation profile  Recent Labs Lab 03/01/15 0614 03/02/15 0332 03/03/15 0402 03/04/15 0503 03/05/15 0445  INR 2.29* 2.60* 2.07* 1.52* 1.41    No results for input(s): DDIMER in the last 72 hours.  Cardiac Enzymes No results for input(s): CKMB, TROPONINI, MYOGLOBIN in the last 168 hours.  Invalid input(s): CK ------------------------------------------------------------------------------------------------------------------ Invalid input(s): POCBNP     Time Spent in minutes   20 minutes    Tammela Bales M.D on 03/05/2015 at 11:11 AM  Between 7am to 7pm - Pager - [phone]  After 7pm go to [email or web address] - password [facility]  [facility]   Office  [phone]

## 2015-03-06 ENCOUNTER — Inpatient Hospital Stay (HOSPITAL_COMMUNITY): Payer: Medicare PPO

## 2015-03-06 LAB — PROTIME-INR
INR: 1.45 (ref 0.00–1.49)
PROTHROMBIN TIME: 17.7 s — AB (ref 11.6–15.2)

## 2015-03-06 LAB — GLUCOSE, CAPILLARY: GLUCOSE-CAPILLARY: 92 mg/dL (ref 65–99)

## 2015-03-06 MED ORDER — WARFARIN SODIUM 6 MG PO TABS
6.0000 mg | ORAL_TABLET | Freq: Once | ORAL | Status: AC
  Administered 2015-03-06: 6 mg via ORAL
  Filled 2015-03-06: qty 1

## 2015-03-06 MED ORDER — IOHEXOL 300 MG/ML  SOLN
75.0000 mL | Freq: Once | INTRAMUSCULAR | Status: AC | PRN
  Administered 2015-03-06: 75 mL via INTRAVENOUS

## 2015-03-06 NOTE — Progress Notes (Signed)
Patient Demographics  Ethan Lawrence, is a 81 y.o. male, DOB - July 06, 1922, RUE:[id number]  Admit date - 02/25/2015   Admitting Physician Ron Parker, MD  Outpatient Primary MD for the patient is PROVIDER NOT IN SYSTEM  LOS - 8   Chief Complaint  Patient presents with  . Back Pain       Admission HPI/Brief narrative: 81 yo very functional male history of CAD, CHF,Chronic Atrial Fib on Coumadin Rx, AV Block S/P Pacemaker, AS, AAA S/P Repair, HTN, admitted with back pain progressive over the past 2-3 days and febrile illness for a week. He has been running a fever nightly for the past week, found to have fever of 103 on admission. [name] blood cultures are growing streptococcus beta evidence and ID was consulted. He underwent a TEE which showed  endocarditis. He has a very tender right sided inguinal hernia and surgery evaluated without need for acute interventions especially in the setting of bacteremia,  complaining of lower back pain , no evidence of discitis on imaging, at with high clinical suspicion..  Subjective:   [name] today has, No headache, No chest pain, No abdominal pain - No Nausea, still continues of lower back pain , no recurrence of fever.  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 - Blood culture growing Streptococcus viridans - Sepsis secondary to strep viridans bacteremia/endocarditis, questionable discitis, unable to obtain MRI lumbar spine secondary to pacemaker, unable to obtain CT lumbar spine with contrast secondary to chronic kidney disease. - Fever 100.5 on 12/23 leukocytosis, negative urinalysis, no acute cardiopulmonary disease on chest x-ray, no recurrence of fever.  Strep Viridans bacteremia/ Endocarditis  - TEE on 12/20 showing a 8 mm x 4 mm mobile target on  mitral valve and  prosthetic aortic valve sewing ring. No vegetations were noted on the pacer lead. - ID input greatly appreciated, plan to continue with IV antibiotics for 8 weeks. - IV antibiotic has been narrowed to Rocephin 2 g IV daily. - Discussed with CT surgery Dr. Morton Peters, currently patient is not a surgical candidate - Discussed with Dr. Kristin Bruins regarding finding of possible periapical abscess on panoramic view, he does not find clinical evidence for this on his reading and evaluation. - PICC inserted 12/23   New onset severe lower back pain - CT lumbar spine without contrast with no evidence of discitis, but high clinical suspicion, patient will be treated with 8 weeks of IV antibiotics for his endocarditis which should treat this as well. - She continues to have significant lower back pain, renal function remained stable -  continue with pain medicine as needed - PT following  Inguinal hernia - general surgery consulted given possible right inguinal hernia and tenderness in that area, defer any intervention for now   Anemia - chronic, stable  Chronic A fib -On warfarin, dosed by pharmacy  CAD s/p CABG - stable, no chest pain  AV block s/p pacemaker  HTN - continue home medications  Acute on chronic diastolic heart failure - some vascular congestion on CXR, on room air comfortable in the room , will resume Lasix given worsening lower  extremity edema.   CKD stage III - Cr close to baseline, received IV contrast, closely monitor - Cr stable   Thrombocytopenia - chronic, stable  Diarrhea - Will discontinue laxatives Code Status: Full  Family Communication: D/W daughter at bedside  Disposition Plan: Awaiting  SNF placment when PASSAR and bed available.   Procedures    TEE Impressions: - There is a 8 mm x 4 mm mobile target on the atrial side of themitral valve at the region of the P3 segment that is concerning for endocarditis given the clinical scenario. Cannot rule outruptured cord. There is no associated valve destruction and themitral regurgitation appears to be separate from the suspectedlesion. There is also a mobile target on the superior surface of the prosthetic aortic valve sewing ring. The aortic valve gradients are moderately elevated, consistent with moderate steonis, asseen on transthoracic echo. The valve is well-seated and there isno rocking motion. There is no aortic reguritation.No vegetations were noted on the pacer lead.   Consults   Infectious disease Gen. surgery Cardiology via phone CT surgery Dr. Morton Peters via phone   Medications  Scheduled Meds: . cefTRIAXone (ROCEPHIN)  IV  2 g Intravenous Q24H  . clotrimazole   Topical BID  . diclofenac  1 patch Transdermal BID  . folic acid  1 mg Oral Daily  . furosemide  20 mg Oral Daily  . gabapentin  300 mg Oral BID  . multivitamin-lutein  1 capsule Oral BID  . pantoprazole  40 mg Oral Daily  . predniSONE  5 mg Oral Q breakfast  . rOPINIRole  1 mg Oral Daily  . simvastatin  20 mg Oral QHS  . sodium chloride  3 mL Intravenous Q12H  . warfarin  6 mg Oral ONCE-1800  . Warfarin - Pharmacist Dosing Inpatient   Does not apply q1800   Continuous Infusions:  PRN Meds:.sodium chloride, acetaminophen **OR** acetaminophen, alum & mag hydroxide-simeth, HYDROmorphone (DILAUDID) injection, HYDROmorphone (DILAUDID) injection,  methocarbamol, ondansetron **OR** ondansetron (ZOFRAN) IV, oxyCODONE, oxyCODONE, sodium chloride, sodium chloride  DVT Prophylaxis  warfarin  Lab Results  Component Value Date   PLT 205 03/05/2015    Antibiotics    Anti-infectives    Start     Dose/Rate Route Frequency Ordered Stop   02/28/15 1200  cefTRIAXone (ROCEPHIN) 2 g in dextrose 5 % 50 mL IVPB     2 g 100 mL/hr over 30 Minutes Intravenous Every 24 hours 02/28/15 1055 04/23/15 2359   02/27/15 2000  Ampicillin-Sulbactam (UNASYN) 3 g in sodium chloride 0.9 % 100 mL IVPB  Status:  Discontinued     3 g 100 mL/hr over 60 Minutes Intravenous Every 12 hours 02/27/15 1931 02/28/15 1054   02/27/15 0600  vancomycin (VANCOCIN) IVPB 1000 mg/200 mL premix  Status:  Discontinued     1,000 mg 200 mL/hr over 60 Minutes Intravenous Every 24 hours 02/26/15 0706 02/28/15 1054   02/26/15 1500  piperacillin-tazobactam (ZOSYN) IVPB 3.375  g  Status:  Discontinued     3.375 g 12.5 mL/hr over 240 Minutes Intravenous 3 times per day 02/26/15 0706 02/27/15 1859   02/26/15 0730  piperacillin-tazobactam (ZOSYN) IVPB 3.375 g     3.375 g 100 mL/hr over 30 Minutes Intravenous STAT 02/26/15 0706 02/26/15 1105   02/26/15 0530  vancomycin (VANCOCIN) 1,250 mg in sodium chloride 0.9 % 250 mL IVPB     1,250 mg 166.7 mL/hr over 90 Minutes Intravenous  Once 02/26/15 0517 02/26/15 0712   02/25/15 2045  cefTRIAXone (ROCEPHIN) 2 g in dextrose 5 % 50 mL IVPB     2 g 100 mL/hr over 30 Minutes Intravenous  Once 02/25/15 2040 02/25/15 2244          Objective:   Filed Vitals:   03/05/15 2058 03/06/15 0129 03/06/15 0519 03/06/15 1130  BP: 131/76 126/69 138/73 106/67  Pulse: 83 99 94 85  Temp: 99.7 F (37.6 C) 99.5 F (37.5 C) 99.7 F (37.6 C) 100 F (37.8 C)  TempSrc: Oral Oral Oral Oral  Resp: 18 16 18 20   Height:      Weight:      SpO2: 98% 97% 96% 97%    Wt Readings from Last 3 Encounters:  02/26/15 81.829 kg (180 lb 6.4 oz)  02/23/15 77.656 kg  (171 lb 3.2 oz)  02/21/15 78.291 kg (172 lb 9.6 oz)     Intake/Output Summary (Last 24 hours) at 03/06/15 1346 Last data filed at 03/06/15 0555  Gross per 24 hour  Intake      0 ml  Output    425 ml  Net   -425 ml     Physical Exam  Awake Alert, Oriented X 3,  [place].AT,PERRAL Supple Neck,No JVD,  Symmetrical Chest wall movement, Good air movement bilaterally, CTAB irregular, murmur +, No Gallops,Rubs , No Parasternal Heave +ve B.Sounds, Abd Soft, No tenderness, No organomegaly appriciated, No rebound - guarding or rigidity. No Cyanosis, Clubbing , No new Rash or bruise     Data Review   Micro Results Recent Results (from the past 240 hour(s))  Blood Culture (routine x 2)     Status: None   Collection Time: 02/25/15  8:45 PM  Result Value Ref Range Status   Specimen Description BLOOD RIGHT FOREARM  Final   Special Requests BOTTLES DRAWN AEROBIC AND ANAEROBIC 5C  Final   Culture  Setup Time   Final    GRAM POSITIVE COCCI IN PAIRS CRITICAL RESULT CALLED TO, READ BACK BY AND VERIFIED WITH: K BREINICH 02/26/15 @ 1209 M VESTAL IN BOTH AEROBIC AND ANAEROBIC BOTTLES    Culture   Final    VIRIDANS STREPTOCOCCUS Performed at [facility]    Report Status 02/28/2015 FINAL  Final   Organism ID, Bacteria VIRIDANS STREPTOCOCCUS  Final      Susceptibility   Viridans streptococcus - MIC*    ERYTHROMYCIN <=0.12 SENSITIVE Sensitive     TETRACYCLINE >=16 RESISTANT Resistant     VANCOMYCIN 0.5 SENSITIVE Sensitive     CLINDAMYCIN <=0.25 SENSITIVE Sensitive     PENICILLIN <=0.06 SENSITIVE Sensitive     CEFTRIAXONE <=0.12 SENSITIVE Sensitive     LEVOFLOXACIN 2 SENSITIVE Sensitive     * VIRIDANS STREPTOCOCCUS  Blood Culture (routine x 2)     Status: None   Collection Time: 02/25/15  9:15 PM  Result Value Ref Range Status   Specimen Description BLOOD LEFT FOREARM  Final   Special Requests BOTTLES DRAWN AEROBIC AND  ANAEROBIC 5CC  Final   Culture  Setup Time   Final    GRAM  POSITIVE COCCI IN PAIRS IN BOTH AEROBIC AND ANAEROBIC BOTTLES CRITICAL RESULT CALLED TO, READ BACK BY AND VERIFIED WITH: K BREINICH 02/26/15 @ 1209 M VESTAL    Culture   Final    VIRIDANS STREPTOCOCCUS SUSCEPTIBILITIES PERFORMED ON PREVIOUS CULTURE WITHIN THE LAST 5 DAYS. Performed at [facility]    Report Status 02/28/2015 FINAL  Final  Urine culture     Status: None   Collection Time: 02/25/15 10:59 PM  Result Value Ref Range Status   Specimen Description URINE, CLEAN CATCH  Final   Special Requests NONE  Final   Culture   Final    8,000 COLONIES/mL INSIGNIFICANT GROWTH Performed at [facility]    Report Status 02/27/2015 FINAL  Final  Culture, blood (Routine X 2) w Reflex to ID Panel     Status: None   Collection Time: 02/27/15  6:45 PM  Result Value Ref Range Status   Specimen Description BLOOD LEFT ARM  Final   Special Requests BOTTLES DRAWN AEROBIC AND ANAEROBIC 5CC  Final   Culture NO GROWTH 5 DAYS  Final   Report Status 03/04/2015 FINAL  Final  Culture, blood (Routine X 2) w Reflex to ID Panel     Status: None   Collection Time: 02/27/15  6:50 PM  Result Value Ref Range Status   Specimen Description BLOOD LEFT HAND  Final   Special Requests IN PEDIATRIC BOTTLE 1CC  Final   Culture NO GROWTH 5 DAYS  Final   Report Status 03/04/2015 FINAL  Final    Radiology Reports Dg Orthopantogram  02/28/2015  CLINICAL DATA:  Jaw pain EXAM: ORTHOPANTOGRAM/PANORAMIC COMPARISON:  None. FINDINGS: The patient is predominantly edentulous. On the left adjacent to the root of the second mandibular bicuspid there is a circular lucency identified suggestive of periapical abscess. IMPRESSION: Likely periapical abscess on the left in the mandible adjacent to the root of the second bicuspid. Electronically Signed   By: Alcide Clever M.D.   On: 02/28/2015 14:13   Dg Chest 1 View  02/25/2015  CLINICAL DATA:  81 year old male with back pain for 6 hours. EXAM: CHEST 1 VIEW  COMPARISON:  Chest radiograph 01/20/2013. Included lung bases from CT abdomen/pelvis performed concurrently. FINDINGS: Single lead left-sided pacemaker remains in place. Patient is post median sternotomy with 2 prosthetic valves. Multi chamber cardiomegaly, progressed from prior exam. There is tortuosity and atherosclerosis of the thoracic aorta. Vascular congestion with possible mild perihilar edema. Calcified granuloma in the right upper lung. Small pleural effusions on CT are not seen radiographically. No confluent airspace disease. No pneumothorax. IMPRESSION: Cardiomegaly with vascular congestion and questionable perihilar edema. Recommend correlation for CHF. Electronically Signed   By: Rubye Oaks M.D.   On: 02/25/2015 22:30   Dg Chest 2 View  03/04/2015  CLINICAL DATA:  Fever 3 days. EXAM: CHEST  2 VIEW COMPARISON:  Radiograph 03/03/2015 FINDINGS: Left-sided pacemaker single lead overlies enlarged heart cardiac silhouette. RIGHT PICC line unchanged. Exam is lordotic. No effusion, infiltrate, pneumothorax. Flowing osteophytosis of thoracic spine noted. IMPRESSION: Cardiomegaly without acute cardiopulmonary findings. Electronically Signed   By: Genevive Bi M.D.   On: 03/04/2015 13:00   Dg Thoracic Spine 2 View  02/25/2015  CLINICAL DATA:  Lower back pain since 4:30 this afternoon. History of chronic back pain. No injury noted. EXAM: THORACIC SPINE 2 VIEWS COMPARISON:  Chest x-ray dated 01/20/2013. FINDINGS:  Portions of the thoracic spine are incompletely seen due to overlying osseous and soft tissue structures. Overall osseous alignment appears normal and grossly stable compared to a previous chest x-ray of 01/20/2013. No osseous fracture or dislocation seen. No acute - appearing cortical irregularity or osseous lesion. Paravertebral soft tissues are unremarkable for acute process. IMPRESSION: No acute findings. Electronically Signed   By: Bary Richard M.D.   On: 02/25/2015 22:29   Dg  Lumbar Spine Complete  02/25/2015  CLINICAL DATA:  81 year old male with lumbosacral back pain for 6 hours. EXAM: LUMBAR SPINE - COMPLETE 4+ VIEW COMPARISON:  Reformats from CT abdomen/ pelvis 09/02/2011. FINDINGS: Kyphoplasty within L4 compression deformity. Remaining vertebral body heights are maintained. Mild levoscoliosis centered at L2-L3 with associated degenerative disc disease. Additional disc space narrowing at L1-L2. The bones are under mineralized. Aorto bi-iliac stent in place. IMPRESSION: 1. Kyphoplasty within L4 compression deformity. 2. Scoliosis and degenerative change.  No acute bony abnormality. Electronically Signed   By: Rubye Oaks M.D.   On: 02/25/2015 22:26   Ct Lumbar Spine Wo Contrast  02/28/2015  CLINICAL DATA:  Several day history of low back pain. EXAM: CT LUMBAR SPINE WITHOUT CONTRAST TECHNIQUE: Multidetector CT imaging of the lumbar spine was performed without intravenous contrast administration. Multiplanar CT image reconstructions were also generated. COMPARISON:  CT scan 02/25/2015 FINDINGS: Remote vertebral augmentation changes at L4 a with a significant compression deformity. Mild stable retropulsion and mild canal encroachment. New line the other lumbar vertebral bodies are maintained. No acute fractures identified. S1 is not covered on this examination. Stable advanced degenerative disc disease at L3-4 along with advanced facet disease at L3-4 and L4-5. There are moderate-sized bilateral pleural effusions noted along with overlying basilar atelectasis. Stable aortoiliac stent graft. No complicating features are demonstrated. L1-2: Broad-based bulging annulus and osteophytic ridging with mild left lateral recess and left foraminal encroachment. This could irritate the left L1 nerve root. L2-3: Advanced degenerative disc disease and facet disease with a bulging annulus and osteophytic ridging. Mild flattening of the ventral thecal sac but no significant spinal or  foraminal stenosis. L3-4: Wide decompressive laminectomy and vertebral augmentation changes. No significant spinal stenosis. Right-sided disc osteophyte complex with significant right foraminal stenosis likely effecting the right L3 nerve root. L4-5: Bulging annulus, osteophytic ridging and facet disease with mild spinal and bilateral lateral recess stenosis. No significant foraminal stenosis. L5-S1:  Bulging annulus but no spinal or foraminal stenosis. IMPRESSION: 1. Remote L4 compression fracture with vertebral augmentation changes. 2. No new/acute fractures are identified. 3. Mild left lateral recess and left foraminal stenosis at L1-2. 4. Right foraminal/extra foraminal disc osteophyte complex on the right at L3-4 likely effecting the right L3 nerve root. 5. Mild spinal and bilateral lateral recess stenosis at L4-5. 6. Bilateral moderate-sized pleural effusions. Electronically Signed   By: Rudie Meyer M.D.   On: 02/28/2015 18:40   Ct Lumbar Spine W Contrast  03/06/2015  CLINICAL DATA:  Severe right-sided low back pain extending into the right leg. Recent fevers with blood cultures positive for Streptococcus. Evaluate for discitis. History of back surgery in 2011. EXAM: CT LUMBAR SPINE WITH CONTRAST TECHNIQUE: Multidetector CT imaging of the lumbar spine was performed with intravenous contrast administration. Multiplanar CT image reconstructions were also generated. CONTRAST:  75mL OMNIPAQUE IOHEXOL 300 MG/ML  SOLN COMPARISON:  Lumbar spine CT 02/28/2015.  Abdominal CT 02/25/2015. FINDINGS: The superior endplate compression fracture at L4 appears unchanged status post spinal augmentation. There is mild osseous retropulsion.  The overall alignment is stable with a convex left scoliosis. There is no evidence of acute fracture, traumatic subluxation or bone destruction. Multilevel degenerative disc disease with vacuum phenomenon and endplate degeneration is unchanged. The individual disc space levels appear  unchanged. There is significant right foraminal narrowing at L3-4 with probable right L3 nerve root encroachment. At L4-5, there is mild multifactorial spinal stenosis with left foraminal narrowing due to asymmetric facet hypertrophy. No paraspinal inflammatory changes or fluid collections are identified. There is diffuse atherosclerosis with dilatation of the distal aorta status post aortoiliac stent grafting. The aorta measures up to 5.8 cm in diameter. Left renal cyst is grossly unchanged. IMPRESSION: 1. No acute findings or significant changes from recent CT of 6 days prior. 2. No evidence of osteomyelitis, discitis or paraspinal inflammation. 3. Stable L4 compression fracture status post spinal augmentation. 4. Stable multilevel spondylosis with significant foraminal narrowing on the right at L3-4 and on the left at L4-5 Electronically Signed   By: Carey BullocksWilliam  Veazey M.D.   On: 03/06/2015 10:53   Ct Abdomen Pelvis W Contrast  02/25/2015  CLINICAL DATA:  Severe low back pain with any movement. History of chronic back pain. Abdominal pain and tenderness. EXAM: CT ABDOMEN AND PELVIS WITH CONTRAST TECHNIQUE: Multidetector CT imaging of the abdomen and pelvis was performed using the standard protocol following bolus administration of intravenous contrast. CONTRAST:  70mL OMNIPAQUE IOHEXOL 300 MG/ML  SOLN COMPARISON:  09/02/2011 FINDINGS: Small bilateral pleural effusions. Multiple surgical clips at the EG junction. Postoperative changes in the mediastinum. Surgical absence of the gallbladder. Calcified granulomas scattered throughout the liver and spleen. No hepatosplenomegaly. The pancreas, adrenal glands, inferior vena cava, and retroperitoneal lymph nodes are unremarkable. Abdominal aortic aneurysm post aortoiliac stent grafts. Native sac diameter is 5.6 cm. This measurement is similar to the previous study. Kidneys are atrophic bilaterally. Renal nephrograms appear symmetrical. No hydronephrosis. Large cysts in  both kidneys, largest is off of the lower pole of the left kidney and measures about 8.9 cm diameter. This cyst is increased in size since the previous study. The stomach, small bowel, and colon are not abnormally distended. The colon is diffusely stool-filled. No free air or free fluid in the abdomen. Surgical clips in the anterior abdominal wall. Pelvis: Prostate gland is not enlarged. Prostate defect may indicate previous TUR procedure. Bladder wall is not thickened. Appendix is surgically absent. No free or loculated pelvic fluid collections. No pelvic mass or lymphadenopathy. Vascular calcifications in the pelvis. Fat in the inguinal canals. Degenerative changes in the lumbar spine. L4 compression deformity post kyphoplasty. No destructive bone lesions. IMPRESSION: No significant acute process demonstrated in the abdomen or pelvis. There are small bilateral pleural effusions. Postoperative changes demonstrated throughout the chest, abdomen, and pelvis. Abdominal aortic aneurysm post stent graft. Native sac diameter is 5.6 cm without change since prior study. Multiple renal cysts with enlarging large cyst on the left kidney. Electronically Signed   By: Burman NievesWilliam  Stevens M.D.   On: 02/25/2015 22:42   Dg Chest Port 1 View  03/03/2015  CLINICAL DATA:  Bedside PICC placement. EXAM: PORTABLE CHEST 1 VIEW COMPARISON:  02/25/2015 and earlier. FINDINGS: Right arm PICC tip projects over the lower SVC near the cavoatrial junction. Prior sternotomy for CABG, aortic valve replacement and mitral valve replacement or annuloplasty. Stable moderate to marked cardiomegaly. 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 Calcified granuloma in the right upper lobe. Lungs otherwise clear. No pneumothorax. No visible pleural effusions. Left subclavian single lead transvenous  pacemaker unchanged. IMPRESSION: 1. Right arm PICC tip projects over the lower SVC near the cavoatrial junction. 2. Stable moderate to marked  cardiomegaly. No acute cardiopulmonary disease. Electronically Signed   By: Hulan Saas M.D.   On: 03/03/2015 09:33   Ct Maxillofacial Ltd Wo Cm  02/28/2015  CLINICAL DATA:  81 year old male with sinusitis and bacteremia. EXAM: CT PARANASAL SINUS LIMITED WITHOUT CONTRAST TECHNIQUE: Non-contiguous multidetector CT images of the paranasal sinuses were obtained in a single plane without contrast. COMPARISON:  None. FINDINGS: There is no acute fracture of the visualized facial bones. Bilateral cataract surgeries noted. The globes are otherwise intact. The retro-orbital fat and orbital walls are preserved. The paranasal sinuses are well aerated. No significant mucoperiosteal thickening. No air-fluid level. No soft tissue swelling noted. No acute hemorrhage identified within the visualized brain. IMPRESSION: Unremarkable CT of the paranasal sinuses. Electronically Signed   By: Elgie Collard M.D.   On: 02/28/2015 18:31     CBC  Recent Labs Lab 03/01/15 0614 03/02/15 0922 03/05/15 0445  WBC 6.2 6.5 5.4  HGB 9.2* 10.0* 9.1*  HCT 29.4* 31.4* 29.0*  PLT 150 186 205  MCV 93.6 92.1 92.1  MCH 29.3 29.3 28.9  MCHC 31.3 31.8 31.4  RDW 13.5 13.4 13.4    Chemistries   Recent Labs Lab 03/01/15 0614 03/03/15 0402 03/05/15 0445  NA 139 139 138  K 4.4 3.5 3.3*  CL 106 104 101  CO2 24 25 28   GLUCOSE 98 99 99  BUN 18 20 18   CREATININE 1.48* 1.51* 1.57*  CALCIUM 9.0 8.7* 8.8*   ------------------------------------------------------------------------------------------------------------------ estimated creatinine clearance is 32.3 mL/min (by C-G formula based on Cr of 1.57). ------------------------------------------------------------------------------------------------------------------ No results for input(s): HGBA1C in the last 72 hours. ------------------------------------------------------------------------------------------------------------------ No results for input(s): CHOL, HDL,  LDLCALC, TRIG, CHOLHDL, LDLDIRECT in the last 72 hours. ------------------------------------------------------------------------------------------------------------------ No results for input(s): TSH, T4TOTAL, T3FREE, THYROIDAB in the last 72 hours.  Invalid input(s): FREET3 ------------------------------------------------------------------------------------------------------------------ No results for input(s): VITAMINB12, FOLATE, FERRITIN, TIBC, IRON, RETICCTPCT in the last 72 hours.  Coagulation profile  Recent Labs Lab 03/02/15 0332 03/03/15 0402 03/04/15 0503 03/05/15 0445 03/06/15 0507  INR 2.60* 2.07* 1.52* 1.41 1.45    No results for input(s): DDIMER in the last 72 hours.  Cardiac Enzymes No results for input(s): CKMB, TROPONINI, MYOGLOBIN in the last 168 hours.  Invalid input(s): CK ------------------------------------------------------------------------------------------------------------------ Invalid input(s): POCBNP     Time Spent in minutes   20 minutes    Lynnann Knudsen M.D on 03/06/2015 at 1:46 PM  Between 7am to 7pm - Pager - [phone]  After 7pm go to [email or web address] - password [facility]  [facility]   Office  [phone]

## 2015-03-06 NOTE — Progress Notes (Signed)
ANTICOAGULATION CONSULT NOTE - Follow Up Consult  Pharmacy Consult for warfarin Indication: atrial fibrillation  Allergies  Allergen Reactions  . Tape Other (See Comments)    Pulls skin off  . Vancomycin Other (See Comments)    Hearing loss    Patient Measurements: Height: 5\' 10"  (177.8 cm) Weight: 180 lb 6.4 oz (81.829 kg) IBW/kg (Calculated) : 73  Vital Signs: Temp: 99.7 F (37.6 C) (12/26 0519) Temp Source: Oral (12/26 0519) BP: 138/73 mmHg (12/26 0519) Pulse Rate: 94 (12/26 0519)  Labs:  Recent Labs  03/04/15 0503 03/05/15 0445 03/06/15 0507  HGB  --  9.1*  --   HCT  --  29.0*  --   PLT  --  205  --   LABPROT 18.4* 17.4* 17.7*  INR 1.52* 1.41 1.45  CREATININE  --  1.57*  --     Estimated Creatinine Clearance: 32.3 mL/min (by C-G formula based on Cr of 1.57).   Assessment: 81 y o M admitted 02/25/2015 with severe back pain. Patient is on Coumadin PTA for Afib ( Home Coumadin dose: 6 mg daily, except 3mg  on Tuesday and Saturday) INR therapeutic on admission, however d/t large INR jumps and missed doses- INR has been difficult to maintain therapeutic during this admission, may need to consider alternative dosing regimen upon patient discharge.  INR today is 1.45, Hgb 9.1, Plt 205. No s/sx of bleeding noted.  Goal of Therapy:  INR 2-3 Monitor platelets by anticoagulation protocol: Yes   Plan:  - Give warfarin 6mg  x1 tonight - Monitor daily INR, CBC q72h  - follow for s/sx of bleeding  Arcola JanskyMeagan Datrell Dunton, PharmD Clinical Pharmacy Resident Pager: [phone] 03/06/2015 9:44 AM

## 2015-03-07 LAB — GLUCOSE, CAPILLARY: GLUCOSE-CAPILLARY: 93 mg/dL (ref 65–99)

## 2015-03-07 LAB — PROTIME-INR
INR: 1.7 — AB (ref 0.00–1.49)
Prothrombin Time: 20 seconds — ABNORMAL HIGH (ref 11.6–15.2)

## 2015-03-07 MED ORDER — RISAQUAD PO CAPS: 2.0000 | ORAL_CAPSULE | Freq: Every day | ORAL | Status: DC

## 2015-03-07 MED ORDER — DEXTROSE 5 % IV SOLN: 2.0000 g | INTRAVENOUS | Status: AC

## 2015-03-07 MED ORDER — METHOCARBAMOL 500 MG PO TABS: 500.0000 mg | ORAL_TABLET | Freq: Four times a day (QID) | ORAL | Status: DC | PRN

## 2015-03-07 MED ORDER — DEXTROSE 5 % IV SOLN: 2.0000 g | INTRAVENOUS | Status: DC

## 2015-03-07 MED ORDER — ACETAMINOPHEN 325 MG PO TABS: 650.0000 mg | ORAL_TABLET | Freq: Four times a day (QID) | ORAL | Status: DC | PRN

## 2015-03-07 MED ORDER — OXYCODONE HCL 5 MG PO TABS: 5.0000 mg | ORAL_TABLET | ORAL | Status: DC | PRN

## 2015-03-07 MED ORDER — WARFARIN SODIUM 6 MG PO TABS
6.0000 mg | ORAL_TABLET | Freq: Once | ORAL | Status: AC
  Administered 2015-03-07: 6 mg via ORAL
  Filled 2015-03-07: qty 1

## 2015-03-07 MED ORDER — ONDANSETRON HCL 4 MG PO TABS: 4.0000 mg | ORAL_TABLET | Freq: Four times a day (QID) | ORAL | Status: DC | PRN

## 2015-03-07 MED ORDER — RISAQUAD PO CAPS
2.0000 | ORAL_CAPSULE | Freq: Every day | ORAL | Status: DC
  Administered 2015-03-07 – 2015-03-08 (×2): 2 via ORAL
  Filled 2015-03-07 (×2): qty 2

## 2015-03-07 NOTE — Care Management Important Message (Signed)
Important Message  Patient Details  Name: [name] MRN: [id number] Date of Birth: 16-Dec-1922   Medicare Important Message Given:  Yes    [name], [name] 03/07/2015, 12:55 PM

## 2015-03-07 NOTE — Progress Notes (Signed)
ANTICOAGULATION CONSULT NOTE - Follow Up Consult  Pharmacy Consult for warfarin Indication: atrial fibrillation  Allergies  Allergen Reactions  . Tape Other (See Comments)    Pulls skin off  . Vancomycin Other (See Comments)    Hearing loss    Patient Measurements: Height: 5\' 10"  (177.8 cm) Weight: 180 lb 6.4 oz (81.829 kg) IBW/kg (Calculated) : 73  Vital Signs: Temp: 99.7 F (37.6 C) (12/27 0926) Temp Source: Oral (12/27 0926) BP: 96/44 mmHg (12/27 0926) Pulse Rate: 95 (12/27 0926)  Labs:  Recent Labs  03/05/15 0445 03/06/15 0507 03/07/15 0331  HGB 9.1*  --   --   HCT 29.0*  --   --   PLT 205  --   --   LABPROT 17.4* 17.7* 20.0*  INR 1.41 1.45 1.70*  CREATININE 1.57*  --   --     Estimated Creatinine Clearance: 32.3 mL/min (by C-G formula based on Cr of 1.57).   Assessment: 81 y o M admitted 02/25/2015 with severe back pain. Patient is on Coumadin PTA for Afib ( Home Coumadin dose: 6 mg daily, except 3mg  on Tuesday and Saturday) INR therapeutic on admission, however d/t large INR jumps and missed doses- INR has been difficult to maintain therapeutic during this admission, may need to consider alternative dosing regimen upon patient discharge.  INR today is 1.70, Hgb 9.1, Plt 205. No s/sx of bleeding noted.  Goal of Therapy:  INR 2-3 Monitor platelets by anticoagulation protocol: Yes   Plan:  - Give warfarin 6 mg x1 tonight - Monitor daily INR, CBC q72h  - follow for s/sx of bleeding   Hazle NordmannKelsy Octavia Velador, PharmD Pharmacy Resident [phone] 03/07/2015 10:25 AM

## 2015-03-07 NOTE — Progress Notes (Signed)
Physical Therapy Treatment Patient Details Name: Ethan Lawrence MRN: [id number] DOB: [birth year] Today's Date: 03/07/2015    History of Present Illness Ethan Lawrence is a 81 y.o. male with a history of CAD, CHF,Chronic Atrial Fib on Coumadin Rx, AV Block S/P Pacemaker, AS, AAA S/P Repair, HTN who presents to the ED with complaints of sudden onset of Intractable 10/10 Spasmodic low back pain for several days. 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    PT Comments    On arrival, daughter and pt discussing d/c to her home or his home (in [place]). In either setting he will have to ascend 3 steps into home. Patient agreed to go to attempt stairs. Patient was pre-medicated prior and after steps he had 10/10 pain in back and RLE. Briefly discussed pro's/con's of each discharge plan and pt prefers to go to a SNF in [place] closer to his support system.    Follow Up Recommendations  SNF     Equipment Recommendations  Rolling walker with 5" wheels    Recommendations for Other Services       Precautions / Restrictions Precautions Precautions: Fall Precaution Comments: severe back pain Restrictions Weight Bearing Restrictions: No    Mobility  Bed Mobility Overal bed mobility: Modified Independent Bed Mobility: Sidelying to Sit   Sidelying to sit: Modified independent (Device/Increase time)       General bed mobility comments: with rail  Transfers Overall transfer level: Needs assistance Equipment used: Rolling walker (2 wheeled) Transfers: Sit to/from Stand Sit to Stand: Min guard         General transfer comment: verbal cues for hand placement   Ambulation/Gait Ambulation/Gait assistance: Min guard;+2 safety/equipment Ambulation Distance (Feet): 3 Feet (seated ride to stairs; 25) Assistive device: Rolling walker (2 wheeled) Gait Pattern/deviations: Step-to pattern;Decreased stance time - right;Decreased step length - left;Trunk flexed Gait velocity:  decr   General Gait Details: prior to stairs, more upright with little reliance on RW; after pain increased, more flexed   Stairs Stairs: Yes Stairs assistance: Min guard Stair Management: One rail Right;Forwards Number of Stairs: 5 General stair comments: pt able to correctly sequence step to pattern on steps to minimize RLE pain/chance of buckling  Wheelchair Mobility    Modified Rankin (Stroke Patients Only)       Balance   Sitting-balance support: No upper extremity supported;Feet supported Sitting balance-Leahy Scale: Fair     Standing balance support: No upper extremity supported Standing balance-Leahy Scale: Fair                      Cognition Arousal/Alertness: Awake/alert Behavior During Therapy: WFL for tasks assessed/performed Overall Cognitive Status: Within Functional Limits for tasks assessed                      Exercises      General Comments        Pertinent Vitals/Pain Pain Assessment: 0-10 Pain Score: 10-Worst pain ever Pain Location: back and Rt leg Pain Descriptors / Indicators: Grimacing;Guarding;Moaning Pain Intervention(s): Limited activity within patient's tolerance;Monitored during session;Premedicated before session;Repositioned;Patient requesting pain meds-RN notified    Home Living                      Prior Function            PT Goals (current goals can now be found in the care plan section) Acute Rehab PT Goals PT Goal Formulation: With patient  Time For Goal Achievement: 03/14/15 Potential to Achieve Goals: Good Progress towards PT goals: Progressing toward goals    Frequency  Min 3X/week    PT Plan Current plan remains appropriate    Co-evaluation             End of Session Equipment Utilized During Treatment: Gait belt Activity Tolerance: Patient limited by pain Patient left: in chair;with nursing/sitter in room;with family/visitor present;Other (comment) (MD in to examine)      Time: [id number] PT Time Calculation (min) (ACUTE ONLY): 33 min  Charges:  $Gait Training: 23-37 mins                    G Codes:      Azani Brogdon 03/07/2015, 10:35 AM Pager [phone]

## 2015-03-07 NOTE — Clinical Social Work Note (Signed)
Barrier to discharge: Awaiting [employer] authorization. CSW has attempted to contact [employer] ([employer]) numerous times on 03/07/2015. Office remains closed on 03/07/2015.  Marcelline Deistmily Kailan Laws, LCSW [phone] Orthopedics: [phone] Surgical: [phone]

## 2015-03-07 NOTE — Discharge Summary (Addendum)
Ethan Lawrence, is a 81 y.o. male  DOB [birth year]  MRN [id number].  Admission date:  02/25/2015  Admitting Physician  Theressa Millard, MD  Discharge Date:  03/08/2015   Primary MD  PROVIDER NOT IN SYSTEM  Recommendations for primary care physician for things to follow:  -Please check CBC, BMP in 2 days -Check INR in 2 days - Continue to monitor and dose warfarin target INR 2-3. - ID recommendations on discharge :   Discharge antibiotics: Rocephin 2 grams daily 8 weeks with an dates on the 12th 2016  Do not stop Antibiotics unless directed by ID.    [facility] Care Per Protocol:    Labs weekly while on IV antibiotics:   CBC with differential, CMP, CRP, ESR and fax weekly labs to [phone]    Clinic Follow Up Appt:- End of January in [facility]   Admission Diagnosis  Back pain [M54.9] Lumbar pain [M54.5] Lumbar back pain [M54.5] Fever, unspecified fever cause [R50.9]   Discharge Diagnosis  Back pain [M54.9] Lumbar pain [M54.5] Lumbar back pain [M54.5] Fever, unspecified fever cause [R50.9]    Principal Problem:   Intractable back pain Active Problems:   Anemia   Chronic a-fib on Coumadin   CAD (coronary artery disease) s/p CABG   HTN (hypertension)   Chronic diastolic heart failure (HCC)   Febrile illness   Lumbar pain   SIRS (systemic inflammatory response syndrome) (HCC)   Pyrexia   Lumbar back pain   Gram-positive bacteremia   Pacemaker infection ([place])   Streptococcus viridans infection   Acute endocarditis   Diskitis   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 Hypertension   . Hyperlipidemia   . CAD (coronary artery disease)     a. 05/2004 CABGx4 (LIMA->LAD, VG->RI, VG->OM, VG->RCA);  b. 08/2012 Cath: Native 3VD with 3/4 patent grafts (VG->RI occluded).  . Atrial fibrillation ([place])     . Macular degeneration   . Aortic stenosis     a. 08/2012 TEE: EF 45-55%, critical AS, Triv AI, mild to mod MR, Sev TR;  b. 08/2012 AVR (85m Magna Ease pericardial tissue valve) & TV Repair (335mMC3 annuloplasty ring).  . [place] KitchenAA [place]dominal [place]    a. 06/2010 s/p Endovascular AAA Repair  . Complete heart block, post-surgical ([place]    s/p MDT pacemaker implant by Dr TaLovena Le. Pain     PT HAS A LOT OF LOWER BACK PAIN- PREVIOUS BACK SURGERY AND DJD  . Pacemaker   . Shortness of breath     MOSTLY WITH EXERTION - IMPROVED SINCE HEART VALVE REPLACEMENT  . Arthritis     LEFT KNEE OA AND PAIN;  DJD LUMBAR  . Anemia requiring transfusions     MOST RECENT WAS JUNE 2014 WITH HEART SURGERY  . Complication of anesthesia     STATES HE DOES WELL WITH DIPROVAN  . Blood transfusion  without reported diagnosis   . Cataract   . CHF (congestive heart failure) [facility])     Past Surgical History  Procedure Laterality Date  . Endovascular stent insertion  07/05/2010  . Appendectomy    . Coronary artery bypass graft    . Vascular surgery    . Back surgery    . Cardiac catheterization    . Esophagogastroduodenoscopy  06/19/2011    Procedure: ESOPHAGOGASTRODUODENOSCOPY (EGD);  Surgeon: Wonda Horner, MD;  Location: [place];  Service: Gastroenterology;  Laterality: N/A;  . Colonoscopy  06/19/2011    Procedure: COLONOSCOPY;  Surgeon: Wonda Horner, MD;  Location: [place];  Service: Gastroenterology;  Laterality: N/A;  . Eye surgery      bilater cataracts removed  . Aortic valve replacement N/A 09/01/2012    Procedure: REDO AORTIC VALVE REPLACEMENT (AVR);  Surgeon: Ivin Poot, MD;  Location: [place];  Service: Open Heart Surgery;  Laterality: N/A;  . Tricuspid valve replacement N/A 09/01/2012    Procedure: TRICUSPID VALVE REPAIR;  Surgeon: Ivin Poot, MD;  Location: [place];  Service: Open Heart Surgery;  Laterality: N/A;  . Intraoperative transesophageal echocardiogram N/A 09/01/2012     Procedure: INTRAOPERATIVE TRANSESOPHAGEAL ECHOCARDIOGRAM;  Surgeon: Ivin Poot, MD;  Location: [place];  Service: Open Heart Surgery;  Laterality: N/A;  . Pacemaker insertion  09/07/2012    Medtronic Sensia single chamber pacemaker implanted by Dr Lovena Le   . Surgery about 50 yrs ago for perforated ulcer and repair of hiatal hernia    . Total knee arthroplasty Left 02/15/2013    Procedure: LEFT TOTAL KNEE ARTHROPLASTY;  Surgeon: Mauri Pole, MD;  Location: [facility];  Service: Orthopedics;  Laterality: Left;  . Permanent pacemaker insertion N/A 09/07/2012    Procedure: PERMANENT PACEMAKER INSERTION;  Surgeon: Evans Lance, MD;  Location: [facility] C[facility] LAB;  Service: Cardiovascular;  Laterality: N/A;  . Hernia repair    . Joint replacement    . Spine surgery    . Cardiac valve replacement    . Prostate surgery    . Tee without cardioversion N/A 02/27/2015    Procedure: TRANSESOPHAGEAL ECHOCARDIOGRAM (TEE);  Surgeon: Skeet Latch, MD;  Location: [place];  Service: Cardiovascular;  Laterality: N/A;     History of present illness and  Hospital Course:     Kindly see H&P for history of present illness and admission details, please review complete Labs, Consult reports and Test reports for all details in brief  HPI  from the history and physical done on the day of admission 03/07/2015  Ethan Lawrence is a 81 y.o. male with a history of CAD, CHF,Chronic Atrial Fib on Coumadin Rx, AV Block S/P Pacemaker, AS, AAA S/P Repair, HTN who presents to the ED with complaints of sudden onset of Intractable 10/10 Spasmodic low back pain for several days. He denies any trauma or fall or heavy lifting. The pain started when he tried to get up from the couch while he was sitting. He was taken to [facility] and evaluated and found to have fever to 103.2 , and a sepsis workup was initiated, and he was administered 2 grams of IV Rocephin. A CT scan of the ABD/Pelvis was also performed and was  negative for acute findings. He was sent from the [facility] ED to the [facility] ED in order to get an MRI of the Lumbar Spine to rule out and epidural abscess but an MRI could not be performed since  he has a pacemaker. His antibiotic coverage was expanded and IV Vancomycin was added, and he was administered IV Decadron, and IV Robaxin along with PAin medication. He was referred for admission.   Hospital Course  81 yo very functional male history of CAD, CHF,Chronic Atrial Fib on Coumadin Rx, AV Block S/P Pacemaker, AS, AAA S/P Repair, HTN, admitted with back pain progressive over the past 2-3 days and febrile illness for a week. He has been running a fever nightly for the past week, found to have fever of 103 on admission. [name] blood cultures are growing streptococcus beta evidence and ID was consulted. He underwent a TEE which showed endocarditis. He has a very tender right sided inguinal hernia and surgery evaluated without need for acute interventions especially in the setting of bacteremia, complaining of lower back pain , no evidence of discitis on imaging, at with high clinical suspicion..  Sepsis due to strep viridans bacteremia/endocarditis - patient with fever and tachycardia on admission. - Blood culture growing Streptococcus viridans, repeated blood cultures 12/19 with no growth at final results. - Sepsis secondary to strep viridans bacteremia/endocarditis, questionable discitis, unable to obtain MRI lumbar spine secondary to pacemaker. - Plan to continue with IV Rocephin total of 8 weeks as per ID recommendation. - Follow up with ID as an outpatient  Strep Viridans bacteremia/ Endocarditis  - TEE on 12/20 showing a 8 mm x 4 mm mobile target on mitral valve and prosthetic aortic valve sewing ring. No vegetations were noted on the pacer lead. - ID input greatly appreciated, plan to continue with IV antibiotics for 8 weeks, stop date 04/23/2015 - IV antibiotic has been  narrowed to Rocephin 2 g IV daily. - Discussed with CT surgery Dr. Nils Pyle, currently patient is not a surgical candidate - Discussed with Dr. Enrique Sack regarding finding of possible periapical abscess on panoramic view, he does not find clinical evidence for this on his reading and evaluation. - PICC inserted 12/23   New onset severe lower back pain - CT lumbar spine  with no evidence of discitis, but high clinical suspicion, patient will be treated with 8 weeks of IV antibiotics for his endocarditis which should treat this as well. - Lower back pain is improving, continue with when necessary pain meds, continue with PT.  Inguinal hernia - general surgery consulted given possible right inguinal hernia and tenderness in that area, defer any intervention for now  - Daughter Reports he has a follow-up appointment with Dr. Ninfa Linden  Anemia - chronic, stable  Chronic A fib -On warfarin, be monitored and dosed by skilled nursing facility  CAD s/p CABG - stable, no chest pain  AV block s/p pacemaker  HTN - continue home medications  Acute on chronic diastolic heart failure - some vascular congestion on CXR, on room air comfortable in the room , resume Lasix   CKD stage III - Cr close to baseline   Thrombocytopenia - chronic, stable  Diarrhea - Will discontinue laxatives   Discharge Condition:  Stable   Follow UP  Follow-up Information    Follow up with [facility]. Schedule an appointment as soon as possible for a visit in 4 weeks.   Why:  To follow-up on bacteremia   Contact information:   [address] [place] [id number] [place] [phone]        Discharge Instructions  and  Discharge Medications         Discharge Instructions  Diet - low sodium heart healthy    Complete by:  As directed      Discharge instructions    Complete by:  As directed   Follow with Primary MD  after discharge from SNF.  Get CBC,  CMP, 2 view Chest X ray checked  by Primary MD next visit.    Activity: As tolerated with Full fall precautions use walker/cane & assistance as needed   Disposition SNF   Diet: Heart Healthy  , with feeding assistance and aspiration precautions.  For Heart failure patients - Check your Weight same time everyday, if you gain over 2 pounds, or you develop in leg swelling, experience more shortness of breath or chest pain, call your Primary MD immediately. Follow Cardiac Low Salt Diet and 1.5 lit/day fluid restriction.   On your next visit with your primary care physician please Get Medicines reviewed and adjusted.   Please request your Prim.MD to go over all Hospital Tests and Procedure/Radiological results at the follow up, please get all Hospital records sent to your Prim MD by signing hospital release before you go home.   If you experience worsening of your admission symptoms, develop shortness of breath, life threatening emergency, suicidal or homicidal thoughts you must seek medical attention immediately by calling 911 or calling your MD immediately  if symptoms less severe.  You Must read complete instructions/literature along with all the possible adverse reactions/side effects for all the Medicines you take and that have been prescribed to you. Take any new Medicines after you have completely understood and accpet all the possible adverse reactions/side effects.   Do not drive, operating heavy machinery, perform activities at heights, swimming or participation in water activities or provide baby sitting services if your were admitted for syncope or siezures until you have seen by Primary MD or a Neurologist and advised to do so again.  Do not drive when taking Pain medications.    Do not take more than prescribed Pain, Sleep and Anxiety Medications  Special Instructions: If you have smoked or chewed Tobacco  in the last 2 yrs please stop smoking, stop any regular Alcohol  and or  any Recreational drug use.  Wear Seat belts while driving.   Please note  You were cared for by a hospitalist during your hospital stay. If you have any questions about your discharge medications or the care you received while you were in the hospital after you are discharged, you can call the unit and asked to speak with the hospitalist on call if the hospitalist that took care of you is not available. Once you are discharged, your primary care physician will handle any further medical issues. Please note that NO REFILLS for any discharge medications will be authorized once you are discharged, as it is imperative that you return to your primary care physician (or establish a relationship with a primary care physician if you do not have one) for your aftercare needs so that they can reassess your need for medications and monitor your lab values.     Increase activity slowly    Complete by:  As directed             Medication List    STOP taking these medications        docusate sodium 100 MG capsule  Commonly known as:  COLACE     HYDROcodone-acetaminophen 10-325 MG tablet  Commonly known as:  NORCO     hydrocortisone cream 1 %  TAKE these medications        acetaminophen 325 MG tablet  Commonly known as:  TYLENOL  Take 2 tablets (650 mg total) by mouth every 6 (six) hours as needed for mild pain (or Fever >/= 101).     acidophilus Caps capsule  Take 2 capsules by mouth daily.     cefTRIAXone 2 g in dextrose 5 % 50 mL  Inject 2 g into the vein daily. Patient to continue IV Rocephin total of 8 weeks with stop date 4/78/2956     folic acid 1 MG tablet  Commonly known as:  FOLVITE  Take 1 mg by mouth daily.     furosemide 20 MG tablet  Commonly known as:  LASIX  Take 20 mg by mouth daily as needed (swelling).     gabapentin 300 MG capsule  Commonly known as:  NEURONTIN  Take 300 mg by mouth 2 (two) times daily.     methocarbamol 500 MG tablet  Commonly known as:   ROBAXIN  Take 1 tablet (500 mg total) by mouth every 6 (six) hours as needed for muscle spasms.     ondansetron 4 MG tablet  Commonly known as:  ZOFRAN  Take 1 tablet (4 mg total) by mouth every 6 (six) hours as needed for nausea.     oxyCODONE 5 MG immediate release tablet  Commonly known as:  Oxy IR/ROXICODONE  Take 1 tablet (5 mg total) by mouth every 6 (six) hours as needed for breakthrough pain (Please give prior to physical therapy).     predniSONE 5 MG tablet  Commonly known as:  DELTASONE  Take 1 tablet (5 mg total) by mouth daily with breakfast.     PRESERVISION AREDS 2 Caps  Take 1 tablet by mouth 2 (two) times daily. Reported on 02/23/2015     PRILOSEC 20 MG capsule  Generic drug:  omeprazole  Take 20 mg by mouth daily.     rOPINIRole 1 MG tablet  Commonly known as:  REQUIP  Take 1 mg by mouth daily.     senna 8.6 MG Tabs tablet  Commonly known as:  SENOKOT  Take 2 tablets (17.2 mg total) by mouth at bedtime.     simvastatin 20 MG tablet  Commonly known as:  ZOCOR  Take 20 mg by mouth at bedtime.     traMADol 50 MG tablet  Commonly known as:  ULTRAM  Take 0.5 tablets (25 mg total) by mouth daily.     warfarin 4 MG tablet  Commonly known as:  COUMADIN  Take 2 tablets (8 mg total) by mouth one time only at 6 PM. On 12/28     warfarin 6 MG tablet  Commonly known as:  COUMADIN  Take 1 tablet (6 mg total) by mouth daily at 6 PM. Starting 12/29  Start taking on:  03/09/2015          Diet and Activity recommendation: See Discharge Instructions above  Consults obtained -   Infectious disease Gen. surgery Cardiology via phone CT surgery Dr. Nils Pyle via phone Major procedures and Radiology Reports - PLEASE review detailed and final reports for all details, in brief -   TEE Impressions: - There is a 8 mm x 4 mm mobile target on the atrial side of themitral valve at the region of the P3 segment that is concerning for endocarditis given the  clinical scenario. Cannot rule outruptured cord. There is no associated valve destruction and themitral regurgitation appears to  be separate from the suspectedlesion. There is also a mobile target on the superior surface of the prosthetic aortic valve sewing ring. The aortic valve gradients are moderately elevated, consistent with moderate steonis, asseen on transthoracic echo. The valve is well-seated and there isno rocking motion. There is no aortic reguritation.No vegetations were noted on the pacer lead.    Dg Orthopantogram  02/28/2015  CLINICAL DATA:  Jaw pain EXAM: ORTHOPANTOGRAM/PANORAMIC COMPARISON:  None. FINDINGS: The patient is predominantly edentulous. On the left adjacent to the root of the second mandibular bicuspid there is a circular lucency identified suggestive of periapical abscess. IMPRESSION: Likely periapical abscess on the left in the mandible adjacent to the root of the second bicuspid. Electronically Signed   By: Inez Catalina M.D.   On: 02/28/2015 14:13   Dg Chest 1 View  02/25/2015  CLINICAL DATA:  81 year old male with back pain for 6 hours. EXAM: CHEST 1 VIEW COMPARISON:  Chest radiograph 01/20/2013. Included lung bases from CT abdomen/pelvis performed concurrently. FINDINGS: Single lead left-sided pacemaker remains in place. Patient is post median sternotomy with 2 prosthetic valves. Multi chamber cardiomegaly, progressed from prior exam. There is tortuosity and atherosclerosis of the thoracic aorta. Vascular congestion with possible mild perihilar edema. Calcified granuloma in the right upper lung. Small pleural effusions on CT are not seen radiographically. No confluent airspace disease. No pneumothorax. IMPRESSION: Cardiomegaly with vascular congestion and questionable perihilar edema. Recommend correlation for CHF. Electronically Signed   By: Jeb Levering M.D.   On: 02/25/2015 22:30   Dg Chest 2 View  03/04/2015  CLINICAL DATA:  Fever 3 days. EXAM: CHEST   2 VIEW COMPARISON:  Radiograph 03/03/2015 FINDINGS: Left-sided pacemaker single lead overlies enlarged heart cardiac silhouette. RIGHT PICC line unchanged. Exam is lordotic. No effusion, infiltrate, pneumothorax. Flowing osteophytosis of thoracic spine noted. IMPRESSION: Cardiomegaly without acute cardiopulmonary findings. Electronically Signed   By: Suzy Bouchard M.D.   On: 03/04/2015 13:00   Dg Thoracic Spine 2 View  02/25/2015  CLINICAL DATA:  Lower back pain since 4:30 this afternoon. History of chronic back pain. No injury noted. EXAM: THORACIC SPINE 2 VIEWS COMPARISON:  Chest x-ray dated 01/20/2013. FINDINGS: Portions of the thoracic spine are incompletely seen due to overlying osseous and soft tissue structures. Overall osseous alignment appears normal and grossly stable compared to a previous chest x-ray of 01/20/2013. No osseous fracture or dislocation seen. No acute - appearing cortical irregularity or osseous lesion. Paravertebral soft tissues are unremarkable for acute process. IMPRESSION: No acute findings. Electronically Signed   By: Franki Cabot M.D.   On: 02/25/2015 22:29   Dg Lumbar Spine Complete  02/25/2015  CLINICAL DATA:  81 year old male with lumbosacral back pain for 6 hours. EXAM: LUMBAR SPINE - COMPLETE 4+ VIEW COMPARISON:  Reformats from CT abdomen/ pelvis 09/02/2011. FINDINGS: Kyphoplasty within L4 compression deformity. Remaining vertebral body heights are maintained. Mild levoscoliosis centered at L2-L3 with associated degenerative disc disease. Additional disc space narrowing at L1-L2. The bones are under mineralized. Aorto bi-iliac stent in place. IMPRESSION: 1. Kyphoplasty within L4 compression deformity. 2. Scoliosis and degenerative change.  No acute bony abnormality. Electronically Signed   By: Jeb Levering M.D.   On: 02/25/2015 22:26   Ct Lumbar Spine Wo Contrast  02/28/2015  CLINICAL DATA:  Several day history of low back pain. EXAM: CT LUMBAR SPINE WITHOUT  CONTRAST TECHNIQUE: Multidetector CT imaging of the lumbar spine was performed without intravenous contrast administration. Multiplanar CT image reconstructions were also generated.  COMPARISON:  CT scan 02/25/2015 FINDINGS: Remote vertebral augmentation changes at L4 a with a significant compression deformity. Mild stable retropulsion and mild canal encroachment. New line the other lumbar vertebral bodies are maintained. No acute fractures identified. S1 is not covered on this examination. Stable advanced degenerative disc disease at L3-4 along with advanced facet disease at L3-4 and L4-5. There are moderate-sized bilateral pleural effusions noted along with overlying basilar atelectasis. Stable aortoiliac stent graft. No complicating features are demonstrated. L1-2: Broad-based bulging annulus and osteophytic ridging with mild left lateral recess and left foraminal encroachment. This could irritate the left L1 nerve root. L2-3: Advanced degenerative disc disease and facet disease with a bulging annulus and osteophytic ridging. Mild flattening of the ventral thecal sac but no significant spinal or foraminal stenosis. L3-4: Wide decompressive laminectomy and vertebral augmentation changes. No significant spinal stenosis. Right-sided disc osteophyte complex with significant right foraminal stenosis likely effecting the right L3 nerve root. L4-5: Bulging annulus, osteophytic ridging and facet disease with mild spinal and bilateral lateral recess stenosis. No significant foraminal stenosis. L5-S1:  Bulging annulus but no spinal or foraminal stenosis. IMPRESSION: 1. Remote L4 compression fracture with vertebral augmentation changes. 2. No new/acute fractures are identified. 3. Mild left lateral recess and left foraminal stenosis at L1-2. 4. Right foraminal/extra foraminal disc osteophyte complex on the right at L3-4 likely effecting the right L3 nerve root. 5. Mild spinal and bilateral lateral recess stenosis at L4-5. 6.  Bilateral moderate-sized pleural effusions. Electronically Signed   By: Marijo Sanes M.D.   On: 02/28/2015 18:40   Ct Lumbar Spine W Contrast  03/06/2015  CLINICAL DATA:  Severe right-sided low back pain extending into the right leg. Recent fevers with blood cultures positive for Streptococcus. Evaluate for discitis. History of back surgery in 2011. EXAM: CT LUMBAR SPINE WITH CONTRAST TECHNIQUE: Multidetector CT imaging of the lumbar spine was performed with intravenous contrast administration. Multiplanar CT image reconstructions were also generated. CONTRAST:  36m OMNIPAQUE IOHEXOL 300 MG/ML  SOLN COMPARISON:  Lumbar spine CT 02/28/2015.  Abdominal CT 02/25/2015. FINDINGS: The superior endplate compression fracture at L4 appears unchanged status post spinal augmentation. There is mild osseous retropulsion. The overall alignment is stable with a convex left scoliosis. There is no evidence of acute fracture, traumatic subluxation or bone destruction. Multilevel degenerative disc disease with vacuum phenomenon and endplate degeneration is unchanged. The individual disc space levels appear unchanged. There is significant right foraminal narrowing at L3-4 with probable right L3 nerve root encroachment. At L4-5, there is mild multifactorial spinal stenosis with left foraminal narrowing due to asymmetric facet hypertrophy. No paraspinal inflammatory changes or fluid collections are identified. There is diffuse atherosclerosis with dilatation of the distal aorta status post aortoiliac stent grafting. The aorta measures up to 5.8 cm in diameter. Left renal cyst is grossly unchanged. IMPRESSION: 1. No acute findings or significant changes from recent CT of 6 days prior. 2. No evidence of osteomyelitis, discitis or paraspinal inflammation. 3. Stable L4 compression fracture status post spinal augmentation. 4. Stable multilevel spondylosis with significant foraminal narrowing on the right at L3-4 and on the left at L4-5  Electronically Signed   By: WRichardean SaleM.D.   On: 03/06/2015 10:53   Ct Abdomen Pelvis W Contrast  02/25/2015  CLINICAL DATA:  Severe low back pain with any movement. History of chronic back pain. Abdominal pain and tenderness. EXAM: CT ABDOMEN AND PELVIS WITH CONTRAST TECHNIQUE: Multidetector CT imaging of the abdomen and pelvis was performed  using the standard protocol following bolus administration of intravenous contrast. CONTRAST:  3m OMNIPAQUE IOHEXOL 300 MG/ML  SOLN COMPARISON:  09/02/2011 FINDINGS: Small bilateral pleural effusions. Multiple surgical clips at the EG junction. Postoperative changes in the mediastinum. Surgical absence of the gallbladder. Calcified granulomas scattered throughout the liver and spleen. No hepatosplenomegaly. The pancreas, adrenal glands, inferior vena cava, and retroperitoneal lymph nodes are unremarkable. Abdominal aortic aneurysm post aortoiliac stent grafts. Native sac diameter is 5.6 cm. This measurement is similar to the previous study. Kidneys are atrophic bilaterally. Renal nephrograms appear symmetrical. No hydronephrosis. Large cysts in both kidneys, largest is off of the lower pole of the left kidney and measures about 8.9 cm diameter. This cyst is increased in size since the previous study. The stomach, small bowel, and colon are not abnormally distended. The colon is diffusely stool-filled. No free air or free fluid in the abdomen. Surgical clips in the anterior abdominal wall. Pelvis: Prostate gland is not enlarged. Prostate defect may indicate previous TUR procedure. Bladder wall is not thickened. Appendix is surgically absent. No free or loculated pelvic fluid collections. No pelvic mass or lymphadenopathy. Vascular calcifications in the pelvis. Fat in the inguinal canals. Degenerative changes in the lumbar spine. L4 compression deformity post kyphoplasty. No destructive bone lesions. IMPRESSION: No significant acute process demonstrated in the abdomen  or pelvis. There are small bilateral pleural effusions. Postoperative changes demonstrated throughout the chest, abdomen, and pelvis. Abdominal aortic aneurysm post stent graft. Native sac diameter is 5.6 cm without change since prior study. Multiple renal cysts with enlarging large cyst on the left kidney. Electronically Signed   By: WLucienne CapersM.D.   On: 02/25/2015 22:42   Dg Chest Port 1 View  03/03/2015  CLINICAL DATA:  Bedside PICC placement. EXAM: PORTABLE CHEST 1 VIEW COMPARISON:  02/25/2015 and earlier. FINDINGS: Right arm PICC tip projects over the lower SVC near the cavoatrial junction. Prior sternotomy for CABG, aortic valve replacement and mitral valve replacement or annuloplasty. Stable moderate to marked cardiomegaly. 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 Calcified granuloma in the right upper lobe. Lungs otherwise clear. No pneumothorax. No visible pleural effusions. 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 IMPRESSION: 1. Right arm PICC tip projects over the lower SVC near the cavoatrial junction. 2. Stable moderate to marked cardiomegaly. No acute cardiopulmonary disease. Electronically Signed   By: TEvangeline DakinM.D.   On: 03/03/2015 09:33   CMiami ShoresCm  02/28/2015  CLINICAL DATA:  81year old male with sinusitis and bacteremia. EXAM: CT PARANASAL SINUS LIMITED WITHOUT CONTRAST TECHNIQUE: Non-contiguous multidetector CT images of the paranasal sinuses were obtained in a single plane without contrast. COMPARISON:  None. FINDINGS: There is no acute fracture of the visualized facial bones. Bilateral cataract surgeries noted. The globes are otherwise intact. The retro-orbital fat and orbital walls are preserved. The paranasal sinuses are well aerated. No significant mucoperiosteal thickening. No air-fluid level. No soft tissue swelling noted. No acute hemorrhage identified within the visualized brain. IMPRESSION: Unremarkable CT of the  paranasal sinuses. Electronically Signed   By: AAnner CreteM.D.   On: 02/28/2015 18:31    Micro Results    Recent Results (from the past 240 hour(s))  Culture, blood (Routine X 2) w Reflex to ID Panel     Status: None   Collection Time: 02/27/15  6:45 PM  Result Value Ref Range Status   Specimen Description BLOOD LEFT ARM  Final   Special Requests BOTTLES DRAWN AEROBIC AND  ANAEROBIC 5CC  Final   Culture NO GROWTH 5 DAYS  Final   Report Status 03/04/2015 FINAL  Final  Culture, blood (Routine X 2) w Reflex to ID Panel     Status: None   Collection Time: 02/27/15  6:50 PM  Result Value Ref Range Status   Specimen Description BLOOD LEFT HAND  Final   Special Requests IN PEDIATRIC BOTTLE [place]  Final   Culture NO GROWTH 5 DAYS  Final   Report Status 03/04/2015 FINAL  Final       Today   Subjective:   Ethan Lawrence today has no headache,no chest or abdominal pain, reports lower back pain is improving . Objective:   Blood pressure 126/72, pulse 99, temperature 98.7 F (37.1 C), temperature source Oral, resp. rate 20, height _0  (1.778 m), weight 81.829 kg (180 lb 6.4 oz), SpO2 97 %.   Intake/Output Summary (Last 24 hours) at 03/08/15 1134 Last data filed at 03/08/15 0533  Gross per 24 hour  Intake    720 ml  Output    600 ml  Net    120 ml    Exam Awake Alert, Oriented X 3,  [place].AT,PERRAL Supple Neck,No JVD,  Symmetrical Chest wall movement, Good air movement bilaterally, CTAB irregular, murmur +, No Gallops,Rubs , No Parasternal Heave +ve B.Sounds, Abd Soft, No tenderness, No organomegaly appriciated, No rebound - guarding or rigidity. No Cyanosis, Clubbing , No new Rash or bruise   Data Review   CBC w Diff:  Lab Results  Component Value Date   WBC 5.0 03/08/2015   WBC 5.1 02/23/2015   HGB 9.1* 03/08/2015   HGB 10.4* 02/23/2015   HCT 30.1* 03/08/2015   HCT 31.7* 02/23/2015   PLT 196 03/08/2015   LYMPHOPCT 12 02/25/2015   MONOPCT 8 02/25/2015     EOSPCT 0 02/25/2015   BASOPCT 0 02/25/2015    CMP:  Lab Results  Component Value Date   NA 138 03/05/2015   K 3.3* 03/05/2015   CL 101 03/05/2015   CO2 28 03/05/2015   BUN 18 03/05/2015   CREATININE 1.57* 03/05/2015   CREATININE 1.76* 02/21/2015   PROT 6.6 02/25/2015   ALBUMIN 3.4* 02/25/2015   BILITOT 0.8 02/25/2015   ALKPHOS 86 02/25/2015   AST 17 02/25/2015   ALT 10* 02/25/2015  .   Total Time in preparing paper work, data evaluation and todays exam - 21 minutes  Shain Pauwels M.D on 03/08/2015 at 11:34 AM  [facility]   Office  [phone]

## 2015-03-08 DIAGNOSIS — M5440 Lumbago with sciatica, unspecified side: Secondary | ICD-10-CM

## 2015-03-08 DIAGNOSIS — R7881 Bacteremia: Secondary | ICD-10-CM | POA: Insufficient documentation

## 2015-03-08 DIAGNOSIS — M549 Dorsalgia, unspecified: Secondary | ICD-10-CM | POA: Insufficient documentation

## 2015-03-08 LAB — CBC
HCT: 30.1 % — ABNORMAL LOW (ref 39.0–52.0)
Hemoglobin: 9.1 g/dL — ABNORMAL LOW (ref 13.0–17.0)
MCH: 27.8 pg (ref 26.0–34.0)
MCHC: 30.2 g/dL (ref 30.0–36.0)
MCV: 92 fL (ref 78.0–100.0)
PLATELETS: 196 10*3/uL (ref 150–400)
RBC: 3.27 MIL/uL — ABNORMAL LOW (ref 4.22–5.81)
RDW: 13.2 % (ref 11.5–15.5)
WBC: 5 10*3/uL (ref 4.0–10.5)

## 2015-03-08 LAB — PROTIME-INR
INR: 1.71 — ABNORMAL HIGH (ref 0.00–1.49)
Prothrombin Time: 20.1 seconds — ABNORMAL HIGH (ref 11.6–15.2)

## 2015-03-08 MED ORDER — WARFARIN SODIUM 6 MG PO TABS: 6.0000 mg | ORAL_TABLET | Freq: Every day | ORAL | Status: DC

## 2015-03-08 MED ORDER — OXYCODONE HCL 5 MG PO TABS: 5.0000 mg | ORAL_TABLET | Freq: Four times a day (QID) | ORAL | Status: DC | PRN

## 2015-03-08 MED ORDER — SENNA 8.6 MG PO TABS: 2.0000 | ORAL_TABLET | Freq: Every day | ORAL | Status: DC

## 2015-03-08 MED ORDER — WARFARIN SODIUM 7.5 MG PO TABS
8.5000 mg | ORAL_TABLET | Freq: Once | ORAL | Status: DC
  Filled 2015-03-08: qty 1

## 2015-03-08 MED ORDER — HEPARIN SOD (PORK) LOCK FLUSH 100 UNIT/ML IV SOLN
250.0000 [IU] | INTRAVENOUS | Status: AC | PRN
  Administered 2015-03-08: 250 [IU]

## 2015-03-08 MED ORDER — TRAMADOL HCL 50 MG PO TABS: 25.0000 mg | ORAL_TABLET | Freq: Every day | ORAL | Status: DC

## 2015-03-08 MED ORDER — WARFARIN SODIUM 4 MG PO TABS: 8.5000 mg | ORAL_TABLET | Freq: Once | ORAL | Status: DC

## 2015-03-08 NOTE — Clinical Social Work Note (Signed)
Patient to be d/c'ed today to [facility].  Patient and family agreeable to plans will transport via ems RN to call report to [phone].  Blayde Bacigalupi, MSW, LCSWA [phone]  

## 2015-03-08 NOTE — Care Management Note (Signed)
Case Management Note  Patient Details  Name: Ethan Lawrence MRN: [id number] Date of Birth: Oct 31, 1922  Subjective/Objective:                    Action/Plan: [facility] No further needs per CM.   Expected Discharge Date:                  Expected Discharge Plan:  Skilled Nursing Facility  In-House Referral:  Clinical Social Work  Discharge planning Services  CM Consult  Post Acute Care Choice:    Choice offered to:     DME Arranged:    DME Agency:     HH Arranged:    HH Agency:     Status of Service:  Completed, signed off  Medicare Important Message Given:  Yes Date Medicare IM Given:    Medicare IM give by:    Date Additional Medicare IM Given:    Additional Medicare Important Message give by:     If discussed at Long Length of Stay Meetings, dates discussed:    Additional Comments:  Kermit BaloKelli F Vastie Douty, RN 03/08/2015, 11:09 AM

## 2015-03-08 NOTE — Progress Notes (Signed)
[facility] called  

## 2015-03-08 NOTE — Progress Notes (Signed)
ANTICOAGULATION CONSULT NOTE - Follow Up Consult  Pharmacy Consult for warfarin Indication: atrial fibrillation  Allergies  Allergen Reactions  . Tape Other (See Comments)    Pulls skin off  . Vancomycin Other (See Comments)    Hearing loss    Patient Measurements: Height: 5\' 10"  (177.8 cm) Weight: 180 lb 6.4 oz (81.829 kg) IBW/kg (Calculated) : 73  Vital Signs: Temp: 98.5 F (36.9 C) (12/28 0535) Temp Source: Oral (12/28 0535) BP: 151/60 mmHg (12/28 0535) Pulse Rate: 84 (12/28 0535)  Labs:  Recent Labs  03/06/15 0507 03/07/15 0331 03/08/15 0533  HGB  --   --  9.1*  HCT  --   --  30.1*  PLT  --   --  196  LABPROT 17.7* 20.0* 20.1*  INR 1.45 1.70* 1.71*    Estimated Creatinine Clearance: 32.3 mL/min (by C-G formula based on Cr of 1.57).   Assessment: 81 y o M admitted 02/25/2015 with severe back pain. Patient is on Coumadin PTA for Afib ( Home Coumadin dose: 6 mg daily, except 3mg  on Tuesday and Saturday) INR therapeutic on admission, however d/t large INR jumps and missed doses- INR has been difficult to maintain therapeutic during this admission, may need to consider alternative dosing regimen upon patient discharge.  INR remains subtherapuetic at 1.71 after doses of 5/6/6/6 mg.  H/H stable, Plt wnl. No s/sx of bleeding noted.    Goal of Therapy:  INR 2-3 Monitor platelets by anticoagulation protocol: Yes   Plan:  - Give warfarin 8.5 mg x1 tonight - Monitor daily INR, CBC q72h  - follow for s/sx of bleeding   Hazle NordmannKelsy Combs, PharmD Pharmacy Resident [phone] 03/08/2015 9:44 AM

## 2015-03-08 NOTE — Discharge Instructions (Signed)
Follow with Primary MD  after discharge from SNF.  Get CBC, CMP, 2 view Chest X ray checked  by Primary MD next visit.    Activity: As tolerated with Full fall precautions use walker/cane & assistance as needed   Disposition SNF   Diet: Heart Healthy  , with feeding assistance and aspiration precautions.  For Heart failure patients - Check your Weight same time everyday, if you gain over 2 pounds, or you develop in leg swelling, experience more shortness of breath or chest pain, call your Primary MD immediately. Follow Cardiac Low Salt Diet and 1.5 lit/day fluid restriction.   On your next visit with your primary care physician please Get Medicines reviewed and adjusted.   Please request your Prim.MD to go over all Hospital Tests and Procedure/Radiological results at the follow up, please get all Hospital records sent to your Prim MD by signing hospital release before you go home.   If you experience worsening of your admission symptoms, develop shortness of breath, life threatening emergency, suicidal or homicidal thoughts you must seek medical attention immediately by calling 911 or calling your MD immediately  if symptoms less severe.  You Must read complete instructions/literature along with all the possible adverse reactions/side effects for all the Medicines you take and that have been prescribed to you. Take any new Medicines after you have completely understood and accpet all the possible adverse reactions/side effects.   Do not drive, operating heavy machinery, perform activities at heights, swimming or participation in water activities or provide baby sitting services if your were admitted for syncope or siezures until you have seen by Primary MD or a Neurologist and advised to do so again.  Do not drive when taking Pain medications.    Do not take more than prescribed Pain, Sleep and Anxiety Medications  Special Instructions: If you have smoked or chewed Tobacco  in the  last 2 yrs please stop smoking, stop any regular Alcohol  and or any Recreational drug use.  Wear Seat belts while driving.   Please note  You were cared for by a hospitalist during your hospital stay. If you have any questions about your discharge medications or the care you received while you were in the hospital after you are discharged, you can call the unit and asked to speak with the hospitalist on call if the hospitalist that took care of you is not available. Once you are discharged, your primary care physician will handle any further medical issues. Please note that NO REFILLS for any discharge medications will be authorized once you are discharged, as it is imperative that you return to your primary care physician (or establish a relationship with a primary care physician if you do not have one) for your aftercare needs so that they can reassess your need for medications and monitor your lab values.  Warfarin tablets What is this medicine? WARFARIN (WAR far in) is an anticoagulant. It is used to treat or prevent clots in the veins, arteries, lungs, or heart. This medicine may be used for other purposes; ask your health care provider or pharmacist if you have questions. What should I tell my health care provider before I take this medicine? They need to know if you have any of these conditions: -alcoholism -anemia -bleeding disorders -cancer -diabetes -heart disease -high blood pressure -history of bleeding in the gastrointestinal tract -history of stroke or other brain injury or disease -kidney or liver disease -protein C deficiency -protein S deficiency -psychosis or  dementia -recent injury, recent or planned surgery or procedure -an unusual or allergic reaction to warfarin, other medicines, foods, dyes, or preservatives -pregnant or trying to get pregnant -breast-feeding How should I use this medicine? Take this medicine by mouth with a glass of water. Follow the  directions on the prescription label. You can take this medicine with or without food. Take your medicine at the same time each day. Do not take it more often than directed. Do not stop taking except on your doctor's advice. Stopping this medicine may increase your risk of a blood clot. Be sure to refill your prescription before you run out of medicine. If your doctor or healthcare professional calls to change your dose, write down the dose and any other instructions. Always read the dose and instructions back to him or her to make sure you understand them. Tell your doctor or healthcare professional what strength of tablets you have on hand. Ask how many tablets you should take to equal your new dose. Write the date on the new instructions and keep them near your medicine. If you are told to stop taking your medicine until your next blood test, call your doctor or healthcare professional if you do not hear anything within 24 hours of the test to find out your new dose or when to restart your prior dose. A special MedGuide will be given to you by the pharmacist with each prescription and refill. Be sure to read this information carefully each time. Talk to your pediatrician regarding the use of this medicine in children. Special care may be needed. Overdosage: If you think you have taken too much of this medicine contact a poison control center or emergency room at once. NOTE: This medicine is only for you. Do not share this medicine with others. What if I miss a dose? It is important not to miss a dose. If you miss a dose, call your healthcare provider. Take the dose as soon as possible on the same day. If it is almost time for your next dose, take only that dose. Do not take double or extra doses to make up for a missed dose. What may interact with this medicine? Do not take this medicine with any of the following medications: -agents that prevent or dissolve blood clots -aspirin or other  salicylates -danshen -dextrothyroxine -mifepristone -St. John's Wort -red yeast rice This medicine may also interact with the following medications: -acetaminophen -agents that lower cholesterol -alcohol -allopurinol -amiodarone -antibiotics or medicines for treating bacterial, fungal or viral infections -azathioprine -barbiturate medicines for inducing sleep or treating seizures -certain medicines for diabetes -certain medicines for heart rhythm problems -certain medicines for high blood pressure -chloral hydrate -cisapride -disulfiram -male hormones, including contraceptive or birth control pills -general anesthetics -herbal or dietary products like garlic, ginkgo, ginseng, green tea, or kava kava -influenza virus vaccine -male hormones -medicines for mental depression or psychosis -medicines for some types of cancer -medicines for stomach problems -methylphenidate -NSAIDs, medicines for pain and inflammation, like ibuprofen or naproxen -propoxyphene -quinidine, quinine -raloxifene -seizure or epilepsy medicine like carbamazepine, phenytoin, and valproic acid -steroids like cortisone and prednisone -tamoxifen -thyroid medicine -tramadol -vitamin c, vitamin e, and vitamin K -zafirlukast -zileuton This list may not describe all possible interactions. Give your health care provider a list of all the medicines, herbs, non-prescription drugs, or dietary supplements you use. Also tell them if you smoke, drink alcohol, or use illegal drugs. Some items may interact with your medicine. What should I  watch for while using this medicine? Visit your doctor or health care professional for regular checks on your progress. You will need to have a blood test called a PT/INR regularly. The PT/INR blood test is done to make sure you are getting the right dose of this medicine. It is important to not miss your appointment for the blood tests. When you first start taking this medicine,  these tests are done often. Once the correct dose is determined and you take your medicine properly, these tests can be done less often. Notify your doctor or health care professional and seek emergency treatment if you develop breathing problems; changes in vision; chest pain; severe, sudden headache; pain, swelling, warmth in the leg; trouble speaking; sudden numbness or weakness of the face, arm or leg. These can be signs that your condition has gotten worse. While you are taking this medicine, carry an identification card with your name, the name and dose of medicine(s) being used, and the name and phone number of your doctor or health care professional or person to contact in an emergency. Do not start taking or stop taking any medicines or over-the-counter medicines except on the advice of your doctor or health care professional. You should discuss your diet with your doctor or health care professional. Do not make major changes in your diet. Vitamin K can affect how well this medicine works. Many foods contain vitamin K. It is important to eat a consistent amount of foods with vitamin K. Other foods with vitamin K that you should eat in consistent amounts are asparagus, basil, beef or pork liver, black eyed peas, broccoli, brussel sprouts, cabbage, chickpeas, cucumber with peel, green onions, green tea, okra, parsley, peas, thyme, and green leafy vegetables like beet greens, collard greens, endive, kale, mustard greens, spinach, turnip greens, watercress, or certain lettuces like green leaf or romaine. This medicine can cause birth defects or bleeding in an unborn child. Women of childbearing age should use effective birth control while taking this medicine. If a woman becomes pregnant while taking this medicine, she should discuss the potential risks and her options with her health care professional. Avoid sports and activities that might cause injury while you are using this medicine. Severe falls or  injuries can cause unseen bleeding. Be careful when using sharp tools or knives. Consider using an Neurosurgeon. Take special care brushing or flossing your teeth. Report any injuries, bruising, or red spots on the skin to your doctor or health care professional. If you have an illness that causes vomiting, diarrhea, or fever for more than a few days, contact your doctor. Also check with your doctor if you are unable to eat for several days. These problems can change the effect of this medicine. Even after you stop taking this medicine, it takes several days before your body recovers its normal ability to clot blood. Ask your doctor or health care professional how long you need to be careful. If you are going to have surgery or dental work, tell your doctor or health care professional that you have been taking this medicine. What side effects may I notice from receiving this medicine? Side effects that you should report to your doctor or health care professional as soon as possible: -back pain -chills -dizziness -fever -heavy menstrual bleeding or vaginal bleeding -painful, blue, or purple toes -painful, prolonged erection -signs and symptoms of bleeding such as bloody or black, tarry stools; red or dark-brown urine; spitting up blood or brown material that looks  like coffee grounds; red spots on the skin; unusual bruising or bleeding from the eye, gums, or nose-skin rash, itching or skin damage -stomach pain -unusually weak or tired -yellowing of skin or eyes Side effects that usually do not require medical attention (report to your doctor or health care professional if they continue or are bothersome): -diarrhea -hair loss This list may not describe all possible side effects. Call your doctor for medical advice about side effects. You may report side effects to FDA at 1-800-FDA-1088. Where should I keep my medicine? Keep out of the reach of children. Store at room temperature between 15 and  30 degrees C (59 and 86 degrees F). Protect from light. Throw away any unused medicine after the expiration date. Do not flush down the toilet. NOTE: This sheet is a summary. It may not cover all possible information. If you have questions about this medicine, talk to your doctor, pharmacist, or health care provider.    2016, [employer] Standard. (2012-09-16 12:17:56)

## 2015-03-09 ENCOUNTER — Non-Acute Institutional Stay (SKILLED_NURSING_FACILITY): Payer: Medicare PPO | Admitting: Adult Health

## 2015-03-09 ENCOUNTER — Encounter: Payer: Self-pay | Admitting: Adult Health

## 2015-03-09 DIAGNOSIS — N183 Chronic kidney disease, stage 3 unspecified: Secondary | ICD-10-CM

## 2015-03-09 DIAGNOSIS — I482 Chronic atrial fibrillation, unspecified: Secondary | ICD-10-CM

## 2015-03-09 DIAGNOSIS — D638 Anemia in other chronic diseases classified elsewhere: Secondary | ICD-10-CM | POA: Diagnosis not present

## 2015-03-09 DIAGNOSIS — B954 Other streptococcus as the cause of diseases classified elsewhere: Secondary | ICD-10-CM

## 2015-03-09 DIAGNOSIS — I5032 Chronic diastolic (congestive) heart failure: Secondary | ICD-10-CM

## 2015-03-09 DIAGNOSIS — A491 Streptococcal infection, unspecified site: Secondary | ICD-10-CM

## 2015-03-09 DIAGNOSIS — R5381 Other malaise: Secondary | ICD-10-CM

## 2015-03-09 DIAGNOSIS — Z7901 Long term (current) use of anticoagulants: Secondary | ICD-10-CM

## 2015-03-09 DIAGNOSIS — G2581 Restless legs syndrome: Secondary | ICD-10-CM | POA: Diagnosis not present

## 2015-03-09 DIAGNOSIS — E785 Hyperlipidemia, unspecified: Secondary | ICD-10-CM | POA: Diagnosis not present

## 2015-03-09 DIAGNOSIS — I38 Endocarditis, valve unspecified: Secondary | ICD-10-CM

## 2015-03-09 DIAGNOSIS — M4647 Discitis, unspecified, lumbosacral region: Secondary | ICD-10-CM | POA: Diagnosis not present

## 2015-03-10 ENCOUNTER — Non-Acute Institutional Stay (SKILLED_NURSING_FACILITY): Payer: Medicare PPO | Admitting: Internal Medicine

## 2015-03-10 DIAGNOSIS — I482 Chronic atrial fibrillation, unspecified: Secondary | ICD-10-CM

## 2015-03-10 DIAGNOSIS — K219 Gastro-esophageal reflux disease without esophagitis: Secondary | ICD-10-CM

## 2015-03-10 DIAGNOSIS — I339 Acute and subacute endocarditis, unspecified: Secondary | ICD-10-CM | POA: Diagnosis not present

## 2015-03-10 DIAGNOSIS — I5032 Chronic diastolic (congestive) heart failure: Secondary | ICD-10-CM | POA: Diagnosis not present

## 2015-03-10 DIAGNOSIS — R5381 Other malaise: Secondary | ICD-10-CM | POA: Diagnosis not present

## 2015-03-10 DIAGNOSIS — N183 Chronic kidney disease, stage 3 unspecified: Secondary | ICD-10-CM

## 2015-03-10 DIAGNOSIS — B954 Other streptococcus as the cause of diseases classified elsewhere: Secondary | ICD-10-CM | POA: Diagnosis not present

## 2015-03-10 DIAGNOSIS — D638 Anemia in other chronic diseases classified elsewhere: Secondary | ICD-10-CM

## 2015-03-10 DIAGNOSIS — M4647 Discitis, unspecified, lumbosacral region: Secondary | ICD-10-CM

## 2015-03-10 DIAGNOSIS — M1990 Unspecified osteoarthritis, unspecified site: Secondary | ICD-10-CM

## 2015-03-10 DIAGNOSIS — E876 Hypokalemia: Secondary | ICD-10-CM | POA: Diagnosis not present

## 2015-03-10 DIAGNOSIS — K5900 Constipation, unspecified: Secondary | ICD-10-CM

## 2015-03-10 DIAGNOSIS — E46 Unspecified protein-calorie malnutrition: Secondary | ICD-10-CM

## 2015-03-10 DIAGNOSIS — A491 Streptococcal infection, unspecified site: Secondary | ICD-10-CM

## 2015-03-13 NOTE — Progress Notes (Signed)
Patient ID: Ethan Lawrence, male   DOB: May 10, 1922, 82 y.o.   MRN: [id number]      [facility]   PCP: PROVIDER NOT IN SYSTEM  Code Status: DNR  Allergies  Allergen Reactions  . Tape Other (See Comments)    Pulls skin off  . Vancomycin Other (See Comments)    Hearing loss    Chief Complaint  Patient presents with  . New Admit To SNF     HPI:  82 y.o. patient is here for short term rehabilitation post hospital admission from 02/25/15-03/08/15 with acute onset low back pain, sepsis from streptococcus viridans bacteremia and endocarditis. He was started on antibiotics and had a picc line placed. There has been concern for discitis. MRI could not be performed due to pacemaker. He has PMH of CAD, CHF,Chronic Atrial Fib, AV Block S/P Pacemaker, AS, AAA S/P Repair, HTN among others. He is seen in his room today with his daughter present. He is upset as he lost his cellphone this am in the facility. His pain is under control with current regimen. He feels weak and gets tired easily but feels his energy to be returning slowly.    Review of Systems:  Constitutional: Negative for fever, chills, diaphoresis.  HENT: Negative for headache, congestion, nasal discharge, difficulty swallowing.   Eyes: Negative for eye pain, blurred vision, double vision and discharge.  Respiratory: Negative for cough, shortness of breath and wheezing.   Cardiovascular: Negative for chest pain, palpitations, leg swelling.  Gastrointestinal: Negative for heartburn, nausea, vomiting, abdominal pain. Had bowel movement this am. has been straining. Genitourinary: Negative for dysuria  Musculoskeletal: Negative for falls Skin: Negative for itching, rash.  Neurological: Negative for dizziness Psychiatric/Behavioral: Negative for depression   Past Medical History  Diagnosis Date  . Hypertension   . Hyperlipidemia   . CAD (coronary artery disease)     a. 05/2004 CABGx4  (LIMA->LAD, VG->RI, VG->OM, VG->RCA);  b. 08/2012 Cath: Native 3VD with 3/4 patent grafts (VG->RI occluded).  . Atrial fibrillation ([place])   . Macular degeneration   . Aortic stenosis     a. 08/2012 TEE: EF 45-55%, critical AS, Triv AI, mild to mod MR, Sev TR;  b. 08/2012 AVR (45m Magna Ease pericardial tissue valve) & TV Repair (353mMC3 annuloplasty ring).  . Mar[place] KitchenAA (abdominal [place]    a. 06/2010 s/p Endovascular AAA Repair  . [place] heart block[place]    s/p MDT pacemaker implant by Dr TaLovena Le. Pain     PT HAS A LOT OF LOWER BACK PAIN- PREVIOUS BACK SURGERY AND DJD  . Pacemaker   . Shortness of breath     MOSTLY WITH EXERTION - IMPROVED SINCE HEART VALVE REPLACEMENT  . Arthritis     LEFT KNEE OA AND PAIN;  DJD LUMBAR  . Anemia requiring transfusions     MOST RECENT WAS JUNE 2014 WITH HEART SURGERY  . Complication of anesthesia     STATES HE DOES WELL WITH DIPROVAN  . Blood transfusion without reported diagnosis   . Cataract   . [facility]est[facility]) [facility]   Past Surgical History  Procedure Laterality Date  . Endovascular stent insertion  07/05/2010  . Appendectomy    . Coronary artery bypass graft    . Vascular surgery    . Back surgery    . Cardiac catheterization    . Esophagogastroduodenoscopy  06/19/2011    Procedure: ESOPHAGOGASTRODUODENOSCOPY (EGD);  Surgeon: Wonda Horner, MD;  Location: [place];  Service: Gastroenterology;  Laterality: N/A;  . Colonoscopy  06/19/2011    Procedure: COLONOSCOPY;  Surgeon: Wonda Horner, MD;  Location: [place];  Service: Gastroenterology;  Laterality: N/A;  . Eye surgery      bilater cataracts removed  . Aortic valve replacement N/A 09/01/2012    Procedure: REDO AORTIC VALVE REPLACEMENT (AVR);  Surgeon: Ivin Poot, MD;  Location: [place];  Service: Open Heart Surgery;  Laterality: N/A;  . Tricuspid valve replacement N/A 09/01/2012    Procedure: TRICUSPID VALVE REPAIR;  Surgeon: Ivin Poot,  MD;  Location: [place];  Service: Open Heart Surgery;  Laterality: N/A;  . Intraoperative transesophageal echocardiogram N/A 09/01/2012    Procedure: INTRAOPERATIVE TRANSESOPHAGEAL ECHOCARDIOGRAM;  Surgeon: Ivin Poot, MD;  Location: [place];  Service: Open Heart Surgery;  Laterality: N/A;  . Pacemaker insertion  09/07/2012    Medtronic Sensia single chamber pacemaker implanted by Dr Lovena Le   . Surgery about 50 yrs ago for perforated ulcer and repair of hiatal hernia    . Total knee arthroplasty Left 02/15/2013    Procedure: LEFT TOTAL KNEE ARTHROPLASTY;  Surgeon: Mauri Pole, MD;  Location: [facility];  Service: Orthopedics;  Laterality: Left;  . Permanent pacemaker insertion N/A 09/07/2012    Procedure: PERMANENT PACEMAKER INSERTION;  Surgeon: Evans Lance, MD;  Location: [facility] CATH LAB;  Service: Cardiovascular;  Laterality: N/A;  . Hernia repair    . Joint replacement    . Spine surgery    . Cardiac valve replacement    . Prostate surgery    . Tee without cardioversion N/A 02/27/2015    Procedure: TRANSESOPHAGEAL ECHOCARDIOGRAM (TEE);  Surgeon: Skeet Latch, MD;  Location: [facility] ENDOSCOPY;  Service: Cardiovascular;  Laterality: N/A;   Social History:   reports that he quit smoking about 39 years ago. He has never used smokeless tobacco. He reports that he does not drink alcohol or use illicit drugs.  Family History  Problem Relation Age of Onset  . Coronary artery disease Brother   . Healthy Mother   . Emphysema Father   . Healthy Sister   . Alzheimer's disease Brother     Medications:   Medication List       This list is accurate as of: 03/10/15 11:59 PM.  Always use your most recent med list.               acetaminophen 325 MG tablet  Commonly known as:  TYLENOL  Take 2 tablets (650 mg total) by mouth every 6 (six) hours as needed for mild pain (or Fever >/= 101).     acidophilus Caps capsule  Take 2 capsules by mouth daily.     cefTRIAXone 2 g in dextrose 5 % 50 mL    Inject 2 g into the vein daily. Patient to continue IV Rocephin total of 8 weeks with stop date 9/93/7169     folic acid 1 MG tablet  Commonly known as:  FOLVITE  Take 1 mg by mouth daily.     furosemide 20 MG tablet  Commonly known as:  LASIX  Take 20 mg by mouth daily as needed (swelling).     gabapentin 300 MG capsule  Commonly known as:  NEURONTIN  Take 300 mg by mouth 2 (two) times daily.     methocarbamol 500 MG tablet  Commonly known as:  ROBAXIN  Take 1 tablet (500 mg total) by mouth  every 6 (six) hours as needed for muscle spasms.     ondansetron 4 MG tablet  Commonly known as:  ZOFRAN  Take 1 tablet (4 mg total) by mouth every 6 (six) hours as needed for nausea.     oxyCODONE 5 MG immediate release tablet  Commonly known as:  Oxy IR/ROXICODONE  Take 1 tablet (5 mg total) by mouth every 6 (six) hours as needed for breakthrough pain (Please give prior to physical therapy).     predniSONE 5 MG tablet  Commonly known as:  DELTASONE  Take 1 tablet (5 mg total) by mouth daily with breakfast.     PRESERVISION AREDS 2 Caps  Take 1 tablet by mouth 2 (two) times daily. Reported on 02/23/2015     PRILOSEC 20 MG capsule  Generic drug:  omeprazole  Take 20 mg by mouth daily.     rOPINIRole 1 MG tablet  Commonly known as:  REQUIP  Take 1 mg by mouth daily.     senna 8.6 MG Tabs tablet  Commonly known as:  SENOKOT  Take 2 tablets (17.2 mg total) by mouth at bedtime.     simvastatin 20 MG tablet  Commonly known as:  ZOCOR  Take 20 mg by mouth at bedtime.     traMADol 50 MG tablet  Commonly known as:  ULTRAM  Take 0.5 tablets (25 mg total) by mouth daily.     warfarin 4 MG tablet  Commonly known as:  COUMADIN  Take 2 tablets (8 mg total) by mouth one time only at 6 PM. On 12/28     warfarin 6 MG tablet  Commonly known as:  COUMADIN  Take 1 tablet (6 mg total) by mouth daily at 6 PM. Starting 12/29         Physical Exam: Filed Vitals:   03/10/15 1347  BP:  140/74  Pulse: 86  Temp: 98.9 F (37.2 C)  Resp: 16  SpO2: 95%    General- elderly male, well built, in no acute distress Head- normocephalic, atraumatic Nose- normal nasal mucosa Throat- moist mucus membrane, upper dentures Eyes- PERRLA, EOMI, no pallor, no icterus, no discharge, normal conjunctiva, normal sclera Neck- no cervical lymphadenopathy Cardiovascular- normal s1,s2, no murmurs, no leg edema Respiratory- bilateral clear to auscultation, no wheeze, no rhonchi, no crackles, no use of accessory muscles Abdomen- bowel sounds present, soft, non tender Musculoskeletal- able to move all 4 extremities, generalized weakness  Neurological- no focal deficit, alert and oriented to person, place and time Skin- warm and dry, PICC line in place Psychiatry- normal mood and affect    Labs reviewed: Basic Metabolic Panel:  Recent Labs  03/01/15 0614 03/03/15 0402 03/05/15 0445  NA 139 139 138  K 4.4 3.5 3.3*  CL 106 104 101  CO2 24 25 28   GLUCOSE 98 99 99  BUN 18 20 18   CREATININE 1.48* 1.51* 1.57*  CALCIUM 9.0 8.7* 8.8*   Liver Function Tests:  Recent Labs  02/25/15 2045  AST 17  ALT 10*  ALKPHOS 86  BILITOT 0.8  PROT 6.6  ALBUMIN 3.4*   No results for input(s): LIPASE, AMYLASE in the last 8760 hours. No results for input(s): AMMONIA in the last 8760 hours. CBC:  Recent Labs  02/21/15 1615  02/25/15 2045  03/02/15 0922 03/05/15 0445 03/08/15 0533  WBC 4.2  < > 5.4  < > 6.5 5.4 5.0  NEUTROABS 2.9  --  4.3  --   --   --   --  HGB 9.9*  < > 9.3*  < > 10.0* 9.1* 9.1*  HCT 31.5*  < > 29.3*  < > 31.4* 29.0* 30.1*  MCV 92.1  < > 91.8  < > 92.1 92.1 92.0  PLT 99*  --  102*  < > 186 205 196  < > = values in this interval not displayed. Cardiac Enzymes: No results for input(s): CKTOTAL, CKMB, CKMBINDEX, TROPONINI in the last 8760 hours. BNP: Invalid input(s): POCBNP CBG:  Recent Labs  03/06/15 2124 03/07/15 0619  GLUCAP 92 93    Radiological  Exams: Dg Chest 1 View  02/25/2015  CLINICAL DATA:  82 year old male with back pain for 6 hours. EXAM: CHEST 1 VIEW COMPARISON:  Chest radiograph 01/20/2013. Included lung bases from CT abdomen/pelvis performed concurrently. FINDINGS: Single lead left-sided pacemaker remains in place. Patient is post median sternotomy with 2 prosthetic valves. Multi chamber cardiomegaly, progressed from prior exam. There is tortuosity and atherosclerosis of the thoracic aorta. Vascular congestion with possible mild perihilar edema. Calcified granuloma in the right upper lung. Small pleural effusions on CT are not seen radiographically. No confluent airspace disease. No pneumothorax. IMPRESSION: Cardiomegaly with vascular congestion and questionable perihilar edema. Recommend correlation for CHF. Electronically Signed   By: Jeb Levering M.D.   On: 02/25/2015 22:30   Dg Thoracic Spine 2 View  02/25/2015  CLINICAL DATA:  Lower back pain since 4:30 this afternoon. History of chronic back pain. No injury noted. EXAM: THORACIC SPINE 2 VIEWS COMPARISON:  Chest x-ray dated 01/20/2013. FINDINGS: Portions of the thoracic spine are incompletely seen due to overlying osseous and soft tissue structures. Overall osseous alignment appears normal and grossly stable compared to a previous chest x-ray of 01/20/2013. No osseous fracture or dislocation seen. No acute - appearing cortical irregularity or osseous lesion. Paravertebral soft tissues are unremarkable for acute process. IMPRESSION: No acute findings. Electronically Signed   By: Franki Cabot M.D.   On: 02/25/2015 22:29   Dg Lumbar Spine Complete  02/25/2015  CLINICAL DATA:  82 year old male with lumbosacral back pain for 6 hours. EXAM: LUMBAR SPINE - COMPLETE 4+ VIEW COMPARISON:  Reformats from CT abdomen/ pelvis 09/02/2011. FINDINGS: Kyphoplasty within L4 compression deformity. Remaining vertebral body heights are maintained. Mild levoscoliosis centered at L2-L3 with  associated degenerative disc disease. Additional disc space narrowing at L1-L2. The bones are under mineralized. Aorto bi-iliac stent in place. IMPRESSION: 1. Kyphoplasty within L4 compression deformity. 2. Scoliosis and degenerative change.  No acute bony abnormality. Electronically Signed   By: Jeb Levering M.D.   On: 02/25/2015 22:26   Ct Abdomen Pelvis W Contrast  02/25/2015  CLINICAL DATA:  Severe low back pain with any movement. History of chronic back pain. Abdominal pain and tenderness. EXAM: CT ABDOMEN AND PELVIS WITH CONTRAST TECHNIQUE: Multidetector CT imaging of the abdomen and pelvis was performed using the standard protocol following bolus administration of intravenous contrast. CONTRAST:  11m OMNIPAQUE IOHEXOL 300 MG/ML  SOLN COMPARISON:  09/02/2011 FINDINGS: Small bilateral pleural effusions. Multiple surgical clips at the EG junction. Postoperative changes in the mediastinum. Surgical absence of the gallbladder. Calcified granulomas scattered throughout the liver and spleen. No hepatosplenomegaly. The pancreas, adrenal glands, inferior vena cava, and retroperitoneal lymph nodes are unremarkable. Abdominal aortic aneurysm post aortoiliac stent grafts. Native sac diameter is 5.6 cm. This measurement is similar to the previous study. Kidneys are atrophic bilaterally. Renal nephrograms appear symmetrical. No hydronephrosis. Large cysts in both kidneys, largest is off of the lower pole of  the left kidney and measures about 8.9 cm diameter. This cyst is increased in size since the previous study. The stomach, small bowel, and colon are not abnormally distended. The colon is diffusely stool-filled. No free air or free fluid in the abdomen. Surgical clips in the anterior abdominal wall. Pelvis: Prostate gland is not enlarged. Prostate defect may indicate previous TUR procedure. Bladder wall is not thickened. Appendix is surgically absent. No free or loculated pelvic fluid collections. No pelvic  mass or lymphadenopathy. Vascular calcifications in the pelvis. Fat in the inguinal canals. Degenerative changes in the lumbar spine. L4 compression deformity post kyphoplasty. No destructive bone lesions. IMPRESSION: No significant acute process demonstrated in the abdomen or pelvis. There are small bilateral pleural effusions. Postoperative changes demonstrated throughout the chest, abdomen, and pelvis. Abdominal aortic aneurysm post stent graft. Native sac diameter is 5.6 cm without change since prior study. Multiple renal cysts with enlarging large cyst on the left kidney. Electronically Signed   By: Lucienne Capers M.D.   On: 02/25/2015 22:42    TEE Impressions: - There is a 8 mm x 4 mm mobile target on the atrial side of the mitral valve at the region of the P3 segment that is concerning   for endocarditis given the clinical scenario. Cannot rule out ruptured cord. There is no associated valve destruction and the mitral regurgitation appears to be separate from the suspected lesion. There is also a mobile target on the superior surface of the   prosthetic aortic valve sewing ring. The aortic valve gradients are moderately elevated, consistent with moderate steonis, as seen on transthoracic echo. The valve is well-seated and there is no rocking motion. There is no aortic reguritation. No vegetations were noted on the pacer lead.    Assessment/Plan  Physical deconditioning Will have him work with physical therapy and occupational therapy team to help with gait training and muscle strengthening exercises.fall precautions. Skin care. Encourage to be out of bed.   Streptococcus viridans bacteremia Continue rocephin 2 g daily for now and patient is to be on this for atleast 8 weeks. Has f/u with ID. Will get his cbc with diff and cmp monitored. Monitor temperature curve and continue picc line care  Endocarditis Started on antibiotics and followed by ID. Continue and complete his antibiotic  course, monitor temperature and wbc curve  Lumbar discitis Continue and complete antibiotic course as above. Continue norco 10-325 mg 1- one and a half tab q4h prn pain with  tramadol as well. Continue robaxin 500 mg q6h prn muscle spasm.  Protein calorie malnutrition Monitor po intake, encourage po intake, monitor weight. Currently on mechanical soft diet but has no difficulty swallowing and as per daughter, was eating regular food at hospital. Change his diet to regular consistency cardiac diet and monitor  ckd stage 3 Monitor renal function  Anemia of chronic disease Monitor cbc  Hypokalemia Check bmp and add kcl supplement if needed  Arthritis Has been on chronic prednisone 5 mg daily  afib Rate controlled. inr 2.2 on 03/09/15. Continue coumadin 6 mg daily and check inr 03/14/15  Neuropathy Continue gabapentin 300 mg bid  gerd Continue prilosec 20 mg daily  Constipation Will have him on senna 2 tab qhs for now and monitor  Chronic diastolic heart failure Continue prn lasix, monitor BMP    Goals of care: short term rehabilitation   Labs/tests ordered: cbc, cmp, crp and esr weekly, inr  Family/ staff Communication: reviewed care plan with patient and nursing  supervisor    Blanchie Serve, MD  [facility] [phone] (Monday-Friday 8 am - 5 pm) [phone] (afterhours)

## 2015-03-23 ENCOUNTER — Non-Acute Institutional Stay (SKILLED_NURSING_FACILITY): Payer: Medicare PPO | Admitting: Adult Health

## 2015-03-23 ENCOUNTER — Encounter: Payer: Self-pay | Admitting: Adult Health

## 2015-03-23 DIAGNOSIS — K219 Gastro-esophageal reflux disease without esophagitis: Secondary | ICD-10-CM

## 2015-03-23 DIAGNOSIS — E46 Unspecified protein-calorie malnutrition: Secondary | ICD-10-CM

## 2015-03-23 DIAGNOSIS — D638 Anemia in other chronic diseases classified elsewhere: Secondary | ICD-10-CM

## 2015-03-23 DIAGNOSIS — I482 Chronic atrial fibrillation, unspecified: Secondary | ICD-10-CM

## 2015-03-23 DIAGNOSIS — G2581 Restless legs syndrome: Secondary | ICD-10-CM | POA: Diagnosis not present

## 2015-03-23 DIAGNOSIS — E785 Hyperlipidemia, unspecified: Secondary | ICD-10-CM | POA: Diagnosis not present

## 2015-03-23 DIAGNOSIS — M4647 Discitis, unspecified, lumbosacral region: Secondary | ICD-10-CM

## 2015-03-23 DIAGNOSIS — N183 Chronic kidney disease, stage 3 unspecified: Secondary | ICD-10-CM

## 2015-03-23 DIAGNOSIS — B954 Other streptococcus as the cause of diseases classified elsewhere: Secondary | ICD-10-CM

## 2015-03-23 DIAGNOSIS — I339 Acute and subacute endocarditis, unspecified: Secondary | ICD-10-CM

## 2015-03-23 DIAGNOSIS — I5032 Chronic diastolic (congestive) heart failure: Secondary | ICD-10-CM

## 2015-03-23 DIAGNOSIS — R5381 Other malaise: Secondary | ICD-10-CM | POA: Diagnosis not present

## 2015-03-23 DIAGNOSIS — A491 Streptococcal infection, unspecified site: Secondary | ICD-10-CM

## 2015-03-23 NOTE — Progress Notes (Signed)
Patient ID: Ethan Lawrence, male   DOB: 1922/06/08, 82 y.o.   MRN: [id number]    DATE:  03/23/15  MRN:  [id number]  BIRTHDAY: 1923-01-25  Facility:  [facility]ursing Home Location:  [place] Room Number: 1008-P  LEVEL OF CARE:  [id number]  Contact Information    Name Relation Home Work [place] Daughter [phone]  [phone]   [name],Bill Relative [phone]  [phone]       Code Status History    Date Active Date Inactive Code Status Order ID Comments User Context   02/26/2015  6:59 AM 03/08/2015  7:52 PM Full Code [id number]  Theressa Millard, MD Inpatient   02/15/2013 11:03 AM 02/18/2013  2:08 PM Full Code [id number]  Pricilla Loveless, PA-C Inpatient   09/01/2012  4:32 PM 09/13/2012  1:07 PM Full Code [id number]  Coolidge Breeze, PA-C Inpatient       Chief Complaint  Patient presents with  . Discharge Note    Physical deconditioning, Streptococcus. Dense bacteremia/endocarditis, lumbar discitis, anemia, chronic diastolic heart failure, chronic kidney disease is stage III, chronic atrial fibrillation, RLS and hyperlipidemia    HISTORY OF PRESENT ILLNESS:  This is a 82 year old male who is for discharge home with Home health PT for endurance, skilled Nurse for medication management (IV treatment/Rocephin 2 gm daily until 04/23/15 and PICC care, and CNA for showers.  has been admitted to [facility] on 03/08/15 from [facility]. He has PMH of CAD, CHF, chronic atrial fibrillation on Coumadin therapy, AV block S/P pacemaker, AS, AAA S/P repair and hypertension. He had sudden onset of intractable 10/10 is spasmodic low back pain. He was given IV Rocephin. A CT scan of  ABD/pelvis was negative for acute findings. MRI could not be performed due to his pacemaker. He was also given IV vancomycin, IV Decadron and IV Robaxin with pain medications. He has been having up to 103 temp in the ED. Blood culture showed  Streptococcus viridans and ID was consulted. He underwent TEE which showed endocarditis. Right inguinal area was very tender and surgery evaluated with out need for acute interventions. He will continue Rocephin 8 weeks per ID recommendations.  Patient was admitted to this facility for short-term rehabilitation after the patient's recent hospitalization.  Patient has completed SNF rehabilitation and therapy has cleared the patient for discharge.  PAST MEDICAL HISTORY:  Past Medical History  Diagnosis Date  . Hypertension   . Hyperlipidemia   . CAD (coronary artery disease)     a. 05/2004 CABGx4 (LIMA->LAD, VG->RI, VG->OM, VG->RCA);  b. 08/2012 Cath: Native 3VD with 3/4 patent grafts (VG->RI occluded).  . Atrial fibrillation ([place])   . Macular degeneration   . Aortic stenosis     a. 08/2012 TEE: EF 45-55%, critical AS, Triv AI, mild to mod MR, Sev TR;  b. 08/2012 AVR (62m Magna Ease pericardial tissue valve) & TV Repair (320mMC3 annuloplasty ring).  . [place] KitchenAA [place]    a. 06/2010 s/p Endovascular AAA Repair  . [place]    s/p MDT pacemaker implant by Dr TaLovena Le. Pain     PT HAS A LOT OF LOWER BACK PAIN- PREVIOUS BACK SURGERY AND DJD  . Pacemaker   . Shortness of breath     MOSTLY WITH EXERTION - IMPROVED SINCE HEART VALVE REPLACEMENT  . Arthritis     LEFT KNEE OA AND PAIN;  DJD  LUMBAR  . Anemia requiring transfusions     MOST RECENT WAS JUNE 2014 WITH HEART SURGERY  . Complication of anesthesia     STATES HE DOES WELL WITH DIPROVAN  . Blood transfusion without reported diagnosis   . Cataract   . CHF (congestive heart failure) ([place])      CURRENT MEDICATIONS: Reviewed   .   Medication List       This list is accurate as of: 03/23/15  9:01 PM.  Always use your most recent med list.               cefTRIAXone 2 g in dextrose 5 % 50 mL  Inject 2 g into the vein daily. Patient to continue IV Rocephin total of 8  weeks with stop date 04/14/5595     folic acid 1 MG tablet  Commonly known as:  FOLVITE  Take 1 mg by mouth daily.     gabapentin 300 MG capsule  Commonly known as:  NEURONTIN  Take 300 mg by mouth 2 (two) times daily.     HYDROcodone-acetaminophen 10-325 MG tablet  Commonly known as:  NORCO  Take 1 tablet by mouth every 4 (four) hours as needed for moderate pain.     HYDROcodone-acetaminophen 10-325 MG tablet  Commonly known as:  NORCO  Take 1.5 tablets by mouth every 4 (four) hours as needed for severe pain.     HYDROcodone-acetaminophen 10-325 MG tablet  Commonly known as:  NORCO  Take 1 tablet by mouth at bedtime.     PRILOSEC 20 MG capsule  Generic drug:  omeprazole  Take 20 mg by mouth daily.     PROCEL PO  Take 1 scoop by mouth 2 (two) times daily.     rOPINIRole 1 MG tablet  Commonly known as:  REQUIP  Take 1 mg by mouth daily.     senna 8.6 MG Tabs tablet  Commonly known as:  SENOKOT  Take 2 tablets (17.2 mg total) by mouth at bedtime.     simvastatin 20 MG tablet  Commonly known as:  ZOCOR  Take 20 mg by mouth at bedtime.                                                                                                                 Allergies  Allergen Reactions  . Tape Other (See Comments)    Pulls skin off  . Vancomycin Other (See Comments)    Hearing loss     REVIEW OF SYSTEMS:  GENERAL: no change in appetite, no fatigue, no weight changes, no fever, chills or weakness EYES: Denies change in vision, dry eyes, eye pain, itching or discharge EARS: Denies change in hearing, ringing in ears, or earache NOSE: Denies nasal congestion or epistaxis MOUTH and THROAT: Denies oral discomfort, gingival pain or bleeding, pain from teeth or hoarseness   RESPIRATORY: no cough, SOB, DOE, wheezing, hemoptysis CARDIAC: no chest pain, edema or palpitations GI: no abdominal pain, diarrhea, constipation, heart burn, nausea or  vomiting  GU: Denies dysuria, frequency, hematuria, incontinence, or discharge PSYCHIATRIC: Denies feeling of depression or anxiety. No report of hallucinations, insomnia, paranoia, or agitation   PHYSICAL EXAMINATION  GENERAL APPEARANCE: Well nourished. In no acute distress. Normal body habitus SKIN:  + PICC line on right upper arm HEAD: Normal in size and contour. No evidence of trauma EYES: Lids open and close normally. No blepharitis, entropion or ectropion. PERRL. Conjunctivae are clear and sclerae are white. Lenses are without opacity EARS: Pinnae are normal. Patient hears normal voice tunes of the examiner MOUTH and THROAT: Lips are without lesions. Oral mucosa is moist and without lesions. Tongue is normal in shape, size, and color and without lesions NECK: supple, trachea midline, no neck masses, no thyroid tenderness, no thyromegaly LYMPHATICS: no LAN in the neck, no supraclavicular LAN RESPIRATORY: breathing is even & unlabored, BS CTAB CARDIAC: RRR, no murmur,no extra heart sounds, no edema; has pacemaker GI: abdomen soft, normal BS, no masses, no tenderness, no hepatomegaly, no splenomegaly EXTREMITIES:  Able to move 4 extremities PSYCHIATRIC: Alert and oriented X 3. Affect and behavior are appropriate  LABS/RADIOLOGY: Labs reviewed: 03/16/15  WBC 4.5 hemoglobin 8.4 hematocrit 28.0 MCV 94.6 platelet 163 ESR 39 sodium 140 potassium 4.1 glucose 82 BUN 25 creatinine 1.66 calcium 8.6 total protein 5.9 albumin 3.13 total bilirubin 0.32 alkaline phosphatase 85 SGOT 16 SGPT 25 03/14/15  WBC 6.3 hemoglobin 9.3 hematocrit 31.8 MCV 95.8 platelet 186 sodium 142 potassium 4.0 glucose 87 BUN 29 creatinine 1.71calcium 8.8  Basic Metabolic Panel:  Recent Labs  03/01/15 0614 03/03/15 0402 03/05/15 0445  NA 139 139 138  K 4.4 3.5 3.3*  CL 106 104 101  CO2 _0 GLUCOSE 98 99 99  BUN _1 CREATININE 1.48* 1.51* 1.57*  CALCIUM 9.0 8.7* 8.8*   Liver Function Tests:  Recent  Labs  02/25/15 2045  AST 17  ALT 10*  ALKPHOS 86  BILITOT 0.8  PROT 6.6  ALBUMIN 3.4*   CBC:  Recent Labs  02/21/15 1615  02/25/15 2045  03/02/15 0922 03/05/15 0445 03/08/15 0533  WBC 4.2  < > 5.4  < > 6.5 5.4 5.0  NEUTROABS 2.9  --  4.3  --   --   --   --   HGB 9.9*  < > 9.3*  < > 10.0* 9.1* 9.1*  HCT 31.5*  < > 29.3*  < > 31.4* 29.0* 30.1*  MCV 92.1  < > 91.8  < > 92.1 92.1 92.0  PLT 99*  --  102*  < > 186 205 196  < > = values in this interval not displayed.  CBG:  Recent Labs  03/06/15 2124 03/07/15 0619  GLUCAP 92 93      Dg Orthopantogram  02/28/2015  CLINICAL DATA:  Jaw pain EXAM: ORTHOPANTOGRAM/PANORAMIC COMPARISON:  None. FINDINGS: The patient is predominantly edentulous. On the left adjacent to the root of the second mandibular bicuspid there is a circular lucency identified suggestive of periapical abscess. IMPRESSION: Likely periapical abscess on the left in the mandible adjacent to the root of the second bicuspid. Electronically Signed   By: Inez Catalina M.D.   On: 02/28/2015 14:13   Dg Chest 1 View  02/25/2015  CLINICAL DATA:  82 year old male with back pain for 6 hours. EXAM: CHEST 1 VIEW COMPARISON:  Chest radiograph 01/20/2013. Included lung bases from CT abdomen/pelvis performed concurrently. FINDINGS: Single lead left-sided pacemaker remains in place. Patient is post median sternotomy with 2  prosthetic valves. Multi chamber cardiomegaly, progressed from prior exam. There is tortuosity and atherosclerosis of the thoracic aorta. Vascular congestion with possible mild perihilar edema. Calcified granuloma in the right upper lung. Small pleural effusions on CT are not seen radiographically. No confluent airspace disease. No pneumothorax. IMPRESSION: Cardiomegaly with vascular congestion and questionable perihilar edema. Recommend correlation for CHF. Electronically Signed   By: Jeb Levering M.D.   On: 02/25/2015 22:30   Dg Chest 2 View  03/04/2015   CLINICAL DATA:  Fever 3 days. EXAM: CHEST  2 VIEW COMPARISON:  Radiograph 03/03/2015 FINDINGS: Left-sided pacemaker single lead overlies enlarged heart cardiac silhouette. RIGHT PICC line unchanged. Exam is lordotic. No effusion, infiltrate, pneumothorax. Flowing osteophytosis of thoracic spine noted. IMPRESSION: Cardiomegaly without acute cardiopulmonary findings. Electronically Signed   By: Suzy Bouchard M.D.   On: 03/04/2015 13:00   Dg Thoracic Spine 2 View  02/25/2015  CLINICAL DATA:  Lower back pain since 4:30 this afternoon. History of chronic back pain. No injury noted. EXAM: THORACIC SPINE 2 VIEWS COMPARISON:  Chest x-ray dated 01/20/2013. FINDINGS: Portions of the thoracic spine are incompletely seen due to overlying osseous and soft tissue structures. Overall osseous alignment appears normal and grossly stable compared to a previous chest x-ray of 01/20/2013. No osseous fracture or dislocation seen. No acute - appearing cortical irregularity or osseous lesion. Paravertebral soft tissues are unremarkable for acute process. IMPRESSION: No acute findings. Electronically Signed   By: Franki Cabot M.D.   On: 02/25/2015 22:29   Dg Lumbar Spine Complete  02/25/2015  CLINICAL DATA:  82 year old male with lumbosacral back pain for 6 hours. EXAM: LUMBAR SPINE - COMPLETE 4+ VIEW COMPARISON:  Reformats from CT abdomen/ pelvis 09/02/2011. FINDINGS: Kyphoplasty within L4 compression deformity. Remaining vertebral body heights are maintained. Mild levoscoliosis centered at L2-L3 with associated degenerative disc disease. Additional disc space narrowing at L1-L2. The bones are under mineralized. Aorto bi-iliac stent in place. IMPRESSION: 1. Kyphoplasty within L4 compression deformity. 2. Scoliosis and degenerative change.  No acute bony abnormality. Electronically Signed   By: Jeb Levering M.D.   On: 02/25/2015 22:26   Ct Lumbar Spine Wo Contrast  02/28/2015  CLINICAL DATA:  Several day history of low  back pain. EXAM: CT LUMBAR SPINE WITHOUT CONTRAST TECHNIQUE: Multidetector CT imaging of the lumbar spine was performed without intravenous contrast administration. Multiplanar CT image reconstructions were also generated. COMPARISON:  CT scan 02/25/2015 FINDINGS: Remote vertebral augmentation changes at L4 a with a significant compression deformity. Mild stable retropulsion and mild canal encroachment. New line the other lumbar vertebral bodies are maintained. No acute fractures identified. S1 is not covered on this examination. Stable advanced degenerative disc disease at L3-4 along with advanced facet disease at L3-4 and L4-5. There are moderate-sized bilateral pleural effusions noted along with overlying basilar atelectasis. Stable aortoiliac stent graft. No complicating features are demonstrated. L1-2: Broad-based bulging annulus and osteophytic ridging with mild left lateral recess and left foraminal encroachment. This could irritate the left L1 nerve root. L2-3: Advanced degenerative disc disease and facet disease with a bulging annulus and osteophytic ridging. Mild flattening of the ventral thecal sac but no significant spinal or foraminal stenosis. L3-4: Wide decompressive laminectomy and vertebral augmentation changes. No significant spinal stenosis. Right-sided disc osteophyte complex with significant right foraminal stenosis likely effecting the right L3 nerve root. L4-5: Bulging annulus, osteophytic ridging and facet disease with mild spinal and bilateral lateral recess stenosis. No significant foraminal stenosis. L5-S1:  Bulging annulus but  no spinal or foraminal stenosis. IMPRESSION: 1. Remote L4 compression fracture with vertebral augmentation changes. 2. No new/acute fractures are identified. 3. Mild left lateral recess and left foraminal stenosis at L1-2. 4. Right foraminal/extra foraminal disc osteophyte complex on the right at L3-4 likely effecting the right L3 nerve root. 5. Mild spinal and  bilateral lateral recess stenosis at L4-5. 6. Bilateral moderate-sized pleural effusions. Electronically Signed   By: Marijo Sanes M.D.   On: 02/28/2015 18:40   Ct Lumbar Spine W Contrast  03/06/2015  CLINICAL DATA:  Severe right-sided low back pain extending into the right leg. Recent fevers with blood cultures positive for Streptococcus. Evaluate for discitis. History of back surgery in 2011. EXAM: CT LUMBAR SPINE WITH CONTRAST TECHNIQUE: Multidetector CT imaging of the lumbar spine was performed with intravenous contrast administration. Multiplanar CT image reconstructions were also generated. CONTRAST:  62m OMNIPAQUE IOHEXOL 300 MG/ML  SOLN COMPARISON:  Lumbar spine CT 02/28/2015.  Abdominal CT 02/25/2015. FINDINGS: The superior endplate compression fracture at L4 appears unchanged status post spinal augmentation. There is mild osseous retropulsion. The overall alignment is stable with a convex left scoliosis. There is no evidence of acute fracture, traumatic subluxation or bone destruction. Multilevel degenerative disc disease with vacuum phenomenon and endplate degeneration is unchanged. The individual disc space levels appear unchanged. There is significant right foraminal narrowing at L3-4 with probable right L3 nerve root encroachment. At L4-5, there is mild multifactorial spinal stenosis with left foraminal narrowing due to asymmetric facet hypertrophy. No paraspinal inflammatory changes or fluid collections are identified. There is diffuse atherosclerosis with dilatation of the distal aorta status post aortoiliac stent grafting. The aorta measures up to 5.8 cm in diameter. Left renal cyst is grossly unchanged. IMPRESSION: 1. No acute findings or significant changes from recent CT of 6 days prior. 2. No evidence of osteomyelitis, discitis or paraspinal inflammation. 3. Stable L4 compression fracture status post spinal augmentation. 4. Stable multilevel spondylosis with significant foraminal narrowing  on the right at L3-4 and on the left at L4-5 Electronically Signed   By: WRichardean SaleM.D.   On: 03/06/2015 10:53   Ct Abdomen Pelvis W Contrast  02/25/2015  CLINICAL DATA:  Severe low back pain with any movement. History of chronic back pain. Abdominal pain and tenderness. EXAM: CT ABDOMEN AND PELVIS WITH CONTRAST TECHNIQUE: Multidetector CT imaging of the abdomen and pelvis was performed using the standard protocol following bolus administration of intravenous contrast. CONTRAST:  78mOMNIPAQUE IOHEXOL 300 MG/ML  SOLN COMPARISON:  09/02/2011 FINDINGS: Small bilateral pleural effusions. Multiple surgical clips at the EG junction. Postoperative changes in the mediastinum. Surgical absence of the gallbladder. Calcified granulomas scattered throughout the liver and spleen. No hepatosplenomegaly. The pancreas, adrenal glands, inferior vena cava, and retroperitoneal lymph nodes are unremarkable. Abdominal aortic aneurysm post aortoiliac stent grafts. Native sac diameter is 5.6 cm. This measurement is similar to the previous study. Kidneys are atrophic bilaterally. Renal nephrograms appear symmetrical. No hydronephrosis. Large cysts in both kidneys, largest is off of the lower pole of the left kidney and measures about 8.9 cm diameter. This cyst is increased in size since the previous study. The stomach, small bowel, and colon are not abnormally distended. The colon is diffusely stool-filled. No free air or free fluid in the abdomen. Surgical clips in the anterior abdominal wall. Pelvis: Prostate gland is not enlarged. Prostate defect may indicate previous TUR procedure. Bladder wall is not thickened. Appendix is surgically absent. No free or loculated pelvic  fluid collections. No pelvic mass or lymphadenopathy. Vascular calcifications in the pelvis. Fat in the inguinal canals. Degenerative changes in the lumbar spine. L4 compression deformity post kyphoplasty. No destructive bone lesions. IMPRESSION: No  significant acute process demonstrated in the abdomen or pelvis. There are small bilateral pleural effusions. Postoperative changes demonstrated throughout the chest, abdomen, and pelvis. Abdominal aortic aneurysm post stent graft. Native sac diameter is 5.6 cm without change since prior study. Multiple renal cysts with enlarging large cyst on the left kidney. Electronically Signed   By: Lucienne Capers M.D.   On: 02/25/2015 22:42   Dg Chest Port 1 View  03/03/2015  CLINICAL DATA:  Bedside PICC placement. EXAM: PORTABLE CHEST 1 VIEW COMPARISON:  02/25/2015 and earlier. FINDINGS: Right arm PICC tip projects over the lower SVC near the cavoatrial junction. Prior sternotomy for CABG, aortic valve replacement and mitral valve replacement or annuloplasty. Stable moderate to marked cardiomegaly. 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 Calcified granuloma in the right upper lobe. Lungs otherwise clear. No pneumothorax. No visible pleural effusions. 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 IMPRESSION: 1. Right arm PICC tip projects over the lower SVC near the cavoatrial junction. 2. Stable moderate to marked cardiomegaly. No acute cardiopulmonary disease. Electronically Signed   By: Evangeline Dakin M.D.   On: 03/03/2015 09:33   [place] Cm  02/28/2015  CLINICAL DATA:  82 year old male with sinusitis and bacteremia. EXAM: CT PARANASAL SINUS LIMITED WITHOUT CONTRAST TECHNIQUE: Non-contiguous multidetector CT images of the paranasal sinuses were obtained in a single plane without contrast. COMPARISON:  None. FINDINGS: There is no acute fracture of the visualized facial bones. Bilateral cataract surgeries noted. The globes are otherwise intact. The retro-orbital fat and orbital walls are preserved. The paranasal sinuses are well aerated. No significant mucoperiosteal thickening. No air-fluid level. No soft tissue swelling noted. No acute hemorrhage identified within the  visualized brain. IMPRESSION: Unremarkable CT of the paranasal sinuses. Electronically Signed   By: Anner Crete M.D.   On: 02/28/2015 18:31    ASSESSMENT/PLAN:  Physical deconditioning - for home health PT, skilled nurse and CNA  Streptococcus viridans bacteremia/endocarditis - continue Rocephin 2 g IV daily till 04/23/15; follow-up with Dr. Nils Pyle, infectious disease,   Lumbar discitis - CT lumbar spine with no evidence of discitis, but high clinical suspicion; continue IV Rocephin till 04/23/15, prednisone 5 mg daily, Neurontin 300 mg 1 capsule twice a day, Robaxin 500 mg 1 tab by mouth every 6 hours when necessary, Norco 10/325 mg 1 - 1 1/2 tab PO every 4 hours when necessary  Anemia of chronic disease - hemoglobin 9.1; recheck 8.4; continue folic acid 1 mg daily  Chronic diastolic heart failure - continue Lasix 20 mg daily  Chronic kidney disease, stage III - creatinine 1.57; recheck 1.66   Chronic atrial fibrillation - rate controlled; continue Coumadin 5 mg daily; check INR 03/24/15 before discharge  RLS - continue Requip 1 mg daily  Hyperlipidemia - continue simvastatin 20 mg 1 tab by mouth daily at bedtime  Protein calorie malnutrition - albumin 3.13; continue Procel one scoop by mouth twice a day  GERD - continue Prilosec 20 mg 1 capsule by mouth daily     I have filled out patient's discharge paperwork and written prescriptions.  Patient will receive home health PT, skilled Nursing and CNA.  Total discharge time: Greater than 30 minutes  Discharge time involved coordination of the discharge process with Education officer, museum, nursing staff and therapy  department. Medical justification for home health services verified.     [facility], NP [employer] [phone]

## 2015-03-23 NOTE — Progress Notes (Signed)
Patient ID: Ethan Lawrence, male   DOB: 1922-05-31, 82 y.o.   MRN: [id number]    DATE:  03/09/15  MRN:  [id number]  BIRTHDAY: [birth year]  Facility:  Nursing Home Location:  [facility]  Nursing Home Room Number: 1008-P  LEVEL OF CARE:  SNF [phone])  Contact Information    Name Relation Home Work [place] Daughter [phone]  [phone]   [name],Bill Relative [phone]  [phone]       Code Status History    Date Active Date Inactive Code Status Order ID Comments User Context   02/26/2015  6:59 AM 03/08/2015  7:52 PM Full Code [id number]  Ron Parker, MD Inpatient   02/15/2013 11:03 AM 02/18/2013  2:08 PM Full Code [id number]  Gerrit Halls, PA-C Inpatient   09/01/2012  4:32 PM 09/13/2012  1:07 PM Full Code [id number]  Wilmon Pali, PA-C Inpatient       Chief Complaint  Patient presents with  . Hospitalization Follow-up    Physical deconditioning, Streptococcus. Dense bacteremia/endocarditis, lumbar discitis, anemia, chronic diastolic heart failure, chronic kidney disease is stage III, chronic atrial fibrillation, RLS and hyperlipidemia    HISTORY OF PRESENT ILLNESS:  This is a 82 year old male who has been admitted to [facility] on 03/08/15 from [facility]. He has PMH of CAD, CHF, chronic atrial fibrillation on Coumadin therapy, AV block S/P pacemaker, AS, AAA S/P repair and hypertension. He had sudden onset of intractable 10/10 is spasmodic low back pain. He was given IV Rocephin. A CT scan of  ABD/pelvis was negative for acute findings. MRI could not be performed due to his pacemaker. He was also given IV vancomycin, IV Decadron and IV Robaxin with pain medications. He has been having up to 103 temp in the ED. Blood culture showed Streptococcus viridans and ID was consulted. He underwent TEE which showed endocarditis. Right inguinal area was very tender and surgery evaluated with out need for acute  interventions. He will continue Rocephin 8 weeks per ID recommendations.  He has been admitted for a short-term rehabilitation.  PAST MEDICAL HISTORY:  Past Medical History  Diagnosis Date  . Hypertension   . Hyperlipidemia   . CAD (coronary artery disease)     a. 05/2004 CABGx4 (LIMA->LAD, VG->RI, VG->OM, VG->RCA);  b. 08/2012 Cath: Native 3VD with 3/4 patent grafts (VG->RI occluded).  . Atrial fibrillation (HCC)   . Macular degeneration   . Aortic stenosis     a. 08/2012 TEE: EF 45-55%, critical AS, Triv AI, mild to mod MR, Sev TR;  b. 08/2012 AVR (21mm Magna Ease pericardial tissue valve) & TV Repair (30mm MC3 annuloplasty ring).  Marland Kitchen AAA (abdominal aortic aneurysm) (HCC)     a. 06/2010 s/p Endovascular AAA Repair  . 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 Pain     PT HAS A LOT OF LOWER BACK PAIN- PREVIOUS BACK SURGERY AND DJD  . Pacemaker   . Shortness of breath     MOSTLY WITH EXERTION - IMPROVED SINCE HEART VALVE REPLACEMENT  . Arthritis     LEFT KNEE OA AND PAIN;  DJD LUMBAR  . Anemia requiring transfusions     MOST RECENT WAS JUNE 2014 WITH HEART SURGERY  . Complication of anesthesia     STATES HE DOES WELL WITH DIPROVAN  . Blood transfusion without reported diagnosis   . Cataract   . CHF (congestive heart  failure) (HCC)      CURRENT MEDICATIONS: Reviewed  [name] Medications  New Prescriptions   No medications on file  Previous Medications   CEFTRIAXONE 2 G IN DEXTROSE 5 % 50 ML    Inject 2 g into the vein daily. Patient to continue IV Rocephin total of 8 weeks with stop date 04/23/2015   FOLIC ACID (FOLVITE) 1 MG TABLET    Take 1 mg by mouth daily.    GABAPENTIN (NEURONTIN) 300 MG CAPSULE    Take 300 mg by mouth 2 (two) times daily.   HYDROCODONE-ACETAMINOPHEN (NORCO) 10-325 MG TABLET    Take 1 tablet by mouth every 4 (four) hours as needed for moderate pain.   HYDROCODONE-ACETAMINOPHEN (NORCO) 10-325 MG TABLET     Take 1.5 tablets by mouth every 4 (four) hours as needed for severe pain.   HYDROCODONE-ACETAMINOPHEN (NORCO) 10-325 MG TABLET    Take 1 tablet by mouth at bedtime.   OMEPRAZOLE (PRILOSEC) 20 MG CAPSULE    Take 20 mg by mouth daily.   PROTEIN (PROCEL PO)    Take 1 scoop by mouth 2 (two) times daily.   ROPINIROLE (REQUIP) 1 MG TABLET    Take 1 mg by mouth daily.   SENNA (SENOKOT) 8.6 MG TABS TABLET    Take 2 tablets (17.2 mg total) by mouth at bedtime.   SIMVASTATIN (ZOCOR) 20 MG TABLET    Take 20 mg by mouth at bedtime.           ACETAMINOPHEN (TYLENOL) 325 MG TABLET    Take 2 tablets (650 mg total) by mouth every 6 (six) hours as needed for mild pain (or Fever >/= 101).   ACIDOPHILUS (RISAQUAD) CAPS CAPSULE    Take 2 capsules by mouth daily.   FUROSEMIDE (LASIX) 20 MG TABLET    Take 20 mg by mouth daily as needed (swelling). Reported on 03/23/2015   METHOCARBAMOL (ROBAXIN) 500 MG TABLET    Take 1 tablet (500 mg total) by mouth every 6 (six) hours as needed for muscle spasms.   MULTIPLE VITAMINS-MINERALS (PRESERVISION AREDS 2) CAPS    Take 1 tablet by mouth 2 (two) times daily. Reported on 03/23/2015   ONDANSETRON (ZOFRAN) 4 MG TABLET    Take 1 tablet (4 mg total) by mouth every 6 (six) hours as needed for nausea.   OXYCODONE (OXY IR/ROXICODONE) 5 MG IMMEDIATE RELEASE TABLET    Take 1 tablet (5 mg total) by mouth every 6 (six) hours as needed for breakthrough pain (Please give prior to physical therapy).   PREDNISONE (DELTASONE) 5 MG TABLET    Take 1 tablet (5 mg total) by mouth daily with breakfast.   TRAMADOL (ULTRAM) 50 MG TABLET    Take 0.5 tablets (25 mg total) by mouth daily.       WARFARIN (COUMADIN) 6 MG TABLET    Take 1 tablet (6 mg total) by mouth daily at 6 PM. Starting 12/29    Allergies  Allergen Reactions  . Tape Other (See Comments)    Pulls skin off  . Vancomycin Other (See Comments)    Hearing loss     REVIEW OF SYSTEMS:  GENERAL: no change in appetite, no fatigue, no  weight changes, no fever, chills or weakness EYES: Denies change in vision, dry eyes, eye pain, itching or discharge EARS: Denies change in hearing, ringing in ears, or earache NOSE: Denies nasal congestion or epistaxis MOUTH and THROAT: Denies oral discomfort, gingival pain or bleeding, pain from teeth  or hoarseness   RESPIRATORY: no cough, SOB, DOE, wheezing, hemoptysis CARDIAC: no chest pain, edema or palpitations GI: no abdominal pain, diarrhea, constipation, heart burn, nausea or vomiting GU: Denies dysuria, frequency, hematuria, incontinence, or discharge PSYCHIATRIC: Denies feeling of depression or anxiety. No report of hallucinations, insomnia, paranoia, or agitation   PHYSICAL EXAMINATION  GENERAL APPEARANCE: Well nourished. In no acute distress. Normal body habitus SKIN:  + PICC line on right upper arm HEAD: Normal in size and contour. No evidence of trauma EYES: Lids open and close normally. No blepharitis, entropion or ectropion. PERRL. Conjunctivae are clear and sclerae are white. Lenses are without opacity EARS: Pinnae are normal. Patient hears normal voice tunes of the examiner MOUTH and THROAT: Lips are without lesions. Oral mucosa is moist and without lesions. Tongue is normal in shape, size, and color and without lesions NECK: supple, trachea midline, no neck masses, no thyroid tenderness, no thyromegaly LYMPHATICS: no LAN in the neck, no supraclavicular LAN RESPIRATORY: breathing is even & unlabored, BS CTAB CARDIAC: RRR, no murmur,no extra heart sounds, no edema; has pacemaker GI: abdomen soft, normal BS, no masses, no tenderness, no hepatomegaly, no splenomegaly EXTREMITIES:  Able to move 4 extremities PSYCHIATRIC: Alert and oriented X 3. Affect and behavior are appropriate  LABS/RADIOLOGY: Labs reviewed: Basic Metabolic Panel:  Recent Labs  91/47/8212/21/16 0614 03/03/15 0402 03/05/15 0445  NA 139 139 138  K 4.4 3.5 3.3*  CL 106 104 101  CO2 24 25 28   GLUCOSE  98 99 99  BUN 18 20 18   CREATININE 1.48* 1.51* 1.57*  CALCIUM 9.0 8.7* 8.8*   Liver Function Tests:  Recent Labs  02/25/15 2045  AST 17  ALT 10*  ALKPHOS 86  BILITOT 0.8  PROT 6.6  ALBUMIN 3.4*   CBC:  Recent Labs  02/21/15 1615  02/25/15 2045  03/02/15 0922 03/05/15 0445 03/08/15 0533  WBC 4.2  < > 5.4  < > 6.5 5.4 5.0  NEUTROABS 2.9  --  4.3  --   --   --   --   HGB 9.9*  < > 9.3*  < > 10.0* 9.1* 9.1*  HCT 31.5*  < > 29.3*  < > 31.4* 29.0* 30.1*  MCV 92.1  < > 91.8  < > 92.1 92.1 92.0  PLT 99*  --  102*  < > 186 205 196  < > = values in this interval not displayed.  CBG:  Recent Labs  03/06/15 2124 03/07/15 0619  GLUCAP 92 93      Dg Orthopantogram  02/28/2015  CLINICAL DATA:  Jaw pain EXAM: ORTHOPANTOGRAM/PANORAMIC COMPARISON:  None. FINDINGS: The patient is predominantly edentulous. On the left adjacent to the root of the second mandibular bicuspid there is a circular lucency identified suggestive of periapical abscess. IMPRESSION: Likely periapical abscess on the left in the mandible adjacent to the root of the second bicuspid. Electronically Signed   By: Alcide CleverMark  Lukens M.D.   On: 02/28/2015 14:13   Dg Chest 1 View  02/25/2015  CLINICAL DATA:  82 year old male with back pain for 6 hours. EXAM: CHEST 1 VIEW COMPARISON:  Chest radiograph 01/20/2013. Included lung bases from CT abdomen/pelvis performed concurrently. FINDINGS: Single lead left-sided pacemaker remains in place. Patient is post median sternotomy with 2 prosthetic valves. Multi chamber cardiomegaly, progressed from prior exam. There is tortuosity and atherosclerosis of the thoracic aorta. Vascular congestion with possible mild perihilar edema. Calcified granuloma in the right upper lung. Small pleural effusions on  CT are not seen radiographically. No confluent airspace disease. No pneumothorax. IMPRESSION: Cardiomegaly with vascular congestion and questionable perihilar edema. Recommend correlation for  CHF. Electronically Signed   By: Rubye Oaks M.D.   On: 02/25/2015 22:30   Dg Chest 2 View  03/04/2015  CLINICAL DATA:  Fever 3 days. EXAM: CHEST  2 VIEW COMPARISON:  Radiograph 03/03/2015 FINDINGS: Left-sided pacemaker single lead overlies enlarged heart cardiac silhouette. RIGHT PICC line unchanged. Exam is lordotic. No effusion, infiltrate, pneumothorax. Flowing osteophytosis of thoracic spine noted. IMPRESSION: Cardiomegaly without acute cardiopulmonary findings. Electronically Signed   By: Genevive Bi M.D.   On: 03/04/2015 13:00   Dg Thoracic Spine 2 View  02/25/2015  CLINICAL DATA:  Lower back pain since 4:30 this afternoon. History of chronic back pain. No injury noted. EXAM: THORACIC SPINE 2 VIEWS COMPARISON:  Chest x-ray dated 01/20/2013. FINDINGS: Portions of the thoracic spine are incompletely seen due to overlying osseous and soft tissue structures. Overall osseous alignment appears normal and grossly stable compared to a previous chest x-ray of 01/20/2013. No osseous fracture or dislocation seen. No acute - appearing cortical irregularity or osseous lesion. Paravertebral soft tissues are unremarkable for acute process. IMPRESSION: No acute findings. Electronically Signed   By: Bary Richard M.D.   On: 02/25/2015 22:29   Dg Lumbar Spine Complete  02/25/2015  CLINICAL DATA:  82 year old male with lumbosacral back pain for 6 hours. EXAM: LUMBAR SPINE - COMPLETE 4+ VIEW COMPARISON:  Reformats from CT abdomen/ pelvis 09/02/2011. FINDINGS: Kyphoplasty within L4 compression deformity. Remaining vertebral body heights are maintained. Mild levoscoliosis centered at L2-L3 with associated degenerative disc disease. Additional disc space narrowing at L1-L2. The bones are under mineralized. Aorto bi-iliac stent in place. IMPRESSION: 1. Kyphoplasty within L4 compression deformity. 2. Scoliosis and degenerative change.  No acute bony abnormality. Electronically Signed   By: Rubye Oaks M.D.    On: 02/25/2015 22:26   Ct Lumbar Spine Wo Contrast  02/28/2015  CLINICAL DATA:  Several day history of low back pain. EXAM: CT LUMBAR SPINE WITHOUT CONTRAST TECHNIQUE: Multidetector CT imaging of the lumbar spine was performed without intravenous contrast administration. Multiplanar CT image reconstructions were also generated. COMPARISON:  CT scan 02/25/2015 FINDINGS: Remote vertebral augmentation changes at L4 a with a significant compression deformity. Mild stable retropulsion and mild canal encroachment. New line the other lumbar vertebral bodies are maintained. No acute fractures identified. S1 is not covered on this examination. Stable advanced degenerative disc disease at L3-4 along with advanced facet disease at L3-4 and L4-5. There are moderate-sized bilateral pleural effusions noted along with overlying basilar atelectasis. Stable aortoiliac stent graft. No complicating features are demonstrated. L1-2: Broad-based bulging annulus and osteophytic ridging with mild left lateral recess and left foraminal encroachment. This could irritate the left L1 nerve root. L2-3: Advanced degenerative disc disease and facet disease with a bulging annulus and osteophytic ridging. Mild flattening of the ventral thecal sac but no significant spinal or foraminal stenosis. L3-4: Wide decompressive laminectomy and vertebral augmentation changes. No significant spinal stenosis. Right-sided disc osteophyte complex with significant right foraminal stenosis likely effecting the right L3 nerve root. L4-5: Bulging annulus, osteophytic ridging and facet disease with mild spinal and bilateral lateral recess stenosis. No significant foraminal stenosis. L5-S1:  Bulging annulus but no spinal or foraminal stenosis. IMPRESSION: 1. Remote L4 compression fracture with vertebral augmentation changes. 2. No new/acute fractures are identified. 3. Mild left lateral recess and left foraminal stenosis at L1-2. 4. Right foraminal/extra  foraminal disc osteophyte complex on the right at L3-4 likely effecting the right L3 nerve root. 5. Mild spinal and bilateral lateral recess stenosis at L4-5. 6. Bilateral moderate-sized pleural effusions. Electronically Signed   By: Rudie Meyer M.D.   On: 02/28/2015 18:40   Ct Lumbar Spine W Contrast  03/06/2015  CLINICAL DATA:  Severe right-sided low back pain extending into the right leg. Recent fevers with blood cultures positive for Streptococcus. Evaluate for discitis. History of back surgery in 2011. EXAM: CT LUMBAR SPINE WITH CONTRAST TECHNIQUE: Multidetector CT imaging of the lumbar spine was performed with intravenous contrast administration. Multiplanar CT image reconstructions were also generated. CONTRAST:  75mL OMNIPAQUE IOHEXOL 300 MG/ML  SOLN COMPARISON:  Lumbar spine CT 02/28/2015.  Abdominal CT 02/25/2015. FINDINGS: The superior endplate compression fracture at L4 appears unchanged status post spinal augmentation. There is mild osseous retropulsion. The overall alignment is stable with a convex left scoliosis. There is no evidence of acute fracture, traumatic subluxation or bone destruction. Multilevel degenerative disc disease with vacuum phenomenon and endplate degeneration is unchanged. The individual disc space levels appear unchanged. There is significant right foraminal narrowing at L3-4 with probable right L3 nerve root encroachment. At L4-5, there is mild multifactorial spinal stenosis with left foraminal narrowing due to asymmetric facet hypertrophy. No paraspinal inflammatory changes or fluid collections are identified. There is diffuse atherosclerosis with dilatation of the distal aorta status post aortoiliac stent grafting. The aorta measures up to 5.8 cm in diameter. Left renal cyst is grossly unchanged. IMPRESSION: 1. No acute findings or significant changes from recent CT of 6 days prior. 2. No evidence of osteomyelitis, discitis or paraspinal inflammation. 3. Stable L4  compression fracture status post spinal augmentation. 4. Stable multilevel spondylosis with significant foraminal narrowing on the right at L3-4 and on the left at L4-5 Electronically Signed   By: Carey Bullocks M.D.   On: 03/06/2015 10:53   Ct Abdomen Pelvis W Contrast  02/25/2015  CLINICAL DATA:  Severe low back pain with any movement. History of chronic back pain. Abdominal pain and tenderness. EXAM: CT ABDOMEN AND PELVIS WITH CONTRAST TECHNIQUE: Multidetector CT imaging of the abdomen and pelvis was performed using the standard protocol following bolus administration of intravenous contrast. CONTRAST:  70mL OMNIPAQUE IOHEXOL 300 MG/ML  SOLN COMPARISON:  09/02/2011 FINDINGS: Small bilateral pleural effusions. Multiple surgical clips at the EG junction. Postoperative changes in the mediastinum. Surgical absence of the gallbladder. Calcified granulomas scattered throughout the liver and spleen. No hepatosplenomegaly. The pancreas, adrenal glands, inferior vena cava, and retroperitoneal lymph nodes are unremarkable. Abdominal aortic aneurysm post aortoiliac stent grafts. Native sac diameter is 5.6 cm. This measurement is similar to the previous study. Kidneys are atrophic bilaterally. Renal nephrograms appear symmetrical. No hydronephrosis. Large cysts in both kidneys, largest is off of the lower pole of the left kidney and measures about 8.9 cm diameter. This cyst is increased in size since the previous study. The stomach, small bowel, and colon are not abnormally distended. The colon is diffusely stool-filled. No free air or free fluid in the abdomen. Surgical clips in the anterior abdominal wall. Pelvis: Prostate gland is not enlarged. Prostate defect may indicate previous TUR procedure. Bladder wall is not thickened. Appendix is surgically absent. No free or loculated pelvic fluid collections. No pelvic mass or lymphadenopathy. Vascular calcifications in the pelvis. Fat in the inguinal canals. Degenerative  changes in the lumbar spine. L4 compression deformity post kyphoplasty. No destructive bone lesions. IMPRESSION: No significant  acute process demonstrated in the abdomen or pelvis. There are small bilateral pleural effusions. Postoperative changes demonstrated throughout the chest, abdomen, and pelvis. Abdominal aortic aneurysm post stent graft. Native sac diameter is 5.6 cm without change since prior study. Multiple renal cysts with enlarging large cyst on the left kidney. Electronically Signed   By: Burman Nieves M.D.   On: 02/25/2015 22:42   Dg Chest Port 1 View  03/03/2015  CLINICAL DATA:  Bedside PICC placement. EXAM: PORTABLE CHEST 1 VIEW COMPARISON:  02/25/2015 and earlier. FINDINGS: Right arm PICC tip projects over the lower SVC near the cavoatrial junction. Prior sternotomy for CABG, aortic valve replacement and mitral valve replacement or annuloplasty. Stable moderate to marked cardiomegaly. 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 Calcified granuloma in the right upper lobe. Lungs otherwise clear. No pneumothorax. No visible pleural effusions. 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 IMPRESSION: 1. Right arm PICC tip projects over the lower SVC near the cavoatrial junction. 2. Stable moderate to marked cardiomegaly. No acute cardiopulmonary disease. Electronically Signed   By: Hulan Saas M.D.   On: 03/03/2015 09:33   Ct Maxillofacial Ltd Wo Cm  02/28/2015  CLINICAL DATA:  82 year old male with sinusitis and bacteremia. EXAM: CT PARANASAL SINUS LIMITED WITHOUT CONTRAST TECHNIQUE: Non-contiguous multidetector CT images of the paranasal sinuses were obtained in a single plane without contrast. COMPARISON:  None. FINDINGS: There is no acute fracture of the visualized facial bones. Bilateral cataract surgeries noted. The globes are otherwise intact. The retro-orbital fat and orbital walls are preserved. The paranasal sinuses are well aerated. No significant  mucoperiosteal thickening. No air-fluid level. No soft tissue swelling noted. No acute hemorrhage identified within the visualized brain. IMPRESSION: Unremarkable CT of the paranasal sinuses. Electronically Signed   By: Elgie Collard M.D.   On: 02/28/2015 18:31    ASSESSMENT/PLAN:  Physical deconditioning - for rehabilitation  Streptococcus viridans bacteremia/endocarditis - continue Rocephin 2 g IV daily 8 weeks; follow-up with Dr. Morton Peters, infectious disease, in 4 weeks  Lumbar discitis - CT lumbar spine with no evidence of discitis, but high clinical suspicion; continue IV Rocephin 8 weeks, prednisone 5 mg daily, Neurontin 300 mg 1 capsule twice a day, Robaxin 500 mg 1 tab by mouth every 6 hours when necessary, oxycodone 5 mg IR 1 tab by mouth every 6 hours when necessary and tramadol 50 mg 1/2 tab = 25 mg daily  Anemia of chronic disease - hemoglobin 9.1; stable; continue folic acid 1 mg daily  Chronic diastolic heart failure - continue Lasix 20 mg daily  Chronic kidney disease, stage III - creatinine 1.57; check BMP  Chronic atrial fibrillation - rate controlled; continue Coumadin  Long-term use of anticoagulant - INR 2.2; continue Coumadin 6 mg daily and check INR on 03/14/15  RLS - continue Requip 1 mg daily  Hyperlipidemia - continue simvastatin 20 mg 1 tab by mouth daily at bedtime     Goals of care:  Short-term rehabilitation    [facility], NP [facility] [phone]

## 2015-03-24 NOTE — Addendum Note (Signed)
Addended by: Karin LieuBAILEY, Preslee Regas T on: 03/24/2015 02:18 PM   Modules accepted: Medications

## 2015-04-06 ENCOUNTER — Telehealth: Payer: Self-pay | Admitting: *Deleted

## 2015-04-06 NOTE — Telephone Encounter (Signed)
Patient was discharged from SNF back to his home in [place], [place].  [name] daughter [name] asked if the 1/30 appointment could be moved.  Per Dr. Daiva Eves, as long as the patient has home health and medical oversight set up, it is ok for patient to follow original antibiotic plan (including pull PICC at end of therapy), then see Dr Daiva Eves 1-2 months later for follow up.  Patient has home health set up, is being monitored by Dr. Meryle Ready at [phone].  Patient set to complete IV antibiotics 2/8. Patient tentatively scheduled for 3/16 (patient's daughter will be in [place] that day as well). Please advise.

## 2015-04-10 ENCOUNTER — Inpatient Hospital Stay: Payer: Medicare PPO | Admitting: Infectious Disease

## 2015-04-19 ENCOUNTER — Ambulatory Visit: Payer: Medicare PPO | Admitting: Cardiothoracic Surgery

## 2015-04-27 ENCOUNTER — Encounter: Payer: Self-pay | Admitting: Infectious Disease

## 2015-05-16 ENCOUNTER — Encounter: Payer: Medicare PPO | Admitting: *Deleted

## 2015-05-17 ENCOUNTER — Encounter: Payer: Self-pay | Admitting: Cardiothoracic Surgery

## 2015-05-17 ENCOUNTER — Ambulatory Visit (INDEPENDENT_AMBULATORY_CARE_PROVIDER_SITE_OTHER): Payer: Medicare PPO | Admitting: Cardiothoracic Surgery

## 2015-05-17 VITALS — BP 110/68 | HR 76 | Resp 20 | Ht 70.0 in | Wt 158.0 lb

## 2015-05-17 DIAGNOSIS — Z952 Presence of prosthetic heart valve: Secondary | ICD-10-CM

## 2015-05-17 DIAGNOSIS — I38 Endocarditis, valve unspecified: Secondary | ICD-10-CM

## 2015-05-17 DIAGNOSIS — Z954 Presence of other heart-valve replacement: Secondary | ICD-10-CM

## 2015-05-17 NOTE — Progress Notes (Signed)
PCP is PROVIDER NOT IN SYSTEM Referring Provider is Lyn Records, MD  Chief Complaint  Patient presents with  . Follow-up    f/u from hospital consult from 02/2015, HX of AVR    HPI: The patient returns for followup after recent hospitalization for streptococcal-strep viridans bacteremia December 2016. At that time he had high Fever and severe back pain and probably had discitis. Echocardiogram showed his bioprosthetic aortic valve to be stable without perivalvular leak. His tricuspid ring was intact without TR. There was a small mobile structure associated with the aortic valve which could have been a suture but he was treated for 8 weeks of IV Rocephin for possible endocarditis. With antibiotics his back pain and fever resolved. It has  been 3 weeks since his last IV dose of antibiotic and he has  had no fever or recurrent symptoms. He returns for removal of his right arm PICC line.  The patient's mouth was carefully examined by the dental surgeon and was not felt to have a root abscess. The patient's family states he had a small mucosal ulceration under his upper maxillary dental plate from a poor fit and this preceded his bacteremia and could have been a source. The upper dental plate has been refitted. The ulcer is healed.  Past Medical History  Diagnosis Date  . Hypertension   . Hyperlipidemia   . CAD (coronary artery disease)     a. 05/2004 CABGx4 (LIMA->LAD, VG->RI, VG->OM, VG->RCA);  b. 08/2012 Cath: Native 3VD with 3/4 patent grafts (VG->RI occluded).  . Atrial fibrillation (HCC)   . Macular degeneration   . Aortic stenosis     a. 08/2012 TEE: EF 45-55%, critical AS, Triv AI, mild to mod MR, Sev TR;  b. 08/2012 AVR (21mm Magna Ease pericardial tissue valve) & TV Repair (30mm MC3 annuloplasty ring).  Marland Kitchen AAA (abdominal aortic aneurysm) (HCC)     a. 06/2010 s/p Endovascular AAA Repair  . 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 Pain      PT HAS A LOT OF LOWER BACK PAIN- PREVIOUS BACK SURGERY AND DJD  . Pacemaker   . Shortness of breath     MOSTLY WITH EXERTION - IMPROVED SINCE HEART VALVE REPLACEMENT  . Arthritis     LEFT KNEE OA AND PAIN;  DJD LUMBAR  . Anemia requiring transfusions     MOST RECENT WAS JUNE 2014 WITH HEART SURGERY  . Complication of anesthesia     STATES HE DOES WELL WITH DIPROVAN  . Blood transfusion without reported diagnosis   . Cataract   . CHF (congestive heart failure) [facility])     Past Surgical History  Procedure Laterality Date  . Endovascular stent insertion  07/05/2010  . Appendectomy    . Coronary artery bypass graft    . Vascular surgery    . Back surgery    . Cardiac catheterization    . Esophagogastroduodenoscopy  06/19/2011    Procedure: ESOPHAGOGASTRODUODENOSCOPY (EGD);  Surgeon: Graylin Shiver, MD;  Location: [facility] OR;  Service: Gastroenterology;  Laterality: N/A;  . Colonoscopy  06/19/2011    Procedure: COLONOSCOPY;  Surgeon: Graylin Shiver, MD;  Location: [facility] OR;  Service: Gastroenterology;  Laterality: N/A;  . Eye surgery      bilater cataracts removed  . Aortic valve replacement N/A 09/01/2012    Procedure: REDO AORTIC VALVE REPLACEMENT (AVR);  Surgeon: Kerin Perna, MD;  Location[facility] OR;  Service: Open Heart Surgery;  Laterality: N/A;  . Tricuspid valve replacement N/A 09/01/2012    Procedure: TRICUSPID VALVE REPAIR;  Surgeon: Kerin PernaPeter Van Trigt, MD;  [facility];  Service: Open Heart Surgery;  Laterality: N/A;  . Intraoperative transesophageal echocardiogram N/A 09/01/2012    Procedure: INTRAOPERATIVE TRANSESOPHAGEAL ECHOCARDIOGRAM;  Surgeon: Kerin PernaPeter Van Trigt, MD;  [facility];  Service: Open Heart Surgery;  Laterality: N/A;  . Pacemaker insertion  09/07/2012    Medtronic Sensia single chamber pacemaker implanted by Dr Ladona Ridgelaylor   . Surgery about 50 yrs ago for perforated ulcer and repair of hiatal hernia    . Total knee arthroplasty Left 02/15/2013    Procedure: LEFT TOTAL KNEE  ARTHROPLASTY;  Surgeon: Shelda PalMatthew D Olin, MD;  Location: [facility] ORS;  Service: Orthopedics;  Laterality: Left;  . Permanent pacemaker insertion N/A 09/07/2012    Procedure: PERMANENT PACEMAKER INSERTION;  Surgeon: Marinus MawGregg W Taylor, MD;  [facility];  Service: Cardiovascular;  Laterality: N/A;  . Hernia repair    . Joint replacement    . Spine surgery    . Cardiac valve replacement    . Prostate surgery    . Tee without cardioversion N/A 02/27/2015    Procedure: TRANSESOPHAGEAL ECHOCARDIOGRAM (TEE);  Surgeon: Chilton Siiffany Antioch, MD;  [facility];  Service: Cardiovascular;  Laterality: N/A;    Family History  Problem Relation Age of Onset  . Coronary artery disease Brother   . Healthy Mother   . Emphysema Father   . Healthy Sister   . Alzheimer's disease Brother     Social History Social History  Substance Use Topics  . Smoking status: Former Smoker    Quit date: 10/29/1975  . Smokeless tobacco: Never Used  . Alcohol Use: No    Current Outpatient Prescriptions  Medication Sig Dispense Refill  . acetaminophen (TYLENOL) 325 MG tablet Take 650 mg by mouth. Pt. receives Norco PRN.  Do not exceed 4000 mg of Tylenol in 24 hours    . folic acid (FOLVITE) 1 MG tablet Take 1 mg by mouth daily.     . furosemide (LASIX) 20 MG tablet Take 20 mg by mouth daily. Prn with feet swelling    . gabapentin (NEURONTIN) 300 MG capsule Take 300 mg by mouth 2 (two) times daily.    Marland Kitchen. HYDROcodone-acetaminophen (NORCO) 10-325 MG tablet Take 1 tablet by mouth every 4 (four) hours as needed for moderate pain.    Marland Kitchen. HYDROcodone-acetaminophen (NORCO) 10-325 MG tablet Take 1.5 tablets by mouth every 4 (four) hours as needed for severe pain.    Marland Kitchen. HYDROcodone-acetaminophen (NORCO) 10-325 MG tablet Take 1 tablet by mouth at bedtime.    . methocarbamol (ROBAXIN) 500 MG tablet Take 500 mg by mouth every 6 (six) hours as needed for muscle spasms.    . Multiple Vitamins-Minerals (PRESERVISION AREDS 2 PO)  Take 1 capsule by mouth 2 (two) times daily.    Marland Kitchen. omeprazole (PRILOSEC) 20 MG capsule Take 20 mg by mouth daily.    . predniSONE (DELTASONE) 5 MG tablet Take 5 mg by mouth daily with breakfast.    . Protein (PROCEL PO) Take 1 scoop by mouth 2 (two) times daily.    Marland Kitchen. rOPINIRole (REQUIP) 1 MG tablet Take 1 mg by mouth daily.    . simvastatin (ZOCOR) 20 MG tablet Take 20 mg by mouth at bedtime.     . traMADol (ULTRAM) 50 MG tablet Take 25 mg by mouth daily.    Marland Kitchen. warfarin (  COUMADIN) 5 MG tablet Take 5 mg by mouth daily.     No current facility-administered medications for this visit.    Allergies  Allergen Reactions  . Tape Other (See Comments)    Pulls skin off  . 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 Fib Right arm PICC line clean and dry and is fully removed and a pressure dressing placed No peripheral edema  Diagnostic Tests: none  Impression: Successful completion of 8 weeks of IV Rocephin for streptococcal bacteremia probably from an oral source-strep viridans  PICC line has been removed Do not feel repeat echocardiogram will be necessary-doubt there was true endocarditis  Plan    return as needed   Mikey Bussing, MD [facility] [phone]

## 2015-05-19 ENCOUNTER — Encounter: Payer: Self-pay | Admitting: Cardiology

## 2015-05-25 ENCOUNTER — Ambulatory Visit (INDEPENDENT_AMBULATORY_CARE_PROVIDER_SITE_OTHER): Payer: Medicare PPO | Admitting: *Deleted

## 2015-05-25 ENCOUNTER — Ambulatory Visit: Payer: Medicare PPO | Admitting: Infectious Disease

## 2015-05-25 DIAGNOSIS — I442 Atrioventricular block, complete: Secondary | ICD-10-CM | POA: Diagnosis not present

## 2015-05-25 DIAGNOSIS — I482 Chronic atrial fibrillation, unspecified: Secondary | ICD-10-CM

## 2015-05-25 DIAGNOSIS — Z950 Presence of cardiac pacemaker: Secondary | ICD-10-CM | POA: Diagnosis not present

## 2015-06-02 NOTE — Progress Notes (Signed)
Remote pacemaker transmission.   

## 2015-06-15 LAB — CUP PACEART REMOTE DEVICE CHECK
Battery Impedance: 223 Ohm
Battery Remaining Longevity: 106 mo
Battery Voltage: 2.78 V
Brady Statistic RV Percent Paced: 73 %
Date Time Interrogation Session: 20170316154334
Implantable Lead Implant Date: 20140630
Implantable Lead Location: 753860
Implantable Lead Model: 5076
Lead Channel Impedance Value: 439 Ohm
Lead Channel Impedance Value: 67 Ohm
Lead Channel Pacing Threshold Amplitude: 0.375 V
Lead Channel Pacing Threshold Pulse Width: 0.4 ms
Lead Channel Sensing Intrinsic Amplitude: 16 mV
Lead Channel Setting Pacing Amplitude: 2.5 V
Lead Channel Setting Pacing Pulse Width: 0.4 ms
Lead Channel Setting Sensing Sensitivity: 5.6 mV

## 2015-06-20 ENCOUNTER — Encounter: Payer: Self-pay | Admitting: Cardiology

## 2015-09-05 ENCOUNTER — Telehealth: Payer: Self-pay | Admitting: Cardiology

## 2015-09-05 ENCOUNTER — Ambulatory Visit (INDEPENDENT_AMBULATORY_CARE_PROVIDER_SITE_OTHER): Payer: Medicare PPO | Admitting: *Deleted

## 2015-09-05 DIAGNOSIS — I442 Atrioventricular block, complete: Secondary | ICD-10-CM | POA: Diagnosis not present

## 2015-09-05 NOTE — Telephone Encounter (Signed)
Confirmed remote transmission w/ pt daughter.   

## 2015-09-05 NOTE — Progress Notes (Signed)
Remote pacemaker transmission.   

## 2015-09-06 ENCOUNTER — Encounter: Payer: Self-pay | Admitting: Cardiology

## 2015-09-06 LAB — CUP PACEART REMOTE DEVICE CHECK
Battery Impedance: 223 Ohm
Battery Remaining Longevity: 107 mo
Battery Voltage: 2.78 V
Brady Statistic RV Percent Paced: 70 %
Implantable Lead Implant Date: 20140630
Implantable Lead Location: 753860
Lead Channel Setting Pacing Amplitude: 2.5 V
Lead Channel Setting Pacing Pulse Width: 0.4 ms
MDC IDC MSMT LEADCHNL RA IMPEDANCE VALUE: 67 Ohm
MDC IDC MSMT LEADCHNL RV IMPEDANCE VALUE: 439 Ohm
MDC IDC MSMT LEADCHNL RV PACING THRESHOLD AMPLITUDE: 0.5 V
MDC IDC MSMT LEADCHNL RV PACING THRESHOLD PULSEWIDTH: 0.4 ms
MDC IDC MSMT LEADCHNL RV SENSING INTR AMPL: 16 mV
MDC IDC SESS DTM: 20170627170706
MDC IDC SET LEADCHNL RV SENSING SENSITIVITY: 5.6 mV

## 2015-12-06 ENCOUNTER — Ambulatory Visit (INDEPENDENT_AMBULATORY_CARE_PROVIDER_SITE_OTHER): Payer: Medicare PPO | Admitting: Internal Medicine

## 2015-12-06 ENCOUNTER — Encounter: Payer: Self-pay | Admitting: Internal Medicine

## 2015-12-06 ENCOUNTER — Encounter: Payer: Self-pay | Admitting: Interventional Cardiology

## 2015-12-06 ENCOUNTER — Ambulatory Visit (INDEPENDENT_AMBULATORY_CARE_PROVIDER_SITE_OTHER): Payer: Medicare PPO | Admitting: Interventional Cardiology

## 2015-12-06 ENCOUNTER — Encounter (INDEPENDENT_AMBULATORY_CARE_PROVIDER_SITE_OTHER): Payer: Self-pay

## 2015-12-06 VITALS — BP 114/66 | HR 82 | Ht 70.0 in | Wt 154.0 lb

## 2015-12-06 DIAGNOSIS — I2510 Atherosclerotic heart disease of native coronary artery without angina pectoris: Secondary | ICD-10-CM

## 2015-12-06 DIAGNOSIS — I5032 Chronic diastolic (congestive) heart failure: Secondary | ICD-10-CM

## 2015-12-06 DIAGNOSIS — I10 Essential (primary) hypertension: Secondary | ICD-10-CM | POA: Diagnosis not present

## 2015-12-06 DIAGNOSIS — I482 Chronic atrial fibrillation, unspecified: Secondary | ICD-10-CM

## 2015-12-06 DIAGNOSIS — T826XXD Infection and inflammatory reaction due to cardiac valve prosthesis, subsequent encounter: Secondary | ICD-10-CM | POA: Diagnosis not present

## 2015-12-06 DIAGNOSIS — I442 Atrioventricular block, complete: Secondary | ICD-10-CM | POA: Diagnosis not present

## 2015-12-06 DIAGNOSIS — I38 Endocarditis, valve unspecified: Secondary | ICD-10-CM

## 2015-12-06 DIAGNOSIS — I714 Abdominal aortic aneurysm, without rupture, unspecified: Secondary | ICD-10-CM

## 2015-12-06 LAB — CUP PACEART INCLINIC DEVICE CHECK
Date Time Interrogation Session: 20170927121419
Implantable Lead Implant Date: 20140630
Implantable Lead Location: 753860
Implantable Lead Model: 5076
Lead Channel Pacing Threshold Amplitude: 0.5 V
Lead Channel Sensing Intrinsic Amplitude: 22.4 mV
MDC IDC MSMT BATTERY IMPEDANCE: 247 Ohm
MDC IDC MSMT BATTERY REMAINING LONGEVITY: 104 mo
MDC IDC MSMT BATTERY VOLTAGE: 2.78 V
MDC IDC MSMT LEADCHNL RA IMPEDANCE VALUE: 67 Ohm
MDC IDC MSMT LEADCHNL RV IMPEDANCE VALUE: 423 Ohm
MDC IDC MSMT LEADCHNL RV PACING THRESHOLD PULSEWIDTH: 0.4 ms
MDC IDC SET LEADCHNL RV PACING AMPLITUDE: 2.5 V
MDC IDC SET LEADCHNL RV PACING PULSEWIDTH: 0.4 ms
MDC IDC SET LEADCHNL RV SENSING SENSITIVITY: 5.6 mV
MDC IDC STAT BRADY RV PERCENT PACED: 67 %

## 2015-12-06 NOTE — Progress Notes (Signed)
HPI [name] returns today for followup. He is a very pleasant 82 year old man with a history of aortic stenosis status post aortic valve replacement carried out approximately 3 years ago, which was complicated by the development of complete heart block for which the patient underwent insertion of a permanent pacemaker. In the interim he has been stable. He has class II heart failure symptoms. He denies chest pain or syncope. The patient also has chronic atrial fibrillation with a controlled ventricular response. 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 He notes rare peripheral edema. Allergies  Allergen Reactions  . Tape Other (See Comments)    Pulls skin off  . Vancomycin Other (See Comments)    Hearing loss     Current Outpatient Prescriptions  Medication Sig Dispense Refill  . acetaminophen (TYLENOL) 325 MG tablet Take 650 mg by mouth. Pt. receives Norco PRN.  Do not exceed 4000 mg of Tylenol in 24 hours    . folic acid (FOLVITE) 1 MG tablet Take 1 mg by mouth daily.     . furosemide (LASIX) 20 MG tablet Take 20 mg by mouth daily. Prn with feet swelling    . gabapentin (NEURONTIN) 300 MG capsule Take 300 mg by mouth at bedtime.    Marland Kitchen HYDROcodone-acetaminophen (NORCO) 10-325 MG tablet Take 1 tablet by mouth every 4 (four) hours as needed for moderate pain.    Marland Kitchen HYDROcodone-acetaminophen (NORCO) 10-325 MG tablet Take 1 tablet by mouth at bedtime.    . methocarbamol (ROBAXIN) 500 MG tablet Take 500 mg by mouth every 6 (six) hours as needed for muscle spasms.    . Multiple Vitamins-Minerals (PRESERVISION AREDS 2 PO) Take 1 capsule by mouth 2 (two) times daily.    Marland Kitchen omeprazole (PRILOSEC) 20 MG capsule Take 20 mg by mouth daily.    . predniSONE (DELTASONE) 5 MG tablet Take 5 mg by mouth daily with breakfast.    . Protein (PROCEL PO) Take 1 scoop by mouth 2 (two) times daily.    Marland Kitchen rOPINIRole (REQUIP) 1 MG tablet Take 1 mg by mouth daily.    . simvastatin  (ZOCOR) 20 MG tablet Take 20 mg by mouth at bedtime.     . traMADol (ULTRAM) 50 MG tablet Take 25 mg by mouth daily.    Marland Kitchen warfarin (COUMADIN) 5 MG tablet Take 5 mg by mouth daily.     No current facility-administered medications for this visit.      Past Medical History:  Diagnosis Date  . AAA (abdominal aortic aneurysm) (HCC)    a. 06/2010 s/p Endovascular AAA Repair  . Anemia requiring transfusions    MOST RECENT WAS JUNE 2014 WITH HEART SURGERY  . Aortic stenosis    a. 08/2012 TEE: EF 45-55%, critical AS, Triv AI, mild to mod MR, Sev TR;  b. 08/2012 AVR (21mm Magna Ease pericardial tissue valve) & TV Repair (30mm MC3 annuloplasty ring).  . Arthritis    LEFT KNEE OA AND PAIN;  DJD LUMBAR  . Atrial fibrillation (HCC)   . Blood transfusion without reported diagnosis   . CAD (coronary artery disease)    a. 05/2004 CABGx4 (LIMA->LAD, VG->RI, VG->OM, VG->RCA);  b. 08/2012 Cath: Native 3VD with 3/4 patent grafts (VG->RI occluded).  . Cataract   . CHF (congestive heart failure) (HCC)   . 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 Complication of anesthesia    STATES HE DOES  WELL WITH DIPROVAN  . Hyperlipidemia   . Hypertension   . Macular degeneration   . Pacemaker   . Pain    PT HAS A LOT OF LOWER BACK PAIN- PREVIOUS BACK SURGERY AND DJD  . Shortness of breath    MOSTLY WITH EXERTION - IMPROVED SINCE HEART VALVE REPLACEMENT    ROS:   All systems reviewed and negative except as noted in the HPI.   Past Surgical History:  Procedure Laterality Date  . AORTIC VALVE REPLACEMENT N/A 09/01/2012   Procedure: REDO AORTIC VALVE REPLACEMENT (AVR);  Surgeon: Kerin PernaPeter Van Trigt, MD;  [facility];  Service: Open Heart Surgery;  Laterality: N/A;  . APPENDECTOMY    . BACK SURGERY    . CARDIAC CATHETERIZATION    . CARDIAC VALVE REPLACEMENT    . COLONOSCOPY  06/19/2011   Procedure: COLONOSCOPY;  Surgeon: Graylin ShiverSalem F Ganem, MD;  [facility];   Service: Gastroenterology;  Laterality: N/A;  . CORONARY ARTERY BYPASS GRAFT    . ENDOVASCULAR STENT INSERTION  07/05/2010  . ESOPHAGOGASTRODUODENOSCOPY  06/19/2011   Procedure: ESOPHAGOGASTRODUODENOSCOPY (EGD);  Surgeon: Graylin ShiverSalem F Ganem, MD;  [facility];  Service: Gastroenterology;  Laterality: N/A;  . EYE SURGERY     bilater cataracts removed  . HERNIA REPAIR    . INTRAOPERATIVE TRANSESOPHAGEAL ECHOCARDIOGRAM N/A 09/01/2012   Procedure: INTRAOPERATIVE TRANSESOPHAGEAL ECHOCARDIOGRAM;  Surgeon: Kerin PernaPeter Van Trigt, MD;  [facility];  Service: Open Heart Surgery;  Laterality: N/A;  . JOINT REPLACEMENT    . PACEMAKER INSERTION  09/07/2012   Medtronic Sensia single chamber pacemaker implanted by Dr Ladona Ridgelaylor   . PERMANENT PACEMAKER INSERTION N/A 09/07/2012   Procedure: PERMANENT PACEMAKER INSERTION;  Surgeon: Marinus MawGregg W Darielys Giglia, MD;  [facility];  Service: Cardiovascular;  Laterality: N/A;  . PROSTATE SURGERY    . SPINE SURGERY    . SURGERY ABOUT 50 YRS AGO FOR PERFORATED ULCER AND REPAIR OF HIATAL HERNIA    . TEE WITHOUT CARDIOVERSION N/A 02/27/2015   Procedure: TRANSESOPHAGEAL ECHOCARDIOGRAM (TEE);  Surgeon: Chilton Siiffany McKenzie, MD;  [facility];  Service: Cardiovascular;  Laterality: N/A;  . TOTAL KNEE ARTHROPLASTY Left 02/15/2013   Procedure: LEFT TOTAL KNEE ARTHROPLASTY;  Surgeon: Shelda PalMatthew D Olin, MD;  Location: [facility] ORS;  Service: Orthopedics;  Laterality: Left;  . TRICUSPID VALVE REPLACEMENT N/A 09/01/2012   Procedure: TRICUSPID VALVE REPAIR;  Surgeon: Kerin PernaPeter Van Trigt, MD;  [facility];  Service: Open Heart Surgery;  Laterality: N/A;  . VASCULAR SURGERY       Family History  Problem Relation Age of Onset  . Coronary artery disease Brother   . Healthy Mother   . Emphysema Father   . Healthy Sister   . Alzheimer's disease Brother      Social History   Social History  . Marital status: Married    Spouse name: N/A  . Number of children: 2  . Years of education: N/A    Occupational History  . Not on file.   Social History Main Topics  . Smoking status: Former Smoker    Quit date: 10/29/1975  . Smokeless tobacco: Never Used  . Alcohol use No  . Drug use: No  . Sexual activity: Not on file   Other Topics Concern  . Not on file   Social History Narrative   03/01/15:    Pt. lives in [place]. [name] wife recently passed away 2 months ago.   Patient had a son and daughter. Son has  passed away.    Patient has a daughter that lives in [place]. 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      BP -114/66, P - 82, R- 16  Physical Exam:  Well appearing elderly man, NAD HEENT: Unremarkable Neck:  7 cm JVD, no thyromegally Back:  No CVA tenderness Lungs:  Clear with no wheezes, rales, or rhonchi. HEART:  IRegular rate rhythm, 2/6 systolic murmur at the right lower sternal border Abd:  soft, positive bowel sounds, no organomegally, no rebound, no guarding Ext:  2 plus pulses, no edema, no cyanosis, no clubbing Skin:  No rashes no nodules Neuro:  CN II through XII intact, motor grossly intact  ECG - atrial fib with ventricular pacing  DEVICE  Normal device function.  See PaceArt for details. Device in the VVI mode   Assess/Plan: 1. Atrial fib - his ventricular rate is well controlled. No change in meds. 2. PPM - his medtronic divice is programmed VVIR. 9 years on the battery. Will recheck in several months. 3. Weight loss - I note he has lost 16 lbs since last year. He is encouraged to eat more. 4. Aortic valve disease - his exam is good and he is asymptomatic, s/p TAVR.  Leonia Reeves.D.

## 2015-12-06 NOTE — Patient Instructions (Signed)
Medication Instructions:  Your physician recommends that you continue on your current medications as directed. Please refer to the Current Medication list given to you today.   Labwork: None Ordered   Testing/Procedures: None Ordered   Follow-Up: Remote monitoring is used to monitor your Pacemaker of ICD from home. This monitoring reduces the number of office visits required to check your device to one time per year. It allows us to keep an eye on the functioning of your device to ensure it is working properly. You are scheduled for a device check from home on 03/06/16. You may send your transmission at any time that day. If you have a wireless device, the transmission will be sent automatically. After your physician reviews your transmission, you will receive a postcard with your next transmission date.  Your physician wants you to follow-up in: 9 Months with Dr. Ladona Ridgelaylor.  You will receive a reminder letter in the mail two months in advance. If you don't receive a letter, please call our office to schedule the follow-up appointment.   If you need a refill on your cardiac medications before your next appointment, please call your pharmacy.   Thank you for choosing [facility]! Eligha BridegroomMichelle Swinyer, RN [phone]

## 2015-12-06 NOTE — Progress Notes (Signed)
Cardiology Office Note    Date:  12/06/2015   ID:  Ethan Lawrence, DOB 04/06/1922, MRN [id number]  PCP:  PROVIDER NOT IN SYSTEM  Cardiologist: Lesleigh NoeHenry W Ruhan Borak III, MD   Chief Complaint  Patient presents with  . Coronary Artery Disease    History of Present Illness:  Ethan Lawrence is a 82 y.o. male who presents for Coronary artery disease, CABG 4 in 2006, chronic atrial fibrillation, permanent pacemaker for high-grade AV block, aortic valve replacement with a tissue prosthesis following aortic valve surgery, an chronic anticoagulation therapy.Recurrent bacteremia raising question of endocarditis related to pacemaker or valve.  He is doing well. Appetite is improving. He just completed a six-week course of antibiotic therapy for recurrent bacteremia. He has a bioprosthetic aortic valve and also a permanent pacemaker. Concern is for device infection. Unfortunately, [place]. They have advised that he come here or to the [facility] [place] if recurrent chills and fever.  He has not had palpitations. No transient neurological complaints. No blood in the urine or stool. He denies orthopnea and PND.   Past Medical History:  Diagnosis Date  . AAA (abdominal aortic aneurysm) (HCC)    a. 06/2010 s/p Endovascular AAA Repair  . Anemia requiring transfusions    MOST RECENT WAS JUNE 2014 WITH HEART SURGERY  . Aortic stenosis    a. 08/2012 TEE: EF 45-55%, critical AS, Triv AI, mild to mod MR, Sev TR;  b. 08/2012 AVR (21mm Magna Ease pericardial tissue valve) & TV Repair (30mm MC3 annuloplasty ring).  . Arthritis    LEFT KNEE OA AND PAIN;  DJD LUMBAR  . Atrial fibrillation (HCC)   . Blood transfusion without reported diagnosis   . CAD (coronary artery disease)    a. 05/2004 CABGx4 (LIMA->LAD, VG->RI, VG->OM, VG->RCA);  b. 08/2012 Cath: Native 3VD with 3/4 patent grafts (VG->RI occluded).  . Cataract   . CHF (congestive  heart failure) (HCC)   . 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 Complication of anesthesia    STATES HE DOES WELL WITH DIPROVAN  . Hyperlipidemia   . Hypertension   . Macular degeneration   . Pacemaker   . Pain    PT HAS A LOT OF LOWER BACK PAIN- PREVIOUS BACK SURGERY AND DJD  . Shortness of breath    MOSTLY WITH EXERTION - IMPROVED SINCE HEART VALVE REPLACEMENT    Past Surgical History:  Procedure Laterality Date  . AORTIC VALVE REPLACEMENT N/A 09/01/2012   Procedure: REDO AORTIC VALVE REPLACEMENT (AVR);  Surgeon: Kerin PernaPeter Van Trigt, MD;  [facility];  Service: Open Heart Surgery;  Laterality: N/A;  . APPENDECTOMY    . BACK SURGERY    . CARDIAC CATHETERIZATION    . CARDIAC VALVE REPLACEMENT    . COLONOSCOPY  06/19/2011   Procedure: COLONOSCOPY;  Surgeon: Graylin ShiverSalem F Ganem, MD;  [facility];  Service: Gastroenterology;  Laterality: N/A;  . CORONARY ARTERY BYPASS GRAFT    . ENDOVASCULAR STENT INSERTION  07/05/2010  . ESOPHAGOGASTRODUODENOSCOPY  06/19/2011   Procedure: ESOPHAGOGASTRODUODENOSCOPY (EGD);  Surgeon: Graylin ShiverSalem F Ganem, MD;  [facility];  Service: Gastroenterology;  Laterality: N/A;  . EYE SURGERY     bilater cataracts removed  . HERNIA REPAIR    . INTRAOPERATIVE TRANSESOPHAGEAL ECHOCARDIOGRAM N/A 09/01/2012   Procedure: INTRAOPERATIVE TRANSESOPHAGEAL ECHOCARDIOGRAM;  Surgeon: Kerin PernaPeter Van Trigt, MD;  [facility];  Service: Open Heart Surgery;  Laterality: N/A;  . JOINT REPLACEMENT    . PACEMAKER INSERTION  09/07/2012   Medtronic Sensia single chamber pacemaker implanted by Dr Ladona Ridgel   . PERMANENT PACEMAKER INSERTION N/A 09/07/2012   Procedure: PERMANENT PACEMAKER INSERTION;  Surgeon: Marinus Maw, MD;  Location: [facility] CATH LAB;  Service: Cardiovascular;  Laterality: N/A;  . PROSTATE SURGERY    . SPINE SURGERY    . SURGERY ABOUT 50 YRS AGO FOR PERFORATED ULCER AND REPAIR OF HIATAL HERNIA    . TEE WITHOUT CARDIOVERSION  N/A 02/27/2015   Procedure: TRANSESOPHAGEAL ECHOCARDIOGRAM (TEE);  Surgeon: Chilton Si, MD;  Location: [facility] ENDOSCOPY;  Service: Cardiovascular;  Laterality: N/A;  . TOTAL KNEE ARTHROPLASTY Left 02/15/2013   Procedure: LEFT TOTAL KNEE ARTHROPLASTY;  Surgeon: Shelda Pal, MD;  Location: [facility] ORS;  Service: Orthopedics;  Laterality: Left;  . TRICUSPID VALVE REPLACEMENT N/A 09/01/2012   Procedure: TRICUSPID VALVE REPAIR;  Surgeon: Kerin Perna, MD;  [facility] OR;  Service: Open Heart Surgery;  Laterality: N/A;  . VASCULAR SURGERY      Current Medications: Outpatient Medications Prior to Visit  Medication Sig Dispense Refill  . acetaminophen (TYLENOL) 325 MG tablet Take 650 mg by mouth. Pt. receives Norco PRN.  Do not exceed 4000 mg of Tylenol in 24 hours    . folic acid (FOLVITE) 1 MG tablet Take 1 mg by mouth daily.     . furosemide (LASIX) 20 MG tablet Take 20 mg by mouth daily. Prn with feet swelling    . HYDROcodone-acetaminophen (NORCO) 10-325 MG tablet Take 1 tablet by mouth every 4 (four) hours as needed for moderate pain.    Marland Kitchen HYDROcodone-acetaminophen (NORCO) 10-325 MG tablet Take 1 tablet by mouth at bedtime.    . methocarbamol (ROBAXIN) 500 MG tablet Take 500 mg by mouth every 6 (six) hours as needed for muscle spasms.    . Multiple Vitamins-Minerals (PRESERVISION AREDS 2 PO) Take 1 capsule by mouth 2 (two) times daily.    Marland Kitchen omeprazole (PRILOSEC) 20 MG capsule Take 20 mg by mouth daily.    . predniSONE (DELTASONE) 5 MG tablet Take 5 mg by mouth daily with breakfast.    . Protein (PROCEL PO) Take 1 scoop by mouth 2 (two) times daily.    Marland Kitchen rOPINIRole (REQUIP) 1 MG tablet Take 1 mg by mouth daily.    . simvastatin (ZOCOR) 20 MG tablet Take 20 mg by mouth at bedtime.     . traMADol (ULTRAM) 50 MG tablet Take 25 mg by mouth daily.    Marland Kitchen warfarin (COUMADIN) 5 MG tablet Take 5 mg by mouth daily.    Marland Kitchen gabapentin (NEURONTIN) 300 MG capsule Take 300 mg by mouth 2 (two) times daily.      Marland Kitchen HYDROcodone-acetaminophen (NORCO) 10-325 MG tablet Take 1.5 tablets by mouth every 4 (four) hours as needed for severe pain.     No facility-administered medications prior to visit.      Allergies:   Tape and Vancomycin   Social History   Social History  . Marital status: Married    Spouse name: N/A  . Number of children: 2  . Years of education: N/A   Social History Main Topics  . Smoking status: Former Smoker    Quit date: 10/29/1975  . Smokeless tobacco: Never Used  . Alcohol use No  . Drug use: No  . Sexual activity: Not Asked   Other Topics Concern  . None  Social History Narrative   03/01/15:    Pt. lives in [place]. [name] wife recently passed away 2 months ago.   Patient had a son and daughter. Son has passed away.    Patient has a daughter that lives in [place]. 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      Family History:  The patient's family history includes Alzheimer's disease in his brother; Coronary artery disease in his brother; Emphysema in his father; Healthy in his mother and sister.   ROS:   Please see the history of present illness.    Back pain, leg pain, excessive fatigue, recent chills and fever treated with prolonged antibiotic therapy.  All other systems reviewed and are negative.   PHYSICAL EXAM:   VS:  BP 114/66   Pulse 82   Ht 5\' 10"  (1.778 m)   Wt 154 lb (69.9 kg)   BMI 22.10 kg/m    GEN: Well nourished, well developed, in no acute distress . Elderly and somewhat frail appearing. Despite his recent illness, he appears than his stated age. HEENT: normal  Neck: no JVD, carotid bruits, or masses Cardiac: RRR; no diastolic or systolic murmurs, rubs, or gallops,no edema . Respiratory:  clear to auscultation bilaterally, normal work of breathing GI: soft, nontender, nondistended, + BS MS: no deformity or atrophy  Skin: warm and dry, no rash Neuro:  Alert  and Oriented x 3, Strength and sensation are intact Psych: euthymic mood, full affect  Wt Readings from Last 3 Encounters:  12/06/15 154 lb (69.9 kg)  05/17/15 158 lb (71.7 kg)  03/23/15 180 lb (81.6 kg)      Studies/Labs Reviewed:   EKG:  EKG  Ventricular pacing, background atrial fibrillation, no change when compared to prior. Rate 82 bpm  Recent Labs: 02/21/2015: Brain Natriuretic Peptide 279.6 02/25/2015: ALT 10 03/05/2015: BUN 18; Creatinine, Ser 1.57; Potassium 3.3; Sodium 138 03/08/2015: Hemoglobin 9.1; Platelets 196   Lipid Panel No results found for: CHOL, TRIG, HDL, CHOLHDL, VLDL, LDLCALC, LDLDIRECT  Additional studies/ records that were reviewed today include:  No new data is reviewed    ASSESSMENT:    1. Chronic a-fib on Coumadin   2. Coronary artery disease involving native coronary artery of native heart without angina pectoris   3. Essential hypertension   4. Prosthetic valve endocarditis, subsequent encounter   5. Chronic diastolic heart failure (HCC)   6. Aneurysm of abdominal vessel (HCC)      PLAN:  In order of problems listed above:  1. The pacing. Stable on anticoagulation therapy. 2. Asymptomatic. 3. Excellent control 4. Has now had a recurrent episode of sepsis/bacteremia. These episodes occurred 3 months apart. He just completed a six-week course of antibiotic therapy at home in [place]. Still has a PICC line. May need to have device explant preceded by TEE if he develops recurrent bacteremia. Discussed with the patient and his daughter that if he develops recurrent fevers and chills he should come to [facility] rather than his [facility]. 5. No evidence of volume overload    Medication Adjustments/Labs and Tests Ordered: Current medicines are reviewed at length with the patient today.  Concerns regarding medicines are outlined above.  Medication changes, Labs and Tests ordered today are listed in the Patient Instructions  below. Patient Instructions  Medication Instructions:  Your physician recommends that you continue on your current medications as directed. Please refer to the Current Medication list given  to you today.   Labwork: None ordered  Testing/Procedures: None ordered  Follow-Up: Your physician wants you to follow-up in: 9 months with Dr.Schwanda Zima You will receive a reminder letter in the mail two months in advance. If you don't receive a letter, please call our office to schedule the follow-up appointment.   Any Other Special Instructions Will Be Listed Below (If Applicable).     If you need a refill on your cardiac medications before your next appointment, please call your pharmacy.      Signed, Lesleigh Noe, MD  12/06/2015 8:58 AM     [facility] [address] [place], [place], [place]  [address] Phone: [phone]; Fax: [phone]

## 2015-12-06 NOTE — Patient Instructions (Signed)
Medication Instructions:  Your physician recommends that you continue on your current medications as directed. Please refer to the Current Medication list given to you today.   Labwork: None ordered  Testing/Procedures: None ordered  Follow-Up: Your physician wants you to follow-up in: 9 months with Dr.Smith You will receive a reminder letter in the mail two months in advance. If you don't receive a letter, please call our office to schedule the follow-up appointment.   Any Other Special Instructions Will Be Listed Below (If Applicable).     If you need a refill on your cardiac medications before your next appointment, please call your pharmacy.   

## 2015-12-29 ENCOUNTER — Inpatient Hospital Stay (HOSPITAL_COMMUNITY)
Admission: AD | Admit: 2015-12-29 | Discharge: 2016-01-08 | DRG: 065 | Disposition: A | Discharge status: Skilled Nursing Facility | Payer: Medicare PPO | Source: Other Acute Inpatient Hospital | Attending: Internal Medicine | Admitting: Internal Medicine

## 2015-12-29 DIAGNOSIS — W19XXXA Unspecified fall, initial encounter: Secondary | ICD-10-CM | POA: Diagnosis present

## 2015-12-29 DIAGNOSIS — N179 Acute kidney failure, unspecified: Secondary | ICD-10-CM | POA: Diagnosis present

## 2015-12-29 DIAGNOSIS — I2690 Septic pulmonary embolism without acute cor pulmonale: Secondary | ICD-10-CM | POA: Diagnosis not present

## 2015-12-29 DIAGNOSIS — B955 Unspecified streptococcus as the cause of diseases classified elsewhere: Secondary | ICD-10-CM | POA: Diagnosis present

## 2015-12-29 DIAGNOSIS — M2500 Hemarthrosis, unspecified joint: Secondary | ICD-10-CM | POA: Diagnosis present

## 2015-12-29 DIAGNOSIS — Z7952 Long term (current) use of systemic steroids: Secondary | ICD-10-CM

## 2015-12-29 DIAGNOSIS — I330 Acute and subacute infective endocarditis: Secondary | ICD-10-CM | POA: Diagnosis not present

## 2015-12-29 DIAGNOSIS — B351 Tinea unguium: Secondary | ICD-10-CM | POA: Diagnosis present

## 2015-12-29 DIAGNOSIS — Z952 Presence of prosthetic heart valve: Secondary | ICD-10-CM | POA: Diagnosis not present

## 2015-12-29 DIAGNOSIS — M25462 Effusion, left knee: Secondary | ICD-10-CM | POA: Diagnosis present

## 2015-12-29 DIAGNOSIS — M4856XA Collapsed vertebra, not elsewhere classified, lumbar region, initial encounter for fracture: Secondary | ICD-10-CM | POA: Diagnosis present

## 2015-12-29 DIAGNOSIS — R471 Dysarthria and anarthria: Secondary | ICD-10-CM | POA: Diagnosis present

## 2015-12-29 DIAGNOSIS — Z820 Family history of epilepsy and other diseases of the nervous system: Secondary | ICD-10-CM

## 2015-12-29 DIAGNOSIS — D696 Thrombocytopenia, unspecified: Secondary | ICD-10-CM | POA: Diagnosis present

## 2015-12-29 DIAGNOSIS — Z8679 Personal history of other diseases of the circulatory system: Secondary | ICD-10-CM | POA: Diagnosis not present

## 2015-12-29 DIAGNOSIS — I6789 Other cerebrovascular disease: Secondary | ICD-10-CM | POA: Diagnosis not present

## 2015-12-29 DIAGNOSIS — I340 Nonrheumatic mitral (valve) insufficiency: Secondary | ICD-10-CM | POA: Diagnosis not present

## 2015-12-29 DIAGNOSIS — I10 Essential (primary) hypertension: Secondary | ICD-10-CM | POA: Diagnosis present

## 2015-12-29 DIAGNOSIS — Z87891 Personal history of nicotine dependence: Secondary | ICD-10-CM

## 2015-12-29 DIAGNOSIS — I482 Chronic atrial fibrillation, unspecified: Secondary | ICD-10-CM | POA: Diagnosis present

## 2015-12-29 DIAGNOSIS — G459 Transient cerebral ischemic attack, unspecified: Secondary | ICD-10-CM | POA: Diagnosis not present

## 2015-12-29 DIAGNOSIS — B354 Tinea corporis: Secondary | ICD-10-CM | POA: Diagnosis present

## 2015-12-29 DIAGNOSIS — Z951 Presence of aortocoronary bypass graft: Secondary | ICD-10-CM

## 2015-12-29 DIAGNOSIS — Z825 Family history of asthma and other chronic lower respiratory diseases: Secondary | ICD-10-CM | POA: Diagnosis not present

## 2015-12-29 DIAGNOSIS — Z950 Presence of cardiac pacemaker: Secondary | ICD-10-CM | POA: Diagnosis present

## 2015-12-29 DIAGNOSIS — R7881 Bacteremia: Secondary | ICD-10-CM | POA: Diagnosis not present

## 2015-12-29 DIAGNOSIS — M25569 Pain in unspecified knee: Secondary | ICD-10-CM

## 2015-12-29 DIAGNOSIS — R3915 Urgency of urination: Secondary | ICD-10-CM | POA: Diagnosis not present

## 2015-12-29 DIAGNOSIS — R933 Abnormal findings on diagnostic imaging of other parts of digestive tract: Secondary | ICD-10-CM | POA: Diagnosis not present

## 2015-12-29 DIAGNOSIS — Z452 Encounter for adjustment and management of vascular access device: Secondary | ICD-10-CM

## 2015-12-29 DIAGNOSIS — Z8673 Personal history of transient ischemic attack (TIA), and cerebral infarction without residual deficits: Secondary | ICD-10-CM

## 2015-12-29 DIAGNOSIS — M8448XG Pathological fracture, other site, subsequent encounter for fracture with delayed healing: Secondary | ICD-10-CM | POA: Diagnosis not present

## 2015-12-29 DIAGNOSIS — Z7901 Long term (current) use of anticoagulants: Secondary | ICD-10-CM

## 2015-12-29 DIAGNOSIS — I5032 Chronic diastolic (congestive) heart failure: Secondary | ICD-10-CM | POA: Diagnosis present

## 2015-12-29 DIAGNOSIS — R609 Edema, unspecified: Secondary | ICD-10-CM

## 2015-12-29 DIAGNOSIS — Z8249 Family history of ischemic heart disease and other diseases of the circulatory system: Secondary | ICD-10-CM

## 2015-12-29 DIAGNOSIS — N433 Hydrocele, unspecified: Secondary | ICD-10-CM | POA: Diagnosis present

## 2015-12-29 DIAGNOSIS — I639 Cerebral infarction, unspecified: Principal | ICD-10-CM | POA: Diagnosis present

## 2015-12-29 DIAGNOSIS — T80211D Bloodstream infection due to central venous catheter, subsequent encounter: Secondary | ICD-10-CM

## 2015-12-29 DIAGNOSIS — E785 Hyperlipidemia, unspecified: Secondary | ICD-10-CM | POA: Diagnosis present

## 2015-12-29 DIAGNOSIS — D649 Anemia, unspecified: Secondary | ICD-10-CM | POA: Diagnosis present

## 2015-12-29 DIAGNOSIS — Q211 Atrial septal defect: Secondary | ICD-10-CM | POA: Diagnosis not present

## 2015-12-29 DIAGNOSIS — K439 Ventral hernia without obstruction or gangrene: Secondary | ICD-10-CM

## 2015-12-29 DIAGNOSIS — I2510 Atherosclerotic heart disease of native coronary artery without angina pectoris: Secondary | ICD-10-CM | POA: Diagnosis present

## 2015-12-29 DIAGNOSIS — R0600 Dyspnea, unspecified: Secondary | ICD-10-CM

## 2015-12-29 DIAGNOSIS — M25562 Pain in left knee: Secondary | ICD-10-CM | POA: Diagnosis present

## 2015-12-29 DIAGNOSIS — I130 Hypertensive heart and chronic kidney disease with heart failure and stage 1 through stage 4 chronic kidney disease, or unspecified chronic kidney disease: Secondary | ICD-10-CM | POA: Diagnosis present

## 2015-12-29 DIAGNOSIS — R4182 Altered mental status, unspecified: Secondary | ICD-10-CM | POA: Diagnosis not present

## 2015-12-29 DIAGNOSIS — Z881 Allergy status to other antibiotic agents status: Secondary | ICD-10-CM | POA: Diagnosis not present

## 2015-12-29 DIAGNOSIS — R4701 Aphasia: Secondary | ICD-10-CM | POA: Diagnosis present

## 2015-12-29 DIAGNOSIS — R350 Frequency of micturition: Secondary | ICD-10-CM | POA: Diagnosis not present

## 2015-12-29 DIAGNOSIS — I454 Nonspecific intraventricular block: Secondary | ICD-10-CM | POA: Diagnosis present

## 2015-12-29 DIAGNOSIS — R400 Somnolence: Secondary | ICD-10-CM

## 2015-12-29 DIAGNOSIS — B954 Other streptococcus as the cause of diseases classified elsewhere: Secondary | ICD-10-CM | POA: Diagnosis not present

## 2015-12-29 DIAGNOSIS — N183 Chronic kidney disease, stage 3 (moderate): Secondary | ICD-10-CM | POA: Diagnosis present

## 2015-12-29 DIAGNOSIS — H3530 Unspecified macular degeneration: Secondary | ICD-10-CM | POA: Diagnosis present

## 2015-12-29 DIAGNOSIS — R5383 Other fatigue: Secondary | ICD-10-CM

## 2015-12-29 DIAGNOSIS — R531 Weakness: Secondary | ICD-10-CM | POA: Diagnosis present

## 2015-12-29 DIAGNOSIS — Z96652 Presence of left artificial knee joint: Secondary | ICD-10-CM | POA: Diagnosis present

## 2015-12-29 HISTORY — DX: Acute and subacute endocarditis, unspecified: I33.9

## 2015-12-29 HISTORY — DX: Infection and inflammatory reaction due to other cardiac and vascular devices, implants and grafts, initial encounter: T82.7XXA

## 2015-12-29 HISTORY — DX: Streptococcal infection, unspecified site: A49.1

## 2015-12-29 HISTORY — DX: Gastrointestinal hemorrhage, unspecified: K92.2

## 2015-12-29 MED ORDER — AMOXICILLIN-POT CLAVULANATE 875-125 MG PO TABS
1.0000 | ORAL_TABLET | Freq: Two times a day (BID) | ORAL | Status: DC
  Administered 2015-12-29 – 2015-12-31 (×4): 1 via ORAL
  Filled 2015-12-29 (×4): qty 1

## 2015-12-29 MED ORDER — ACETAMINOPHEN 325 MG PO TABS
650.0000 mg | ORAL_TABLET | Freq: Four times a day (QID) | ORAL | Status: DC | PRN
  Administered 2016-01-06 – 2016-01-07 (×2): 650 mg via ORAL
  Filled 2015-12-29 (×4): qty 2

## 2015-12-29 MED ORDER — FERROUS SULFATE 325 (65 FE) MG PO TABS
325.0000 mg | ORAL_TABLET | Freq: Every day | ORAL | Status: DC
  Administered 2015-12-30 – 2016-01-04 (×5): 325 mg via ORAL
  Filled 2015-12-29 (×5): qty 1

## 2015-12-29 MED ORDER — DOCUSATE SODIUM 100 MG PO CAPS
100.0000 mg | ORAL_CAPSULE | Freq: Every day | ORAL | Status: DC | PRN
  Administered 2015-12-31: 100 mg via ORAL
  Filled 2015-12-29: qty 1

## 2015-12-29 MED ORDER — NITROFURANTOIN MONOHYD MACRO 100 MG PO CAPS
100.0000 mg | ORAL_CAPSULE | Freq: Two times a day (BID) | ORAL | Status: DC
  Administered 2015-12-30 – 2015-12-31 (×4): 100 mg via ORAL
  Filled 2015-12-29 (×5): qty 1

## 2015-12-29 MED ORDER — STROKE: EARLY STAGES OF RECOVERY BOOK
Freq: Once | Status: AC
  Administered 2015-12-29

## 2015-12-29 MED ORDER — SIMVASTATIN 20 MG PO TABS
20.0000 mg | ORAL_TABLET | Freq: Every day | ORAL | Status: DC
  Administered 2015-12-29 – 2016-01-07 (×10): 20 mg via ORAL
  Filled 2015-12-29 (×10): qty 1

## 2015-12-29 MED ORDER — PREDNISONE 5 MG PO TABS
5.0000 mg | ORAL_TABLET | Freq: Every day | ORAL | Status: DC
  Administered 2015-12-30 – 2016-01-08 (×9): 5 mg via ORAL
  Filled 2015-12-29 (×9): qty 1

## 2015-12-29 MED ORDER — HYDROCODONE-ACETAMINOPHEN 10-325 MG PO TABS
1.0000 | ORAL_TABLET | Freq: Four times a day (QID) | ORAL | Status: DC | PRN
  Administered 2015-12-30 – 2016-01-08 (×25): 1 via ORAL
  Filled 2015-12-29 (×28): qty 1

## 2015-12-29 MED ORDER — FOLIC ACID 1 MG PO TABS
1.0000 mg | ORAL_TABLET | Freq: Every day | ORAL | Status: DC
  Administered 2015-12-30 – 2016-01-08 (×9): 1 mg via ORAL
  Filled 2015-12-29 (×9): qty 1

## 2015-12-29 MED ORDER — ROPINIROLE HCL 1 MG PO TABS
2.0000 mg | ORAL_TABLET | Freq: Every day | ORAL | Status: DC
  Administered 2015-12-30 – 2016-01-07 (×9): 2 mg via ORAL
  Filled 2015-12-29 (×9): qty 2

## 2015-12-29 NOTE — H&P (Addendum)
History and Physical    Ethan Lawrence ZOX:[id number] DOB: 10/30/1922 DOA: 12/29/2015   PCP: PROVIDER NOT IN SYSTEM Dr. Renea Ee M.D. Chief Complaint: Stroke like symptoms  HPI: Ethan Lawrence is a 82 y.o. male with medical history significant of Prosthetic aortic valve replacement, pacemaker for complete heart block.  In December of 2016 he had S.Viridans bacteremia, TEE revealed a small mobile structure (vegetation vs suture) on the ring of the bioprosthetic aortic valve.  Patient treated with 6 weeks of rocephin.  He apparently had another bout of recurrent bacteremia in ~March or so (6 weeks ABX), and yet a 3rd for which he just finished up his ABx for about a month ago.  Despite finishing ABx his PICC line was left in.  And earlier this week he developed fever 103, and wasn't feeling well.  He was admitted earlier this week to [facility].  Per family the "Picc line was source of infection and was removed".  Patient was discharged on Augmentin and macrobid.  Today he developed L sided weakness, aphasia.  BGL initially low and was corrected but Aphasia persists.  He was transferred from [facility] to [facility] for neurology consultation.  ED Course: CT head is negative, Tm 99.7, WBC WNL, INR 2.0, HGB 8.9 (appears to have chronic anemia in this range).  Creat 1.7 (appears to be about baseline).  Review of Systems: As per HPI otherwise 10 point review of systems negative.    Past Medical History:  Diagnosis Date  . AAA (abdominal aortic aneurysm) (HCC)    a. 06/2010 s/p Endovascular AAA Repair  . Anemia requiring transfusions    MOST RECENT WAS JUNE 2014 WITH HEART SURGERY  . Aortic stenosis    a. 08/2012 TEE: EF 45-55%, critical AS, Triv AI, mild to mod MR, Sev TR;  b. 08/2012 AVR (21mm Magna Ease pericardial tissue valve) & TV Repair (30mm MC3 annuloplasty ring).  . Arthritis    LEFT KNEE OA AND PAIN;  DJD LUMBAR  . Atrial fibrillation (HCC)    . Blood transfusion without reported diagnosis   . CAD (coronary artery disease)    a. 05/2004 CABGx4 (LIMA->LAD, VG->RI, VG->OM, VG->RCA);  b. 08/2012 Cath: Native 3VD with 3/4 patent grafts (VG->RI occluded).  . Cataract   . CHF (congestive heart failure) (HCC)   . 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 Complication of anesthesia    STATES HE DOES WELL WITH DIPROVAN  . Hyperlipidemia   . Hypertension   . Macular degeneration   . Pacemaker   . Pain    PT HAS A LOT OF LOWER BACK PAIN- PREVIOUS BACK SURGERY AND DJD  . Shortness of breath    MOSTLY WITH EXERTION - IMPROVED SINCE HEART VALVE REPLACEMENT    Past Surgical History:  Procedure Laterality Date  . AORTIC VALVE REPLACEMENT N/A 09/01/2012   Procedure: REDO AORTIC VALVE REPLACEMENT (AVR);  Surgeon: Kerin Perna, MD;  Location: [facility] OR;  Service: Open Heart Surgery;  Laterality: N/A;  . APPENDECTOMY    . BACK SURGERY    . CARDIAC CATHETERIZATION    . CARDIAC VALVE REPLACEMENT    . COLONOSCOPY  06/19/2011   Procedure: COLONOSCOPY;  Surgeon: Graylin Shiver, MD;  Location[facility] OR;  Service: Gastroenterology;  Laterality: N/A;  . CORONARY ARTERY BYPASS GRAFT    . ENDOVASCULAR STENT INSERTION  07/05/2010  . ESOPHAGOGASTRODUODENOSCOPY  06/19/2011  Procedure: ESOPHAGOGASTRODUODENOSCOPY (EGD);  Surgeon: Graylin Shiver, MD;  Location: [facility] OR;  Service: Gastroenterology;  Laterality: N/A;  . EYE SURGERY     bilater cataracts removed  . HERNIA REPAIR    . INTRAOPERATIVE TRANSESOPHAGEAL ECHOCARDIOGRAM N/A 09/01/2012   Procedure: INTRAOPERATIVE TRANSESOPHAGEAL ECHOCARDIOGRAM;  Surgeon: Kerin Perna, MD;  Location: [facility] OR;  Service: Open Heart Surgery;  Laterality: N/A;  . JOINT REPLACEMENT    . PACEMAKER INSERTION  09/07/2012   Medtronic Sensia single chamber pacemaker implanted by Dr Ladona Ridgel   . PERMANENT PACEMAKER INSERTION N/A 09/07/2012   Procedure: PERMANENT PACEMAKER INSERTION;   Surgeon: Marinus Maw, MD;  Location: [facility] CATH LAB;  Service: Cardiovascular;  Laterality: N/A;  . PROSTATE SURGERY    . SPINE SURGERY    . SURGERY ABOUT 50 YRS AGO FOR PERFORATED ULCER AND REPAIR OF HIATAL HERNIA    . TEE WITHOUT CARDIOVERSION N/A 02/27/2015   Procedure: TRANSESOPHAGEAL ECHOCARDIOGRAM (TEE);  Surgeon: Chilton Si, MD;  Location: [facility] ENDOSCOPY;  Service: Cardiovascular;  Laterality: N/A;  . TOTAL KNEE ARTHROPLASTY Left 02/15/2013   Procedure: LEFT TOTAL KNEE ARTHROPLASTY;  Surgeon: Shelda Pal, MD;  Location: [facility] ORS;  Service: Orthopedics;  Laterality: Left;  . TRICUSPID VALVE REPLACEMENT N/A 09/01/2012   Procedure: TRICUSPID VALVE REPAIR;  Surgeon: Kerin Perna, MD;  Location: [facility] OR;  Service: Open Heart Surgery;  Laterality: N/A;  . VASCULAR SURGERY       reports that he quit smoking about 40 years ago. He has never used smokeless tobacco. He reports that he does not drink alcohol or use drugs.  Allergies  Allergen Reactions  . Tape Other (See Comments)    Pulls skin off  . Vancomycin Other (See Comments)    Hearing loss    Family History  Problem Relation Age of Onset  . Coronary artery disease Brother   . Healthy Mother   . Emphysema Father   . Healthy Sister   . Alzheimer's disease Brother       Prior to Admission medications   Medication Sig Start Date End Date Taking? Authorizing Provider  acetaminophen (TYLENOL) 325 MG tablet Take 650 mg by mouth. Pt. receives Norco PRN.  Do not exceed 4000 mg of Tylenol in 24 hours    Historical Provider, MD  folic acid (FOLVITE) 1 MG tablet Take 1 mg by mouth daily.     Historical Provider, MD  furosemide (LASIX) 20 MG tablet Take 20 mg by mouth daily. Prn with feet swelling    Historical Provider, MD  gabapentin (NEURONTIN) 300 MG capsule Take 300 mg by mouth at bedtime.    Historical Provider, MD  HYDROcodone-acetaminophen (NORCO) 10-325 MG tablet Take 1 tablet by mouth every 4 (four) hours as needed for  moderate pain.    Historical Provider, MD  methocarbamol (ROBAXIN) 500 MG tablet Take 500 mg by mouth every 6 (six) hours as needed for muscle spasms.    Historical Provider, MD  Multiple Vitamins-Minerals (PRESERVISION AREDS 2 PO) Take 1 capsule by mouth 2 (two) times daily.    Historical Provider, MD  omeprazole (PRILOSEC) 20 MG capsule Take 20 mg by mouth daily.    Historical Provider, MD  predniSONE (DELTASONE) 5 MG tablet Take 5 mg by mouth daily with breakfast.    Historical Provider, MD  Protein (PROCEL PO) Take 1 scoop by mouth 2 (two) times daily.    Historical Provider, MD  rOPINIRole (REQUIP) 1 MG tablet Take 1 mg by mouth daily.  01/09/15   Historical Provider, MD  simvastatin (ZOCOR) 20 MG tablet Take 20 mg by mouth at bedtime.     Historical Provider, MD  traMADol (ULTRAM) 50 MG tablet Take 25 mg by mouth daily.    Historical Provider, MD  warfarin (COUMADIN) 5 MG tablet Take 5 mg by mouth daily.    Historical Provider, MD    Physical Exam: Vitals:   12/29/15 2205 12/29/15 2300  BP: (!) 133/93   Pulse: 94   Resp: 20   Temp: 99.7 F (37.6 C) (P) 99.5 F (37.5 C)  TempSrc: Oral   SpO2: 97%       Constitutional: NAD, calm, comfortable Eyes: PERRL, lids and conjunctivae normal ENMT: Mucous membranes are moist. Posterior pharynx clear of any exudate or lesions.Normal dentition.  Neck: normal, supple, no masses, no thyromegaly Respiratory: clear to auscultation bilaterally, no wheezing, no crackles. Normal respiratory effort. No accessory muscle use.  Cardiovascular: Regular rate and rhythm, no murmurs / rubs / gallops. No extremity edema. 2+ pedal pulses. No carotid bruits.  Abdomen: no tenderness, no masses palpated. No hepatosplenomegaly. Bowel sounds positive.  Musculoskeletal: no clubbing / cyanosis. No joint deformity upper and lower extremities. Good ROM, no contractures. Normal muscle tone.  Skin: no rashes, lesions, ulcers. No induration Neurologic: Mild broca's  aphasia, he states still has some L sided weakness but I cant really appreciate a deficit in the L side. Psychiatric: Normal judgment and insight. Alert and oriented x 3. Normal mood.    Labs on Admission: I have personally reviewed following labs and imaging studies  CBC: No results for input(s): WBC, NEUTROABS, HGB, HCT, MCV, PLT in the last 168 hours. Basic Metabolic Panel: No results for input(s): NA, K, CL, CO2, GLUCOSE, BUN, CREATININE, CALCIUM, MG, PHOS in the last 168 hours. GFR: CrCl cannot be calculated (Unknown ideal weight.). Liver Function Tests: No results for input(s): AST, ALT, ALKPHOS, BILITOT, PROT, ALBUMIN in the last 168 hours. No results for input(s): LIPASE, AMYLASE in the last 168 hours. No results for input(s): AMMONIA in the last 168 hours. Coagulation Profile: No results for input(s): INR, PROTIME in the last 168 hours. Cardiac Enzymes: No results for input(s): CKTOTAL, CKMB, CKMBINDEX, TROPONINI in the last 168 hours. BNP (last 3 results) No results for input(s): PROBNP in the last 8760 hours. HbA1C: No results for input(s): HGBA1C in the last 72 hours. CBG: No results for input(s): GLUCAP in the last 168 hours. Lipid Profile: No results for input(s): CHOL, HDL, LDLCALC, TRIG, CHOLHDL, LDLDIRECT in the last 72 hours. Thyroid Function Tests: No results for input(s): TSH, T4TOTAL, FREET4, T3FREE, THYROIDAB in the last 72 hours. Anemia Panel: No results for input(s): VITAMINB12, FOLATE, FERRITIN, TIBC, IRON, RETICCTPCT in the last 72 hours. Urine analysis:    Component Value Date/Time   COLORURINE YELLOW 03/04/2015 1331   APPEARANCEUR CLEAR 03/04/2015 1331   LABSPEC 1.009 03/04/2015 1331   PHURINE 6.0 03/04/2015 1331   GLUCOSEU NEGATIVE 03/04/2015 1331   HGBUR TRACE (A) 03/04/2015 1331   BILIRUBINUR NEGATIVE 03/04/2015 1331   BILIRUBINUR negative 02/23/2015 1635   KETONESUR NEGATIVE 03/04/2015 1331   PROTEINUR NEGATIVE 03/04/2015 1331    UROBILINOGEN 2.0 02/23/2015 1635   UROBILINOGEN 1.0 01/20/2013 1223   NITRITE NEGATIVE 03/04/2015 1331   LEUKOCYTESUR NEGATIVE 03/04/2015 1331   Sepsis Labs: @LABRCNTIP (procalcitonin:4,lacticidven:4) )No results found for this or any previous visit (from the past 240 hour(s)).   Radiological Exams on Admission: No results found.  EKG: Independently reviewed.  Assessment/Plan  Principal Problem:   Acute ischemic stroke [facility]) Active Problems:   Chronic a-fib on Coumadin   Essential hypertension   CRBSI (catheter-related bloodstream infection), subsequent encounter   History of bacterial endocarditis    1. Acute ischemic stroke - 1. Neuro consult 2. Per Dr. Roseanne Reno - continue coumadin for now 3. BCx 4. Stroke work up minus MRI (has pacer) 5. PT/OT/SLP 2. CRBSI - 1. Send for culture results from [place] 2. For now continue Augmentin and Macrobid 3. Repeat BCx now 3. H/o bacterial endocarditis - 1. BCx now and sending to [facility] for culture results 2. 2d echo ordered 3. If cultures show S.Viridans then he probably needs repeat TEE    DVT prophylaxis: Coumadin Code Status: Full Family Communication: Granddaughter at bedside Consults called: Neuro Admission status: Admit to inpatient   Hillary Bow DO [facility] Pager [phone] from 7PM-7AM  If 7AM-7PM, please contact the day physician for the patient [email or web address] Password [email or web address]  12/29/2015, 11:15 PM

## 2015-12-30 ENCOUNTER — Inpatient Hospital Stay (HOSPITAL_COMMUNITY): Payer: Medicare PPO

## 2015-12-30 ENCOUNTER — Encounter (HOSPITAL_COMMUNITY): Payer: Self-pay | Admitting: *Deleted

## 2015-12-30 DIAGNOSIS — Z7901 Long term (current) use of anticoagulants: Secondary | ICD-10-CM

## 2015-12-30 DIAGNOSIS — I639 Cerebral infarction, unspecified: Secondary | ICD-10-CM | POA: Diagnosis present

## 2015-12-30 DIAGNOSIS — I6789 Other cerebrovascular disease: Secondary | ICD-10-CM

## 2015-12-30 LAB — ECHOCARDIOGRAM COMPLETE
Height: 72 in
WEIGHTICAEL: 2373.91 [oz_av]

## 2015-12-30 LAB — CBC
HCT: 26.7 % — ABNORMAL LOW (ref 39.0–52.0)
Hemoglobin: 8.3 g/dL — ABNORMAL LOW (ref 13.0–17.0)
MCH: 28.7 pg (ref 26.0–34.0)
MCHC: 31.1 g/dL (ref 30.0–36.0)
MCV: 92.4 fL (ref 78.0–100.0)
Platelets: 98 K/uL — ABNORMAL LOW (ref 150–400)
RBC: 2.89 MIL/uL — ABNORMAL LOW (ref 4.22–5.81)
RDW: 17.4 % — ABNORMAL HIGH (ref 11.5–15.5)
WBC: 5.5 K/uL (ref 4.0–10.5)

## 2015-12-30 LAB — LIPID PANEL
Cholesterol: 89 mg/dL (ref 0–200)
HDL: 20 mg/dL — ABNORMAL LOW (ref >40)
LDL CALC: 53 mg/dL (ref 0–99)
TRIGLYCERIDES: 79 mg/dL (ref <150)
Total CHOL/HDL Ratio: 4.5 RATIO
VLDL: 16 mg/dL (ref 0–40)

## 2015-12-30 LAB — HEPARIN LEVEL (UNFRACTIONATED)
Heparin Unfractionated: <0.1 [IU]/mL — ABNORMAL LOW (ref 0.30–0.70)
Heparin Unfractionated: 0.15 IU/mL — ABNORMAL LOW (ref 0.30–0.70)

## 2015-12-30 LAB — PROTIME-INR
INR: 1.95
Prothrombin Time: 22.5 s — ABNORMAL HIGH (ref 11.4–15.2)

## 2015-12-30 LAB — GLUCOSE, CAPILLARY
GLUCOSE-CAPILLARY: 103 mg/dL — AB (ref 65–99)
GLUCOSE-CAPILLARY: 112 mg/dL — AB (ref 65–99)

## 2015-12-30 MED ORDER — HEPARIN (PORCINE) IN NACL 100-0.45 UNIT/ML-% IJ SOLN
1250.0000 [IU]/h | INTRAMUSCULAR | Status: DC
  Administered 2015-12-30: 700 [IU]/h via INTRAVENOUS
  Administered 2015-12-31: 1250 [IU]/h via INTRAVENOUS
  Administered 2015-12-31: 1100 [IU]/h via INTRAVENOUS
  Administered 2016-01-01: 1250 [IU]/h via INTRAVENOUS
  Filled 2015-12-30 (×5): qty 250

## 2015-12-30 MED ORDER — LEVOFLOXACIN IN D5W 500 MG/100ML IV SOLN: 500.0000 mg | INTRAVENOUS | Status: DC

## 2015-12-30 MED ORDER — WARFARIN SODIUM 7.5 MG PO TABS
7.5000 mg | ORAL_TABLET | Freq: Once | ORAL | Status: AC
  Administered 2015-12-30: 7.5 mg via ORAL
  Filled 2015-12-30: qty 1

## 2015-12-30 MED ORDER — LEVOFLOXACIN IN D5W 500 MG/100ML IV SOLN
500.0000 mg | Freq: Once | INTRAVENOUS | Status: AC
  Administered 2015-12-31: 500 mg via INTRAVENOUS
  Filled 2015-12-30: qty 100

## 2015-12-30 MED ORDER — WARFARIN - PHARMACIST DOSING INPATIENT
Freq: Every day | Status: DC
  Administered 2016-01-01 – 2016-01-05 (×2)

## 2015-12-30 NOTE — Evaluation (Signed)
Occupational Therapy Evaluation Patient Details Name: Ethan Lawrence MRN: [id number] DOB: [birth year] Today's Date: 12/30/2015    History of Present Illness 82 y.o.malewith medical history significant of Prosthetic aortic valve replacement, pacemaker for complete heart block. Pt presenting with acute onset of L sided weakness, aphasia. CT on 10/21 neg for acute infarct.   Clinical Impression   Currently pt requires max assist +2 for basic transfers and max assist for ADL. Pt presenting with impaired cognition, UB weakness and impaired coordination (L seems worse than R), and poor balance in sitting and standing impacting his independence and safety with ADL and functional mobility. Recommending SNF for follow up to maximize independence and safety with ADL and functional mobility prior to return home. Pt would benefit from continued skilled OT to address established goals.    Follow Up Recommendations  SNF;Supervision/Assistance - 24 hour    Equipment Recommendations  Other (comment) (TBD at next venue)    Recommendations for Other Services       Precautions / Restrictions Precautions Precautions: Fall Restrictions Weight Bearing Restrictions: No      Mobility Bed Mobility Overal bed mobility: Needs Assistance Bed Mobility: Supine to Sit     Supine to sit: Min assist     General bed mobility comments: Vcs for positioning and cues to initiate to come to EOB  Transfers Overall transfer level: Needs assistance Equipment used:  (2 person face to face with gait belt and chuck pad) Transfers: Sit to/from Stand;Stand Pivot Transfers Sit to Stand: Max assist;+2 physical assistance Stand pivot transfers: Max assist;+2 physical assistance       General transfer comment: 2 person face to face with gait belt and chuck pad    Balance Overall balance assessment: History of Falls;Needs assistance Sitting-balance support: Feet supported;Bilateral upper extremity  supported Sitting balance-Leahy Scale: Poor Sitting balance - Comments: L lateral lean. Max-min assist at times for sitting balance. Postural control: Left lateral lean Standing balance support: Bilateral upper extremity supported Standing balance-Leahy Scale: Zero                              ADL Overall ADL's : Needs assistance/impaired   Eating/Feeding Details (indicate cue type and reason): Encouraged daughter to let pt self feed as able; daughter reports pt was able to self feed last night, increased difficulty this morning. Grooming: Maximal assistance;Sitting   Upper Body Bathing: Maximal assistance;Sitting   Lower Body Bathing: Maximal assistance;+2 for physical assistance;Sit to/from stand   Upper Body Dressing : Maximal assistance;Sitting   Lower Body Dressing: Maximal assistance;+2 for physical assistance;Sit to/from stand   Toilet Transfer: Maximal assistance;+2 for physical assistance;Stand-pivot;BSC Toilet Transfer Details (indicate cue type and reason): Simulated by sit to stand from EOB with stand pivot to chair         Functional mobility during ADLs: Maximal assistance;+2 for physical assistance (for stand pivot only)       Vision Additional Comments: Difficult to assess due to impaired cognition   Perception     Praxis      Pertinent Vitals/Pain Pain Assessment: Faces Faces Pain Scale: Hurts even more Pain Location: back Pain Descriptors / Indicators: Aching;Sore Pain Intervention(s): Limited activity within patient's tolerance;Repositioned     Hand Dominance Right   Extremity/Trunk Assessment Upper Extremity Assessment Upper Extremity Assessment: Generalized weakness;Difficult to assess due to impaired cognition   Lower Extremity Assessment Lower Extremity Assessment: Defer to PT evaluation LLE Deficits / Details:  minimal active movement in LLE noted LLE Sensation: decreased light touch LLE Coordination: decreased fine  motor;decreased gross motor   Cervical / Trunk Assessment Cervical / Trunk Assessment: Kyphotic   Communication Communication Communication: Expressive difficulties   Cognition Arousal/Alertness: Lethargic Behavior During Therapy: Flat affect Overall Cognitive Status: Impaired/Different from baseline Area of Impairment: Orientation;Attention;Memory;Following commands;Safety/judgement;Awareness;Problem solving Orientation Level: Disoriented to;Situation;Time Current Attention Level: Focused Memory: Decreased short-term memory Following Commands: Follows one step commands with increased time Safety/Judgement: Decreased awareness of safety;Decreased awareness of deficits Awareness: Intellectual Problem Solving: Slow processing;Decreased initiation;Difficulty sequencing;Requires verbal cues;Requires tactile cues     General Comments       Exercises       Shoulder Instructions      Home Living Family/patient expects to be discharged to:: Private residence Living Arrangements: Children Available Help at Discharge: Family Type of Home: House Home Access: Ramped entrance     Home Layout: One level     Bathroom Shower/Tub: Chief Strategy Officer: Handicapped height         Additional Comments: pt lives alone but someone typically there with him. he is from [facility] and daughter lives here. pt and dtrs house each have 3 steps to enter with hand rails.       Prior Functioning/Environment                   OT Problem List: Decreased strength;Decreased range of motion;Decreased activity tolerance;Impaired balance (sitting and/or standing);Decreased cognition;Decreased safety awareness;Decreased knowledge of use of DME or AE;Decreased knowledge of precautions;Impaired UE functional use;Pain   OT Treatment/Interventions: Self-care/ADL training;Therapeutic exercise;Neuromuscular education;Energy conservation;DME and/or AE instruction;Therapeutic activities;Cognitive  remediation/compensation;Patient/family education;Balance training    OT Goals(Current goals can be found in the care plan section) Acute Rehab OT Goals Patient Stated Goal: per daughter to get back to being very active OT Goal Formulation: With family Time For Goal Achievement: 01/13/16 Potential to Achieve Goals: Fair ADL Goals Pt Will Perform Grooming: with min guard assist;standing Pt Will Transfer to Toilet: with min assist;ambulating;bedside commode Additional ADL Goal #1: Pt will follow 2 step command without cues in a minimally distracting environment. Additional ADL Goal #2: Pt will identify 2 ADL objects and use appropriately for ADL without cues.  OT Frequency: Min 2X/week   Barriers to D/C:            Co-evaluation PT/OT/SLP Co-Evaluation/Treatment: Yes Reason for Co-Treatment: Complexity of the patient's impairments (multi-system involvement);For patient/therapist safety;Necessary to address cognition/behavior during functional activity PT goals addressed during session: Mobility/safety with mobility OT goals addressed during session: ADL's and self-care      End of Session Equipment Utilized During Treatment: Gait belt Nurse Communication: Mobility status  Activity Tolerance: Patient tolerated treatment well Patient left: in chair;with call bell/phone within reach;with chair alarm set;with family/visitor present   Time: 2130-8657 OT Time Calculation (min): 32 min Charges:  OT General Charges $OT Visit: 1 Procedure OT Evaluation $OT Eval Moderate Complexity: 1 Procedure G-Codes:     Gaye Alken M.S., OTR/L Pager: [phone]  12/30/2015, 2:33 PM

## 2015-12-30 NOTE — Progress Notes (Signed)
SLP Cancellation Note  Patient Details Name: Providence LaniusCharles E Stingley MRN: [id number] DOB: 02-Mar-1923   Cancelled treatment:       Reason Eval/Treat Not Completed: Fatigue/lethargy limiting ability to participate.  Daughter present who reports that pt has not slept well over the last ~24 hours.  Pt only minimally responsive to auditory and tactile stimulation.  Daughter reports fluctuating expressive language deficits since initial onset of symptoms.  Sometimes pt is able to express himself clearly but other times his speech is "jibberish."  ST to follow up at next available appointment.     Sharman Garrott, Melanee SpryNicole L 12/30/2015, 12:34 PM

## 2015-12-30 NOTE — Significant Event (Signed)
Rapid Response Event Note RN called for pt more lethargic Overview: Time Called: 0620 Arrival Time: 0620 Event Type: Neurologic  Initial Focused Assessment: Pt alert answering questions appropriately, skin warm and dry. BP 130/80, HR 88, 96% 2L  Follows commands, speech clear, minimal expressive aphasia  Interventions: Head CT Plan of Care (if not transferred): Continue to monitor, call with any changes.  Event Summary: Name of Physician Notified: Dr. Roseanne RenoStewart  at [phone]    at    Outcome: Stayed in room and stabalized     [place]elle TerreSHULAR, Edessa Jakubowicz BloomingdalePaige

## 2015-12-30 NOTE — Progress Notes (Signed)
  Echocardiogram 2D Echocardiogram has been performed.  Cayci Mcnabb 12/30/2015, 10:16 AM

## 2015-12-30 NOTE — Progress Notes (Signed)
Progress Note    Ethan Lawrence  ZOX:[id number] DOB: 11-Dec-1922  DOA: 12/29/2015 PCP: PROVIDER NOT IN SYSTEM    Brief Narrative:   Chief complaint: Follow-up speech difficulty  Ethan Lawrence is an 82 y.o. male with a PMH of atrial fibrillation on Coumadin, heart block with pacemaker placement, hypertension, hyperlipidemia, coronary artery disease, prosthetic heart valve, CHF and AAA, transferred from [facility] for management of strokelike symptoms on 12/29/15. He has a history of bacteremia and heart valve vegetations in 2016 as well as recurrence in March 2017. He had an acute febrile illness earlier this week which was thought to be due to an indwelling PICC line. PICC line was removed. He had a CT scan of his head which showed no acute intracranial abnormality. MRI cannot be obtained because patient has a cardiac pacemaker. INR was 2.0 on admission.  Assessment/Plan:   Principal Problem:   Acute ischemic stroke [facility]) Neurology consulted on admission. Full stroke workup initiated. Continue telemetry. PT/OT/ST consults requested. LDL at goal at 53, continue Zocor. Follow-up hemoglobin A1c. Rapid response called at 7 AM secondary to increased lethargy. CT scan obtained which showed moderate chronic microvascular changes without evidence of a large acute infarct. Mild global atrophy. 2-D echo showed an EF of 40-45 percent, moderate aortic stenosis, no source of embolism. Right carotid duplex showed mild disease. Unable to scan left secondary to the patient's inability to keep his head turns.  Active Problems:   Coronary artery disease status post CABG Continue statin. Anticoagulated.    Chronic diastolic CHF, NYHA class II Compensated at this time.    History of complete heart block status post pacemaker Records reviewed, followed by Dr. Ladona Ridgel who reports his medtronic divice is programmed VVIR. 9 years on the battery.    Aortic  stenosis status post aortic valve replacement Goal INR 2.5.    Chronic a-fib on Coumadin Currently on IV heparin/Coumadin until INR 2.5.    Essential hypertension Not currently on antihypertensive therapies. Blood pressure not elevated.    CRBSI (catheter-related bloodstream infection), subsequent encounter/history of bacterial endocarditis Continue Augmentin. Monitor for fevers. If he has recurrent evidence of infection, may need to have his pacemaker removed.   Family Communication/Anticipated D/C date and plan/Code Status   DVT prophylaxis: Heparin/Coumadin ordered. Code Status: Full Code.  Family Communication: Granddaughter at bedside. Disposition Plan: Will need SNF placement for rehabilitation.   Medical Consultants:    Neurology   Procedures:   2-D echo 12/30/15:  Study Conclusions  - Left ventricle: The cavity size was normal. Wall thickness was   increased in a pattern of moderate LVH. Systolic function was   mildly to moderately reduced. The estimated ejection fraction was   in the range of 40% to 45%. - Aortic valve: There was moderate stenosis. Valve area (VTI): 1.19   cm^2. Valve area (Vmax): 1.15 cm^2. Valve area (Vmean): 1.11   cm^2. - Mitral valve: Moderately calcified annulus. There was moderate to   severe regurgitation. - Left atrium: The atrium was massively dilated. - Right atrium: The atrium was moderately dilated.  Right carotid duplex 12/30/15: Preliminary report:  1-39% right ICA plaquing.  Bilateral vertebral artery flow is antegrade.  Unable to scan left carotid secondary to patient's inability to keep head turned.  Anti-Infectives:    None  Subjective:   The patient is nonverbal. He is unable to speak coherently or follow commands. He seems restless.  Objective:    Vitals:  12/30/15 0005 12/30/15 0200 12/30/15 0406 12/30/15 0600  BP: 131/70 (!) 104/51 118/84 130/80  Pulse:  89 (!) 49 88  Resp: 18 16 (!) 22   Temp: 98 F  (36.7 C) 99.2 F (37.3 C) 99.5 F (37.5 C) 99.6 F (37.6 C)  TempSrc: Oral Oral Oral Oral  SpO2: 98% 98% 99%   Weight: 67.3 kg (148 lb 5.9 oz)     Height: 6' (1.829 m)       Intake/Output Summary (Last 24 hours) at 12/30/15 0850 Last data filed at 12/30/15 0131  Gross per 24 hour  Intake                0 ml  Output              200 ml  Net             -200 ml   Filed Weights   12/30/15 0005  Weight: 67.3 kg (148 lb 5.9 oz)    Exam: General exam: Appears restless Respiratory system: Clear to auscultation. Respiratory effort normal. Cardiovascular system: S1 & S2 heard, RRR. No JVD,  rubs, gallops or clicks. No murmurs. Gastrointestinal system: Abdomen is nondistended, soft and nontender. No organomegaly or masses felt. Normal bowel sounds heard. Central nervous system: Disoriented. Expressive aphasia apparent. Extremities: No clubbing,  or cyanosis. No edema. Skin: No rashes, lesions or ulcers. Psychiatry: Judgement and insight appear normal. Mood & affect appropriate.   Data Reviewed:   I have personally reviewed following labs and imaging studies:  Labs: Basic Metabolic Panel: No results for input(s): NA, K, CL, CO2, GLUCOSE, BUN, CREATININE, CALCIUM, MG, PHOS in the last 168 hours. GFR CrCl cannot be calculated ([name] most recent lab result is older than the maximum 21 days allowed.). Liver Function Tests: No results for input(s): AST, ALT, ALKPHOS, BILITOT, PROT, ALBUMIN in the last 168 hours. No results for input(s): LIPASE, AMYLASE in the last 168 hours. No results for input(s): AMMONIA in the last 168 hours. Coagulation profile  Recent Labs Lab 12/30/15 0508  INR 1.95    CBC:  Recent Labs Lab 12/30/15 0508  WBC 5.5  HGB 8.3*  HCT 26.7*  MCV 92.4  PLT 98*   CBG:  Recent Labs Lab 12/30/15 0042  GLUCAP 103*   Hgb A1c: No results for input(s): HGBA1C in the last 72 hours. Lipid Profile:  Recent Labs  12/30/15 0508  CHOL 89  HDL 20*   LDLCALC 53  TRIG 79  CHOLHDL 4.5    Microbiology No results found for this or any previous visit (from the past 240 hour(s)).  Radiology: Ct Head Wo Contrast  Result Date: 12/30/2015 CLINICAL DATA:  82 year old male with left-sided weakness and slurred speech. Atrial fibrillation and hypertension. Initial encounter. EXAM: CT HEAD WITHOUT CONTRAST TECHNIQUE: Contiguous axial images were obtained from the base of the skull through the vertex without intravenous contrast. COMPARISON:  09/10/2012 head CT. FINDINGS: Brain: No intracranial hemorrhage. Moderate chronic microvascular changes without CT evidence of large acute infarct. No intracranial mass lesion noted on this unenhanced exam. Mild global atrophy without hydrocephalus. Vascular: Vascular calcifications. Skull: No acute abnormality.  No destructive lesion. Sinuses/Orbits: No acute orbital abnormality. Post lens replacement. Visualized paranasal sinuses, mastoid air cells and middle ear cavities are clear. Other: Negative IMPRESSION: Moderate chronic microvascular changes without CT evidence of large acute infarct. No intracranial hemorrhage. Mild global atrophy. Electronically Signed   By: Lacy Duverney M.D.   On: 12/30/2015 07:48  Medications:   . amoxicillin-clavulanate  1 tablet Oral Q12H  . ferrous sulfate  325 mg Oral Q breakfast  . folic acid  1 mg Oral Daily  . nitrofurantoin (macrocrystal-monohydrate)  100 mg Oral Q12H  . predniSONE  5 mg Oral Q breakfast  . rOPINIRole  2 mg Oral Daily  . simvastatin  20 mg Oral QHS   Continuous Infusions: . heparin 700 Units/hr (12/30/15 0052)    Medical decision making is of high complexity and this patient is at high risk of deterioration, therefore this is a level 3 visit.     LOS: 1 day   Shinichi Anguiano  [facility] Pager [phone]. If unable to reach me by pager, please call my cell phone at [phone].  *Please refer to [email or web address], password [email or web address] to get updated  schedule on who will round on this patient, as hospitalists switch teams weekly. If 7PM-7AM, please contact night-coverage at [email or web address], password [email or web address] for any overnight needs.  12/30/2015, 8:50 AM

## 2015-12-30 NOTE — Progress Notes (Signed)
ANTICOAGULATION CONSULT NOTE - Initial Consult  Pharmacy Consult for Heparin and Coumadin Indication: afib with bioprosthetic AVR  Allergies  Allergen Reactions  . Tape Other (See Comments)    Pulls skin off  . Vancomycin Other (See Comments)    Hearing loss    Patient Measurements:    Ht: 70 in  Wt 70 kg Heparin Dosing Weight: 70 kg  Vital Signs: Temp: 98 F (36.7 C) (10/21 0005) Temp Source: Oral (10/21 0005) BP: 131/70 (10/21 0005) Pulse Rate: 94 (10/20 2205)  Labs: No results for input(s): HGB, HCT, PLT, APTT, LABPROT, INR, HEPARINUNFRC, HEPRLOWMOCWT, CREATININE, CKTOTAL, CKMB, TROPONINI in the last 72 hours.  CrCl cannot be calculated (Unknown ideal weight.).   Medical History: Past Medical History:  Diagnosis Date  . AAA (abdominal aortic aneurysm) (HCC)    a. 06/2010 s/p Endovascular AAA Repair  . Anemia requiring transfusions    MOST RECENT WAS JUNE 2014 WITH HEART SURGERY  . Aortic stenosis    a. 08/2012 TEE: EF 45-55%, critical AS, Triv AI, mild to mod MR, Sev TR;  b. 08/2012 AVR (21mm Magna Ease pericardial tissue valve) & TV Repair (30mm MC3 annuloplasty ring).  . Arthritis    LEFT KNEE OA AND PAIN;  DJD LUMBAR  . Atrial fibrillation (HCC)   . Blood transfusion without reported diagnosis   . CAD (coronary artery disease)    a. 05/2004 CABGx4 (LIMA->LAD, VG->RI, VG->OM, VG->RCA);  b. 08/2012 Cath: Native 3VD with 3/4 patent grafts (VG->RI occluded).  . Cataract   . CHF (congestive heart failure) (HCC)   . 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 Complication of anesthesia    STATES HE DOES WELL WITH DIPROVAN  . Hyperlipidemia   . Hypertension   . Macular degeneration   . Pacemaker   . Pain    PT HAS A LOT OF LOWER BACK PAIN- PREVIOUS BACK SURGERY AND DJD  . Shortness of breath    MOSTLY WITH EXERTION - IMPROVED SINCE HEART VALVE REPLACEMENT    Medications:  Awaiting home med rec  Assessment: 82  y.o. M [facility].  Per family the "Picc line was source of infection and was removed" - discharged home with Augmentin and Macrobid. [facility] for r L sided weakness, aphasia [facility]. CT scan negative for acute abnormality. Pt on coumadin PTA for afib and h/o bioprosthetic AVR with goal INR 2-3. INR 2, Hgb 8.9, Hct 27.5, Plt 100 [facility]. Home dose of coumadin 5mg  daily except 7.5mg  on Wed - last taken 10/20.Plan per neuro (Dr. Roseanne RenoStewart) to start heparin with no bolus until INR 2.5-3 as likely embolic phenomena.   Goal of Therapy:  INR 2.5-3 per neuro; Heparin level 0.3-0.5 units/ml Monitor platelets by anticoagulation protocol: Yes   Plan:  Daily INR, heparin level and CBC Start heparin at 700 units/hr with no bolus F/u 8 hr heparin level Per Neuro, continue heparin until INR >/= 2.5  Christoper Fabianaron Naileah Karg, PharmD, BCPS Clinical pharmacist, pager [phone] 12/30/2015,12:19 AM

## 2015-12-30 NOTE — Evaluation (Signed)
Physical Therapy Evaluation Patient Details Name: Ethan LaniusCharles E Mumaw MRN: [id number] DOB: October 10, 1922 Today's Date: 12/30/2015   History of Present Illness  82 y.o.malewith medical history significant of Prosthetic aortic valve replacement, pacemaker for complete heart block. Pt presenting with acute onset of L sided weakness, aphasia. CT on 10/21 neg for acute infarct.  Clinical Impression  Patient demonstrates deficits in functional mobility as indicated below. Will need continued skilled PT to address deficits and maximize function. Will need continued post acute rehabilitation, recommend SNF upon acute discharge.     Follow Up Recommendations SNF;Supervision/Assistance - 24 hour    Equipment Recommendations   (TBD)    Recommendations for Other Services       Precautions / Restrictions Precautions Precautions: Fall Restrictions Weight Bearing Restrictions: No      Mobility  Bed Mobility Overal bed mobility: Needs Assistance Bed Mobility: Supine to Sit     Supine to sit: Min assist     General bed mobility comments: Vcs for positioning and cues to initiate to come to EOB  Transfers Overall transfer level: Needs assistance Equipment used:  (2 person face to face with gait belt and chuck pad) Transfers: Sit to/from Stand;Stand Pivot Transfers Sit to Stand: Max assist;+2 physical assistance Stand pivot transfers: Max assist;+2 physical assistance       General transfer comment: 2 person face to face with gait belt and chuck pad  Ambulation/Gait             General Gait Details: unable to perform  Stairs            Wheelchair Mobility    Modified Rankin (Stroke Patients Only) Modified Rankin (Stroke Patients Only) Pre-Morbid Rankin Score: Moderate disability Modified Rankin: Severe disability     Balance Overall balance assessment: History of Falls                                           Pertinent Vitals/Pain Pain  Assessment: Faces Faces Pain Scale: Hurts even more Pain Location: back Pain Descriptors / Indicators: Aching;Sore Pain Intervention(s): Limited activity within patient's tolerance;Repositioned;Relaxation    Home Living Family/patient expects to be discharged to:: Private residence Living Arrangements: Children Available Help at Discharge: Family Type of Home: House Home Access: Ramped entrance     Home Layout: One level   Additional Comments: pt lives alone but someone typically there with him. [place] [place] and daughter lives here. pt and dtrs house each have 3 steps to enter with hand rails.     Prior Function                 Hand Dominance   Dominant Hand: Right    Extremity/Trunk Assessment   Upper Extremity Assessment: Generalized weakness;Difficult to assess due to impaired cognition           Lower Extremity Assessment: Generalized weakness;LLE deficits/detail;Difficult to assess due to impaired cognition   LLE Deficits / Details: minimal active movement in LLE noted  Cervical / Trunk Assessment: Kyphotic  Communication   Communication: Expressive difficulties  Cognition Arousal/Alertness: Lethargic Behavior During Therapy: Flat affect Overall Cognitive Status: Impaired/Different from baseline Area of Impairment: Orientation;Attention;Memory;Following commands;Safety/judgement;Awareness;Problem solving Orientation Level: Disoriented to;Situation;Time Current Attention Level: Focused Memory: Decreased short-term memory Following Commands: Follows one step commands with increased time Safety/Judgement: Decreased awareness of safety;Decreased awareness of deficits Awareness: Intellectual Problem Solving: Slow processing;Decreased initiation;Difficulty  sequencing;Requires verbal cues;Requires tactile cues      General Comments      Exercises     Assessment/Plan    PT Assessment Patient needs continued PT services  PT Problem List Decreased  strength;Decreased activity tolerance;Decreased balance;Decreased mobility;Decreased coordination;Decreased cognition;Decreased safety awareness;Pain          PT Treatment Interventions DME instruction;Gait training;Functional mobility training;Therapeutic activities;Therapeutic exercise;Balance training;Neuromuscular re-education;Cognitive remediation;Patient/family education    PT Goals (Current goals can be found in the Care Plan section)  Acute Rehab PT Goals Patient Stated Goal: per daughter to get back to being very active PT Goal Formulation: With family Time For Goal Achievement: 01/13/16 Potential to Achieve Goals: Fair    Frequency Min 3X/week   Barriers to discharge        Co-evaluation PT/OT/SLP Co-Evaluation/Treatment: Yes Reason for Co-Treatment: Complexity of the patient's impairments (multi-system involvement);Necessary to address cognition/behavior during functional activity;For patient/therapist safety PT goals addressed during session: Mobility/safety with mobility OT goals addressed during session: ADL's and self-care       End of Session Equipment Utilized During Treatment: Gait belt Activity Tolerance: Patient limited by fatigue;Patient limited by lethargy Patient left: in chair;with call bell/phone within reach;with chair alarm set;with family/visitor present Nurse Communication: Mobility status;Precautions         Time: 1610-9604 PT Time Calculation (min) (ACUTE ONLY): 32 min   Charges:   PT Evaluation $PT Eval Moderate Complexity: 1 Procedure     PT G CodesFabio Asa 15-Jan-2016, 2:18 PM Charlotte Crumb, PT DPT  [phone]

## 2015-12-30 NOTE — Progress Notes (Signed)
VASCULAR LAB PRELIMINARY  PRELIMINARY  PRELIMINARY  PRELIMINARY  Right carotid duplex completed.    Preliminary report:  1-39% right ICA plaquing.  Bilateral vertebral artery flow is antegrade.  Unable to scan left carotid secondary to patient's inability to keep head turned.  Wyland Rastetter, RVT 12/30/2015, 9:51 AM

## 2015-12-30 NOTE — Progress Notes (Addendum)
ANTICOAGULATION CONSULT NOTE  Pharmacy Consult for Heparin Indication: afib with bioprosthetic AVR; likely new embolic event  Allergies  Allergen Reactions  . Tape Other (See Comments)    Pulls skin off  . Vancomycin Other (See Comments)    Hearing loss    Patient Measurements: Height: 6' (182.9 cm) Weight: 148 lb 5.9 oz (67.3 kg) IBW/kg (Calculated) : 77.6  Heparin Dosing Weight: 70 kg  Vital Signs: Temp: 98.5 F (36.9 C) (10/21 2005) Temp Source: Oral (10/21 2005) BP: 159/78 (10/21 2005) Pulse Rate: 86 (10/21 2005)  Labs:  Recent Labs  12/30/15 0508 12/30/15 1118 12/30/15 2211  HGB 8.3*  --   --   HCT 26.7*  --   --   PLT 98*  --   --   LABPROT 22.5*  --   --   INR 1.95  --   --   HEPARINUNFRC  --  <0.10* 0.15*    CrCl cannot be calculated ([name] most recent lab result is older than the maximum 21 days allowed.).  Assessment: 82 y.o. M on coumadin PTA for afib and h/o bioprosthetic AVR with goal INR 2-3. Neuro wants heparin bridge with new goal INR 2.5-3 as likely embolic phenomena.   PTA Warfarin Dose: 7.5mg  Wed and 5mg  AODs with last dose 10/20  Heparin level subtherapeutic (0.15) on gtt at 950 units/hr. No issues with line or bleeding reported per RN.  Goal of Therapy:  INR 2.5-3 per neuro; Heparin level 0.3-0.5 units/ml Monitor platelets by anticoagulation protocol: Yes   Plan:  Increase heparin to 1100 units/hr Will f/u 8 hr heparin level Per Neuro, continue heparin until INR >/= 2.5  Christoper Fabianaron Pama Roskos, PharmD, BCPS Clinical pharmacist, pager [phone] 12/30/2015,11:08 PM

## 2015-12-30 NOTE — Progress Notes (Signed)
Patient ID: Ethan Lawrence, male   DOB: 06-Dec-1922, 82 y.o.   MRN: [id number]                                                                PROGRESS NOTE                                                                                                                                                                                                             Patient Demographics:    Ethan Lawrence, is a 82 y.o. male, DOB - 1922/11/01, WUJ:[id number]  Admit date - 12/29/2015   Admitting Physician Maryruth Bun Rama, MD  Outpatient Primary MD for the patient is PROVIDER NOT IN SYSTEM  LOS - 1  Outpatient Specialists:    No chief complaint on file.      Brief Narrative 82 y.o. male with medical history significant of Prosthetic aortic valve replacement, pacemaker for complete heart block.  In December of 2016 he had S.Viridans bacteremia, TEE revealed a small mobile structure (vegetation vs suture) on the ring of the bioprosthetic aortic valve.  Patient treated with 6 weeks of rocephin.  He apparently had another bout of recurrent bacteremia in ~March or so (6 weeks ABX), and yet a 3rd for which he just finished up his ABx for about a month ago.  Despite finishing ABx his PICC line was left in.  And earlier this week he developed fever 103, and wasn't feeling well.  He was admitted earlier this week to [facility].  Per family the "Picc line was source of infection and was removed".  Patient was discharged on Augmentin and macrobid.  Today he developed L sided weakness, aphasia.  BGL initially low and was corrected but Aphasia persists.  He was transferred from [facility] to [facility] for neurology consultation.    Subjective:    Ethan Lawrence today has per his family some dyspnea.  Pt denies dyspnea, cough,  cp, palp, fever, chills, n/v, diarrhea.      Assessment  & Plan :    Principal Problem:   Acute ischemic stroke [facility]) Active Problems:  Chronic a-fib on Coumadin   CAD (coronary artery disease) s/p CABG   Essential hypertension   Chronic diastolic heart failure (HCC)   Long term current use of  anticoagulant therapy   Pacemaker   CRBSI (catheter-related bloodstream infection), subsequent encounter   History of endocarditis   Acute CVA (cerebrovascular accident) (HCC)   1.Dyspnea  Per family CXR pa and lateral ? Atelectasis vs pneumonia Levaquin pharmacy to dose Please [facility] to [facility] so can compare to see if old.     Code Status : FULL CODE  Family Communication  : w family, granddaughter  DVT Prophylaxis  :  Heparin  Lab Results  Component Value Date   PLT 98 (L) 12/30/2015    Antibiotics  :    Anti-infectives    Start     Dose/Rate Route Frequency Ordered Stop   12/29/15 2330  amoxicillin-clavulanate (AUGMENTIN) 875-125 MG per tablet 1 tablet     1 tablet Oral Every 12 hours 12/29/15 2244          Objective:   Vitals:   12/30/15 0600 12/30/15 1350 12/30/15 1811 12/30/15 2005  BP: 130/80 137/63 (!) 157/79 (!) 159/78  Pulse: 88 65 72 86  Resp:  20 18 18   Temp: 99.6 F (37.6 C)  99 F (37.2 C) 98.5 F (36.9 C)  TempSrc: Oral  Oral Oral  SpO2:  99% 97% 98%  Weight:      Height:        Wt Readings from Last 3 Encounters:  12/30/15 67.3 kg (148 lb 5.9 oz)  12/06/15 69.9 kg (154 lb)  05/17/15 71.7 kg (158 lb)     Intake/Output Summary (Last 24 hours) at 12/30/15 2147 Last data filed at 12/30/15 0131  Gross per 24 hour  Intake                0 ml  Output              200 ml  Net             -200 ml     Physical Exam  Awake Alert, Oriented X 3, No new F.N deficits, Normal affect [place].AT,PERRAL Supple Neck,No JVD, No cervical lymphadenopathy appriciated.  Symmetrical Chest wall movement, Good air movement bilaterally, slight crackle left lung base, no wheeze RRR,No Gallops,Rubs or new Murmurs, No Parasternal Heave +ve B.Sounds, Abd Soft, No tenderness, No  organomegaly appriciated, No rebound - guarding or rigidity. No Cyanosis, Clubbing or edema, No new Rash or bruise      Data Review:    CBC  Recent Labs Lab 12/30/15 0508  WBC 5.5  HGB 8.3*  HCT 26.7*  PLT 98*  MCV 92.4  MCH 28.7  MCHC 31.1  RDW 17.4*    Chemistries  No results for input(s): NA, K, CL, CO2, GLUCOSE, BUN, CREATININE, CALCIUM, MG, AST, ALT, ALKPHOS, BILITOT in the last 168 hours.  Invalid input(s): GFRCGP ------------------------------------------------------------------------------------------------------------------  Recent Labs  12/30/15 0508  CHOL 89  HDL 20*  LDLCALC 53  TRIG 79  CHOLHDL 4.5    Lab Results  Component Value Date   HGBA1C 5.7 (H) 08/27/2012   ------------------------------------------------------------------------------------------------------------------ No results for input(s): TSH, T4TOTAL, T3FREE, THYROIDAB in the last 72 hours.  Invalid input(s): FREET3 ------------------------------------------------------------------------------------------------------------------ No results for input(s): VITAMINB12, FOLATE, FERRITIN, TIBC, IRON, RETICCTPCT in the last 72 hours.  Coagulation profile  Recent Labs Lab 12/30/15 0508  INR 1.95    No results for input(s): DDIMER in the last 72 hours.  Cardiac Enzymes No results for input(s): CKMB, TROPONINI, MYOGLOBIN in the last 168 hours.  Invalid input(s): CK ------------------------------------------------------------------------------------------------------------------  Component Value Date/Time   BNP 279.6 (H) 02/21/2015 1615    Inpatient Medications  Scheduled Meds: . amoxicillin-clavulanate  1 tablet Oral Q12H  . ferrous sulfate  325 mg Oral Q breakfast  . folic acid  1 mg Oral Daily  . nitrofurantoin (macrocrystal-monohydrate)  100 mg Oral Q12H  . predniSONE  5 mg Oral Q breakfast  . rOPINIRole  2 mg Oral Daily  . simvastatin  20 mg Oral QHS  . Warfarin -  Pharmacist Dosing Inpatient   Does not apply q1800   Continuous Infusions: . heparin 950 Units/hr (12/30/15 1435)   PRN Meds:.acetaminophen, docusate sodium, HYDROcodone-acetaminophen  Micro Results No results found for this or any previous visit (from the past 240 hour(s)).  Radiology Reports Ct Head Wo Contrast  Result Date: 12/30/2015 CLINICAL DATA:  82 year old male with left-sided weakness and slurred speech. Atrial fibrillation and hypertension. Initial encounter. EXAM: CT HEAD WITHOUT CONTRAST TECHNIQUE: Contiguous axial images were obtained from the base of the skull through the vertex without intravenous contrast. COMPARISON:  09/10/2012 head CT. FINDINGS: Brain: No intracranial hemorrhage. Moderate chronic microvascular changes without CT evidence of large acute infarct. No intracranial mass lesion noted on this unenhanced exam. Mild global atrophy without hydrocephalus. Vascular: Vascular calcifications. Skull: No acute abnormality.  No destructive lesion. Sinuses/Orbits: No acute orbital abnormality. Post lens replacement. Visualized paranasal sinuses, mastoid air cells and middle ear cavities are clear. Other: Negative IMPRESSION: Moderate chronic microvascular changes without CT evidence of large acute infarct. No intracranial hemorrhage. Mild global atrophy. Electronically Signed   By: Lacy Duverney M.D.   On: 12/30/2015 07:48    Time Spent in minutes  30   Pearson Grippe M.D on 12/30/2015 at 9:47 PM  Between 7pm to 7am - Pager - [phone]

## 2015-12-30 NOTE — Progress Notes (Signed)
ANTICOAGULATION CONSULT NOTE  Pharmacy Consult for Heparin and Coumadin Indication: afib with bioprosthetic AVR  Allergies  Allergen Reactions  . Tape Other (See Comments)    Pulls skin off  . Vancomycin Other (See Comments)    Hearing loss    Patient Measurements: Height: 6' (182.9 cm) Weight: 148 lb 5.9 oz (67.3 kg) IBW/kg (Calculated) : 77.6  Heparin Dosing Weight: 70 kg  Vital Signs: Temp: 99.6 F (37.6 C) (10/21 0600) Temp Source: Oral (10/21 0600) BP: 130/80 (10/21 0600) Pulse Rate: 88 (10/21 0600)  Labs:  Recent Labs  12/30/15 0508 12/30/15 1118  HGB 8.3*  --   HCT 26.7*  --   PLT 98*  --   LABPROT 22.5*  --   INR 1.95  --   HEPARINUNFRC  --  <0.10*    CrCl cannot be calculated ([name] most recent lab result is older than the maximum 21 days allowed.).   Medical History: Past Medical History:  Diagnosis Date  . AAA (abdominal aortic aneurysm) (HCC)    a. 06/2010 s/p Endovascular AAA Repair  . Acute endocarditis 02/28/2015  . Anemia requiring transfusions    MOST RECENT WAS JUNE 2014 WITH HEART SURGERY  . Aortic stenosis    a. 08/2012 TEE: EF 45-55%, critical AS, Triv AI, mild to mod MR, Sev TR;  b. 08/2012 AVR (21mm Magna Ease pericardial tissue valve) & TV Repair (30mm MC3 annuloplasty ring).  . Arthritis    LEFT KNEE OA AND PAIN;  DJD LUMBAR  . Atrial fibrillation (HCC)   . Blood transfusion without reported diagnosis   . CAD (coronary artery disease)    a. 05/2004 CABGx4 (LIMA->LAD, VG->RI, VG->OM, VG->RCA);  b. 08/2012 Cath: Native 3VD with 3/4 patent grafts (VG->RI occluded).  . Cataract   . CHF (congestive heart failure) (HCC)   . 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 Complication of anesthesia    STATES HE DOES WELL WITH DIPROVAN  . GI bleed 06/18/2011  . Hyperlipidemia   . Hypertension   . Macular degeneration   . Pacemaker   . Pacemaker infection (HCC)   . Pain    PT HAS A LOT OF  LOWER BACK PAIN- PREVIOUS BACK SURGERY AND DJD  . Streptococcus viridans infection 02/28/2015    Medications:  Awaiting home med rec  Assessment: 82 y.o. M [facility].  Per family the "Picc line was source of infection and was removed" - discharged home with Augmentin and Macrobid. [facility] for r L sided weakness, aphasia [facility]. CT scan negative for acute abnormality. Pt on coumadin PTA for afib and h/o bioprosthetic AVR with goal INR 2-3. INR 2, Hgb 8.9, Hct 27.5, Plt 100 [facility]. Plan per neuro (Dr. Roseanne RenoStewart) to start heparin with no bolus until INR 2.5-3 as likely embolic phenomena.   PTA Warfarin Dose: 7.5mg  Wed and 5mg  AODs with last dose 10/20  Initial heparin level is undetectable on heparin 700 units/hr. Nurse reports no issues with infusion or bleeding.   Goal of Therapy:  INR 2.5-3 per neuro; Heparin level 0.3-0.5 units/ml Monitor platelets by anticoagulation protocol: Yes   Plan:  Increase heparin to 950 units/hr Warfarin 7.5mg  tonight x1 8h HL Daily INR, heparin level and CBC Per Neuro, continue heparin until INR >/= 2.5  Arlean Hoppingorey M. Newman PiesBall, PharmD,  BCPS Clinical Pharmacist Pager [phone] 12/30/2015,1:46 PM

## 2015-12-30 NOTE — Progress Notes (Signed)
Reassess pt at 0615, found to be more sleepy and trouble speaking. Vitals taken, O2 89, placed on 2L O2 [place]. Called rapid response, paged md on call and neurology on call. Dr. Roseanne RenoStewart called back and ordered STAT CT. Ester RN and Rapid response RN took pt to radiology. CT showed no bleed. Will continue to momitor.

## 2015-12-30 NOTE — Progress Notes (Signed)
Grand daughter called RN to come to room for "patient decline" (RN completed assessment 25 minutes prior, no new symptoms, per patient daughter, patient remains same all day). Triad paged and Neuro paged. CXR ordered, Triad to floor to assess. CCMD called, Dr. Selena BattenKim on floor aware of pace spike inside QRS. Continue monitoring patient.

## 2015-12-30 NOTE — Significant Event (Signed)
Called by Dr. Roseanne RenoStewart.  He recommends heparin gtt, "low dose, no bolus, until INR between 2.5-3".  Have therefore put in orders for this.

## 2015-12-30 NOTE — Progress Notes (Addendum)
STROKE TEAM PROGRESS NOTE   HISTORY OF PRESENT ILLNESS (per record) Ethan Lawrence is an 82 y.o. male history of atrial fibrillation on Coumadin, heart block with pacemaker placement, hypertension, hyperlipidemia, coronary artery disease, prosthetic heart valve, CHF and AAA, transferred from [facility] for management of strokelike symptoms. He has a history of bacteremia and heart valve vegetations and a similar 2016 as well as recurrence in March 2017. He had an acute febrile illness earlier this week which was thought to be due to an indwelling PICC line. PICC line was removed. He was discharged on Augmentin and Macrobid. He started experiencing difficulty with speech output this morning. Family members had difficulty understanding what he was saying. Patient knew what he wanted to say and could understand what others were saying to him. There was also a report of left extremity weakness. No facial droop was described. He had a CT scan of his head which showed no acute intracranial abnormality. MRI cannot be obtained because patient has a cardiac pacemaker. INR today was 2.0. He has continued to have intermittent episodes of expressive aphasia, including since arriving at [facility].  LSN: 12/28/2015 tPA Given: No: Therapeutic INR on Coumadin mRankin:   SUBJECTIVE (INTERVAL HISTORY) His grand-daughter was at the bedside.  Overall he is poorly responsive and we are unable to determine how he feels his condition is.  He is   gradually worsening to waxing and waning.  This AM he had a CT scan for change in exam which showed no acute findings.  He was poorly responsive and unable to complete ROS.  With RN at bedside.  He did say his name but remained lethargic   OBJECTIVE Temp:  [98 F (36.7 C)-99.7 F (37.6 C)] 99.6 F (37.6 C) (10/21 0600) Pulse Rate:  [49-94] 88 (10/21 0600) Cardiac Rhythm: Atrial fibrillation;Bundle branch block;Ventricular paced (10/21  0200) Resp:  [16-22] 22 (10/21 0406) BP: (104-133)/(51-93) 130/80 (10/21 0600) SpO2:  [97 %-99 %] 99 % (10/21 0406) FiO2 (%):  [0 %] 0 % (10/20 2306) Weight:  [67.3 kg (148 lb 5.9 oz)] 67.3 kg (148 lb 5.9 oz) (10/21 0005)  CBC:  Recent Labs Lab 12/30/15 0508  WBC 5.5  HGB 8.3*  HCT 26.7*  MCV 92.4  PLT 98*    Basic Metabolic Panel: No results for input(s): NA, K, CL, CO2, GLUCOSE, BUN, CREATININE, CALCIUM, MG, PHOS in the last 168 hours.  Lipid Panel:    Component Value Date/Time   CHOL 89 12/30/2015 0508   TRIG 79 12/30/2015 0508   HDL 20 (L) 12/30/2015 0508   CHOLHDL 4.5 12/30/2015 0508   VLDL 16 12/30/2015 0508   LDLCALC 53 12/30/2015 0508   HgbA1c:  Lab Results  Component Value Date   HGBA1C 5.7 (H) 08/27/2012   Urine Drug Screen: No results found for: LABOPIA, COCAINSCRNUR, LABBENZ, AMPHETMU, THCU, LABBARB    IMAGING  Ct Head Wo Contrast 12/30/2015 Moderate chronic microvascular changes without CT evidence of large acute infarct. No intracranial hemorrhage. Mild global atrophy.    PHYSICAL EXAM HEENT-  Normocephalic, no lesions, without obvious abnormality.  Normal external eye and conjunctiva.  Neck supple with no masses, nodes, nodules or enlargement. Cardiovascular - irregularly irregular rhythm and systolic murmur: holosystolic 3/6, crescendo throughout the precordium Lungs - chest clear, no wheezing, rales, normal symmetric air entry Abdomen - soft, non-tender; bowel sounds normal; no masses,  no organomegaly Extremities - no edema  Neurologic Examination: Mental Status: Lethargic.  Did not  answer orientation questions other than his name  Cranial Nerves: II-Visual fields were normal to threat III/IV/VI-Pupils were equal and reacted normally to light. Extraocular movements were intact.    V/VII-no gross facial weakness. VIII-unable to fully test X-limited exam due to lethargy. HY:[id number]:limited exam due to lethargy. XII-limited exam due to  lethargy.  Motor/Sensory: withdraws throughout Cerebellar: limited exam due to lethargy.  ASSESSMENT/PLAN [name] is a 82 y.o. male with history of atrial fibrillation on Coumadin (INR - 2.0), heart block with pacemaker placement, hypertension, hyperlipidemia, coronary artery disease, prosthetic heart valve, CHF, AAA, bacteremia with heart valve vegetations, recent acute febrile illness thought to be due to an indwelling PICC line -> PICC line was removed presenting with intermittent episodes of expressive aphasia and left extremity weakness  He did not receive IV t-PA due to anticoagulation.   Stroke vs TIA: Dominant - embolic atrial fibrillation or heart valve vegetations.  Resultant  Lethargy; dysarthria  MRI - pacemaker  MRA - pacemaker  Carotid Doppler - limited exam.  What was seen was Unremarkable but unable to scan left carotid secondary to patient's inability to keep head turned.  2D Echo - pending  LDL - 53  HgbA1c pending  VTE prophylaxis - IV heparin  Diet Heart Room service appropriate? Yes; Fluid consistency: Thin  warfarin daily prior to admission, now on heparin IV  Patient counseled to be compliant with his antithrombotic medications  Ongoing aggressive stroke risk factor management  Therapy recommendations: pending  Disposition:  pending  Hypertension  Occasionally low BPs but otherwise stable.  Permissive hypertension (OK if < 220/120) but gradually normalize in 5-7 days  Long-term BP goal normotensive  Hyperlipidemia  Home meds: Zocor 20 mg daily resumed in hospital  LDL 53, goal < 70  Continue statin at discharge   Other Stroke Risk Factors  Advanced age  Former cigarette smoker - quit 40 years ago.  Coronary artery disease   Other Active Problems  Anemia - 8.3 / 26.7  Thrombocytopenia - 98  ATTENDING NOTE: Patient was seen and examined by me personally. Documentation reflects findings. The laboratory and  radiographic studies reviewed by me. ROS pertinent positives could not be fully documented due to LOC  Condition:  Waxing and waning  Assessment and plan completed by me personally and fully documented above. Plans/Recommendations include:     Agree with current management; if there are positive blood cultures, there will be increased concern regarding bacterial emboli and the risk for bleeding with heparin.  Will monitor closely  Once ECHO reviewed; will consider if TEE will be required to more fully understand if there are cardiac reasons for patients abrupt onset of symptoms  Stroke work-up ongoing  Will follow  SIGNED BY: Dr. Alesia Bandahere Abhi Moccia   Hospital day # 1     To contact Stroke Continuity provider, please refer to [email or web address]. After hours, contact General Neurology

## 2015-12-30 NOTE — Consult Note (Signed)
Admission H&P    Chief Complaint: Transient episodes of speech output difficulty.  HPI: Ethan Lawrence is an 82 y.o. male history of atrial fibrillation on Coumadin, heart block with pacemaker placement, hypertension, hyperlipidemia, coronary artery disease, prosthetic heart valve, CHF and AAA, transferred [facility] for management of strokelike symptoms. He has a history of bacteremia and heart valve vegetations and a similar 2016 as well as recurrence in March 2017. He had an acute febrile illness earlier this week which was thought to be due to an indwelling PICC line. PICC line was removed. He was discharged on Augmentin and Macrobid. He started experiencing if occult he with speech output this morning. Family members had difficulty understanding what he was saying. Patient knew what he wanted to say and could understand what others were saying to him. There was also a report of left extremity weakness. No facial droop was described. He had a CT scan of his head which showed no acute intracranial abnormality. MRI cannot be obtained because patient has a cardiac pacemaker. INR today was 2.0. He has continued to have intermittent episodes of expressive aphasia, including since [facility].  LSN: 12/28/2015 tPA Given: No: Therapeutic INR on Coumadin mRankin:  Past Medical History:  Diagnosis Date  . AAA (abdominal aortic aneurysm) (HCC)    a. 06/2010 s/p Endovascular AAA Repair  . Anemia requiring transfusions    MOST RECENT WAS JUNE 2014 WITH HEART SURGERY  . Aortic stenosis    a. 08/2012 TEE: EF 45-55%, critical AS, Triv AI, mild to mod MR, Sev TR;  b. 08/2012 AVR (21mm Magna Ease pericardial tissue valve) & TV Repair (30mm MC3 annuloplasty ring).  . Arthritis    LEFT KNEE OA AND PAIN;  DJD LUMBAR  . Atrial fibrillation (HCC)   . Blood transfusion without reported diagnosis   . CAD (coronary artery disease)    a. 05/2004 CABGx4 (LIMA->LAD, VG->RI,  VG->OM, VG->RCA);  b. 08/2012 Cath: Native 3VD with 3/4 patent grafts (VG->RI occluded).  . Cataract   . CHF (congestive heart failure) (HCC)   . 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 Complication of anesthesia    STATES HE DOES WELL WITH DIPROVAN  . Hyperlipidemia   . Hypertension   . Macular degeneration   . Pacemaker   . Pain    PT HAS A LOT OF LOWER BACK PAIN- PREVIOUS BACK SURGERY AND DJD  . Shortness of breath    MOSTLY WITH EXERTION - IMPROVED SINCE HEART VALVE REPLACEMENT    Past Surgical History:  Procedure Laterality Date  . AORTIC VALVE REPLACEMENT N/A 09/01/2012   Procedure: REDO AORTIC VALVE REPLACEMENT (AVR);  Surgeon: Kerin PernaPeter Van Trigt, MD;  [facility];  Service: Open Heart Surgery;  Laterality: N/A;  . APPENDECTOMY    . BACK SURGERY    . CARDIAC CATHETERIZATION    . CARDIAC VALVE REPLACEMENT    . COLONOSCOPY  06/19/2011   Procedure: COLONOSCOPY;  Surgeon: Graylin ShiverSalem F Ganem, MD;  [facility];  Service: Gastroenterology;  Laterality: N/A;  . CORONARY ARTERY BYPASS GRAFT    . ENDOVASCULAR STENT INSERTION  07/05/2010  . ESOPHAGOGASTRODUODENOSCOPY  06/19/2011   Procedure: ESOPHAGOGASTRODUODENOSCOPY (EGD);  Surgeon: Graylin ShiverSalem F Ganem, MD;  [facility];  Service: Gastroenterology;  Laterality: N/A;  . EYE SURGERY     bilater cataracts removed  . HERNIA REPAIR    . INTRAOPERATIVE TRANSESOPHAGEAL ECHOCARDIOGRAM N/A  09/01/2012   Procedure: INTRAOPERATIVE TRANSESOPHAGEAL ECHOCARDIOGRAM;  Surgeon: Kerin Perna, MD;  [facility] OR;  Service: Open Heart Surgery;  Laterality: N/A;  . JOINT REPLACEMENT    . PACEMAKER INSERTION  09/07/2012   Medtronic Sensia single chamber pacemaker implanted by Dr Ladona Ridgel   . PERMANENT PACEMAKER INSERTION N/A 09/07/2012   Procedure: PERMANENT PACEMAKER INSERTION;  Surgeon: Marinus Maw, MD;  Location: [facility] CATH LAB;  Service: Cardiovascular;  Laterality: N/A;  . PROSTATE SURGERY    .  SPINE SURGERY    . SURGERY ABOUT 50 YRS AGO FOR PERFORATED ULCER AND REPAIR OF HIATAL HERNIA    . TEE WITHOUT CARDIOVERSION N/A 02/27/2015   Procedure: TRANSESOPHAGEAL ECHOCARDIOGRAM (TEE);  Surgeon: Chilton Si, MD;  Location: [facility] ENDOSCOPY;  Service: Cardiovascular;  Laterality: N/A;  . TOTAL KNEE ARTHROPLASTY Left 02/15/2013   Procedure: LEFT TOTAL KNEE ARTHROPLASTY;  Surgeon: Shelda Pal, MD;  Location: [facility] ORS;  Service: Orthopedics;  Laterality: Left;  . TRICUSPID VALVE REPLACEMENT N/A 09/01/2012   Procedure: TRICUSPID VALVE REPAIR;  Surgeon: Kerin Perna, MD;  Location[facility] OR;  Service: Open Heart Surgery;  Laterality: N/A;  . VASCULAR SURGERY      Family History  Problem Relation Age of Onset  . Coronary artery disease Brother   . Healthy Mother   . Emphysema Father   . Healthy Sister   . Alzheimer's disease Brother    Social History:  reports that he quit smoking about 40 years ago. He has never used smokeless tobacco. He reports that he does not drink alcohol or use drugs.  Allergies:  Allergies  Allergen Reactions  . Tape Other (See Comments)    Pulls skin off  . Vancomycin Other (See Comments)    Hearing loss    Medications Prior to Admission  Medication Sig Dispense Refill  . acetaminophen (TYLENOL) 325 MG tablet Take 650 mg by mouth. Pt. receives Norco PRN.  Do not exceed 4000 mg of Tylenol in 24 hours    . folic acid (FOLVITE) 1 MG tablet Take 1 mg by mouth daily.     . furosemide (LASIX) 20 MG tablet Take 20 mg by mouth daily. Prn with feet swelling    . gabapentin (NEURONTIN) 300 MG capsule Take 300 mg by mouth at bedtime.    Marland Kitchen HYDROcodone-acetaminophen (NORCO) 10-325 MG tablet Take 1 tablet by mouth every 4 (four) hours as needed for moderate pain.    . methocarbamol (ROBAXIN) 500 MG tablet Take 500 mg by mouth every 6 (six) hours as needed for muscle spasms.    . Multiple Vitamins-Minerals (PRESERVISION AREDS 2 PO) Take 1 capsule by mouth 2 (two) times  daily.    Marland Kitchen omeprazole (PRILOSEC) 20 MG capsule Take 20 mg by mouth daily.    . predniSONE (DELTASONE) 5 MG tablet Take 5 mg by mouth daily with breakfast.    . Protein (PROCEL PO) Take 1 scoop by mouth 2 (two) times daily.    Marland Kitchen rOPINIRole (REQUIP) 1 MG tablet Take 1 mg by mouth daily.    . simvastatin (ZOCOR) 20 MG tablet Take 20 mg by mouth at bedtime.     . traMADol (ULTRAM) 50 MG tablet Take 25 mg by mouth daily.    Marland Kitchen warfarin (COUMADIN) 5 MG tablet Take 5 mg by mouth daily.    . [DISCONTINUED] HYDROcodone-acetaminophen (NORCO) 10-325 MG tablet Take 1 tablet by mouth at bedtime.      ROS: History obtained from chart review and  the patient  General ROS: As noted in present illness Psychological ROS: negative for - behavioral disorder, hallucinations, memory difficulties, mood swings or suicidal ideation Ophthalmic ROS: negative for - blurry vision, double vision, eye pain or loss of vision ENT ROS: negative for - epistaxis, nasal discharge, oral lesions, sore throat, tinnitus or vertigo Allergy and Immunology ROS: negative for - hives or itchy/watery eyes Hematological and Lymphatic ROS: negative for - bleeding problems, bruising or swollen lymph nodes Endocrine ROS: negative for - galactorrhea, hair pattern changes, polydipsia/polyuria or temperature intolerance Respiratory ROS: negative for - cough, hemoptysis, shortness of breath or wheezing Cardiovascular ROS: As noted in present illness Gastrointestinal ROS: negative for - abdominal pain, diarrhea, hematemesis, nausea/vomiting or stool incontinence Genito-Urinary ROS: negative for - dysuria, hematuria, incontinence or urinary frequency/urgency Musculoskeletal ROS: negative for - joint swelling or muscular weakness Neurological ROS: as noted in HPI Dermatological ROS: negative for rash and skin lesion changes  Physical Examination: Blood pressure (!) 133/93, pulse 94, temperature 99.5 F (37.5 C), resp. rate 20, SpO2 97  %.  HEENT-  Normocephalic, no lesions, without obvious abnormality.  Normal external eye and conjunctiva.  Normal TM's bilaterally.  Normal auditory canals and external ears. Normal external nose, mucus membranes and septum.  Normal pharynx. Neck supple with no masses, nodes, nodules or enlargement. Cardiovascular - irregularly irregular rhythm and systolic murmur: holosystolic 3/6, crescendo throughout the precordium Lungs - chest clear, no wheezing, rales, normal symmetric air entry Abdomen - soft, non-tender; bowel sounds normal; no masses,  no organomegaly Extremities - no edema  Neurologic Examination: Mental Status: Alert, oriented, no acute distress. Intermittent expressive aphasia with word finding difficulty. Able to follow commands without difficulty. Cranial Nerves: II-Visual fields were normal. III/IV/VI-Pupils were equal and reacted normally to light. Extraocular movements were full and conjugate.    V/VII-no facial numbness and no facial weakness. VIII-normal. X-no dysarthria; symmetrical palatal movement. XI: trapezius strength/neck flexion strength normal bilaterally XII-midline tongue extension with normal strength. Motor: 5/5 bilaterally with normal tone and bulk Sensory: Normal throughout. Deep Tendon Reflexes: 1+ and symmetric. Plantars: Mute bilaterally Cerebellar: Normal finger-to-nose testing. Carotid auscultation: Normal  No results found for this or any previous visit (from the past 48 hour(s)). No results found.  Assessment: 82 y.o. male presenting with probable recurrent TIAs with intermittent expressive aphasia, likely embolic phenomena.  Stroke Risk Factors - atrial fibrillation, hyperlipidemia, hypertension and prosthetic valve  Plan: 1. HgbA1c, fasting lipid panel 2. CT angiogram of head and neck with contrast 3. PT consult, OT consult, Speech consult 4. Echocardiogram 5. Prophylactic therapy-Anticoagulation: Coumadin, with IV heparin, low-dose no  bolus, until INR is greater than 2.5 6. Risk factor modification 7. Telemetry monitoring  C.R. Roseanne Reno, MD [facility] [phone]  12/30/2015, 12:07 AM

## 2015-12-31 ENCOUNTER — Inpatient Hospital Stay (HOSPITAL_COMMUNITY): Payer: Medicare PPO

## 2015-12-31 DIAGNOSIS — R400 Somnolence: Secondary | ICD-10-CM

## 2015-12-31 DIAGNOSIS — R471 Dysarthria and anarthria: Secondary | ICD-10-CM

## 2015-12-31 DIAGNOSIS — E785 Hyperlipidemia, unspecified: Secondary | ICD-10-CM

## 2015-12-31 DIAGNOSIS — M25562 Pain in left knee: Secondary | ICD-10-CM | POA: Diagnosis present

## 2015-12-31 DIAGNOSIS — R5383 Other fatigue: Secondary | ICD-10-CM

## 2015-12-31 LAB — HEPARIN LEVEL (UNFRACTIONATED)
HEPARIN UNFRACTIONATED: 0.25 [IU]/mL — AB (ref 0.30–0.70)
HEPARIN UNFRACTIONATED: 0.34 [IU]/mL (ref 0.30–0.70)

## 2015-12-31 LAB — CBC
HCT: 28.3 % — ABNORMAL LOW (ref 39.0–52.0)
Hemoglobin: 8.8 g/dL — ABNORMAL LOW (ref 13.0–17.0)
MCH: 28.9 pg (ref 26.0–34.0)
MCHC: 31.1 g/dL (ref 30.0–36.0)
MCV: 92.8 fL (ref 78.0–100.0)
PLATELETS: 94 10*3/uL — AB (ref 150–400)
RBC: 3.05 MIL/uL — AB (ref 4.22–5.81)
RDW: 17.2 % — ABNORMAL HIGH (ref 11.5–15.5)
WBC: 4.8 10*3/uL (ref 4.0–10.5)

## 2015-12-31 LAB — BASIC METABOLIC PANEL
ANION GAP: 10 (ref 5–15)
BUN: 14 mg/dL (ref 6–20)
CO2: 22 mmol/L (ref 22–32)
Calcium: 8.5 mg/dL — ABNORMAL LOW (ref 8.9–10.3)
Chloride: 104 mmol/L (ref 101–111)
Creatinine, Ser: 1.29 mg/dL — ABNORMAL HIGH (ref 0.61–1.24)
GFR, EST AFRICAN AMERICAN: 54 mL/min — AB (ref >60)
GFR, EST NON AFRICAN AMERICAN: 47 mL/min — AB (ref >60)
GLUCOSE: 99 mg/dL (ref 65–99)
POTASSIUM: 3.5 mmol/L (ref 3.5–5.1)
Sodium: 136 mmol/L (ref 135–145)

## 2015-12-31 LAB — PROTIME-INR
INR: 1.75
PROTHROMBIN TIME: 20.7 s — AB (ref 11.4–15.2)

## 2015-12-31 MED ORDER — CEFTAROLINE FOSAMIL 400 MG IV SOLR
400.0000 mg | Freq: Three times a day (TID) | INTRAVENOUS | Status: DC
  Filled 2015-12-31: qty 400

## 2015-12-31 MED ORDER — WARFARIN SODIUM 7.5 MG PO TABS
7.5000 mg | ORAL_TABLET | Freq: Once | ORAL | Status: AC
  Administered 2015-12-31: 7.5 mg via ORAL
  Filled 2015-12-31: qty 1

## 2015-12-31 MED ORDER — SODIUM CHLORIDE 0.9 % IV SOLN
400.0000 mg | Freq: Three times a day (TID) | INTRAVENOUS | Status: DC
  Filled 2015-12-31 (×3): qty 400

## 2015-12-31 MED ORDER — SODIUM CHLORIDE 0.9 % IV SOLN
400.0000 mg | Freq: Two times a day (BID) | INTRAVENOUS | Status: DC
  Administered 2015-12-31 – 2016-01-01 (×3): 400 mg via INTRAVENOUS
  Filled 2015-12-31 (×4): qty 400

## 2015-12-31 NOTE — Progress Notes (Addendum)
STROKE TEAM PROGRESS NOTE   HISTORY OF PRESENT ILLNESS (per record) Ethan Lawrence is an 82 y.o. male history of atrial fibrillation on Coumadin, heart block with pacemaker placement, hypertension, hyperlipidemia, coronary artery disease, prosthetic heart valve, CHF and AAA, transferred from [facility] for management of strokelike symptoms. He has a history of bacteremia and heart valve vegetations and a similar 2016 as well as recurrence in March 2017. He had an acute febrile illness earlier this week which was thought to be due to an indwelling PICC line. PICC line was removed. He was discharged on Augmentin and Macrobid. He started experiencing difficulty with speech output this morning. Family members had difficulty understanding what he was saying. Patient knew what he wanted to say and could understand what others were saying to him. There was also a report of left extremity weakness. No facial droop was described. He had a CT scan of his head which showed no acute intracranial abnormality. MRI cannot be obtained because patient has a cardiac pacemaker. INR today was 2.0. He has continued to have intermittent episodes of expressive aphasia, including since arriving at [facility].  LSN: 12/28/2015 tPA Given: No: Therapeutic INR on Coumadin mRankin:   SUBJECTIVE (INTERVAL HISTORY) His grand-daughter and daughter were at the bedside.  Overall he is much more responsive and able to follow commands and interact with the examiner and family.  Today, daughter was at the bedside and offered to show me the video taken when event first occurred. I watched the video.  Patient is seen with eye open and mute.  He appears slightly restless and never responds to his daughter when she asks questions.  He appears to touch/pat his head and pick at clothing.    Today is ROS was easily completed and his pertinent positive was left knee pain   OBJECTIVE Temp:  [98.1 F (36.7 C)-99  F (37.2 C)] 98.1 F (36.7 C) (10/22 0119) Pulse Rate:  [65-91] 91 (10/22 0119) Cardiac Rhythm: Atrial fibrillation;Ventricular paced (10/21 2346) Resp:  [18-20] 18 (10/22 0119) BP: (134-159)/(63-79) 134/79 (10/22 0119) SpO2:  [97 %-99 %] 99 % (10/22 0119)  CBC:   Recent Labs Lab 12/30/15 0508 12/31/15 0246  WBC 5.5 4.8  HGB 8.3* 8.8*  HCT 26.7* 28.3*  MCV 92.4 92.8  PLT 98* 94*    Basic Metabolic Panel:   Recent Labs Lab 12/31/15 0246  NA 136  K 3.5  CL 104  CO2 22  GLUCOSE 99  BUN 14  CREATININE 1.29*  CALCIUM 8.5*    Lipid Panel:     Component Value Date/Time   CHOL 89 12/30/2015 0508   TRIG 79 12/30/2015 0508   HDL 20 (L) 12/30/2015 0508   CHOLHDL 4.5 12/30/2015 0508   VLDL 16 12/30/2015 0508   LDLCALC 53 12/30/2015 0508   HgbA1c:  Lab Results  Component Value Date   HGBA1C 5.7 (H) 08/27/2012   Urine Drug Screen: No results found for: LABOPIA, COCAINSCRNUR, LABBENZ, AMPHETMU, THCU, LABBARB    IMAGING  Ct Head Wo Contrast 12/30/2015 Moderate chronic microvascular changes without CT evidence of large acute infarct. No intracranial hemorrhage. Mild global atrophy.   DG Chest 2 View 12/30/2015 1. Cardiomegaly. 2. Left lower lobe atelectasis or early infiltrate.  Echo 12/30/2015 Study Conclusions - Left ventricle: The cavity size was normal. Wall thickness was   increased in a pattern of moderate LVH. Systolic function was   mildly to moderately reduced. The estimated ejection fraction was  in the range of 40% to 45%. - Aortic valve: There was moderate stenosis. Valve area (VTI): 1.19   cm^2. Valve area (Vmax): 1.15 cm^2. Valve area (Vmean): 1.11   cm^2. - Mitral valve: Moderately calcified annulus. There was moderate to   severe regurgitation. - Left atrium: The atrium was massively dilated. - Right atrium: The atrium was moderately dilated.   PHYSICAL EXAM HEENT-  Normocephalic, no lesions, without obvious abnormality.  Normal  external eye and conjunctiva.  Neck supple with no masses, nodes, nodules or enlargement. Cardiovascular - irregularly irregular rhythm and systolic murmur: holosystolic 3/6, crescendo throughout the precordium Lungs - chest clear, no wheezing, rales, normal symmetric air entry Abdomen - soft, non-tender; bowel sounds normal; no masses,  no organomegaly Extremities - no edema  Neurologic Examination: Mental Status: Awake and alert.  He knew his name, the name of the hospital, he struggled with the month and remembered with some cuing; he di not get the date or year.  He offered his birth date which is later this month.  When asked the year he was born, he did not answer correctly.  He did easily name the president  Cranial Nerves: II-Visual fields were normal to threat III/IV/VI-Pupils were equal and reacted normally to light. Extraocular movements were intact.    V/VII-no gross facial weakness. VIII- grossly decreased hearing X- able to cough and swallow without difficult XI: shrug symmetric XII-tongue midline   Motor/Sensory: 5/5 strength in his upper extremities; left leg exam limited by marked pain in the left knee; right leg 4/5 Cerebellar: finger to nose intact  ASSESSMENT/PLAN [name] is a 82 y.o. male with history of atrial fibrillation on Coumadin (INR - 2.0), heart block with pacemaker placement, hypertension, hyperlipidemia, coronary artery disease, prosthetic heart valve, CHF, AAA, bacteremia with heart valve vegetations, recent acute febrile illness thought to be due to an indwelling PICC line -> PICC line was removed presenting with intermittent episodes of expressive aphasia and left extremity weakness  He did not receive IV t-PA due to anticoagulation.    TIA versus seizure: Dominant -? Hemisphere given speech arrest  Resultant  Lethargy; dysarthria  MRI - pacemaker  MRA - pacemaker  Carotid Doppler - limited exam.  What was seen was Unremarkable  but unable to scan left carotid secondary to patient's inability to keep head turned.  2D Echo - EF - 40% to 45%. AS / TR ; Left atrium - massively dilated. No cardiac source of emboli identified.  LDL - 53  HgbA1c pending  VTE prophylaxis - IV heparin Diet Heart Room service appropriate? Yes; Fluid consistency: Thin  warfarin daily prior to admission, now on heparin IV  Patient counseled to be compliant with his antithrombotic medications  Ongoing aggressive stroke risk factor management  Therapy recommendations: SNF recommended  Disposition:  pending  Hypertension  Occasionally low BPs but otherwise stable.  Permissive hypertension (OK if < 220/120) but gradually normalize in 5-7 days  Long-term BP goal normotensive  Hyperlipidemia  Home meds: Zocor 20 mg daily resumed in hospital  LDL 53, goal < 70  Continue statin at discharge  Other Stroke Risk Factors  Advanced age  Former cigarette smoker - quit 40 years ago.  Coronary artery disease   Other Active Problems  Anemia - 8.3 / 26.7 -> 8.8 / 28.3  Thrombocytopenia - 98 -> 94  ATTENDING NOTE: Patient was seen and examined by me personally. Documentation reflects findings. The laboratory and radiographic studies reviewed by  me. ROS pertinent positives could not be fully documented due to LOC  Condition:  Waxing and waning  Assessment and plan completed by me personally and fully documented above. Plans/Recommendations include:     Patient is significantly better today.  Only change overnight was the start of Cipro.  Primary team will consult ID  Admission CT without stroke.  Question of a TIA was raised given sudden onset of symptoms; this was concerning given previous history of endocarditis;  bacterial emboli could place the patient at increased risk for bleeding with heparin.  Will monitor closely.  I would strongly consider starting a continuous EEG if the patient demonstrates these symptoms again.   This would suggest complex partial seizure as an explanation for the behavior seen on the video the daughter provided.  ECHO markedly abnormal.  However, no vegetations described.  Discussed case with Dr. Darnelle Catalanama.  No further recommendations for now.  Will sign off  SIGNED BY: Dr. Alesia Bandahere Jaymen Fetch   Hospital day # 2   To contact Stroke Continuity provider, please refer to [email or web address]. After hours, contact General Neurology

## 2015-12-31 NOTE — Plan of Care (Signed)
Problem: Tissue Perfusion: Goal: Cerebral tissue perfusion will improve (applicable to all stroke diagnoses) Outcome: Progressing Ethan Lawrence alert today. heparin iv infusion at 12.245ml/hr.

## 2015-12-31 NOTE — Progress Notes (Signed)
Pharmacy called, keeping hep drip at 12.695ml/hr at this time.

## 2015-12-31 NOTE — Progress Notes (Signed)
ANTICOAGULATION CONSULT NOTE  Pharmacy Consult for Heparin and Warfarin Indication: afib with bioprosthetic AVR; likely new embolic event  Assessment: 82 y.o. M on warfarin PTA for afib and h/o bioprosthetic AVR with goal INR 2-3. Neuro wants heparin bridge with new goal INR 2.5-3 as likely embolic phenomena.   PTA Warfarin Dose: 7.5mg  Wed and 5mg  AODs with last dose 10/20 (INR on admission = 2)  Heparin level = 0.34 on IV heparin drip 1250 units/hr. Level is therapeutic with goal =0.3-0.5 units/ml. RN reported earlier that patient was having nosebleeds, but that they are easy to control. No report of increase or new bleeding  Goal of Therapy:  INR 2.5-3 per neuro; Heparin level 0.3-0.5 units/ml Monitor platelets by anticoagulation protocol: Yes   Plan:  Continue IV heparin drip at 1250 units/hr Will f/u AM heparin level. Monitor s/sx's of bleeding Monitor daily INR, heparin level, and CBC Per Neuro, continue heparin until INR >/= 2.5   Allergies  Allergen Reactions  . Tape Other (See Comments)    Pulls skin off  . Vancomycin Other (See Comments)    Hearing loss    Patient Measurements: Height: 6' (182.9 cm) Weight: 148 lb 5.9 oz (67.3 kg) IBW/kg (Calculated) : 77.6  Heparin Dosing Weight: 70 kg  Vital Signs: Temp: 97.5 F (36.4 C) (10/22 1428) Temp Source: Oral (10/22 1428) BP: 153/99 (10/22 1428) Pulse Rate: 96 (10/22 1428)  Labs:  Recent Labs  12/30/15 0508  12/30/15 2211 12/31/15 0246 12/31/15 0840 12/31/15 1920  HGB 8.3*  --   --  8.8*  --   --   HCT 26.7*  --   --  28.3*  --   --   PLT 98*  --   --  94*  --   --   LABPROT 22.5*  --   --  20.7*  --   --   INR 1.95  --   --  1.75  --   --   HEPARINUNFRC  --   < > 0.15*  --  0.25* 0.34  CREATININE  --   --   --  1.29*  --   --   < > = values in this interval not displayed.  Estimated Creatinine Clearance: 35.5 mL/min (by C-G formula based on SCr of 1.29 mg/dL (H)).   Allie BossierApryl Anderson, PharmD PGY1  Pharmacy Resident [phone] (Pager) 12/31/2015 8:13 PM

## 2015-12-31 NOTE — Progress Notes (Signed)
ANTICOAGULATION CONSULT NOTE  Pharmacy Consult for Heparin and Warfarin Indication: afib with bioprosthetic AVR; likely new embolic event  Assessment: 82 y.o. M on warfarin PTA for afib and h/o bioprosthetic AVR with goal INR 2-3. Neuro wants heparin bridge with new goal INR 2.5-3 as likely embolic phenomena.   PTA Warfarin Dose: 7.5mg  Wed and 5mg  AODs with last dose 10/20 (INR on admission = 2)  Heparin level subtherapeutic (0.25) on gtt at 1100 units/hr. Hemoglobin and platelets low, but stable. No issues with line or pauses in therapy per RN. INR subtherapeutic at 1.75 after 7.5mg  dose last night. RN reports patient is having nosebleeds, but that they are easy to control.  Goal of Therapy:  INR 2.5-3 per neuro; Heparin level 0.3-0.5 units/ml Monitor platelets by anticoagulation protocol: Yes   Plan:  Increase heparin to 1250 units/hr Warfarin 7.5 mg x 1 dose tonight Will f/u 8 hr heparin level Monitor s/sx's of bleeding Monitor daily INR, heparin level, and CBC Per Neuro, continue heparin until INR >/= 2.5   Allergies  Allergen Reactions  . Tape Other (See Comments)    Pulls skin off  . Vancomycin Other (See Comments)    Hearing loss    Patient Measurements: Height: 6' (182.9 cm) Weight: 148 lb 5.9 oz (67.3 kg) IBW/kg (Calculated) : 77.6  Heparin Dosing Weight: 70 kg  Vital Signs: Temp: 98.1 F (36.7 C) (10/22 0119) Temp Source: Oral (10/22 0119) BP: 134/79 (10/22 0119) Pulse Rate: 91 (10/22 0119)  Labs:  Recent Labs  12/30/15 0508 12/30/15 1118 12/30/15 2211 12/31/15 0246 12/31/15 0840  HGB 8.3*  --   --  8.8*  --   HCT 26.7*  --   --  28.3*  --   PLT 98*  --   --  94*  --   LABPROT 22.5*  --   --  20.7*  --   INR 1.95  --   --  1.75  --   HEPARINUNFRC  --  <0.10* 0.15*  --  0.25*  CREATININE  --   --   --  1.29*  --     Estimated Creatinine Clearance: 35.5 mL/min (by C-G formula based on SCr of 1.29 mg/dL (H)).   Allie BossierApryl Arshawn Valdez, PharmD PGY1  Pharmacy Resident [phone] (Pager) 12/31/2015 9:34 AM

## 2015-12-31 NOTE — Progress Notes (Addendum)
Progress Note    LEANDREW Lawrence  EGB:[id number] DOB: May 23, 1922  DOA: 12/29/2015 PCP: PROVIDER NOT IN SYSTEM    Brief Narrative:   Chief complaint: Follow-up speech difficulty  Ethan Lawrence is an 82 y.o. male with a PMH of atrial fibrillation on Coumadin, heart block with pacemaker placement, hypertension, hyperlipidemia, coronary artery disease, prosthetic heart valve, CHF and AAA, transferred from [facility] for management of strokelike symptoms on 12/29/15. He has a history of bacteremia and heart valve vegetations in 2016 as well as recurrence in March 2017. He had an acute febrile illness earlier this week which was thought to be due to an indwelling PICC line. PICC line was removed. He had a CT scan of his head which showed no acute intracranial abnormality. MRI cannot be obtained because patient has a cardiac pacemaker. INR was 2.0 on admission.  Assessment/Plan:   Principal Problem:   Acute ischemic stroke [facility]) Neurology consulted on admission. Full stroke workup initiated. Continue telemetry. PT/OT/ST consults requested. LDL at goal at 53, continue Zocor. Follow-up hemoglobin A1c. CT scan obtained which showed moderate chronic microvascular changes without evidence of a large acute infarct. Mild global atrophy. 2-D echo showed an EF of 40-45 percent, moderate aortic stenosis, no source of embolism. Right carotid duplex showed mild disease. Unable to scan left secondary to the patient's inability to keep his head turned.May need TEE to rule out murantic emboli. Will discuss with Dr. Lovena Le and Dr. Tamala Julian tomorrow.   Active Problems:   Left knee pain H/O TKR.  Joint now swollen, warm compared to right knee.  Will get plain films.  Check ESR.    Dyspnea Developed dyspnea overnight. Chest x-ray showed a left lower lobe early infiltrate versus atelectasis. Afebrile.    Coronary artery disease status post CABG Continue statin.  Anticoagulated.    Chronic diastolic CHF, NYHA class II Compensated at this time.    History of complete heart block status post pacemaker Records reviewed, followed by Dr. Lovena Le who reports his medtronic device is programmed VVIR. 9 years on the battery.    Aortic stenosis status post aortic valve replacement Goal INR 2.5.    Chronic a-fib on Coumadin Currently on IV heparin/Coumadin until INR 2.5.    Essential hypertension Not currently on antihypertensive therapies. Blood pressure not elevated.    CRBSI (catheter-related bloodstream infection), subsequent encounter/history of bacterial endocarditis Continue Augmentin. Monitor for fevers. If he has recurrent evidence of infection, may need to have his pacemaker removed. Spoke with Dr. Wendie Agreste about this patient and need for ID consultation.  He recommended changing his antibiotics to Teflaro and obtaining a TEE.     Family Communication/Anticipated D/C date and plan/Code Status   DVT prophylaxis: Heparin/Coumadin ordered. Code Status: Full Code.  Family Communication: Granddaughter and daughter at bedside. Disposition Plan: Home when work up completed, plan of care fully defined.   Medical Consultants:    Neurology   Procedures:   2-D echo 12/30/15:  Study Conclusions  - Left ventricle: The cavity size was normal. Wall thickness was   increased in a pattern of moderate LVH. Systolic function was   mildly to moderately reduced. The estimated ejection fraction was   in the range of 40% to 45%. - Aortic valve: There was moderate stenosis. Valve area (VTI): 1.19   cm^2. Valve area (Vmax): 1.15 cm^2. Valve area (Vmean): 1.11   cm^2. - Mitral valve: Moderately calcified annulus. There was moderate to  severe regurgitation. - Left atrium: The atrium was massively dilated. - Right atrium: The atrium was moderately dilated.  Right carotid duplex 12/30/15: Preliminary report:  1-39% right ICA plaquing.  Bilateral  vertebral artery flow is antegrade.  Unable to scan left carotid secondary to patient's inability to keep head turned.  Anti-Infectives:    None  Subjective:   The patient is completely back to baseline today.  His speech is fluent and he is following commands.  Denies dyspnea/cough.  No fever/chills. No chest pain. Remembers my interactions with him yesterday.  Objective:    Vitals:   12/30/15 1350 12/30/15 1811 12/30/15 2005 12/31/15 0119  BP: 137/63 (!) 157/79 (!) 159/78 134/79  Pulse: 65 72 86 91  Resp: 20 18 18 18   Temp:  99 F (37.2 C) 98.5 F (36.9 C) 98.1 F (36.7 C)  TempSrc:  Oral Oral Oral  SpO2: 99% 97% 98% 99%  Weight:      Height:        Intake/Output Summary (Last 24 hours) at 12/31/15 0842 Last data filed at 12/31/15 0600  Gross per 24 hour  Intake            252.7 ml  Output                0 ml  Net            252.7 ml   Filed Weights   12/30/15 0005  Weight: 67.3 kg (148 lb 5.9 oz)    Exam: General exam: No distress, sitting up in chair. Respiratory system: Clear to auscultation. Respiratory effort normal. Cardiovascular system: HSIR, RRR. No JVD,  rubs, gallops or clicks. III/VI murmurs. Gastrointestinal system: Abdomen is nondistended, soft and nontender. No organomegaly or masses felt. Normal bowel sounds heard. Central nervous system: Alert and oriented x 2. Speech clear/coherent/fluent. No focal deficits. Extremities: No clubbing,  or cyanosis. 1+ edema. Skin: No rashes, lesions or ulcers. Psychiatry: Judgement and insight appear normal. Mood & affect appropriate.   Data Reviewed:   I have personally reviewed following labs and imaging studies:  Labs: Basic Metabolic Panel:  Recent Labs Lab 12/31/15 0246  NA 136  K 3.5  CL 104  CO2 22  GLUCOSE 99  BUN 14  CREATININE 1.29*  CALCIUM 8.5*   GFR Estimated Creatinine Clearance: 35.5 mL/min (by C-G formula based on SCr of 1.29 mg/dL (H)). Liver Function Tests: No results for  input(s): AST, ALT, ALKPHOS, BILITOT, PROT, ALBUMIN in the last 168 hours. No results for input(s): LIPASE, AMYLASE in the last 168 hours. No results for input(s): AMMONIA in the last 168 hours. Coagulation profile  Recent Labs Lab 12/30/15 0508 12/31/15 0246  INR 1.95 1.75    CBC:  Recent Labs Lab 12/30/15 0508 12/31/15 0246  WBC 5.5 4.8  HGB 8.3* 8.8*  HCT 26.7* 28.3*  MCV 92.4 92.8  PLT 98* 94*   CBG:  Recent Labs Lab 12/30/15 0042 12/30/15 1224  GLUCAP 103* 112*   Hgb A1c: No results for input(s): HGBA1C in the last 72 hours. Lipid Profile:  Recent Labs  12/30/15 0508  CHOL 89  HDL 20*  LDLCALC 53  TRIG 79  CHOLHDL 4.5    Microbiology No results found for this or any previous visit (from the past 240 hour(s)).  Radiology: Dg Chest 2 View  Result Date: 12/30/2015 CLINICAL DATA:  Per RN notes as pt cannot provide info: [place] daughter called RN to come to room for "patient decline" (  RN completed assessment 25 minutes prior, no new symptoms, per patient daughter patient remains same all day). EXAM: CHEST  2 VIEW COMPARISON:  03/04/2015 FINDINGS: Left-sided transvenous pacemaker lead to the right ventricle. Right-sided PICC line has been removed since the prior study. Status post median sternotomy, CABG, and valve replacement. The heart is enlarged. Calcified granulomata are present. There are small bilateral pleural effusions. There is minimal atelectasis or possible early infiltrate at the left lung base. IMPRESSION: 1. Cardiomegaly. 2. Left lower lobe atelectasis or early infiltrate. Electronically Signed   By: Nolon Nations M.D.   On: 12/30/2015 23:00   Ct Head Wo Contrast  Result Date: 12/30/2015 CLINICAL DATA:  82 year old male with left-sided weakness and slurred speech. Atrial fibrillation and hypertension. Initial encounter. EXAM: CT HEAD WITHOUT CONTRAST TECHNIQUE: Contiguous axial images were obtained from the base of the skull through the vertex  without intravenous contrast. COMPARISON:  09/10/2012 head CT. FINDINGS: Brain: No intracranial hemorrhage. Moderate chronic microvascular changes without CT evidence of large acute infarct. No intracranial mass lesion noted on this unenhanced exam. Mild global atrophy without hydrocephalus. Vascular: Vascular calcifications. Skull: No acute abnormality.  No destructive lesion. Sinuses/Orbits: No acute orbital abnormality. Post lens replacement. Visualized paranasal sinuses, mastoid air cells and middle ear cavities are clear. Other: Negative IMPRESSION: Moderate chronic microvascular changes without CT evidence of large acute infarct. No intracranial hemorrhage. Mild global atrophy. Electronically Signed   By: Genia Del M.D.   On: 12/30/2015 07:48    Medications:   . amoxicillin-clavulanate  1 tablet Oral Q12H  . ferrous sulfate  325 mg Oral Q breakfast  . folic acid  1 mg Oral Daily  . nitrofurantoin (macrocrystal-monohydrate)  100 mg Oral Q12H  . predniSONE  5 mg Oral Q breakfast  . rOPINIRole  2 mg Oral Daily  . simvastatin  20 mg Oral QHS  . Warfarin - Pharmacist Dosing Inpatient   Does not apply q1800   Continuous Infusions: . heparin 1,100 Units/hr (12/31/15 0240)    Medical decision making is of high complexity and this patient is at high risk of deterioration, therefore this is a level 3 visit.     LOS: 2 days   RAMA,CHRISTINA  [facility] Pager [phone]. If unable to reach me by pager, please call my cell phone at [phone].  *Please refer to [email or web address], password [email or web address] to get updated schedule on who will round on this patient, as hospitalists switch teams weekly. If 7PM-7AM, please contact night-coverage at [email or web address], password [email or web address] for any overnight needs.  12/31/2015, 8:42 AM

## 2016-01-01 ENCOUNTER — Encounter (HOSPITAL_COMMUNITY): Payer: Self-pay | Admitting: Cardiology

## 2016-01-01 DIAGNOSIS — Z951 Presence of aortocoronary bypass graft: Secondary | ICD-10-CM

## 2016-01-01 DIAGNOSIS — M25462 Effusion, left knee: Secondary | ICD-10-CM

## 2016-01-01 DIAGNOSIS — Z87891 Personal history of nicotine dependence: Secondary | ICD-10-CM

## 2016-01-01 DIAGNOSIS — D696 Thrombocytopenia, unspecified: Secondary | ICD-10-CM | POA: Diagnosis present

## 2016-01-01 DIAGNOSIS — I2510 Atherosclerotic heart disease of native coronary artery without angina pectoris: Secondary | ICD-10-CM

## 2016-01-01 DIAGNOSIS — D649 Anemia, unspecified: Secondary | ICD-10-CM

## 2016-01-01 DIAGNOSIS — Z8619 Personal history of other infectious and parasitic diseases: Secondary | ICD-10-CM

## 2016-01-01 DIAGNOSIS — Z96652 Presence of left artificial knee joint: Secondary | ICD-10-CM

## 2016-01-01 DIAGNOSIS — R7881 Bacteremia: Secondary | ICD-10-CM

## 2016-01-01 DIAGNOSIS — I639 Cerebral infarction, unspecified: Principal | ICD-10-CM

## 2016-01-01 DIAGNOSIS — I330 Acute and subacute infective endocarditis: Secondary | ICD-10-CM

## 2016-01-01 DIAGNOSIS — B354 Tinea corporis: Secondary | ICD-10-CM

## 2016-01-01 DIAGNOSIS — G459 Transient cerebral ischemic attack, unspecified: Secondary | ICD-10-CM

## 2016-01-01 DIAGNOSIS — Z91048 Other nonmedicinal substance allergy status: Secondary | ICD-10-CM

## 2016-01-01 DIAGNOSIS — Z881 Allergy status to other antibiotic agents status: Secondary | ICD-10-CM

## 2016-01-01 DIAGNOSIS — Z952 Presence of prosthetic heart valve: Secondary | ICD-10-CM

## 2016-01-01 DIAGNOSIS — B351 Tinea unguium: Secondary | ICD-10-CM

## 2016-01-01 LAB — CBC
HCT: 29.4 % — ABNORMAL LOW (ref 39.0–52.0)
Hemoglobin: 9.1 g/dL — ABNORMAL LOW (ref 13.0–17.0)
MCH: 28.6 pg (ref 26.0–34.0)
MCHC: 31 g/dL (ref 30.0–36.0)
MCV: 92.5 fL (ref 78.0–100.0)
PLATELETS: 92 10*3/uL — AB (ref 150–400)
RBC: 3.18 MIL/uL — AB (ref 4.22–5.81)
RDW: 17.3 % — ABNORMAL HIGH (ref 11.5–15.5)
WBC: 5.2 10*3/uL (ref 4.0–10.5)

## 2016-01-01 LAB — HEMOGLOBIN A1C
Hgb A1c MFr Bld: 5.2 % (ref 4.8–5.6)
MEAN PLASMA GLUCOSE: 103 mg/dL

## 2016-01-01 LAB — VAS US CAROTID
LEFT VERTEBRAL DIAS: -4 cm/s
RIGHT ECA DIAS: -16 cm/s
RIGHT VERTEBRAL DIAS: 17 cm/s
Right CCA prox dias: -10 cm/s
Right CCA prox sys: -56 cm/s
Right cca dist sys: -130 cm/s

## 2016-01-01 LAB — BASIC METABOLIC PANEL
Anion gap: 7 (ref 5–15)
BUN: 12 mg/dL (ref 6–20)
CALCIUM: 8.9 mg/dL (ref 8.9–10.3)
CO2: 25 mmol/L (ref 22–32)
CREATININE: 1.37 mg/dL — AB (ref 0.61–1.24)
Chloride: 105 mmol/L (ref 101–111)
GFR calc Af Amer: 50 mL/min — ABNORMAL LOW (ref >60)
GFR calc non Af Amer: 43 mL/min — ABNORMAL LOW (ref >60)
GLUCOSE: 145 mg/dL — AB (ref 65–99)
Potassium: 3.7 mmol/L (ref 3.5–5.1)
Sodium: 137 mmol/L (ref 135–145)

## 2016-01-01 LAB — PROTIME-INR
INR: 2.32
Prothrombin Time: 25.9 seconds — ABNORMAL HIGH (ref 11.4–15.2)

## 2016-01-01 LAB — HEPARIN LEVEL (UNFRACTIONATED): HEPARIN UNFRACTIONATED: 0.32 [IU]/mL (ref 0.30–0.70)

## 2016-01-01 LAB — GLUCOSE, CAPILLARY: Glucose-Capillary: 107 mg/dL — ABNORMAL HIGH (ref 65–99)

## 2016-01-01 LAB — SEDIMENTATION RATE: Sed Rate: 2 mm/hr (ref 0–16)

## 2016-01-01 MED ORDER — WHITE PETROLATUM GEL
Status: DC | PRN
  Filled 2016-01-01: qty 1

## 2016-01-01 MED ORDER — DEXTROSE 5 % IV SOLN
2.0000 g | INTRAVENOUS | Status: DC
  Administered 2016-01-02 – 2016-01-08 (×7): 2 g via INTRAVENOUS
  Filled 2016-01-01 (×10): qty 2

## 2016-01-01 MED ORDER — SODIUM CHLORIDE 0.9 % IV SOLN: 200.0000 mg | INTRAVENOUS | Status: DC

## 2016-01-01 MED ORDER — SODIUM CHLORIDE 0.9 % IV SOLN
100.0000 mg | INTRAVENOUS | Status: DC
  Administered 2016-01-02 – 2016-01-03 (×2): 100 mg via INTRAVENOUS
  Filled 2016-01-01 (×3): qty 100

## 2016-01-01 MED ORDER — WARFARIN SODIUM 3 MG PO TABS
3.0000 mg | ORAL_TABLET | Freq: Once | ORAL | Status: AC
  Administered 2016-01-01: 3 mg via ORAL
  Filled 2016-01-01: qty 1

## 2016-01-01 MED ORDER — SODIUM CHLORIDE 0.9 % IV SOLN
INTRAVENOUS | Status: DC
  Administered 2016-01-02: 15:00:00 via INTRAVENOUS

## 2016-01-01 MED ORDER — SODIUM CHLORIDE 0.9 % IV SOLN
200.0000 mg | Freq: Once | INTRAVENOUS | Status: AC
  Administered 2016-01-02: 200 mg via INTRAVENOUS
  Filled 2016-01-01: qty 200

## 2016-01-01 NOTE — Progress Notes (Signed)
Physical Therapy Treatment Patient Details Name: Ethan Lawrence MRN: [id number] DOB: 02/10/1923 Today's Date: 01/01/2016    History of Present Illness 82 y.o.malewith medical history significant of Prosthetic aortic valve replacement, pacemaker for complete heart block. Pt presenting with acute onset of L sided weakness, aphasia. CT on 10/21 neg for acute infarct.    PT Comments    Pt with extensive L knee swelling and pain greatly limiting ability to tolerate weight-bearing during transfers and is unable to ambulate at this time. Pt given HEP to help move fluid in knee and decrease stiffness and pain.Pt remains to require assist x2 for all transfers and OOB mobility. Con't to recommend SNF upon d/c. Acute PT to follow.   Follow Up Recommendations  SNF;Supervision/Assistance - 24 hour     Equipment Recommendations  Rolling walker with 5" wheels    Recommendations for Other Services       Precautions / Restrictions Precautions Precautions: Fall Restrictions Weight Bearing Restrictions: No (however pt self WBAT L LE due to pain)    Mobility  Bed Mobility Overal bed mobility: Needs Assistance Bed Mobility: Supine to Sit     Supine to sit: Mod assist;Max assist;+2 for physical assistance;HOB elevated     General bed mobility comments: mod/maxA for L LE management, another person to assist with trunk elevation and scooting hips to EOB ,pt did push up with BIlat UEs to finish scooting to EOB  Transfers Overall transfer level: Needs assistance Equipment used: Rolling walker (2 wheeled);2 person hand held assist Transfers: Sit to/from [employer] CorporationStand;Stand Pivot Transfers Sit to Stand: Mod assist;+2 physical assistance Stand pivot transfers: Mod assist;+2 physical assistance       General transfer comment: limited L LE WBing, modAx2 to power up and maintain balance during transition of UE from bed to RW. modA to steady pt during stepping with R foot due to L LE pain and  limited weight bearing. Pt with extreme trunk flexion and unable to clear R foot  Ambulation/Gait             General Gait Details: unable   Stairs            Wheelchair Mobility    Modified Rankin (Stroke Patients Only) Modified Rankin (Stroke Patients Only) Pre-Morbid Rankin Score: Moderate disability Modified Rankin: Severe disability     Balance Overall balance assessment: History of Falls;Needs assistance Sitting-balance support: Feet supported;No upper extremity supported Sitting balance-Leahy Scale: Fair     Standing balance support: Bilateral upper extremity supported Standing balance-Leahy Scale: Zero Standing balance comment: unable to stand without RW or external support                    Cognition Arousal/Alertness: Awake/alert Behavior During Therapy: Flat affect Overall Cognitive Status: Impaired/Different from baseline         Following Commands: Follows one step commands with increased time     Problem Solving: Slow processing;Requires verbal cues;Requires tactile cues      Exercises General Exercises - Lower Extremity Ankle Circles/Pumps: AROM;Both;10 reps Quad Sets: Left;10 reps;Supine Short Arc Quad: AAROM;Left;20 reps;Supine;Seated Heel Slides: AAROM;Left;10 reps;Seated Hip Flexion/Marching: AROM;Left;10 reps    General Comments        Pertinent Vitals/Pain Pain Assessment: 0-10 Pain Score: 5  Pain Location: L knee Pain Intervention(s): Repositioned;Monitored during session    Home Living     Available Help at Discharge: Family Type of Home: House  Prior Function            PT Goals (current goals can now be found in the care plan section) Progress towards PT goals: Progressing toward goals    Frequency    Min 3X/week      PT Plan Current plan remains appropriate    Co-evaluation             End of Session Equipment Utilized During Treatment: Gait belt Activity  Tolerance: Patient limited by fatigue;Patient limited by lethargy Patient left: in chair;with call bell/phone within reach;with chair alarm set;with nursing/sitter in room;with family/visitor present     Time: 0454-0981 PT Time Calculation (min) (ACUTE ONLY): 29 min  Charges:  $Therapeutic Exercise: 8-22 mins $Therapeutic Activity: 8-22 mins                    G Codes:      Marcene Brawn 01/01/2016, 10:57 AM   Lewis Shock, PT, DPT Pager #: [phone] Office #: [phone]

## 2016-01-01 NOTE — Care Management Note (Signed)
Case Management Note  Patient Details  Name: Ethan Lawrence MRN: [id number] Date of Birth: May 27, 1922  Subjective/Objective:   Pt in with fevers and r/o CVA. He is from home with family.                 Action/Plan: Awaiting TEE tomorrow. PT/OT recommending SNF. CM following for d/c needs.   Expected Discharge Date:  01/01/16               Expected Discharge Plan:  Skilled Nursing Facility  In-House Referral:     Discharge planning Services     Post Acute Care Choice:    Choice offered to:     DME Arranged:    DME Agency:     HH Arranged:    HH Agency:     Status of Service:  In process, will continue to follow  If discussed at Long Length of Stay Meetings, dates discussed:    Additional Comments:  Kermit BaloKelli F Caeleb Batalla, RN 01/01/2016, 2:21 PM

## 2016-01-01 NOTE — Evaluation (Signed)
Speech Language Pathology Evaluation Patient Details Name: Providence LaniusCharles E Morawski MRN: [id number] DOB: 10-28-1922 Today's Date: 01/01/2016 Time: 6295-28410945-1016 SLP Time Calculation (min) (ACUTE ONLY): 31 min  Problem List:  Patient Active Problem List   Diagnosis Date Noted  . Thrombocytopenia (HCC) 01/01/2016  . Left knee pain   . Acute CVA (cerebrovascular accident) (HCC) 12/30/2015  . Acute ischemic stroke (HCC) 12/29/2015  . CRBSI (catheter-related bloodstream infection), subsequent encounter 12/29/2015  . History of endocarditis 12/29/2015  . Diskitis   . Lumbar back pain   . Pacemaker infection (HCC)   . Intractable back pain 02/26/2015  . Dyspnea on exertion 02/21/2015  . Mitral regurgitation 02/21/2015  . Pacemaker 09/01/2013  . S/P AVR 03/01/2013  . Chronic diastolic heart failure (HCC) 03/01/2013  . Long term current use of anticoagulant therapy 03/01/2013  . S/P left TKA 02/15/2013  . 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 History of AAA (abdominal aortic aneurysm) repair 06/18/2011  . Normocytic anemia 06/18/2011  . Arthritis 06/18/2011  . Chronic a-fib on Coumadin 06/18/2011  . CAD (coronary artery disease) s/p CABG 06/18/2011  . Essential hypertension 06/18/2011   Past Medical History:  Past Medical History:  Diagnosis Date  . AAA (abdominal aortic aneurysm) (HCC)    a. 06/2010 s/p Endovascular AAA Repair  . Acute endocarditis 02/28/2015  . Anemia requiring transfusions    MOST RECENT WAS JUNE 2014 WITH HEART SURGERY  . Aortic stenosis    a. 08/2012 TEE: EF 45-55%, critical AS, Triv AI, mild to mod MR, Sev TR;  b. 08/2012 AVR (21mm Magna Ease pericardial tissue valve) & TV Repair (30mm MC3 annuloplasty ring).  . Arthritis    LEFT KNEE OA AND PAIN;  DJD LUMBAR  . Atrial fibrillation (HCC)   . Blood transfusion without reported diagnosis   . CAD (coronary artery disease)    a. 05/2004 CABGx4 (LIMA->LAD, VG->RI, VG->OM, VG->RCA);  b. 08/2012 Cath: Native  3VD with 3/4 patent grafts (VG->RI occluded).  . Cataract   . CHF (congestive heart failure) (HCC)   . 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 Complication of anesthesia    STATES HE DOES WELL WITH DIPROVAN  . GI bleed 06/18/2011  . Hyperlipidemia   . Hypertension   . Macular degeneration   . Pacemaker   . Pacemaker infection (HCC)   . Pain    PT HAS A LOT OF LOWER BACK PAIN- PREVIOUS BACK SURGERY AND DJD  . Streptococcus viridans infection 02/28/2015   Past Surgical History:  Past Surgical History:  Procedure Laterality Date  . AORTIC VALVE REPLACEMENT N/A 09/01/2012   Procedure: REDO AORTIC VALVE REPLACEMENT (AVR);  Surgeon: Kerin PernaPeter Van Trigt, MD;  [facility];  Service: Open Heart Surgery;  Laterality: N/A;  . APPENDECTOMY    . BACK SURGERY    . CARDIAC CATHETERIZATION    . CARDIAC VALVE REPLACEMENT    . COLONOSCOPY  06/19/2011   Procedure: COLONOSCOPY;  Surgeon: Graylin ShiverSalem F Ganem, MD;  [facility];  Service: Gastroenterology;  Laterality: N/A;  . CORONARY ARTERY BYPASS GRAFT    . ENDOVASCULAR STENT INSERTION  07/05/2010  . ESOPHAGOGASTRODUODENOSCOPY  06/19/2011   Procedure: ESOPHAGOGASTRODUODENOSCOPY (EGD);  Surgeon: Graylin ShiverSalem F Ganem, MD;  [facility];  Service: Gastroenterology;  Laterality: N/A;  . EYE SURGERY     bilater cataracts removed  . HERNIA REPAIR    . INTRAOPERATIVE TRANSESOPHAGEAL ECHOCARDIOGRAM N/A 09/01/2012   Procedure: INTRAOPERATIVE  TRANSESOPHAGEAL ECHOCARDIOGRAM;  Surgeon: Kerin Perna, MD;  Location: [facility] OR;  Service: Open Heart Surgery;  Laterality: N/A;  . JOINT REPLACEMENT    . PACEMAKER INSERTION  09/07/2012   Medtronic Sensia single chamber pacemaker implanted by Dr Ladona Ridgel   . PERMANENT PACEMAKER INSERTION N/A 09/07/2012   Procedure: PERMANENT PACEMAKER INSERTION;  Surgeon: Marinus Maw, MD;  Location: [facility] CATH LAB;  Service: Cardiovascular;  Laterality: N/A;  . PROSTATE SURGERY    . SPINE  SURGERY    . SURGERY ABOUT 50 YRS AGO FOR PERFORATED ULCER AND REPAIR OF HIATAL HERNIA    . TEE WITHOUT CARDIOVERSION N/A 02/27/2015   Procedure: TRANSESOPHAGEAL ECHOCARDIOGRAM (TEE);  Surgeon: Chilton Si, MD;  Location: [facility] ENDOSCOPY;  Service: Cardiovascular;  Laterality: N/A;  . TOTAL KNEE ARTHROPLASTY Left 02/15/2013   Procedure: LEFT TOTAL KNEE ARTHROPLASTY;  Surgeon: Shelda Pal, MD;  Location: [facility] ORS;  Service: Orthopedics;  Laterality: Left;  . TRICUSPID VALVE REPLACEMENT N/A 09/01/2012   Procedure: TRICUSPID VALVE REPAIR;  Surgeon: Kerin Perna, MD;  Location: [facility] OR;  Service: Open Heart Surgery;  Laterality: N/A;  . VASCULAR SURGERY     HPI:  Haldon Carley Blankenshipis a 82 y.o.malewith medical history significant of Prosthetic aortic valve replacement, pacemaker for complete heart block. In December of 2016 he had S.Viridans bacteremia, TEE revealed a small mobile structure (vegetation vs suture) on the ring of the bioprosthetic aortic valve. Patient treated with 6 weeks of rocephin. He apparently had another bout of recurrent bacteremia in ~March or so (6 weeks ABX), and yet a 3rd for which he just finished up his ABx for about a month ago. Despite finishing ABx his PICC line was left in. And earlier this week he developed fever 103, and wasn't feeling well. He was admitted earlier this week to [facility]. Per family the "Picc line was source of infection and was removed". Patient was discharged on Augmentin and macrobid. Today he developed L sided weakness, aphasia. BGL initially low and was corrected but Aphasia persists. He was transferred from [facility] to [facility] for neurology consultation.  Most recent head CT is negative for acute findings showing moderate chronic microvascular changes.     Assessment / Plan / Recommendation Clinical Impression  Cognitive/linguistic/motor speech evaluation was completed.  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  He does have help from his don in law and daughter for the day to day operations.  The patient achieved a score of 16/30 on the Mini Mental State Exam indicating deficits.  Issues were noted for orientation to time, attention to task and copying.  He was able to  recall 3 novel words immediately and was 2/3 given delayed recall, name objects, repeat a short sentence, follow a 3 step direction, read and write a short sentence.  The patient was having pain in his knee and did seem distracted.    The daughter reported that he is normally able to answer many of the questions he demonstrated difficulty performing.  Motor speech deficits were not seen.  Recommend ST follow up to address potential cognitive deficits.      SLP Assessment  Patient needs continued Speech Lanaguage Pathology Services    Follow Up Recommendations   (TBD)    Frequency and Duration min 2x/week  2 weeks      SLP Evaluation Cognition  Overall Cognitive Status: Impaired/Different from baseline Arousal/Alertness: Awake/alert Orientation Level: Oriented to  person;Oriented to place;Oriented to situation;Disoriented to time Attention: Focused Focused Attention: Impaired Focused Attention Impairment: Verbal basic Memory: Appears intact Awareness: Appears intact Problem Solving: Appears intact       Comprehension  Auditory Comprehension Overall Auditory Comprehension: Appears within functional limits for tasks assessed Yes/No Questions: Within Functional Limits Commands: Within Functional Limits Conversation: Simple Reading Comprehension Reading Status: Within funtional limits    Expression Expression Primary Mode of Expression: Verbal Verbal Expression Overall Verbal Expression: Appears within functional limits for tasks assessed Initiation: No impairment Automatic Speech: Name;Social Response Level of Generative/Spontaneous Verbalization:  Conversation Repetition: No impairment Naming: No impairment Pragmatics: No impairment Non-Verbal Means of Communication: Not applicable Written Expression Dominant Hand: Right Written Expression: Within Functional Limits   Oral / Motor  Motor Speech Overall Motor Speech: Appears within functional limits for tasks assessed Respiration: Within functional limits Phonation: Normal Resonance: Within functional limits Articulation: Within functional limitis Intelligibility: Intelligible Motor Planning: Witnin functional limits Motor Speech Errors: Not applicable   GO                    Dimas Aguas, MA, CCC-SLP Acute Rehab SLP [phone] Fleet Contras 01/01/2016, 10:24 AM

## 2016-01-01 NOTE — Consult Note (Signed)
Reason for Consult:  For a fib   Referring Physician: Dr. Rockne Menghini   PCP:  PROVIDER NOT IN SYSTEM--  Dr Marina Gravel in [place].  Primary Cardiologist:Dr. Tamala Julian EP: Dr. Viann Shove is an 82 y.o. male.    Chief Complaint: Admitted 12/29/15 with CVA   HPI: Asked to see 82 year old man with a history of aortic stenosis status post aortic valve replacement carried out approximately 3 years ago, which was complicated by the development of complete heart block for which the patient underwent insertion of a permanent pacemaker. He has class II heart failure symptoms.CABG 4 in 2006.  The patient also has chronic atrial fibrillation with a controlled ventricular response.  Last cath 08/2012 with patent LIMA to LAD, VG to Ramus intermedius totally occluded, VG to OM widely patent, VG to RCA patent.  EF > 50%.  Also recurrent  recurrent bacteremia in ~March or so (6 weeks ABX), and yet a 3rd for which he just finished up his ABx for about a month ago.  Pt had PICC line that remained and earlier this week with fevere 103.  PICC line removed.  Pt on Augmentin and macrobid.    Pt admitted 12/29/15 with ischemic CVA with INR at 1.75.  Coumadin followed by PCP in [place]. Dr. Marina Gravel    Pt developed Lt side weakness and aphasia.  CT head is negative, Tm 99.7, WBC WNL, INR 2.0, HGB 8.9 (appears to have chronic anemia in this range).  Creat 1.7 (appears to be about baseline). Per neuro "recurrent TIAs with intermittent expressive aphasia, likely embolic phenomena."  Echo with new decrease in EF from 50-60% to 40-45%, mod AS is same and now moderate to severe MR from moderate.  Now Lt and Rt atriums dilated, new from 02/2015.  TEE has been ordered.  TEE has been scheduled for tomorrow.    Past Medical History:  Diagnosis Date  . AAA (abdominal aortic aneurysm) ([name])    a. 06/2010 s/p Endovascular AAA Repair  . Acute endocarditis 02/28/2015  . Anemia requiring transfusions    MOST RECENT  WAS JUNE 2014 WITH HEART SURGERY  . Aortic stenosis    a. 08/2012 TEE: EF 45-55%, critical AS, Triv AI, mild to mod MR, Sev TR;  b. 08/2012 AVR (3m Magna Ease pericardial tissue valve) & TV Repair (347mMC3 annuloplasty ring).  . Arthritis    LEFT KNEE OA AND PAIN;  DJD LUMBAR  . Atrial fibrillation ([facility]  . Blood transfusion without reported diagnosis   . CAD (coronary artery disease)    a. 05/2004 CABGx4 (LIMA->LAD, VG->RI, VG->OM, VG->RCA);  b. 08/2012 Cath: Native 3VD with 3/4 patent grafts (VG->RI occluded).  . Cataract   . CHF (congestive heart failure) ([facility]  . Complete heart block, [place] ([place]   s/p MDT pacemaker implant by Dr TaLovena Le. Complication of anesthesia    STATES HE DOES WELL WITH DIPROVAN  . GI bleed 06/18/2011  . Hyperlipidemia   . Hypertension   . Macular degeneration   . Pacemaker   . Pacemaker infection ([facility]  . Pain    PT HAS A LOT OF LOWER BACK PAIN- PREVIOUS BACK SURGERY AND DJD  . Streptococcus viridans infection 02/28/2015    Past Surgical History:  Procedure Laterality Date  . AORTIC VALVE REPLACEMENT N/A 09/01/2012   Procedure: REDO AORTIC VALVE REPLACEMENT (AVR);  Surgeon: PeIvin PootMD;  [facility]  Service: Open Heart Surgery;  Laterality: N/A;  . APPENDECTOMY    . BACK SURGERY    . CARDIAC CATHETERIZATION    . CARDIAC VALVE REPLACEMENT    . COLONOSCOPY  06/19/2011   Procedure: COLONOSCOPY;  Surgeon: Wonda Horner, MD;  Location: [facility];  Service: Gastroenterology;  Laterality: N/A;  . CORONARY ARTERY BYPASS GRAFT    . ENDOVASCULAR STENT INSERTION  07/05/2010  . ESOPHAGOGASTRODUODENOSCOPY  06/19/2011   Procedure: ESOPHAGOGASTRODUODENOSCOPY (EGD);  Surgeon: Wonda Horner, MD;  Location: [facility];  Service: Gastroenterology;  Laterality: N/A;  . EYE SURGERY     bilater cataracts removed  . HERNIA REPAIR    . INTRAOPERATIVE TRANSESOPHAGEAL ECHOCARDIOGRAM N/A 09/01/2012   Procedure: INTRAOPERATIVE TRANSESOPHAGEAL  ECHOCARDIOGRAM;  Surgeon: Ivin Poot, MD;  Location: [facility];  Service: Open Heart Surgery;  Laterality: N/A;  . JOINT REPLACEMENT    . PACEMAKER INSERTION  09/07/2012   Medtronic Sensia single chamber pacemaker implanted by Dr Lovena Le   . PERMANENT PACEMAKER INSERTION N/A 09/07/2012   Procedure: PERMANENT PACEMAKER INSERTION;  Surgeon: Evans Lance, MD;  Location: [facility] CATH LAB;  Service: Cardiovascular;  Laterality: N/A;  . PROSTATE SURGERY    . SPINE SURGERY    . SURGERY ABOUT 50 YRS AGO FOR PERFORATED ULCER AND REPAIR OF HIATAL HERNIA    . TEE WITHOUT CARDIOVERSION N/A 02/27/2015   Procedure: TRANSESOPHAGEAL ECHOCARDIOGRAM (TEE);  Surgeon: Skeet Latch, MD;  Location: [facility];  Service: Cardiovascular;  Laterality: N/A;  . TOTAL KNEE ARTHROPLASTY Left 02/15/2013   Procedure: LEFT TOTAL KNEE ARTHROPLASTY;  Surgeon: Mauri Pole, MD;  Location: [facility];  Service: Orthopedics;  Laterality: Left;  . TRICUSPID VALVE REPLACEMENT N/A 09/01/2012   Procedure: TRICUSPID VALVE REPAIR;  Surgeon: Ivin Poot, MD;  Location: [facility];  Service: Open Heart Surgery;  Laterality: N/A;  . VASCULAR SURGERY      Family History  Problem Relation Age of Onset  . Coronary artery disease Brother   . Healthy Mother   . Emphysema Father   . Healthy Sister   . Alzheimer's disease Brother    Social History:  reports that he quit smoking about 40 years ago. He has never used smokeless tobacco. He reports that he does not drink alcohol or use drugs.  Allergies:  Allergies  Allergen Reactions  . Tape Other (See Comments)    Pulls skin off  . Vancomycin Other (See Comments)    Hearing loss    @medshecduled @ @medinfusions @ acetaminophen, docusate sodium, HYDROcodone-acetaminophen  Results for orders placed or performed during the hospital encounter of 12/29/15 (from the past 48 hour(s))  Glucose, capillary     Status: Abnormal   Collection Time: 12/30/15 12:24 PM  Result Value Ref Range    Glucose-Capillary 112 (H) 65 - 99 mg/dL  Heparin level (unfractionated)     Status: Abnormal   Collection Time: 12/30/15 10:11 PM  Result Value Ref Range   Heparin Unfractionated 0.15 (L) 0.30 - 0.70 IU/mL    Comment:        IF HEPARIN RESULTS ARE BELOW EXPECTED VALUES, AND PATIENT DOSAGE HAS BEEN CONFIRMED, SUGGEST FOLLOW UP TESTING OF ANTITHROMBIN III LEVELS.   CBC     Status: Abnormal   Collection Time: 12/31/15  2:46 AM  Result Value Ref Range   WBC 4.8 4.0 - 10.5 K/uL   RBC 3.05 (L) 4.22 - 5.81 MIL/uL   Hemoglobin 8.8 (L) 13.0 - 17.0 g/dL   HCT 28.3 (L) 39.0 -  52.0 %   MCV 92.8 78.0 - 100.0 fL   MCH 28.9 26.0 - 34.0 pg   MCHC 31.1 30.0 - 36.0 g/dL   RDW 17.2 (H) 11.5 - 15.5 %   Platelets 94 (L) 150 - 400 K/uL    Comment: CONSISTENT WITH PREVIOUS RESULT  Basic metabolic panel     Status: Abnormal   Collection Time: 12/31/15  2:46 AM  Result Value Ref Range   Sodium 136 135 - 145 mmol/L   Potassium 3.5 3.5 - 5.1 mmol/L   Chloride 104 101 - 111 mmol/L   CO2 22 22 - 32 mmol/L   Glucose, Bld 99 65 - 99 mg/dL   BUN 14 6 - 20 mg/dL   Creatinine, Ser 1.29 (H) 0.61 - 1.24 mg/dL   Calcium 8.5 (L) 8.9 - 10.3 mg/dL   GFR calc non Af Amer 47 (L) >60 mL/min   GFR calc Af Amer 54 (L) >60 mL/min    Comment: (NOTE) The eGFR has been calculated using the CKD EPI equation. This calculation has not been validated in all clinical situations. eGFR's persistently <60 mL/min signify possible Chronic Kidney Disease.    Anion gap 10 5 - 15  Protime-INR     Status: Abnormal   Collection Time: 12/31/15  2:46 AM  Result Value Ref Range   Prothrombin Time 20.7 (H) 11.4 - 15.2 seconds   INR 1.75   Heparin level (unfractionated)     Status: Abnormal   Collection Time: 12/31/15  8:40 AM  Result Value Ref Range   Heparin Unfractionated 0.25 (L) 0.30 - 0.70 IU/mL    Comment:        IF HEPARIN RESULTS ARE BELOW EXPECTED VALUES, AND PATIENT DOSAGE HAS BEEN CONFIRMED, SUGGEST FOLLOW UP  TESTING OF ANTITHROMBIN III LEVELS.   Heparin level (unfractionated)     Status: None   Collection Time: 12/31/15  7:20 PM  Result Value Ref Range   Heparin Unfractionated 0.34 0.30 - 0.70 IU/mL    Comment:        IF HEPARIN RESULTS ARE BELOW EXPECTED VALUES, AND PATIENT DOSAGE HAS BEEN CONFIRMED, SUGGEST FOLLOW UP TESTING OF ANTITHROMBIN III LEVELS.   CBC     Status: Abnormal   Collection Time: 01/01/16  5:00 AM  Result Value Ref Range   WBC 5.2 4.0 - 10.5 K/uL   RBC 3.18 (L) 4.22 - 5.81 MIL/uL   Hemoglobin 9.1 (L) 13.0 - 17.0 g/dL   HCT 29.4 (L) 39.0 - 52.0 %   MCV 92.5 78.0 - 100.0 fL   MCH 28.6 26.0 - 34.0 pg   MCHC 31.0 30.0 - 36.0 g/dL   RDW 17.3 (H) 11.5 - 15.5 %   Platelets 92 (L) 150 - 400 K/uL    Comment: CONSISTENT WITH PREVIOUS RESULT  Heparin level (unfractionated)     Status: None   Collection Time: 01/01/16  5:09 AM  Result Value Ref Range   Heparin Unfractionated 0.32 0.30 - 0.70 IU/mL    Comment:        IF HEPARIN RESULTS ARE BELOW EXPECTED VALUES, AND PATIENT DOSAGE HAS BEEN CONFIRMED, SUGGEST FOLLOW UP TESTING OF ANTITHROMBIN III LEVELS.   Protime-INR     Status: Abnormal   Collection Time: 01/01/16  5:09 AM  Result Value Ref Range   Prothrombin Time 25.9 (H) 11.4 - 15.2 seconds   INR 2.32    Dg Chest 2 View  Result Date: 12/30/2015 CLINICAL DATA:  Per RN notes as pt  cannot provide info: [place] daughter called RN to come to room for "patient decline" (RN completed assessment 25 minutes prior, no new symptoms, per patient daughter patient remains same all day). EXAM: CHEST  2 VIEW COMPARISON:  03/04/2015 FINDINGS: Left-sided transvenous pacemaker lead to the right ventricle. Right-sided PICC line has been removed since the prior study. Status post median sternotomy, CABG, and valve replacement. The heart is enlarged. Calcified granulomata are present. There are small bilateral pleural effusions. There is minimal atelectasis or possible early infiltrate  at the left lung base. IMPRESSION: 1. Cardiomegaly. 2. Left lower lobe atelectasis or early infiltrate. Electronically Signed   By: Nolon Nations M.D.   On: 12/30/2015 23:00   Dg Knee Complete 4 Views Left  Result Date: 12/31/2015 CLINICAL DATA:  Left knee pain after falling 10 days ago. Stroke-like symptoms for 2 days. History of total knee arthroplasty in 2014. EXAM: LEFT KNEE - COMPLETE 4+ VIEW COMPARISON:  None. FINDINGS: The bones appear mildly demineralized. Patient is status post left total knee arthroplasty. The hardware is intact and well positioned. No evidence of acute fracture, dislocation or loosening. There is a small knee joint effusion with increased density in Hoffa's fat. There are diffuse vascular calcifications. IMPRESSION: No acute osseous findings or evidence of hardware loosening status post total knee arthroplasty. Knee joint effusion with increased density in Hoffa's fat. Electronically Signed   By: Richardean Sale M.D.   On: 12/31/2015 20:01  ECHO 12/30/15  Study Conclusions  - Left ventricle: The cavity size was normal. Wall thickness was   increased in a pattern of moderate LVH. Systolic function was   mildly to moderately reduced. The estimated ejection fraction was   in the range of 40% to 45%. - Aortic valve: There was moderate stenosis. Valve area (VTI): 1.19   cm^2. Valve area (Vmax): 1.15 cm^2. Valve area (Vmean): 1.11   cm^2. - Mitral valve: Moderately calcified annulus. There was moderate to   severe regurgitation. - Left atrium: The atrium was massively dilated. - Right atrium: The atrium was moderately dilated.   ROS: General:no colds + fevers, + weight loss 10 lbs since March appetite is fair Skin:no rashes or ulcers HEENT:no blurred vision, no congestion CV:see HPI PUL:see HPI GI:no diarrhea constipation or melena, no indigestion GU:no hematuria, no dysuria MS:+ Lt knee swelling and warmth, no claudication Neuro:no syncope, no lightheadedness,  has had 1 fall, speech now cleared Endo:no diabetes, no thyroid disease   Blood pressure 108/70, pulse 82, temperature 98.2 F (36.8 C), temperature source Oral, resp. rate 18, height 6' (1.829 m), weight 148 lb 5.9 oz (67.3 kg), SpO2 98 %.  Wt Readings from Last 3 Encounters:  12/30/15 148 lb 5.9 oz (67.3 kg)  12/06/15 154 lb (69.9 kg)  05/17/15 158 lb (71.7 kg)    PE: General:Pleasant affect, NAD Skin:Warm and dry, brisk capillary refill HEENT:normocephalic, sclera clear, mucus membranes moist Neck:supple, no JVD, no bruits  Heart:irreg irreg  with 6-2/7 systolic aortic murmur, no gallup, rub or click Lungs:clear to diminished without rales, rhonchi, or wheezes OJJ:KKXF, non tender, + BS, do not palpate liver spleen or masses Ext:1-2+ lower ext edema, 1+ pedal pulses, 2+ radial pulses, Lt knee is swollen and warm to touch Neuro:alert and oriented X 3, MAE, follows commands, + facial symmetry, speech is good    Assessment/Plan Principal Problem:   Acute ischemic stroke (HCC) Active Problems:   Normocytic anemia   Chronic a-fib on Coumadin   CAD (coronary artery disease)  s/p CABG   Essential hypertension   Chronic diastolic heart failure ([place])   Long term current use of anticoagulant therapy   Pacemaker   CRBSI (catheter-related bloodstream infection), subsequent encounter   History of endocarditis   Acute CVA (cerebrovascular accident) ([place])   Left knee pain   Thrombocytopenia ([place])  Ischemic stroke per neuro and IM  Abnormal Echo with mod to severe MR and massively dilated LA and dilated RA new fro 02/2015, mod AS is stable.  No sign of clot/vegetations but for TEE tomorrow.  Permanent atrial fib on coumadin, INR on arrival 1.75 CHA2DS2VASc of 7  Hx of recurrent endocarditis and now catheter--related bloodstream infection on Teflaro  Afebrile,  ID to see, plan for TEE tomorrow  Blood cultures pending  Anticoagulation INR today 2.32 on coumadin- per pharmacy also on  heparin ? stop  Anemia with hgb 9.1 , hx of anemia, on Iron  Thrombocytopenia with plts 92 new this admit was normal in Jan 2017  Hx AVR with (21 mm Magna Ease pericardial tissue valve) 08/2012  CAD with CABG 2004 and last cath 2014 with LIMA to LAD: Widely patent, SVG  to ramus intermedius: Totally occluded,  Saphenous vein graft to obtuse marginal: Widely patent, Saphenous vein graft to the right coronary: Widely patent  Mod to severe MR  HLD on zocor with stable lipids continue  [facility] has been requested to perform a transesophageal echocardiogram on Ethan Lawrence for CVA and bacteremia recurrence.  After careful review of history and examination, the risks and benefits of transesophageal echocardiogram have been explained including risks of esophageal damage, perforation (1:10,000 risk), bleeding, pharyngeal hematoma as well as other potential complications associated with conscious sedation including aspiration, arrhythmia, respiratory failure and death. Alternatives to treatment were discussed, questions were answered. Patient is willing to proceed.       Cecilie Kicks  Nurse Practitioner Certified [place] Pager [phone] or after 5pm or weekends call [phone] 01/01/2016, 11:58 AM    The patient has been seen in conjunction with Cecilie Kicks, NP. All aspects of care have been considered and discussed. The patient has been personally interviewed, examined, and all clinical data has been reviewed.   The patient is very well-known to me. History of chronic A. fib, aortic valve replacement, and permanent pacemaker. Over the past 9 months, 3 separate episodes of bacteremia. He has been on several six-week (plus) courses of antibiotics. Admitted now with fever of 103, and altered mental status.  Plan ID consult.  Repeat TEE to exclude prosthetic valve and pacemaker electrode endocarditis. Procedure is scheduled for 4 PM tomorrow. Nothing by  mouth after midnight.  Very high clinical likelihood of recurrent endocarditis.

## 2016-01-01 NOTE — Progress Notes (Signed)
ANTICOAGULATION CONSULT NOTE  Pharmacy Consult for Heparin and Warfarin Indication: afib with bioprosthetic AVR; likely new embolic event  Assessment: 82 y.o. M on warfarin PTA for afib and h/o bioprosthetic AVR with goal INR 2-3. Neuro wants heparin bridge with new goal INR 2.5-3 as likely embolic phenomena.   PTA Warfarin Dose: 7.5mg  Wed and 5mg  AODs with last dose 10/20 (INR on admission = 2)  Heparin level = 0.32 on IV heparin drip 1250 units/hr. Level is therapeutic with goal =0.3-0.5 units/ml. INR 2.32 up from 1.75. Will give lower dose today x 1. Pt also received one dose of Levaquin 500 mg on 10/22. Patient was having nosebleeds, but that they are easy to control. No report of increase or new bleeding.  Goal of Therapy:  INR 2.5-3 per neuro; Heparin level 0.3-0.5 units/ml Monitor platelets by anticoagulation protocol: Yes   Plan:  Continue IV heparin drip at 1250 units/hr Warfarin 3 mg x 1 dose. Will f/u AM heparin level. Monitor s/sx's of bleeding Monitor daily INR, heparin level, and CBC Per Neuro, continue heparin until INR >/= 2.5   Allergies  Allergen Reactions  . Tape Other (See Comments)    Pulls skin off  . Vancomycin Other (See Comments)    Hearing loss    Patient Measurements: Height: 6' (182.9 cm) Weight: 148 lb 5.9 oz (67.3 kg) IBW/kg (Calculated) : 77.6  Heparin Dosing Weight: 70 kg  Vital Signs: Temp: 98.6 F (37 C) (10/23 0555) Temp Source: Oral (10/23 0555) BP: 163/82 (10/23 0555) Pulse Rate: 83 (10/23 0555)  Labs:  Recent Labs  12/30/15 0508  12/31/15 0246 12/31/15 0840 12/31/15 1920 01/01/16 0509  HGB 8.3*  --  8.8*  --   --   --   HCT 26.7*  --  28.3*  --   --   --   PLT 98*  --  94*  --   --   --   LABPROT 22.5*  --  20.7*  --   --  25.9*  INR 1.95  --  1.75  --   --  2.32  HEPARINUNFRC  --   < >  --  0.25* 0.34 0.32  CREATININE  --   --  1.29*  --   --   --   < > = values in this interval not displayed.  Estimated  Creatinine Clearance: 35.5 mL/min (by C-G formula based on SCr of 1.29 mg/dL (H)).   Thank you for allowing us to participate in this patients care. Signe Coltonya C Cyan Clippinger, PharmD Pager: [phone] 01/01/2016 9:52 AM

## 2016-01-01 NOTE — Progress Notes (Signed)
Progress Note    Ethan Lawrence  JFH:[id number] DOB: Jul 29, 1922  DOA: 12/29/2015 PCP: PROVIDER NOT IN SYSTEM    Brief Narrative:   Chief complaint: Follow-up speech difficulty  Ethan Lawrence is an 82 y.o. male with a PMH of atrial fibrillation on Coumadin, heart block with pacemaker placement, hypertension, hyperlipidemia, coronary artery disease, prosthetic heart valve, CHF and AAA, transferred from [facility] for management of strokelike symptoms on 12/29/15. He has a history of bacteremia and heart valve vegetations in 2016 as well as recurrence in March 2017. He had an acute febrile illness earlier this week which was thought to be due to an indwelling PICC line. PICC line was removed. He had a CT scan of his head which showed no acute intracranial abnormality. MRI cannot be obtained because patient has a cardiac pacemaker. INR was 2.0 on admission.  Assessment/Plan:   Principal Problem:   Acute ischemic stroke [facility]) Neurology consulted on admission. Full stroke workup initiated. Continue telemetry. PT/OT/ST consults requested. LDL at goal at 53, continue Zocor. Follow-up hemoglobin A1c. CT scan obtained which showed moderate chronic microvascular changes without evidence of a large acute infarct. Mild global atrophy. 2-D echo showed an EF of 40-45 percent, moderate aortic stenosis, no source of embolism. Right carotid duplex showed mild disease.  TEE Scheduled for 01/02/16 to rule out murantic emboli.  Active Problems:   Left knee pain H/O TKR.  Joint now swollen, warm compared to right knee.  Plain films showed no acute osseous findings or evidence of hardware loosening, but did show a knee joint effusion with increased density in Hoffa's fat.  I have personally reviewed the images and agree with these findings.ESR not elevated. Unable to get IR to perform arthrocentesis secondary to being on blood thinners.    Dyspnea Resolved. Chest  x-ray showed a left lower lobe early infiltrate versus atelectasis. Afebrile. No evidence of pneumonia clinically.    Coronary artery disease status post CABG Continue statin. Anticoagulated.    Chronic diastolic CHF, NYHA class II Compensated at this time.    History of complete heart block status post pacemaker Records reviewed, followed by Dr. Lovena Le who reports his medtronic device is programmed VVIR. 9 years on the battery.    Aortic stenosis status post aortic valve replacement Goal INR 2.5 (2.32 today).    Chronic a-fib on Coumadin Currently on IV heparin/Coumadin until INR 2.5.    Essential hypertension Not currently on antihypertensive therapies. Blood pressure not elevated.    CRBSI (catheter-related bloodstream infection), subsequent encounter/history of bacterial endocarditis Now on Teflaro. Monitor for fevers. If he has recurrent evidence of infection, may need to have his pacemaker removed.  Continue Teflaro. For TEE to rule out recurrent endocarditis. Spoke with Dr. Baxter Flattery about the patient's history of present illness and she will see him and make further recommendations.    Normocytic anemia Suspect anemia of chronic disease. No current indication for transfusion.    Thrombocytopenia Question related to blood thinners or possibly smoldering infection. Monitor closely and check HIT panel if platelet count drops further.   Family Communication/Anticipated D/C date and plan/Code Status   DVT prophylaxis: Heparin/Coumadin ordered. Code Status: Full Code.  Family Communication: Granddaughter and daughter at bedside. Disposition Plan: Home when work up completed, plan of care fully defined.   Medical Consultants:    Neurology  Cardiology  Infectious Disease   Procedures:   2-D echo 12/30/15:  Study Conclusions  - Left ventricle:  The cavity size was normal. Wall thickness was   increased in a pattern of moderate LVH. Systolic function was   mildly to  moderately reduced. The estimated ejection fraction was   in the range of 40% to 45%. - Aortic valve: There was moderate stenosis. Valve area (VTI): 1.19   cm^2. Valve area (Vmax): 1.15 cm^2. Valve area (Vmean): 1.11   cm^2. - Mitral valve: Moderately calcified annulus. There was moderate to   severe regurgitation. - Left atrium: The atrium was massively dilated. - Right atrium: The atrium was moderately dilated.  Right carotid duplex 12/30/15: Preliminary report:  1-39% right ICA plaquing.  Bilateral vertebral artery flow is antegrade.  Unable to scan left carotid secondary to patient's inability to keep head turned.  Anti-Infectives:    Levaquin 12/30/15--->12/31/15  Teflaro 12/31/15--->  Subjective:   The patient is a little more sleepy today.  His speech remains fluent and he is following commands.  Denies dyspnea/cough, but his granddaughter says he had an episode of dyspnea overnight.  No fever/chills. No chest pain. Continues to complain of left knee pain.  Objective:    Vitals:   12/31/15 1428 12/31/15 2143 01/01/16 0212 01/01/16 0555  BP: (!) 153/99 130/77 (!) 143/82 (!) 163/82  Pulse: 96 80 61 83  Resp: 19 18 16 18   Temp: 97.5 F (36.4 C) 98.6 F (37 C) 98.3 F (36.8 C) 98.6 F (37 C)  TempSrc: Oral Oral Oral Oral  SpO2: 99% 97% 98% 99%  Weight:      Height:        Intake/Output Summary (Last 24 hours) at 01/01/16 0757 Last data filed at 01/01/16 0346  Gross per 24 hour  Intake           765.64 ml  Output                0 ml  Net           765.64 ml   Filed Weights   12/30/15 0005  Weight: 67.3 kg (148 lb 5.9 oz)    Exam: General exam: No distress, sitting up in chair. Respiratory system: Clear to auscultation. Respiratory effort normal. Cardiovascular system: HSIR. No JVD,  rubs, gallops or clicks. III/VI murmur. Gastrointestinal system: Abdomen is nondistended, soft and nontender. No organomegaly or masses felt. Normal bowel sounds  heard. Central nervous system: Slightly sleepy today, oriented x 2. Speech clear/coherent/fluent. No focal deficits. Extremities: No clubbing,  or cyanosis. 1+ edema. Skin: No rashes, lesions or ulcers. Psychiatry: Judgement and insight appear normal. Mood & affect appropriate.   Data Reviewed:   I have personally reviewed following labs and imaging studies:  Labs: Basic Metabolic Panel:  Recent Labs Lab 12/31/15 0246  NA 136  K 3.5  CL 104  CO2 22  GLUCOSE 99  BUN 14  CREATININE 1.29*  CALCIUM 8.5*   GFR Estimated Creatinine Clearance: 35.5 mL/min (by C-G formula based on SCr of 1.29 mg/dL (H)). Liver Function Tests: No results for input(s): AST, ALT, ALKPHOS, BILITOT, PROT, ALBUMIN in the last 168 hours. No results for input(s): LIPASE, AMYLASE in the last 168 hours. No results for input(s): AMMONIA in the last 168 hours. Coagulation profile  Recent Labs Lab 12/30/15 0508 12/31/15 0246 01/01/16 0509  INR 1.95 1.75 2.32    CBC:  Recent Labs Lab 12/30/15 0508 12/31/15 0246  WBC 5.5 4.8  HGB 8.3* 8.8*  HCT 26.7* 28.3*  MCV 92.4 92.8  PLT 98* 94*   CBG:  Recent Labs Lab 12/30/15 0042 12/30/15 1224  GLUCAP 103* 112*   Hgb A1c:  Recent Labs  12/30/15 0508  HGBA1C 5.2   Lipid Profile:  Recent Labs  12/30/15 0508  CHOL 89  HDL 20*  LDLCALC 53  TRIG 79  CHOLHDL 4.5    Microbiology Recent Results (from the past 240 hour(s))  Culture, blood (routine x 2)     Status: None (Preliminary result)   Collection Time: 12/29/15 11:20 PM  Result Value Ref Range Status   Specimen Description BLOOD RIGHT ARM  Final   Special Requests BOTTLES DRAWN AEROBIC AND ANAEROBIC 5ML  Final   Culture NO GROWTH 1 DAY  Final   Report Status PENDING  Incomplete  Culture, blood (routine x 2)     Status: None (Preliminary result)   Collection Time: 12/29/15 11:26 PM  Result Value Ref Range Status   Specimen Description BLOOD RIGHT ARM  Final   Special Requests  BOTTLES DRAWN AEROBIC AND ANAEROBIC 5ML  Final   Culture NO GROWTH 1 DAY  Final   Report Status PENDING  Incomplete    Radiology: Dg Chest 2 View  Result Date: 12/30/2015 CLINICAL DATA:  Per RN notes as pt cannot provide info: [place] daughter called RN to come to room for "patient decline" (RN completed assessment 25 minutes prior, no new symptoms, per patient daughter patient remains same all day). EXAM: CHEST  2 VIEW COMPARISON:  03/04/2015 FINDINGS: Left-sided transvenous pacemaker lead to the right ventricle. Right-sided PICC line has been removed since the prior study. Status post median sternotomy, CABG, and valve replacement. The heart is enlarged. Calcified granulomata are present. There are small bilateral pleural effusions. There is minimal atelectasis or possible early infiltrate at the left lung base. IMPRESSION: 1. Cardiomegaly. 2. Left lower lobe atelectasis or early infiltrate. Electronically Signed   By: Nolon Nations M.D.   On: 12/30/2015 23:00   Dg Knee Complete 4 Views Left  Result Date: 12/31/2015 CLINICAL DATA:  Left knee pain after falling 10 days ago. Stroke-like symptoms for 2 days. History of total knee arthroplasty in 2014. EXAM: LEFT KNEE - COMPLETE 4+ VIEW COMPARISON:  None. FINDINGS: The bones appear mildly demineralized. Patient is status post left total knee arthroplasty. The hardware is intact and well positioned. No evidence of acute fracture, dislocation or loosening. There is a small knee joint effusion with increased density in Hoffa's fat. There are diffuse vascular calcifications. IMPRESSION: No acute osseous findings or evidence of hardware loosening status post total knee arthroplasty. Knee joint effusion with increased density in Hoffa's fat. Electronically Signed   By: Richardean Sale M.D.   On: 12/31/2015 20:01    Medications:   . ceFTAROline (TEFLARO) IV  400 mg Intravenous Q12H  . ferrous sulfate  325 mg Oral Q breakfast  . folic acid  1 mg Oral  Daily  . predniSONE  5 mg Oral Q breakfast  . rOPINIRole  2 mg Oral Daily  . simvastatin  20 mg Oral QHS  . Warfarin - Pharmacist Dosing Inpatient   Does not apply q1800   Continuous Infusions: . heparin 1,250 Units/hr (12/31/15 2345)    Medical decision making is of high complexity and this patient is at high risk of deterioration, therefore this is a level 3 visit.     LOS: 3 days   [facility] Pager [phone]. If unable to reach me by pager, please call my cell phone at [phone].  *Please refer to [email or web address],  password [email or web address] to get updated schedule on who will round on this patient, as hospitalists switch teams weekly. If 7PM-7AM, please contact night-coverage at [email or web address], password [email or web address] for any overnight needs.  01/01/2016, 7:57 AM

## 2016-01-01 NOTE — Consult Note (Addendum)
[facility]  Total days of antibiotics 4        Day 2 ceftaroline               Reason for Consult: recurrent bacteremia/endocarditis   Referring Physician: rama  Principal Problem:   Acute ischemic stroke ([place]) Active Problems:   Normocytic anemia   Chronic a-fib on Coumadin   CAD (coronary artery disease) s/p CABG   Essential hypertension   Chronic diastolic heart failure ([place])   Long term current use of anticoagulant therapy   Pacemaker   CRBSI (catheter-related bloodstream infection), subsequent encounter   History of endocarditis   Acute CVA (cerebrovascular accident) ([place])   Left knee pain   Thrombocytopenia ([place])    HPI: Ethan Lawrence is a 82 y.o. male with hx of AS s/p AVR in 2014 c/b CHB requiring PM, also hx of CAD s/p 4 vessel CABG with EF 50%, chronic afib,hx of Abdominal aortic aneurysm post aortoiliac stent grafts, hx of bilateral shoulder implant and left TKA. 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 He was treated for 8 wk of ceftriaxone for presumed discitis thru mid February. He had his picc line removed by [facility] surgeon of Mar 9th . He was doing well roughly for 4-6 wk but family reports having fevers in late April, again having bacteremia where he was treated for 6 wks ? Again with abtx (unclear which abtx or pathogen)- which would be his 2nd course of antibiotics and had his picc line removed after the course of tx. Then in August, he again had recurrent fevers and bacteremia for which he was treated with abtx for strep constellatus, by micro reports, which looks like he had persistent bacteremia for 1 week. He was treated for  6wk though this time, he was placed on chronic suppression with amox/clav, however his picc line was kept in place for unclear reasons though he  had finished his IV therapy (noted in his cardiology visit Dec 06 2015)  He was doing well until mid October, where he started to feel poorly with low grade fevers and some urinary urgency. He was started on treatment for presumed urinary tract infection but fevers, rigors worsened where he was take to local hospital where they found him to be bacteremic with strep species on 10/15 and 10/17. He still had retained picc line from august admission. That was removed, cath tip showed + yeast on 10/16, but it was not isolated in any other cultures. 10/18 blood cx had ngtd. He was discharged from outside hospital but then <readmitted within 48hr for new onset slurred speech, possibly facial droop in the setting of BS 40s. He was admitted for rule out stroke and transferred to [facility] for further eval concerning for embolic phenomenon. The patient's family report he is improved and neuro symptom resolved somewhat with BS in normal range plus antibiotics ( he received ceftaroline plus levofloxacin). He still has difficulty answering questions ? Possible aphasia  Family did not follow up in the ID clinic back in February after his hospitalization. Family is wondering why he has repeated bacteremia and retained nidus of infection  He fell on his prosthetic left knee which is exquisitely painful to him, mainly swollen.his daughter is worried about his onychomycosis of toe nails and tinea corporis as potential  source of infection.  He does not have any dysuria or frequent urination at this time ,but it appears that he does get treated for recurrent utis.  Past Medical History:  Diagnosis Date  . AAA (abdominal aortic aneurysm) ([place])    a. 06/2010 s/p Endovascular AAA Repair  . Acute endocarditis 02/28/2015  . Anemia requiring transfusions    MOST RECENT WAS JUNE 2014 WITH HEART SURGERY  . Aortic stenosis    a. 08/2012 TEE: EF 45-55%, critical AS, Triv AI, mild to mod MR, Sev TR;  b. 08/2012 AVR (104m Magna Ease  pericardial tissue valve) & TV Repair (339mMC3 annuloplasty ring).  . Arthritis    LEFT KNEE OA AND PAIN;  DJD LUMBAR  . Atrial fibrillation ([place]  . Blood transfusion without reported diagnosis   . CAD (coronary artery disease)    a. 05/2004 CABGx4 (LIMA->LAD, VG->RI, VG->OM, VG->RCA);  b. 08/2012 Cath: Native 3VD with 3/4 patent grafts (VG->RI occluded).  . Cataract   . CHF (congestive heart failure) ([place]  . Complete heart block, post-surgical ([place]   s/p MDT pacemaker implant by Dr TaLovena Le. Complication of anesthesia    STATES HE DOES WELL WITH DIPROVAN  . GI bleed 06/18/2011  . Hyperlipidemia   . Hypertension   . Macular degeneration   . Pacemaker   . [place]emaker infection ([place]  . Pain    PT HAS A LOT OF LOWER BACK PAIN- PREVIOUS BACK SURGERY AND DJD  . Streptococcus viridans infection 02/28/2015    Allergies:  Allergies  Allergen Reactions  . Tape Other (See Comments)    Pulls skin off  . Vancomycin Other (See Comments)    Hearing loss   MEDICATIONS: . ceFTAROline (TEFLARO) IV  400 mg Intravenous Q12H  . ferrous sulfate  325 mg Oral Q breakfast  . folic acid  1 mg Oral Daily  . predniSONE  5 mg Oral Q breakfast  . rOPINIRole  2 mg Oral Daily  . simvastatin  20 mg Oral QHS  . Warfarin - Pharmacist Dosing Inpatient   Does not apply q1800    Social History  Substance Use Topics  . Smoking status: Former Smoker    Quit date: 10/29/1975  . Smokeless tobacco: Never Used  . Alcohol use No    Family History  Problem Relation Age of Onset  . Coronary artery disease Brother   . Healthy Mother   . Emphysema Father   . Healthy Sister   . Alzheimer's disease Brother    Review of Systems  Constitutional: + chills. Negative for fever,  diaphoresis, activity change, appetite change, fatigue and unexpected weight change.  HENT: Negative for congestion, sore throat, rhinorrhea, sneezing, trouble swallowing and sinus pressure.  Eyes: Negative for photophobia and  visual disturbance.  Respiratory: Negative for cough, chest tightness, shortness of breath, wheezing and stridor.  Cardiovascular: Negative for chest pain, palpitations and leg swelling.  Gastrointestinal: Negative for nausea, vomiting, abdominal pain, diarrhea, constipation, blood in stool, abdominal distention and anal bleeding.  Genitourinary: Negative for dysuria, hematuria, flank pain and difficulty urinating.  Musculoskeletal:+ left knee swelling and pain. Negative for myalgias, back pain, joint swelling, arthralgias and gait problem.  Skin: Negative for color change, pallor, rash and wound.  Neurological: Negative for dizziness, tremors, weakness and light-headedness.  Hematological: Negative for adenopathy. Does not bruise/bleed easily.  Psychiatric/Behavioral: Negative for behavioral problems, confusion, sleep disturbance, dysphoric mood, decreased concentration and agitation.     OBJECTIVE: Temp:  [  98.2 F (36.8 C)-99.1 F (37.3 C)] 99.1 F (37.3 C) (10/23 2048) Pulse Rate:  [61-92] 92 (10/23 2048) Resp:  [16-18] 16 (10/23 2048) BP: (108-163)/(66-90) 127/66 (10/23 2048) SpO2:  [98 %-99 %] 98 % (10/23 2048) Physical Exam  Constitutional: He is oriented to person, place, and time. He appears well-developed and well-nourished. No distress.  HENT:  Mouth/Throat: Oropharynx is clear and moist. No oropharyngeal exudate.  Cardiovascular: Normal rate, regular rhythm and normal heart sounds. Exam reveals no gallop and no friction rub. 3/6 murmur BH at apex Pulmonary/Chest: Effort normal and breath sounds normal. No respiratory distress. He has no wheezes.  Abdominal: Soft. Bowel sounds are normal. He exhibits no distension. There is no tenderness.  Lymphadenopathy:  He has no cervical adenopathy.  Neurological: He is alert and oriented to person, place, and time. He is able to move is upper extremities nad shoulders without difficulty with full range of motion. Lower extremity  strength of left knee is limited due to swelling and pain to joint Skin: petechial rash to left leg. Also has 3 quarter sized lesions c/w tinea corporis. His toenails has signs of onychomyocisis Psychiatric: He has a normal mood and affect. His behavior is normal.     LABS: Results for orders placed or performed during the hospital encounter of 12/29/15 (from the past 48 hour(s))  CBC     Status: Abnormal   Collection Time: 12/31/15  2:46 AM  Result Value Ref Range   WBC 4.8 4.0 - 10.5 K/uL   RBC 3.05 (L) 4.22 - 5.81 MIL/uL   Hemoglobin 8.8 (L) 13.0 - 17.0 g/dL   HCT 28.3 (L) 39.0 - 52.0 %   MCV 92.8 78.0 - 100.0 fL   MCH 28.9 26.0 - 34.0 pg   MCHC 31.1 30.0 - 36.0 g/dL   RDW 17.2 (H) 11.5 - 15.5 %   Platelets 94 (L) 150 - 400 K/uL    Comment: CONSISTENT WITH PREVIOUS RESULT  Basic metabolic panel     Status: Abnormal   Collection Time: 12/31/15  2:46 AM  Result Value Ref Range   Sodium 136 135 - 145 mmol/L   Potassium 3.5 3.5 - 5.1 mmol/L   Chloride 104 101 - 111 mmol/L   CO2 22 22 - 32 mmol/L   Glucose, Bld 99 65 - 99 mg/dL   BUN 14 6 - 20 mg/dL   Creatinine, Ser 1.29 (H) 0.61 - 1.24 mg/dL   Calcium 8.5 (L) 8.9 - 10.3 mg/dL   GFR calc non Af Amer 47 (L) >60 mL/min   GFR calc Af Amer 54 (L) >60 mL/min    Comment: (NOTE) The eGFR has been calculated using the CKD EPI equation. This calculation has not been validated in all clinical situations. eGFR's persistently <60 mL/min signify possible Chronic Kidney Disease.    Anion gap 10 5 - 15  Protime-INR     Status: Abnormal   Collection Time: 12/31/15  2:46 AM  Result Value Ref Range   Prothrombin Time 20.7 (H) 11.4 - 15.2 seconds   INR 1.75   Heparin level (unfractionated)     Status: Abnormal   Collection Time: 12/31/15  8:40 AM  Result Value Ref Range   Heparin Unfractionated 0.25 (L) 0.30 - 0.70 IU/mL    Comment:        IF HEPARIN RESULTS ARE BELOW EXPECTED VALUES, AND PATIENT DOSAGE HAS BEEN CONFIRMED, SUGGEST  FOLLOW UP TESTING OF ANTITHROMBIN III LEVELS.   Heparin level (unfractionated)  Status: None   Collection Time: 12/31/15  7:20 PM  Result Value Ref Range   Heparin Unfractionated 0.34 0.30 - 0.70 IU/mL    Comment:        IF HEPARIN RESULTS ARE BELOW EXPECTED VALUES, AND PATIENT DOSAGE HAS BEEN CONFIRMED, SUGGEST FOLLOW UP TESTING OF ANTITHROMBIN III LEVELS.   CBC     Status: Abnormal   Collection Time: 01/01/16  5:00 AM  Result Value Ref Range   WBC 5.2 4.0 - 10.5 K/uL   RBC 3.18 (L) 4.22 - 5.81 MIL/uL   Hemoglobin 9.1 (L) 13.0 - 17.0 g/dL   HCT 29.4 (L) 39.0 - 52.0 %   MCV 92.5 78.0 - 100.0 fL   MCH 28.6 26.0 - 34.0 pg   MCHC 31.0 30.0 - 36.0 g/dL   RDW 17.3 (H) 11.5 - 15.5 %   Platelets 92 (L) 150 - 400 K/uL    Comment: CONSISTENT WITH PREVIOUS RESULT  Sedimentation rate     Status: None   Collection Time: 01/01/16  5:00 AM  Result Value Ref Range   Sed Rate 2 0 - 16 mm/hr  Basic metabolic panel     Status: Abnormal   Collection Time: 01/01/16  5:00 AM  Result Value Ref Range   Sodium 137 135 - 145 mmol/L   Potassium 3.7 3.5 - 5.1 mmol/L   Chloride 105 101 - 111 mmol/L   CO2 25 22 - 32 mmol/L   Glucose, Bld 145 (H) 65 - 99 mg/dL   BUN 12 6 - 20 mg/dL   Creatinine, Ser 1.37 (H) 0.61 - 1.24 mg/dL   Calcium 8.9 8.9 - 10.3 mg/dL   GFR calc non Af Amer 43 (L) >60 mL/min   GFR calc Af Amer 50 (L) >60 mL/min    Comment: (NOTE) The eGFR has been calculated using the CKD EPI equation. This calculation has not been validated in all clinical situations. eGFR's persistently <60 mL/min signify possible Chronic Kidney Disease.    Anion gap 7 5 - 15  Heparin level (unfractionated)     Status: None   Collection Time: 01/01/16  5:09 AM  Result Value Ref Range   Heparin Unfractionated 0.32 0.30 - 0.70 IU/mL    Comment:        IF HEPARIN RESULTS ARE BELOW EXPECTED VALUES, AND PATIENT DOSAGE HAS BEEN CONFIRMED, SUGGEST FOLLOW UP TESTING OF ANTITHROMBIN III LEVELS.     Protime-INR     Status: Abnormal   Collection Time: 01/01/16  5:09 AM  Result Value Ref Range   Prothrombin Time 25.9 (H) 11.4 - 15.2 seconds   INR 2.32     MICRO: 10/20 blood cx pending --outside labs----------- 10/15 Blood Cx: Strep species (microbiology lab cannot ID, currently being sent to [facility] for identification) 95/09 Urine Cx: Normal flora 10/16 PICC Cath Tip: Yeast (was not identified, requires send out lab and their microbiology lab did not have an order to ID) 10/17 Blood Cx (1/2): Strep species (being sent to [facility] for identification) 32/67 Blood Cx: No growth to date  Outside/historic micro: 8/15 Blood Cx: Strep constellatus 8/21 Blood Cx: Strep constellatus 8/23 Blood Cx: Strep constellatus 8/24 Abdominal Fluid Cx: No growth final 8/28 Blood Cx: No growth final    IMAGING: Dg Knee Complete 4 Views Left  Result Date: 12/31/2015 CLINICAL DATA:  Left knee pain after falling 10 days ago. Stroke-like symptoms for 2 days. History of total knee arthroplasty in 2014. EXAM: LEFT KNEE - COMPLETE 4+ VIEW  COMPARISON:  None. FINDINGS: The bones appear mildly demineralized. Patient is status post left total knee arthroplasty. The hardware is intact and well positioned. No evidence of acute fracture, dislocation or loosening. There is a small knee joint effusion with increased density in Hoffa's fat. There are diffuse vascular calcifications. IMPRESSION: No acute osseous findings or evidence of hardware loosening status post total knee arthroplasty. Knee joint effusion with increased density in Hoffa's fat. Electronically Signed   By: Richardean Sale M.D.   On: 12/31/2015 20:01   Assessment/Plan:  44 M with complicated cardiac history including AVR and TR  With hx of AV PVE and MV NVE in Dec-January, treated for presumed discitis. He had recurrence in April, in August and presently. He had retained PICC from end for roughly 2-3 wk from finishing course of abtx in late Sept to  when he presented to OSH in mid Oct for fever and found to have recurrent strep bacteremia (species pending) plus possible picc line infection with yeast. His presentation of TIA/stroke is concerning for recurrent PVE.  Recurrent strep bacteremia - will need to find nidus of infection - agree with repeat TEE on 10/24 - if TEE is negative, will need to get tagged WBC in particular looking at his prosthesis vs. His history of aorta-iliac stents, to see if there is uptake at these sights - can switch ceftaroline to ceftriaxone 2gm Iv daily until more ID coming from outside hospital cutlures from 10/15.  - ? picc line infection = will start him on anidulafungin, and will decide how to proceed based upon repeat blood cultures. anidulafungin will have less drug interaction that fluconazole with warfarin for now.    - joint effusion = likely secondary to trauma. recommend to aspirate for relief and send fluid for cell count and culture  - TIA = concern that this is related to embolic phenomenon. Await results from TEE to see if more insight in process.  - urinary frequency = resolved, no need for nitrofurantoin  - tinea corporis = can use topical antifungal, clotrimazole 1% bid on affected areas  - onychomycosis = will address closer to discharge for now, it does not appear he has any surrounding erythema suggesting cellulitis  Will need to ask for hospital records from April admission and august admission to see what they were treating.  Will provide further recs as more information becomes available  Caren Griffins B. [place] for Infectious Diseases [phone]   -

## 2016-01-01 NOTE — Progress Notes (Signed)
Pharmacy Note - Microbiology Data  Providence LaniusCharles E Laidlaw is a 82 y.o. male admitted on 12/29/2015 after [facility]. Patient and family report having had cultures collected at this previous institution. I have contacted [facility] [place] for more information on the patient's prior microbiology history. See below for culture history:  8/15 Blood Cx: Strep constellatus 8/21 Blood Cx: Strep constellatus 8/23 Blood Cx: Strep constellatus 8/24 Abdominal Fluid Cx: No growth final 8/28 Blood Cx: No growth final  10/15 Blood Cx: Strep species (microbiology lab cannot ID, [facility]) 10/15 Urine Cx: Normal flora 10/16 PICC Cath Tip: Yeast (was not identified, requires send out lab and their microbiology lab did not have an order to ID) 10/17 Blood Cx (1/2): Strep species (being sent to [facility] for identification) 10/18 Blood Cx: No growth to date  Casilda Carlsaylor Kadarius Cuffe, PharmD. PGY-2 Infectious Diseases Pharmacy Resident Pager: [phone] 01/01/2016, 5:02 PM

## 2016-01-02 ENCOUNTER — Encounter (HOSPITAL_COMMUNITY): Payer: Self-pay

## 2016-01-02 ENCOUNTER — Inpatient Hospital Stay (HOSPITAL_COMMUNITY): Payer: Medicare PPO

## 2016-01-02 ENCOUNTER — Encounter (HOSPITAL_COMMUNITY)
Admission: AD | Disposition: A | Discharge status: Skilled Nursing Facility | Payer: Self-pay | Source: Other Acute Inpatient Hospital | Attending: Internal Medicine

## 2016-01-02 DIAGNOSIS — R4182 Altered mental status, unspecified: Secondary | ICD-10-CM

## 2016-01-02 DIAGNOSIS — R350 Frequency of micturition: Secondary | ICD-10-CM

## 2016-01-02 DIAGNOSIS — I340 Nonrheumatic mitral (valve) insufficiency: Secondary | ICD-10-CM

## 2016-01-02 DIAGNOSIS — I482 Chronic atrial fibrillation: Secondary | ICD-10-CM

## 2016-01-02 DIAGNOSIS — R3915 Urgency of urination: Secondary | ICD-10-CM

## 2016-01-02 DIAGNOSIS — Z950 Presence of cardiac pacemaker: Secondary | ICD-10-CM

## 2016-01-02 HISTORY — PX: TEE WITHOUT CARDIOVERSION: SHX5443

## 2016-01-02 LAB — SYNOVIAL CELL COUNT + DIFF, W/ CRYSTALS
Crystals, Fluid: NONE SEEN
EOSINOPHILS-SYNOVIAL: 0 % (ref 0–1)
LYMPHOCYTES-SYNOVIAL FLD: 14 % (ref 0–20)
Monocyte-Macrophage-Synovial Fluid: 4 % — ABNORMAL LOW (ref 50–90)
NEUTROPHIL, SYNOVIAL: 82 % — AB (ref 0–25)
WBC, SYNOVIAL: UNDETERMINED /mm3 (ref 0–200)

## 2016-01-02 LAB — BASIC METABOLIC PANEL
ANION GAP: 8 (ref 5–15)
BUN: 12 mg/dL (ref 6–20)
CO2: 25 mmol/L (ref 22–32)
Calcium: 8.7 mg/dL — ABNORMAL LOW (ref 8.9–10.3)
Chloride: 102 mmol/L (ref 101–111)
Creatinine, Ser: 1.47 mg/dL — ABNORMAL HIGH (ref 0.61–1.24)
GFR calc Af Amer: 46 mL/min — ABNORMAL LOW (ref >60)
GFR, EST NON AFRICAN AMERICAN: 40 mL/min — AB (ref >60)
Glucose, Bld: 97 mg/dL (ref 65–99)
POTASSIUM: 3.5 mmol/L (ref 3.5–5.1)
SODIUM: 135 mmol/L (ref 135–145)

## 2016-01-02 LAB — PROTIME-INR
INR: 3.31
PROTHROMBIN TIME: 34.4 s — AB (ref 11.4–15.2)

## 2016-01-02 LAB — CBC
HCT: 27.6 % — ABNORMAL LOW (ref 39.0–52.0)
HEMOGLOBIN: 8.6 g/dL — AB (ref 13.0–17.0)
MCH: 29 pg (ref 26.0–34.0)
MCHC: 31.2 g/dL (ref 30.0–36.0)
MCV: 92.9 fL (ref 78.0–100.0)
Platelets: 77 10*3/uL — ABNORMAL LOW (ref 150–400)
RBC: 2.97 MIL/uL — AB (ref 4.22–5.81)
RDW: 17.6 % — ABNORMAL HIGH (ref 11.5–15.5)
WBC: 4.7 10*3/uL (ref 4.0–10.5)

## 2016-01-02 LAB — HEPARIN LEVEL (UNFRACTIONATED): HEPARIN UNFRACTIONATED: 0.39 [IU]/mL (ref 0.30–0.70)

## 2016-01-02 SURGERY — ECHOCARDIOGRAM, TRANSESOPHAGEAL
Anesthesia: Moderate Sedation

## 2016-01-02 MED ORDER — LORAZEPAM 2 MG/ML IJ SOLN
0.5000 mg | Freq: Once | INTRAMUSCULAR | Status: AC
  Administered 2016-01-02: 0.5 mg via INTRAVENOUS
  Filled 2016-01-02: qty 1

## 2016-01-02 MED ORDER — DIPHENHYDRAMINE HCL 50 MG/ML IJ SOLN
INTRAMUSCULAR | Status: AC
  Filled 2016-01-02: qty 1

## 2016-01-02 MED ORDER — FENTANYL CITRATE (PF) 100 MCG/2ML IJ SOLN
INTRAMUSCULAR | Status: DC | PRN
  Administered 2016-01-02: 12.5 ug via INTRAVENOUS

## 2016-01-02 MED ORDER — MIDAZOLAM HCL 10 MG/2ML IJ SOLN
INTRAMUSCULAR | Status: DC | PRN
  Administered 2016-01-02: 1 mg via INTRAVENOUS
  Administered 2016-01-02: .5 mg via INTRAVENOUS

## 2016-01-02 MED ORDER — MORPHINE SULFATE (PF) 4 MG/ML IV SOLN
4.0000 mg | Freq: Once | INTRAVENOUS | Status: AC
  Administered 2016-01-02: 4 mg via INTRAVENOUS
  Filled 2016-01-02: qty 1

## 2016-01-02 MED ORDER — FENTANYL CITRATE (PF) 100 MCG/2ML IJ SOLN
INTRAMUSCULAR | Status: AC
  Filled 2016-01-02: qty 2

## 2016-01-02 MED ORDER — MIDAZOLAM HCL 5 MG/ML IJ SOLN
INTRAMUSCULAR | Status: AC
  Filled 2016-01-02: qty 2

## 2016-01-02 MED ORDER — LIDOCAINE HCL (PF) 1 % IJ SOLN
5.0000 mL | Freq: Once | INTRAMUSCULAR | Status: AC
  Administered 2016-01-02: 5 mL via INTRADERMAL
  Filled 2016-01-02: qty 5

## 2016-01-02 NOTE — Progress Notes (Signed)
   Patient had TEE which looks like his native and prosthetic valves do not have any vegetations. Has small PFO. For work up of his recurrent bacteremia,  Recommend to get CT-A of abdomen to look at this aorto-iliac grafts and tagged WBC scan

## 2016-01-02 NOTE — Progress Notes (Signed)
PT Cancellation Note  Patient Details Name: Ethan Lawrence MRN: [id number] DOB: Sep 01, 1922   Cancelled Treatment:    Reason Eval/Treat Not Completed: Other (comment). Pt's granddaughter asked PT to hold at this time due to pt being in excrutiating pain all night and not sleeping. Pt just received morphine and is finally asleep. Pt planned to go for TEE/Loop recorder placement and Dr. Charlann Boxerlin panning on draining fluid of L knee today as well. PT to re-assess pt tomorrow (10/25) as able.   Marcene BrawnChadwell, Jamile Sivils Marie 01/02/2016, 11:01 AM   Lewis ShockAshly Michell Giuliano, PT, DPT Pager #: [phone] Office #: [phone]

## 2016-01-02 NOTE — Discharge Instructions (Signed)

## 2016-01-02 NOTE — Progress Notes (Signed)
notifed dr Anne Fuskains that patient was having frequent PVCs and had several runs of 4 to 5 beats of VTach; no new orders given

## 2016-01-02 NOTE — CV Procedure (Signed)
    Transesophageal echocardiogram  Indication: Bacteremia  Findings: No vegetations   - Aortic valve replacement  - Moderate mitral regurgitation  - Pacemaker lead without vegetation  - Small patent foramen ovale, PFO by color Doppler  - Normal ejection fraction Please see full report for details  Donato SchultzMark Presley Gora, MD

## 2016-01-02 NOTE — Progress Notes (Signed)
ANTICOAGULATION CONSULT NOTE  Pharmacy Consult for Warfarin Indication: afib with bioprosthetic AVR; likely new embolic event  Assessment: 82 y.o. M on warfarin PTA for afib and h/o bioprosthetic AVR with goal INR 2-3. Neuro wants new goal INR 2.5-3 as likely embolic phenomena.   PTA Warfarin Dose: 7.5mg  Wed and 5mg  AODs with last dose 10/20 (INR on admission = 2)  INR 3.31 up from 2.32. Will hold dose today x 1. Pt received one dose of Levaquin 500 mg on 10/22. Patient was having nosebleeds, but that they are easy to control. No report of increase or new bleeding.  Goal of Therapy:  INR 2.5-3 per neuro Monitor platelets by anticoagulation protocol: Yes   Plan:  Hold warfarin x 1 dose. Monitor daily INR, CBC, clinical course, s/sx of bleed, PO intake, DDI  Allergies  Allergen Reactions  . Tape Other (See Comments)    Pulls skin off  . Vancomycin Other (See Comments)    Hearing loss    Patient Measurements: Height: 6' (182.9 cm) Weight: 148 lb 5.9 oz (67.3 kg) IBW/kg (Calculated) : 77.6  Heparin Dosing Weight: 70 kg  Vital Signs: Temp: 98.7 F (37.1 C) (10/24 0119) Temp Source: Oral (10/24 0119) BP: 149/96 (10/24 0645) Pulse Rate: 79 (10/24 0645)  Labs:  Recent Labs  12/31/15 0246  12/31/15 1920 01/01/16 0500 01/01/16 0509 01/02/16 0357  HGB 8.8*  --   --  9.1*  --  8.6*  HCT 28.3*  --   --  29.4*  --  27.6*  PLT 94*  --   --  92*  --  77*  LABPROT 20.7*  --   --   --  25.9* 34.4*  INR 1.75  --   --   --  2.32 3.31  HEPARINUNFRC  --   < > 0.34  --  0.32 0.39  CREATININE 1.29*  --   --  1.37*  --  1.47*  < > = values in this interval not displayed.  Estimated Creatinine Clearance: 31.2 mL/min (by C-G formula based on SCr of 1.47 mg/dL (H)).   Thank you for allowing us to participate in this patients care. Signe Coltonya C Jaretzi Droz, PharmD Pager: [phone] 01/02/2016 8:48 AM

## 2016-01-02 NOTE — Consult Note (Signed)
Reason for Consult:Left knee effusion Referring Physician: Dr.Rama  Ethan Lawrence is an 82 y.o. male.  HPI: Patient was transferred to [facility] for further evaluation following a fall Friday morning. He currently lives in [place], but his daughter lives here in [place]. He was admitted for further evaluation, after being evaluated at his local hospital. He was found to be septic there. We were consulted for a left knee effusion following a fall. Questioning the source of his sepsis. History of left TKA by Dr. Alvan Dame years ago and has done well. Denies CP, SOB, or syncope. Extensive medical and surgery history below. On Coumadin for anticoagulation.   Past Medical History:  Diagnosis Date  . AAA (abdominal aortic aneurysm) ([place])    a. 06/2010 s/p Endovascular AAA Repair  . Acute endocarditis 02/28/2015  . Anemia requiring transfusions    MOST RECENT WAS JUNE 2014 WITH HEART SURGERY  . Aortic stenosis    a. 08/2012 TEE: EF 45-55%, critical AS, Triv AI, mild to mod MR, Sev TR;  b. 08/2012 AVR (16m Magna Ease pericardial tissue valve) & TV Repair (385mMC3 annuloplasty ring).  . Arthritis    LEFT KNEE OA AND PAIN;  DJD LUMBAR  . Atrial fibrillation ([facility]  . Blood transfusion without reported diagnosis   . CAD (coronary artery disease)    a. 05/2004 CABGx4 (LIMA->LAD, VG->RI, VG->OM, VG->RCA);  b. 08/2012 Cath: Native 3VD with 3/4 patent grafts (VG->RI occluded).  . Cataract   . CHF (congestive heart failure) ([place]  . Complete heart block, post-surgical ([facility]6/[place]   s/p MDT pacemaker implant by Dr TaLovena Le. Complication of anesthesia    STATES HE DOES WELL WITH DIPROVAN  . GI bleed 06/18/2011  . Hyperlipidemia   . Hypertension   . Macular degeneration   . Pacemaker   . Pacemaker infection ([place]  . Pain    PT HAS A LOT OF LOWER BACK PAIN- PREVIOUS BACK SURGERY AND DJD  . Streptococcus viridans infection 02/28/2015    Past Surgical History:  Procedure Laterality Date  .  AORTIC VALVE REPLACEMENT N/A 09/01/2012   Procedure: REDO AORTIC VALVE REPLACEMENT (AVR);  Surgeon: PeIvin PootMD;  [facility] Service: Open Heart Surgery;  Laterality: N/A;  . APPENDECTOMY    . BACK SURGERY    . CARDIAC CATHETERIZATION    . CARDIAC VALVE REPLACEMENT    . COLONOSCOPY  06/19/2011   Procedure: COLONOSCOPY;  Surgeon: SaWonda HornerMD;  [place] [facility] Service: Gastroenterology;  Laterality: N/A;  . CORONARY ARTERY BYPASS GRAFT    . ENDOVASCULAR STENT INSERTION  07/05/2010  . ESOPHAGOGASTRODUODENOSCOPY  06/19/2011   Procedure: ESOPHAGOGASTRODUODENOSCOPY (EGD);  Surgeon: SaWonda HornerMD;  [place] [facility] Service: Gastroenterology;  Laterality: N/A;  . EYE SURGERY     bilater cataracts removed  . HERNIA REPAIR    . INTRAOPERATIVE TRANSESOPHAGEAL ECHOCARDIOGRAM N/A 09/01/2012   Procedure: INTRAOPERATIVE TRANSESOPHAGEAL ECHOCARDIOGRAM;  Surgeon: PeIvin PootMD;  [place] [facility] Service: Open Heart Surgery;  Laterality: N/A;  . JOINT REPLACEMENT    . PACEMAKER INSERTION  09/07/2012   Medtronic Sensia single chamber pacemaker implanted by Dr TaLovena Le . PERMANENT PACEMAKER INSERTION N/A 09/07/2012   Procedure: PERMANENT PACEMAKER INSERTION;  Surgeon: GrEvans LanceMD;  [facility];  Service: Cardiovascular;  Laterality: N/A;  . PROSTATE SURGERY    . SPINE SURGERY    . SURGERY ABOUT 50 YRS  AGO FOR PERFORATED ULCER AND REPAIR OF HIATAL HERNIA    . TEE WITHOUT CARDIOVERSION N/A 02/27/2015   Procedure: TRANSESOPHAGEAL ECHOCARDIOGRAM (TEE);  Surgeon: Skeet Latch, MD;  Location: [place];  Service: Cardiovascular;  Laterality: N/A;  . TOTAL KNEE ARTHROPLASTY Left 02/15/2013   Procedure: LEFT TOTAL KNEE ARTHROPLASTY;  Surgeon: Mauri Pole, MD;  Location: [facility];  Service: Orthopedics;  Laterality: Left;  . TRICUSPID VALVE REPLACEMENT N/A 09/01/2012   Procedure: TRICUSPID VALVE REPAIR;  Surgeon: Ivin Poot, MD;  Location: [place];  Service:  Open Heart Surgery;  Laterality: N/A;  . VASCULAR SURGERY      Family History  Problem Relation Age of Onset  . Coronary artery disease Brother   . Healthy Mother   . Emphysema Father   . Healthy Sister   . Alzheimer's disease Brother     Social History:  reports that he quit smoking about 40 years ago. He has never used smokeless tobacco. He reports that he does not drink alcohol or use drugs.  Allergies:  Allergies  Allergen Reactions  . Tape Other (See Comments)    Pulls skin off  . Vancomycin Other (See Comments)    Hearing loss    Medications: I have reviewed the patient's current medications.  Results for orders placed or performed during the hospital encounter of 12/29/15 (from the past 48 hour(s))  Heparin level (unfractionated)     Status: None   Collection Time: 12/31/15  7:20 PM  Result Value Ref Range   Heparin Unfractionated 0.34 0.30 - 0.70 IU/mL    Comment:        IF HEPARIN RESULTS ARE BELOW EXPECTED VALUES, AND PATIENT DOSAGE HAS BEEN CONFIRMED, SUGGEST FOLLOW UP TESTING OF ANTITHROMBIN III LEVELS.   CBC     Status: Abnormal   Collection Time: 01/01/16  5:00 AM  Result Value Ref Range   WBC 5.2 4.0 - 10.5 K/uL   RBC 3.18 (L) 4.22 - 5.81 MIL/uL   Hemoglobin 9.1 (L) 13.0 - 17.0 g/dL   HCT 29.4 (L) 39.0 - 52.0 %   MCV 92.5 78.0 - 100.0 fL   MCH 28.6 26.0 - 34.0 pg   MCHC 31.0 30.0 - 36.0 g/dL   RDW 17.3 (H) 11.5 - 15.5 %   Platelets 92 (L) 150 - 400 K/uL    Comment: CONSISTENT WITH PREVIOUS RESULT  Sedimentation rate     Status: None   Collection Time: 01/01/16  5:00 AM  Result Value Ref Range   Sed Rate 2 0 - 16 mm/hr  Basic metabolic panel     Status: Abnormal   Collection Time: 01/01/16  5:00 AM  Result Value Ref Range   Sodium 137 135 - 145 mmol/L   Potassium 3.7 3.5 - 5.1 mmol/L   Chloride 105 101 - 111 mmol/L   CO2 25 22 - 32 mmol/L   Glucose, Bld 145 (H) 65 - 99 mg/dL   BUN 12 6 - 20 mg/dL   Creatinine, Ser 1.37 (H) 0.61 - 1.24  mg/dL   Calcium 8.9 8.9 - 10.3 mg/dL   GFR calc non Af Amer 43 (L) >60 mL/min   GFR calc Af Amer 50 (L) >60 mL/min    Comment: (NOTE) The eGFR has been calculated using the CKD EPI equation. This calculation has not been validated in all clinical situations. eGFR's persistently <60 mL/min signify possible Chronic Kidney Disease.    Anion gap 7 5 - 15  Heparin level (unfractionated)  Status: None   Collection Time: 01/01/16  5:09 AM  Result Value Ref Range   Heparin Unfractionated 0.32 0.30 - 0.70 IU/mL    Comment:        IF HEPARIN RESULTS ARE BELOW EXPECTED VALUES, AND PATIENT DOSAGE HAS BEEN CONFIRMED, SUGGEST FOLLOW UP TESTING OF ANTITHROMBIN III LEVELS.   Protime-INR     Status: Abnormal   Collection Time: 01/01/16  5:09 AM  Result Value Ref Range   Prothrombin Time 25.9 (H) 11.4 - 15.2 seconds   INR 2.32   Glucose, capillary     Status: Abnormal   Collection Time: 01/01/16 11:15 PM  Result Value Ref Range   Glucose-Capillary 107 (H) 65 - 99 mg/dL   Comment 1 Notify RN    Comment 2 Document in Chart   Culture, blood (routine x 2)     Status: None (Preliminary result)   Collection Time: 01/02/16 12:14 AM  Result Value Ref Range   Specimen Description BLOOD LEFT HAND    Special Requests BOTTLES DRAWN AEROBIC AND ANAEROBIC 5CC    Culture NO GROWTH < 12 HOURS    Report Status PENDING   Culture, blood (routine x 2)     Status: None (Preliminary result)   Collection Time: 01/02/16 12:21 AM  Result Value Ref Range   Specimen Description BLOOD RIGHT ANTECUBITAL    Special Requests BOTTLES DRAWN AEROBIC AND ANAEROBIC 5CC    Culture NO GROWTH < 12 HOURS    Report Status PENDING   CBC     Status: Abnormal   Collection Time: 01/02/16  3:57 AM  Result Value Ref Range   WBC 4.7 4.0 - 10.5 K/uL   RBC 2.97 (L) 4.22 - 5.81 MIL/uL   Hemoglobin 8.6 (L) 13.0 - 17.0 g/dL   HCT 27.6 (L) 39.0 - 52.0 %   MCV 92.9 78.0 - 100.0 fL   MCH 29.0 26.0 - 34.0 pg   MCHC 31.2 30.0 - 36.0  g/dL   RDW 17.6 (H) 11.5 - 15.5 %   Platelets 77 (L) 150 - 400 K/uL    Comment: REPEATED TO VERIFY CONSISTENT WITH PREVIOUS RESULT   Heparin level (unfractionated)     Status: None   Collection Time: 01/02/16  3:57 AM  Result Value Ref Range   Heparin Unfractionated 0.39 0.30 - 0.70 IU/mL    Comment:        IF HEPARIN RESULTS ARE BELOW EXPECTED VALUES, AND PATIENT DOSAGE HAS BEEN CONFIRMED, SUGGEST FOLLOW UP TESTING OF ANTITHROMBIN III LEVELS.   Basic metabolic panel     Status: Abnormal   Collection Time: 01/02/16  3:57 AM  Result Value Ref Range   Sodium 135 135 - 145 mmol/L   Potassium 3.5 3.5 - 5.1 mmol/L   Chloride 102 101 - 111 mmol/L   CO2 25 22 - 32 mmol/L   Glucose, Bld 97 65 - 99 mg/dL   BUN 12 6 - 20 mg/dL   Creatinine, Ser 1.47 (H) 0.61 - 1.24 mg/dL   Calcium 8.7 (L) 8.9 - 10.3 mg/dL   GFR calc non Af Amer 40 (L) >60 mL/min   GFR calc Af Amer 46 (L) >60 mL/min    Comment: (NOTE) The eGFR has been calculated using the CKD EPI equation. This calculation has not been validated in all clinical situations. eGFR's persistently <60 mL/min signify possible Chronic Kidney Disease.    Anion gap 8 5 - 15  Protime-INR     Status: Abnormal   Collection  Time: 01/02/16  3:57 AM  Result Value Ref Range   Prothrombin Time 34.4 (H) 11.4 - 15.2 seconds   INR 3.31   Body fluid culture     Status: None (Preliminary result)   Collection Time: 01/02/16 12:27 PM  Result Value Ref Range   Specimen Description FLUID LEFT KNEE    Special Requests NONE    Gram Stain      FEW WBC PRESENT, PREDOMINANTLY PMN NO ORGANISMS SEEN    Culture PENDING    Report Status PENDING   Synovial cell count + diff, w/ crystals     Status: Abnormal   Collection Time: 01/02/16 12:27 PM  Result Value Ref Range   Color, Synovial RED (A) YELLOW   Appearance-Synovial TURBID (A) CLEAR   Crystals, Fluid NO CRYSTALS SEEN    WBC, Synovial UNABLE TO PERFORM COUNT DUE TO CLOT IN SPECIMEN 0 - 200 /cu mm    Neutrophil, Synovial 82 (H) 0 - 25 %   Lymphocytes-Synovial Fld 14 0 - 20 %   Monocyte-Macrophage-Synovial Fluid 4 (L) 50 - 90 %   Eosinophils-Synovial 0 0 - 1 %    Dg Knee Complete 4 Views Left  Result Date: 12/31/2015 CLINICAL DATA:  Left knee pain after falling 10 days ago. Stroke-like symptoms for 2 days. History of total knee arthroplasty in 2014. EXAM: LEFT KNEE - COMPLETE 4+ VIEW COMPARISON:  None. FINDINGS: The bones appear mildly demineralized. Patient is status post left total knee arthroplasty. The hardware is intact and well positioned. No evidence of acute fracture, dislocation or loosening. There is a small knee joint effusion with increased density in Hoffa's fat. There are diffuse vascular calcifications. IMPRESSION: No acute osseous findings or evidence of hardware loosening status post total knee arthroplasty. Knee joint effusion with increased density in Hoffa's fat. Electronically Signed   By: Richardean Sale M.D.   On: 12/31/2015 20:01    Review of Systems  Constitutional: Positive for chills and fever.  HENT: Negative.   Eyes: Negative.   Gastrointestinal: Negative.   Genitourinary: Negative.   Musculoskeletal: Positive for falls and joint pain.  Skin: Negative.   Neurological: Positive for weakness. Negative for dizziness.  Endo/Heme/Allergies: Bruises/bleeds easily (On Anticoagulation).  Psychiatric/Behavioral: Negative.    Blood pressure (!) 153/78, pulse 95, temperature 98.7 F (37.1 C), temperature source Axillary, resp. rate 16, height 6' (1.829 m), weight 67.3 kg (148 lb 5.9 oz), SpO2 97 %. Physical Exam  HENT:  Head: Atraumatic.  Neck: Normal range of motion.  Cardiovascular: Intact distal pulses.   Respiratory: Effort normal.  GI: Soft.  Genitourinary:  Genitourinary Comments: Deferred  Musculoskeletal:  Left knee effusion. Warm to touch. No redness. Tender to palpation. 2+ pedal pulse.  Neurological:  Poor response to commands.  Skin: Skin is  warm and dry.  Psychiatric: His behavior is normal.    Assessment/Plan: Left knee effusion: Hx of left TKA by Dr.Samiksha Pellicano  Xray of left knee negative Left knee aspiration as below. Labs ordered Will update Dr.Kaymen Adrian Continue current care Will monitor Please let let us know if we can be any further assitance  Right knee aspirated at bedside . Daughter and patient gave verbal consent. Time out completed. Left knee identified. Knee prepped and drapped in appropriate Sterile  Fashion. 39m Lidocaine 1% was injected to the left suprapatellar region. approximately 612mfor sanguinous clotted fluid was aspirated. Non-purulent appearing fluid. Pt tolerated well. Aspirate site dressed. Labs were sent (Cell count, crystals, gram stain, and culture).Aspiration revealed  hemarthrosis most likely related to fall and anticoagulation.  Wyatt Portela L 01/02/2016, 2:12 PM    Patient seen today.  His daughter helping to eat his lunch.  Awake alert  Left knee with persistent effusion, bandaid on lateral knee No erythema Mild warmth No pain with passive movement of knee  Aspiration seems at this point to have identified hemarthrosis from fall and coumadin use.  Negative thus far, clotted so unable to appreciate cell count though no significant left shift.  Would continue to observe as knee not likely source of infection concern Will wait for final culture results Otherwise treat any knee complaints conservatively with ice, tylenol.  Call with questions [phone] [facility]

## 2016-01-02 NOTE — Progress Notes (Signed)
[facility]    Date of Admission:  12/29/2015   Total days of antibiotics 5        Day 1 ceftriaxone        Day 2 anidulafungin          ID: Ethan Lawrence is a 82 y.o. male with hx of CAD s/p CABG, AS s/p AVR, hx of streptococcal PVE and NVE, s/p pacemaker, who was admitted with stroke like symptoms as well as recent streptococcal bacteremia from osh. Principal Problem:   Acute ischemic stroke [facility](HCC) Active Problems:   Normocytic anemia   Chronic a-fib on Coumadin   CAD (coronary artery disease) s/p CABG   Essential hypertension   Chronic diastolic heart failure (HCC)   Long term current use of anticoagulant therapy   Pacemaker   CRBSI (catheter-related bloodstream infection), subsequent encounter   History of endocarditis   Acute CVA (cerebrovascular accident) (HCC)   Left knee pain   Thrombocytopenia (HCC)    Subjective: Slept poorly last night per family, delirious with AMS. Has increasing urinary frequency with little urinary output. Afebrile. Still has left knee pain to undergo bedside arthrocentesis  [facility] - initially started on vanco and gent and narrowed to ceftriaxone once thye found strep. Constellatus - TEE negative - EGD showed gastritis in remnant stomach from billroth I anatomy - CSy showed diverticulosis - abd CT showed 6 cm fluid collection in paracolic region, s/p drain, cx NGTD on 8/23. Repeat abd CT on 8/29 showed resolution. Thought to be seroma - picc line placed on 8/24 ( blood cx + on 8/24, but repeat blood cx negative on 8/28) - WBC scan negative - discharged on 4 wk of ceftriaxone til Sept 25th - presumably this line had been in until Oct 16th   Medications:  . [MAR Hold] anidulafungin  100 mg Intravenous Q24H  . [MAR Hold] cefTRIAXone (ROCEPHIN)  IV  2 g Intravenous Q24H  . [MAR Hold] ferrous sulfate  325 mg Oral Q breakfast  . [MAR  Hold] folic acid  1 mg Oral Daily  . [MAR Hold] predniSONE  5 mg Oral Q breakfast  . [MAR Hold] rOPINIRole  2 mg Oral Daily  . [MAR Hold] simvastatin  20 mg Oral QHS  . [MAR Hold] Warfarin - Pharmacist Dosing Inpatient   Does not apply q1800    Objective: Vital signs in last 24 hours: Temp:  [98.2 F (36.8 C)-99.1 F (37.3 C)] 98.4 F (36.9 C) (10/24 1430) Pulse Rate:  [79-95] 90 (10/24 1430) Resp:  [16-18] 16 (10/24 1430) BP: (127-156)/(66-96) 135/75 (10/24 1430) SpO2:  [96 %-99 %] 96 % (10/24 1430) Physical Exam  Constitutional: He is oriented to self. Confused, not following commands. He appears his stated age. Frail. No distress.  HENT:  Mouth/Throat: Oropharynx is clear and moist. No oropharyngeal exudate. dry Cardiovascular: Normal rate, regular rhythm and normal heart sounds. 3/6 SEM Pulmonary/Chest: Effort normal and breath sounds normal. No respiratory distress. He has no wheezes.  Abdominal: Soft. Bowel sounds are normal. He exhibits no distension. There is no tenderness.  Lymphadenopathy:  He has no cervical adenopathy.  Ext: left swollen knee Skin: Skin is warm and dry. No rash noted. No erythema.  Psychiatric: He has a normal mood and affect. His behavior is normal.    Lab Results  Recent Labs  01/01/16 0500 01/02/16 0357  WBC 5.2 4.7  HGB 9.1* 8.6*  HCT 29.4* 27.6*  NA 137 135  K 3.7 3.5  CL 105 102  CO2 25 25  BUN 12 12  CREATININE 1.37* 1.47*    Recent Labs  01/01/16 0500  ESRSEDRATE 2    Microbiology: Blood cx 10/20 ngtd Blood cx 10/24 Studies/Results: Dg Knee Complete 4 Views Left  Result Date: 12/31/2015 CLINICAL DATA:  Left knee pain after falling 10 days ago. Stroke-like symptoms for 2 days. History of total knee arthroplasty in 2014. EXAM: LEFT KNEE - COMPLETE 4+ VIEW COMPARISON:  None. FINDINGS: The bones appear mildly demineralized. Patient is status post left total knee arthroplasty. The hardware is intact and well positioned. No  evidence of acute fracture, dislocation or loosening. There is a small knee joint effusion with increased density in Hoffa's fat. There are diffuse vascular calcifications. IMPRESSION: No acute osseous findings or evidence of hardware loosening status post total knee arthroplasty. Knee joint effusion with increased density in Hoffa's fat. Electronically Signed   By: Carey Bullocks M.D.   On: 12/31/2015 20:01     Assessment/Plan: Recurrent streptococcal bacteremia = continue on ceftriaxone 2gm iv, will increase to Q12 to cover for possible CNS infection associated with septic emboli. Recommend to proceed with TEE to evaluate heart valves  Urinary frequency/urgency = concern he may have uti, would check bladder scan, and ua with culture  - if TEE is negative, recommend to get CTA-A of abdomen and tagged wbc looking for possible infectious involvement of his vascular stent  I spent 45 min reviewing records and counseling family  Drue Second [facility] Cell: [phone] Pager: [phone]  01/02/2016, 2:54 PM

## 2016-01-02 NOTE — Progress Notes (Signed)
Echocardiogram Echocardiogram Transesophageal has been performed.  Ethan Lawrence 01/02/2016, 4:33 PM

## 2016-01-02 NOTE — Progress Notes (Signed)
Progress Note    Ethan Lawrence  URK:[id number] DOB: Aug 21, 1922  DOA: 12/29/2015 PCP: PROVIDER NOT IN SYSTEM    Brief Narrative:   Chief complaint: Follow-up speech difficulty  Ethan Lawrence is an 82 y.o. male with a PMH of atrial fibrillation on Coumadin, heart block with pacemaker placement, hypertension, hyperlipidemia, coronary artery disease, prosthetic heart valve, CHF and AAA, transferred from [facility] for management of strokelike symptoms on 12/29/15. He has a history of bacteremia and heart valve vegetations in 2016 as well as recurrence in March 2017. He had an acute febrile illness earlier this week which was thought to be due to an indwelling PICC line. PICC line was removed. He had a CT scan of his head which showed no acute intracranial abnormality. MRI cannot be obtained because patient has a cardiac pacemaker. INR was 2.0 on admission.  Assessment/Plan:   Principal Problem:   Acute ischemic stroke [facility]) Neurology consulted on admission. Full stroke workup initiated. Continue telemetry. PT/OT/ST consults requested. LDL at goal at 53, continue Zocor. Follow-up hemoglobin A1c. CT scan obtained which showed moderate chronic microvascular changes without evidence of a large acute infarct. Mild global atrophy. 2-D echo showed an EF of 40-45 percent, moderate aortic stenosis, no source of embolism. Right carotid duplex showed mild disease.  TEE Scheduled for 01/02/16 to rule out murantic emboli.  Active Problems:   Left knee pain H/O TKR.  Joint now swollen, warm compared to right knee.  Plain films showed no acute osseous findings or evidence of hardware loosening, but did show a knee joint effusion with increased density in Hoffa's fat.  I have personally reviewed the images and agree with these findings.ESR not elevated. Unable to get IR to perform arthrocentesis secondary to being on blood thinners. Discussed with orthopedic  physician on call regarding need for joint aspiration. If need to be aspirated per orthopedics today. Will need cell count/Gram stain/cultures.    Dyspnea Resolved. Chest x-ray showed a left lower lobe early infiltrate versus atelectasis. Afebrile. No evidence of pneumonia clinically.    Coronary artery disease status post CABG Continue statin. Anticoagulated.    Chronic diastolic CHF, NYHA class II Compensated at this time.    History of complete heart block status post pacemaker Records reviewed, followed by Dr. Lovena Le who reports his medtronic device is programmed VVIR. 9 years on the battery.    Aortic stenosis status post aortic valve replacement Goal INR 2.5 (3.31 today).    Chronic a-fib on Coumadin INR therapeutic, heparin discontinued 01/01/16.    Essential hypertension Not currently on antihypertensive therapies. Blood pressure not elevated.    CRBSI (catheter-related bloodstream infection), subsequent encounter/history of bacterial endocarditis Appreciate ID recommendations. TEE planned for today. He has had recurrent strep bacteremia with unclear source. We'll to pull potential etiologies given that he has a pacemaker, artificial joints including his left knee, and a history of aorto iliac stents. Antibiotics switched from Teflaro to ceftriaxone by ID 01/01/16. Eraxis added for antifungal coverage.    Normocytic anemia Suspect anemia of chronic disease. No current indication for transfusion.    Thrombocytopenia Question related to blood thinners or possibly smoldering infection. Follow-up HIT panel.    Tinea corporis/onychomycosis Topical antifungals ordered.    Acute kidney injury Increase IV fluids. Not on any nephrotoxic medications.  Family Communication/Anticipated D/C date and plan/Code Status   DVT prophylaxis: Coumadin ordered. Code Status: Full Code.  Family Communication: Granddaughter and daughter at bedside. Disposition  Plan: Home when work up  completed, plan of care fully defined.   Medical Consultants:    Neurology  Cardiology  Infectious Disease  Orthopedics   Procedures:   2-D echo 12/30/15:  Study Conclusions  - Left ventricle: The cavity size was normal. Wall thickness was   increased in a pattern of moderate LVH. Systolic function was   mildly to moderately reduced. The estimated ejection fraction was   in the range of 40% to 45%. - Aortic valve: There was moderate stenosis. Valve area (VTI): 1.19   cm^2. Valve area (Vmax): 1.15 cm^2. Valve area (Vmean): 1.11   cm^2. - Mitral valve: Moderately calcified annulus. There was moderate to   severe regurgitation. - Left atrium: The atrium was massively dilated. - Right atrium: The atrium was moderately dilated.  Right carotid duplex 12/30/15: Preliminary report:  1-39% right ICA plaquing.  Bilateral vertebral artery flow is antegrade.  Unable to scan left carotid secondary to patient's inability to keep head turned.  Bedside left knee aspiration 01/02/16  Anti-Infectives:    Levaquin 12/30/15--->12/31/15  Teflaro 12/31/15--->01/01/16  Rocephin 01/01/16--->  Eraxis 01/02/16--->  Subjective:   The patient is a little more sleepy today.  His speech remains fluent and he is following commands.  Denies dyspnea/cough, but his granddaughter says he had an episode of dyspnea overnight.  No fever/chills. No chest pain. Continues to complain of left knee pain.  Objective:    Vitals:   01/01/16 1829 01/01/16 2048 01/02/16 0119 01/02/16 0645  BP: (!) 153/90 127/66 (!) 156/70 (!) 149/96  Pulse: 79 92 87 79  Resp: 18 16 16 16   Temp: 98.2 F (36.8 C) 99.1 F (37.3 C) 98.7 F (37.1 C)   TempSrc: Axillary Oral Oral   SpO2: 99% 98% 99% 99%  Weight:      Height:        Intake/Output Summary (Last 24 hours) at 01/02/16 0815 Last data filed at 01/01/16 2300  Gross per 24 hour  Intake           480.42 ml  Output                0 ml  Net            480.42 ml   Filed Weights   12/30/15 0005  Weight: 67.3 kg (148 lb 5.9 oz)    Exam: General exam: No distress, Sleeping after being given a morphine shot for pain Respiratory system: Clear to auscultation. Respiratory effort normal. Cardiovascular system: HSIR. No JVD,  rubs, gallops or clicks. III/VI murmur. Gastrointestinal system: Abdomen is nondistended, soft and nontender. No organomegaly or masses felt. Normal bowel sounds heard. Central nervous system: Sleepy, family reports restlessness/agitation/confusion overnight.  Extremities: No clubbing,  or cyanosis. 1+ edema.  Skin: No rashes, lesions or ulcers. Psychiatry: Unable to assess. Patient sleeping.  Data Reviewed:   I have personally reviewed following labs and imaging studies:  Labs: Basic Metabolic Panel:  Recent Labs Lab 12/31/15 0246 01/01/16 0500 01/02/16 0357  NA 136 137 135  K 3.5 3.7 3.5  CL 104 105 102  CO2 22 25 25   GLUCOSE 99 145* 97  BUN 14 12 12   CREATININE 1.29* 1.37* 1.47*  CALCIUM 8.5* 8.9 8.7*   GFR Estimated Creatinine Clearance: 31.2 mL/min (by C-G formula based on SCr of 1.47 mg/dL (H)). Liver Function Tests: No results for input(s): AST, ALT, ALKPHOS, BILITOT, PROT, ALBUMIN in the last 168 hours. No results for input(s): LIPASE, AMYLASE in  the last 168 hours. No results for input(s): AMMONIA in the last 168 hours. Coagulation profile  Recent Labs Lab 12/30/15 0508 12/31/15 0246 01/01/16 0509 01/02/16 0357  INR 1.95 1.75 2.32 3.31    CBC:  Recent Labs Lab 12/30/15 0508 12/31/15 0246 01/01/16 0500 01/02/16 0357  WBC 5.5 4.8 5.2 4.7  HGB 8.3* 8.8* 9.1* 8.6*  HCT 26.7* 28.3* 29.4* 27.6*  MCV 92.4 92.8 92.5 92.9  PLT 98* 94* 92* 77*   CBG:  Recent Labs Lab 12/30/15 0042 12/30/15 1224 01/01/16 2315  GLUCAP 103* 112* 107*    Microbiology Recent Results (from the past 240 hour(s))  Culture, blood (routine x 2)     Status: None (Preliminary result)   Collection  Time: 12/29/15 11:20 PM  Result Value Ref Range Status   Specimen Description BLOOD RIGHT ARM  Final   Special Requests BOTTLES DRAWN AEROBIC AND ANAEROBIC 5ML  Final   Culture NO GROWTH 2 DAYS  Final   Report Status PENDING  Incomplete  Culture, blood (routine x 2)     Status: None (Preliminary result)   Collection Time: 12/29/15 11:26 PM  Result Value Ref Range Status   Specimen Description BLOOD RIGHT ARM  Final   Special Requests BOTTLES DRAWN AEROBIC AND ANAEROBIC 5ML  Final   Culture NO GROWTH 2 DAYS  Final   Report Status PENDING  Incomplete    Radiology: Dg Knee Complete 4 Views Left  Result Date: 12/31/2015 CLINICAL DATA:  Left knee pain after falling 10 days ago. Stroke-like symptoms for 2 days. History of total knee arthroplasty in 2014. EXAM: LEFT KNEE - COMPLETE 4+ VIEW COMPARISON:  None. FINDINGS: The bones appear mildly demineralized. Patient is status post left total knee arthroplasty. The hardware is intact and well positioned. No evidence of acute fracture, dislocation or loosening. There is a small knee joint effusion with increased density in Hoffa's fat. There are diffuse vascular calcifications. IMPRESSION: No acute osseous findings or evidence of hardware loosening status post total knee arthroplasty. Knee joint effusion with increased density in Hoffa's fat. Electronically Signed   By: Richardean Sale M.D.   On: 12/31/2015 20:01    Medications:   . anidulafungin  100 mg Intravenous Q24H  . cefTRIAXone (ROCEPHIN)  IV  2 g Intravenous Q24H  . ferrous sulfate  325 mg Oral Q breakfast  . folic acid  1 mg Oral Daily  . predniSONE  5 mg Oral Q breakfast  . rOPINIRole  2 mg Oral Daily  . simvastatin  20 mg Oral QHS  . Warfarin - Pharmacist Dosing Inpatient   Does not apply q1800   Continuous Infusions: . sodium chloride    . heparin 1,250 Units/hr (01/01/16 2694)    Medical decision making is of high complexity and this patient is at high risk of deterioration,  therefore this is a level 3 visit.     LOS: 4 days   [facility] Pager [phone]. If unable to reach me by pager, please call my cell phone at [phone].  *Please refer to [email or web address], password [email or web address] to get updated schedule on who will round on this patient, as hospitalists switch teams weekly. If 7PM-7AM, please contact night-coverage at [email or web address], password [email or web address] for any overnight needs.  01/02/2016, 8:15 AM

## 2016-01-03 ENCOUNTER — Encounter (HOSPITAL_COMMUNITY): Payer: Self-pay | Admitting: Cardiology

## 2016-01-03 ENCOUNTER — Inpatient Hospital Stay (HOSPITAL_COMMUNITY): Payer: Medicare PPO

## 2016-01-03 DIAGNOSIS — I5032 Chronic diastolic (congestive) heart failure: Secondary | ICD-10-CM

## 2016-01-03 DIAGNOSIS — N5089 Other specified disorders of the male genital organs: Secondary | ICD-10-CM

## 2016-01-03 LAB — BASIC METABOLIC PANEL
ANION GAP: 8 (ref 5–15)
BUN: 14 mg/dL (ref 6–20)
CALCIUM: 9 mg/dL (ref 8.9–10.3)
CO2: 26 mmol/L (ref 22–32)
Chloride: 103 mmol/L (ref 101–111)
Creatinine, Ser: 1.49 mg/dL — ABNORMAL HIGH (ref 0.61–1.24)
GFR calc non Af Amer: 39 mL/min — ABNORMAL LOW (ref >60)
GFR, EST AFRICAN AMERICAN: 45 mL/min — AB (ref >60)
GLUCOSE: 105 mg/dL — AB (ref 65–99)
Potassium: 4.1 mmol/L (ref 3.5–5.1)
Sodium: 137 mmol/L (ref 135–145)

## 2016-01-03 LAB — CBC
HEMATOCRIT: 27.5 % — AB (ref 39.0–52.0)
HEMOGLOBIN: 8.6 g/dL — AB (ref 13.0–17.0)
MCH: 29.1 pg (ref 26.0–34.0)
MCHC: 31.3 g/dL (ref 30.0–36.0)
MCV: 92.9 fL (ref 78.0–100.0)
Platelets: 72 10*3/uL — ABNORMAL LOW (ref 150–400)
RBC: 2.96 MIL/uL — AB (ref 4.22–5.81)
RDW: 18 % — ABNORMAL HIGH (ref 11.5–15.5)
WBC: 5.2 10*3/uL (ref 4.0–10.5)

## 2016-01-03 LAB — PROTIME-INR
INR: 3.69
Prothrombin Time: 37.5 seconds — ABNORMAL HIGH (ref 11.4–15.2)

## 2016-01-03 MED ORDER — ZOLPIDEM TARTRATE 5 MG PO TABS
5.0000 mg | ORAL_TABLET | Freq: Once | ORAL | Status: AC
  Administered 2016-01-03: 5 mg via ORAL
  Filled 2016-01-03: qty 1

## 2016-01-03 MED ORDER — IOPAMIDOL (ISOVUE-370) INJECTION 76%
INTRAVENOUS | Status: AC
  Filled 2016-01-03: qty 100

## 2016-01-03 NOTE — Progress Notes (Signed)
Physical Therapy Treatment Patient Details Name: Ethan Lawrence MRN: [id number] DOB: Jul 11, 1922 Today's Date: 01/03/2016    History of Present Illness 82 y.o.malewith medical history significant of Prosthetic aortic valve replacement, pacemaker for complete heart block. Pt presenting with acute onset of L sided weakness, aphasia. CT on 10/21 neg for acute infarct.    PT Comments    Despite fatigue, lethargy, and L knee pain pt able to tolerate standing >3 min and began ambulation this date. Pt remains to require increased assist for all mobility and still remains appropriate for SNF upon d/c.  Follow Up Recommendations  SNF;Supervision/Assistance - 24 hour     Equipment Recommendations  Rolling walker with 5" wheels    Recommendations for Other Services       Precautions / Restrictions Precautions Precautions: Fall Restrictions Weight Bearing Restrictions: No    Mobility  Bed Mobility Overal bed mobility: Needs Assistance Bed Mobility: Sit to Supine       Sit to supine: Max assist   General bed mobility comments: modA for LE management back into bed  Transfers Overall transfer level: Needs assistance Equipment used: Rolling walker (2 wheeled);2 person hand held assist Transfers: Sit to/from Stand Sit to Stand: Min assist;+2 physical assistance         General transfer comment: v/c's to push up from bed, increased time, increased abiltiy to WB through L knee  Ambulation/Gait Ambulation/Gait assistance: Mod assist;+2 safety/equipment Ambulation Distance (Feet): 7 Feet Assistive device: Rolling walker (2 wheeled)   Gait velocity: slow Gait velocity interpretation: Below normal speed for age/gender General Gait Details: pt able to advance LEs, increased assist under L armpit to off weight L LE due to pain.    Stairs            Wheelchair Mobility    Modified Rankin (Stroke Patients Only) Modified Rankin (Stroke Patients Only) Pre-Morbid  Rankin Score: Moderate disability Modified Rankin: Severe disability     Balance           Standing balance support: During functional activity Standing balance-Leahy Scale: Poor Standing balance comment: pt provided minA to maintain balance while OT worked with pt doing ADL at sink, pt did stand briefly at EOB without UE support to use urinal for a couple of seconds                    Cognition Arousal/Alertness: Awake/alert Behavior During Therapy: WFL for tasks assessed/performed Overall Cognitive Status: Within Functional Limits for tasks assessed                      Exercises General Exercises - Lower Extremity Long Arc Quad: AROM;10 reps;Seated;Strengthening;Both    General Comments        Pertinent Vitals/Pain Pain Assessment: 0-10 Pain Score: 5  Pain Location: L knee Pain Descriptors / Indicators: Aching Pain Intervention(s): Monitored during session    Home Living                      Prior Function            PT Goals (current goals can now be found in the care plan section) Progress towards PT goals: Progressing toward goals    Frequency    Min 3X/week      PT Plan Current plan remains appropriate    Co-evaluation PT/OT/SLP Co-Evaluation/Treatment: Yes Reason for Co-Treatment: Complexity of the patient's impairments (multi-system involvement) PT goals addressed during session: Mobility/safety with mobility  End of Session Equipment Utilized During Treatment: Gait belt Activity Tolerance: Patient limited by lethargy;Patient limited by fatigue Patient left: in bed;with call bell/phone within reach;with family/visitor present     Time: 1420-1443 PT Time Calculation (min) (ACUTE ONLY): 23 min  Charges:  $Gait Training: 8-22 mins                    G Codes:      Marcene BrawnChadwell, Ethan Lawrence 01/03/2016, 3:03 PM   Lewis ShockAshly Samik Lawrence, PT, DPT Pager #: [phone] Office #: [phone]

## 2016-01-03 NOTE — Progress Notes (Signed)
   No vegetation or evidence of abscess noted on TEE.  For the time being, we will sign off.

## 2016-01-03 NOTE — Progress Notes (Signed)
Speech Language Pathology Treatment: Cognitive-Linquistic  Patient Details Name: Ethan Lawrence MRN: [id number] DOB: 06-11-1922 Today's Date: 01/03/2016 Time: [id number] SLP Time Calculation (min) (ACUTE ONLY): 11 min  Assessment / Plan / Recommendation Clinical Impression  Pt Min A verbal cues for sustained attention in a moderately distracting environment for ~10 minutes. Pt required Mod A verbal cues for orientation to time and situation. Daughter present and informs that pt's cognition is improving as he is able to "focus" better. Pt able to immediate conversation with MD. Pt left in bed with daughter present. Continue current plan of care.     HPI HPI: Ethan HoraCharles E Blankenshipis a 82 y.o.malewith medical history significant of Prosthetic aortic valve replacement, pacemaker for complete heart block. In December of 2016 he had S.Viridans bacteremia, TEE revealed a small mobile structure (vegetation vs suture) on the ring of the bioprosthetic aortic valve. Patient treated with 6 weeks of rocephin. He apparently had another bout of recurrent bacteremia in ~March or so (6 weeks ABX), and yet a 3rd for which he just finished up his ABx for about a month ago. Despite finishing ABx his PICC line was left in. And earlier this week he developed fever 103, and wasn't feeling well. [facility] Per family the "Picc line was source of infection and was removed". Patient was discharged on Augmentin and macrobid. Today he developed L sided weakness, aphasia. BGL initially low and was corrected but Aphasia persists. [facility]  Most recent head CT is negative for acute findings showing moderate chronic microvascular changes.        SLP Plan  Continue with current plan of care     Recommendations                   Follow up Recommendations:  (TBD) Plan:  Continue with current plan of care       GO             Ethan Lawrence, M.S., CCC-SLP Speech-Language Pathologist     Ethan Lawrence 01/03/2016, 3:58 PM

## 2016-01-03 NOTE — Progress Notes (Signed)
Occupational Therapy Treatment Patient Details Name: Ethan Lawrence MRN: [id number] DOB: Nov 02, 1922 Today's Date: 01/03/2016    History of present illness 82 y.o.malewith medical history significant of Prosthetic aortic valve replacement, pacemaker for complete heart block. Pt presenting with acute onset of L sided weakness, aphasia. CT on 10/21 neg for acute infarct.   OT comments  Pt making good progress toward OT goals this session. Pt tolerated standing at the sink to complete grooming activity and attempted urinating in standing. Able to perform functional mobility in room with mod assist +2 today. Pt fatigues quickly but very agreeable to participate in therapy this session. D/c plan remains appropriate. Will continue to follow acutely.   Follow Up Recommendations  SNF;Supervision/Assistance - 24 hour    Equipment Recommendations  Other (comment) (TBD at next venue)    Recommendations for Other Services      Precautions / Restrictions Precautions Precautions: Fall Restrictions Weight Bearing Restrictions: No       Mobility Bed Mobility Overal bed mobility: Needs Assistance Bed Mobility: Sit to Supine       Sit to supine: Max assist   General bed mobility comments: modA for LE management back into bed  Transfers Overall transfer level: Needs assistance Equipment used: Rolling walker (2 wheeled);2 person hand held assist Transfers: Sit to/from Stand Sit to Stand: Min assist;+2 physical assistance         General transfer comment: v/c's to push up from bed, increased time, increased abiltiy to WB through L knee    Balance Overall balance assessment: Needs assistance Sitting-balance support: Feet supported;No upper extremity supported Sitting balance-Leahy Scale: Good     Standing balance support: During functional activity Standing balance-Leahy Scale: Poor Standing balance comment: pt provided minA to maintain balance while worked with pt doing  ADL at sink, pt did stand briefly at EOB without UE support to use urinal for a couple of seconds                   ADL Overall ADL's : Needs assistance/impaired     Grooming: Minimal assistance;Standing;Wash/dry face;Sitting;Brushing hair Grooming Details (indicate cue type and reason): Standing for washing face, sitting for combing hair. +2 assist for safety.                 Toilet Transfer: Moderate assistance;+2 for physical assistance;Ambulation;BSC;RW Toilet Transfer Details (indicate cue type and reason): Simulated by sit to stand from chair with short distance functional mobility in room. Pt is quick to fatigue. Pt able to stand at EOB and attempt to use urinal with min assist +2.         Functional mobility during ADLs: Moderate assistance;Rolling walker;+2 for physical assistance General ADL Comments: Pt agreeable to participate in activities this session but quick to fatigue.      Vision                     Perception     Praxis      Cognition   Behavior During Therapy: [facility] for tasks assessed/performed Overall Cognitive Status: Within Functional Limits for tasks assessed                       Extremity/Trunk Assessment               Exercises   Shoulder Instructions       General Comments      Pertinent Vitals/ Pain       Pain  Assessment: No/denies pain Pain Score: 0-No pain Faces Pain Scale: No hurt Pain Location: L knee Pain Descriptors / Indicators: Aching Pain Intervention(s): Monitored during session  Home Living                                          Prior Functioning/Environment              Frequency  Min 2X/week        Progress Toward Goals  OT Goals(current goals can now be found in the care plan section)  Progress towards OT goals: Progressing toward goals  Acute Rehab OT Goals Patient Stated Goal: rest/take a nap OT Goal Formulation: With patient  Plan Discharge  plan remains appropriate    Co-evaluation    PT/OT/SLP Co-Evaluation/Treatment: Yes Reason for Co-Treatment: Complexity of the patient's impairments (multi-system involvement) PT goals addressed during session: Mobility/safety with mobility OT goals addressed during session: ADL's and self-care      End of Session Equipment Utilized During Treatment: Gait belt;Rolling walker   Activity Tolerance Patient tolerated treatment well   Patient Left in bed;with call bell/phone within reach;with bed alarm set;with family/visitor present   Nurse Communication Mobility status        Time: 1420-1445 OT Time Calculation (min): 25 min  Charges: OT General Charges $OT Visit: 1 Procedure OT Treatments $Self Care/Home Management : 8-22 mins  Gaye AlkenBailey A Shaindy Reader M.S., OTR/L Pager: [phone]  01/03/2016, 4:22 PM

## 2016-01-03 NOTE — Progress Notes (Signed)
PROGRESS NOTE    SRIANSH FARRA  GDJ:[id number] DOB: July 04, 1922 DOA: 12/29/2015 PCP: PROVIDER NOT IN SYSTEM   Brief Narrative:  SARVESH MEDDAUGH is an 82 y.o. male with a PMH of atrial fibrillation on Coumadin, heart block with pacemaker placement, hypertension, hyperlipidemia, coronary artery disease, prosthetic heart valve, CHF and AAA, transferred from [facility] for management of strokelike symptoms on 12/29/15. He has a history of bacteremia and heart valve vegetations in 2016 as well as recurrence in March 2017. He had an acute febrile illness earlier this week which was thought to be due to an indwelling PICC line. PICC line was removed. He had a CT scan of his head which showed no acute intracranial abnormality. MRI cannot be obtained because patient has a cardiac pacemaker. INR was 2.0 on admission. Patient currently being worked up for recurrent Strep Bacteremia which was found at [facility] last week.   Assessment & Plan:   Principal Problem:   Acute ischemic stroke (HCC) Active Problems:   Normocytic anemia   Chronic a-fib on Coumadin   CAD (coronary artery disease) s/p CABG   Essential hypertension   Chronic diastolic heart failure ([place])   Long term current use of anticoagulant therapy   Pacemaker   CRBSI (catheter-related bloodstream infection), subsequent encounter   History of endocarditis   Acute CVA (cerebrovascular accident) ([place])   Left knee pain   Thrombocytopenia ([place])  Likely Acute ischemic stroke [facility]) -Neurology consulted on admission.  -Full stroke workup initiated.  -Continue telemetry.  -LDL at goal at 53, continue Zocor.  -Follow-up hemoglobin A1c.  -CT scan obtained which showed moderate chronic microvascular changes without evidence of a large acute infarct. Mild global atrophy.  -2-D echo showed an EF of 40-45 percent, moderate aortic stenosis, no source of embolism.  -No MRI because of  Pacer -Right Carotid Duplex prelim report showed mild disease with 1-39% right ICA plaquing. Bilateral vertebral artery flow is antegrade. Unable to scan left carotid secondary to patient's inability to keep head turned. -TEE showed no evidence of endocarditis. There was no evidence of a vegetation. -PT/OT consulted and recommend SNF -Continue to follow SLP  Recurrent Strep Bacteremia CRBSI (catheter-related bloodstream infection), subsequent encounter/history of bacterial endocarditis -Was found to be Bacteremic on 10/15 and 10/17 with Strep Species at [place] was removed then which was previously retained since August. Cath Tip Had Yeast on 10/16.  -Appreciate ID recommendations.  -He has had recurrent strep bacteremia with unclear source. (had Strep Viridans in Decemeber 2016) -TEE Negative wit no evidence of endocarditis or evidence of vegetation. -Will consider all etiologies given that he has a pacemaker, artificial joints including his left knee, and a history of aorto iliac stents.  -Will obtain CTA Abdomen with Contrast to evaluate Aorto-iliac stents and obtain Tagged WBC Scan to try to identify source of infection -Antibiotics switched from Teflaro to ceftriaxone by ID 01/01/16. Eraxis added for antifungal coverage.  Scrotal Swelling/Inguinal Hernia -Will obtain Pelvis Limited U/S and Scrotal Ultrasound -Will obtain U/A and Urine Culture   Left knee pain -H/O TKR.   -Joint now swollen, warm compared to right knee.   -Plain films showed no acute osseous findings or evidence of hardware loosening, but did show a knee joint effusion with increased density in Hoffa's fat.   -ESR not elevated. Unable to get IR to perform arthrocentesis secondary to being on blood thinners.  -Dr. Rockne Menghini discussed with Orthopedic physician on call  regarding need for joint aspiration.  -Dr. Alvan Dame Aspirated joint yesterday and aspirate seemed to have hemarthrosis from fall and  coumadin use.  -Clotted aspirate so unable to appreciate Cell Count -Aspirate showed Red color, with Turbid Appearance, No Crystals were seen. WBC unable to be performed. Neutrophils were 92%, Lymphocytes 14%, Monocytes were 4% -Culture showed no growth in 24 hours.  -Continue to Monitor  Dyspnea -Resolved.  -Chest x-ray showed a left lower lobe early infiltrate versus atelectasis.  -Afebrile. No evidence of pneumonia clinically.  Coronary artery disease status post CABG Continue Statin. Anticoagulated with Coumadin but held for now as Supratherapeutic INR.  Chronic diastolic CHF, NYHA class II Compensated at this time and currently not in exacerbation.  History of Complete Heart Block s/p Pacemaker -Continue Telemetry -Patient is followed by Dr. Lovena Le who reports his medtronic device is programmed VVIR. 9 years on the battery.  Aortic Stenosis s/p Aortic Valve replacement Goal INR 2.5 (3.69 today).  Chronic A-fib on Coumadin -INR supratherapeutic, heparin discontinued 01/01/16. -On Telemetry -Coumadin being held tonight -Repeat INR in AM  Essential Hypertension Not currently on antihypertensive therapies. Blood pressure not elevated.  Normocytic Anemia -Suspect anemia of chronic disease.  -No current indication for transfusion. -Hb/Hct was stable at 8.6/27.5 -Continue to monitor CBC  Thrombocytopenia -Platelets went from 77 -> 72 -Question related to blood thinners or possibly smoldering infection with Strep Bactermia.  -Follow-up HIT panel. -Repeat CBC  Tinea corporis/onychomycosis Topical antifungals ordered.  CKD Stage 3 -Review of previous GFR and Creatine shows basline is around 1.5/1.6 at baseline -Stable.  -Continue to Monitor BMP  DVT prophylaxis: Anticoagulated with Warfarin Code Status: Full Family Communication: Discussed Plan of Care with [name] Daughter and Granddaughter who were present at bedside.  Disposition Plan: SNF when  ready  Consultants:   Neurology  Cardiology  Infectious Diseases  Orthopedics   Procedures: CTA of Abdomen, Tagged WBC Scan; Ultrasound of Scrotum and Pelvis  Antimicrobials:   Levaquin 12/30/15--->12/31/15  Teflaro 12/31/15--->01/01/16  Rocephin 01/01/16--->  Eraxis 01/02/16--->  Subjective: Patient was seen and examined at bedside this AM and he was feeling better. Per his granddaughter he was complaining of scrotal swelling and pain in left side of groin which has been going on for a few months now. Had no active complaints or concerns and denied Cp, SOB, N/V or Lightheadedness.   Objective: Vitals:   01/02/16 2201 01/03/16 0150 01/03/16 0500 01/03/16 1335  BP: 114/60 (!) 92/50 137/79 (!) 130/57  Pulse: 89 73 67 93  Resp: 18 18 16 18   Temp: 98.1 F (36.7 C) 99.5 F (37.5 C) 99.1 F (37.3 C) 99.1 F (37.3 C)  TempSrc: Axillary Oral Oral Oral  SpO2: 93% 94% 97% 97%  Weight:      Height:       No intake or output data in the 24 hours ending 01/03/16 1537 Filed Weights   12/30/15 0005  Weight: 67.3 kg (148 lb 5.9 oz)    Examination: Physical Exam:  Constitutional: Thin elderly gentleman in NAD Eyes:  Lids and conjunctivae normal, sclerae anicteric  ENMT: External Ears, Nose appear normal. Grossly normal hearing.  Neck: Appears normal, supple, no cervical masses, normal ROM, no appreciable thyromegaly Respiratory: Clear to auscultation bilaterally, no wheezing, rales, rhonchi or crackles. Normal respiratory effort and patient is not tachypenic. No accessory muscle use.  Cardiovascular: Irregularly Irregular, 3/6 Systolic Murmur appreciated. No rubs / gallops. S1 and S2 auscultated. No extremity edema. 2+ pedal pulses. No carotid bruits.  Abdomen: Soft, Mildly tender lower abdomen, non-distended. No masses palpated. No appreciable hepatosplenomegaly. Bowel sounds positive.  GU: Mild scrotal swelling with possible Left hernia. Musculoskeletal: No clubbing /  cyanosis of digits/nails. No joint deformity upper and lower extremities. Warm and mildly swollen Left knee.  Skin: No rashes, lesions, ulcers. No induration; Warm and dry.  Neurologic: CN 2-12 grossly intact with no focal deficits. Romberg sign cerebellar reflexes not assessed.  Psychiatric: Normal judgment and insight. Alert and oriented x 3. Normal mood and appropriate affect.   Data Reviewed: I have personally reviewed following labs and imaging studies  CBC:  Recent Labs Lab 12/30/15 0508 12/31/15 0246 01/01/16 0500 01/02/16 0357 01/03/16 0440  WBC 5.5 4.8 5.2 4.7 5.2  HGB 8.3* 8.8* 9.1* 8.6* 8.6*  HCT 26.7* 28.3* 29.4* 27.6* 27.5*  MCV 92.4 92.8 92.5 92.9 92.9  PLT 98* 94* 92* 77* 72*   Basic Metabolic Panel:  Recent Labs Lab 12/31/15 0246 01/01/16 0500 01/02/16 0357 01/03/16 0440  NA 136 137 135 137  K 3.5 3.7 3.5 4.1  CL 104 105 102 103  CO2 22 25 25 26   GLUCOSE 99 145* 97 105*  BUN 14 12 12 14   CREATININE 1.29* 1.37* 1.47* 1.49*  CALCIUM 8.5* 8.9 8.7* 9.0   GFR: Estimated Creatinine Clearance: 30.7 mL/min (by C-G formula based on SCr of 1.49 mg/dL (H)). Liver Function Tests: No results for input(s): AST, ALT, ALKPHOS, BILITOT, PROT, ALBUMIN in the last 168 hours. No results for input(s): LIPASE, AMYLASE in the last 168 hours. No results for input(s): AMMONIA in the last 168 hours. Coagulation Profile:  Recent Labs Lab 12/30/15 0508 12/31/15 0246 01/01/16 0509 01/02/16 0357 01/03/16 0440  INR 1.95 1.75 2.32 3.31 3.69   Cardiac Enzymes: No results for input(s): CKTOTAL, CKMB, CKMBINDEX, TROPONINI in the last 168 hours. BNP (last 3 results) No results for input(s): PROBNP in the last 8760 hours. HbA1C: No results for input(s): HGBA1C in the last 72 hours. CBG:  Recent Labs Lab 12/30/15 0042 12/30/15 1224 01/01/16 2315  GLUCAP 103* 112* 107*   Lipid Profile: No results for input(s): CHOL, HDL, LDLCALC, TRIG, CHOLHDL, LDLDIRECT in the last  72 hours. Thyroid Function Tests: No results for input(s): TSH, T4TOTAL, FREET4, T3FREE, THYROIDAB in the last 72 hours. Anemia Panel: No results for input(s): VITAMINB12, FOLATE, FERRITIN, TIBC, IRON, RETICCTPCT in the last 72 hours. Sepsis Labs: No results for input(s): PROCALCITON, LATICACIDVEN in the last 168 hours.  Recent Results (from the past 240 hour(s))  Culture, blood (routine x 2)     Status: None (Preliminary result)   Collection Time: 12/29/15 11:20 PM  Result Value Ref Range Status   Specimen Description BLOOD RIGHT ARM  Final   Special Requests BOTTLES DRAWN AEROBIC AND ANAEROBIC 5ML  Final   Culture NO GROWTH 4 DAYS  Final   Report Status PENDING  Incomplete  Culture, blood (routine x 2)     Status: None (Preliminary result)   Collection Time: 12/29/15 11:26 PM  Result Value Ref Range Status   Specimen Description BLOOD RIGHT ARM  Final   Special Requests BOTTLES DRAWN AEROBIC AND ANAEROBIC 5ML  Final   Culture NO GROWTH 4 DAYS  Final   Report Status PENDING  Incomplete  Culture, blood (routine x 2)     Status: None (Preliminary result)   Collection Time: 01/02/16 12:14 AM  Result Value Ref Range Status   Specimen Description BLOOD LEFT HAND  Final   Special Requests BOTTLES  DRAWN AEROBIC AND ANAEROBIC 5CC  Final   Culture NO GROWTH 1 DAY  Final   Report Status PENDING  Incomplete  Culture, blood (routine x 2)     Status: None (Preliminary result)   Collection Time: 01/02/16 12:21 AM  Result Value Ref Range Status   Specimen Description BLOOD RIGHT ANTECUBITAL  Final   Special Requests BOTTLES DRAWN AEROBIC AND ANAEROBIC 5CC  Final   Culture NO GROWTH 1 DAY  Final   Report Status PENDING  Incomplete  Body fluid culture     Status: None (Preliminary result)   Collection Time: 01/02/16 12:27 PM  Result Value Ref Range Status   Specimen Description FLUID LEFT KNEE  Final   Special Requests NONE  Final   Gram Stain   Final    FEW WBC PRESENT, PREDOMINANTLY  PMN NO ORGANISMS SEEN    Culture NO GROWTH < 24 HOURS  Final   Report Status PENDING  Incomplete    Radiology Studies: No results found.  Scheduled Meds: . anidulafungin  100 mg Intravenous Q24H  . cefTRIAXone (ROCEPHIN)  IV  2 g Intravenous Q24H  . ferrous sulfate  325 mg Oral Q breakfast  . folic acid  1 mg Oral Daily  . predniSONE  5 mg Oral Q breakfast  . rOPINIRole  2 mg Oral Daily  . simvastatin  20 mg Oral QHS  . Warfarin - Pharmacist Dosing Inpatient   Does not apply q1800   Continuous Infusions:    LOS: 5 days   Kerney Elbe, DO [facility] Pager [phone]  If 7PM-7AM, please contact night-coverage [email or web address] Password [facility] 01/03/2016, 3:37 PM

## 2016-01-03 NOTE — Progress Notes (Signed)
ANTICOAGULATION CONSULT NOTE - Follow Up Consult  Pharmacy Consult for Warfarin Indication: afib with bioprosthetic AVR; likely new embolic event   Allergies  Allergen Reactions  . Tape Other (See Comments)    Pulls skin off  . Vancomycin Other (See Comments)    Hearing loss    Patient Measurements: Height: 6' (182.9 cm) Weight: 148 lb 5.9 oz (67.3 kg) IBW/kg (Calculated) : 77.6  Heparin Dosing Weight: 70 kg  Vital Signs: Temp: 99.1 F (37.3 C) (10/25 0500) Temp Source: Oral (10/25 0500) BP: 137/79 (10/25 0500) Pulse Rate: 67 (10/25 0500)  Labs:  Recent Labs  12/31/15 1920  01/01/16 0500 01/01/16 0509 01/02/16 0357 01/03/16 0440  HGB  --   < > 9.1*  --  8.6* 8.6*  HCT  --   --  29.4*  --  27.6* 27.5*  PLT  --   --  92*  --  77* 72*  LABPROT  --   --   --  25.9* 34.4* 37.5*  INR  --   --   --  2.32 3.31 3.69  HEPARINUNFRC 0.34  --   --  0.32 0.39  --   CREATININE  --   --  1.37*  --  1.47* 1.49*  < > = values in this interval not displayed.  Estimated Creatinine Clearance: 30.7 mL/min (by C-G formula based on SCr of 1.49 mg/dL (H)).  Assessment: 82 y.o. M on warfarin PTA for afib and h/o bioprosthetic AVR with goal INR 2-3. Neuro wants new goal INR 2.5-3 as likely embolic phenomena.   PTA Warfarin Dose: 7.5mg  Wed and 5mg  all other days with last dose 10/20 (INR on admission = 2)  INR continues to trend up to 3.69. Will hold dose today x 1. Pt received one dose of Levaquin 500 mg on 10/22.   Goal of Therapy:  INR 2.5-3 per neuro Monitor platelets by anticoagulation protocol: Yes   Plan:  Hold warfarin x 1 dose. Monitor daily INR, CBC, clinical course, s/sx of bleed, PO intake, DDI  Toys 'R' UsKimberly Jamaree Hosier, Pharm.D., BCPS Clinical Pharmacist Pager [phone] 01/03/2016 11:11 AM

## 2016-01-04 ENCOUNTER — Inpatient Hospital Stay (HOSPITAL_COMMUNITY): Payer: Medicare PPO

## 2016-01-04 DIAGNOSIS — M8448XG Pathological fracture, other site, subsequent encounter for fracture with delayed healing: Secondary | ICD-10-CM

## 2016-01-04 DIAGNOSIS — R933 Abnormal findings on diagnostic imaging of other parts of digestive tract: Secondary | ICD-10-CM

## 2016-01-04 DIAGNOSIS — N433 Hydrocele, unspecified: Secondary | ICD-10-CM

## 2016-01-04 LAB — COMPREHENSIVE METABOLIC PANEL
ALT: 12 U/L — AB (ref 17–63)
AST: 18 U/L (ref 15–41)
Albumin: 2.7 g/dL — ABNORMAL LOW (ref 3.5–5.0)
Alkaline Phosphatase: 65 U/L (ref 38–126)
Anion gap: 7 (ref 5–15)
BUN: 16 mg/dL (ref 6–20)
CHLORIDE: 102 mmol/L (ref 101–111)
CO2: 26 mmol/L (ref 22–32)
CREATININE: 1.47 mg/dL — AB (ref 0.61–1.24)
Calcium: 8.8 mg/dL — ABNORMAL LOW (ref 8.9–10.3)
GFR calc non Af Amer: 40 mL/min — ABNORMAL LOW (ref >60)
GFR, EST AFRICAN AMERICAN: 46 mL/min — AB (ref >60)
Glucose, Bld: 91 mg/dL (ref 65–99)
Potassium: 3.5 mmol/L (ref 3.5–5.1)
SODIUM: 135 mmol/L (ref 135–145)
Total Bilirubin: 1 mg/dL (ref 0.3–1.2)
Total Protein: 5.9 g/dL — ABNORMAL LOW (ref 6.5–8.1)

## 2016-01-04 LAB — CBC WITH DIFFERENTIAL/PLATELET
BASOS ABS: 0 10*3/uL (ref 0.0–0.1)
BASOS PCT: 0 %
Eosinophils Absolute: 0 10*3/uL (ref 0.0–0.7)
Eosinophils Relative: 1 %
HEMATOCRIT: 27.2 % — AB (ref 39.0–52.0)
HEMOGLOBIN: 8.5 g/dL — AB (ref 13.0–17.0)
Lymphocytes Relative: 16 %
Lymphs Abs: 0.7 10*3/uL (ref 0.7–4.0)
MCH: 29 pg (ref 26.0–34.0)
MCHC: 31.3 g/dL (ref 30.0–36.0)
MCV: 92.8 fL (ref 78.0–100.0)
Monocytes Absolute: 0.4 10*3/uL (ref 0.1–1.0)
Monocytes Relative: 8 %
NEUTROS ABS: 3.4 10*3/uL (ref 1.7–7.7)
NEUTROS PCT: 75 %
Platelets: 75 10*3/uL — ABNORMAL LOW (ref 150–400)
RBC: 2.93 MIL/uL — AB (ref 4.22–5.81)
RDW: 18.2 % — ABNORMAL HIGH (ref 11.5–15.5)
WBC: 4.5 10*3/uL (ref 4.0–10.5)

## 2016-01-04 LAB — URINALYSIS, ROUTINE W REFLEX MICROSCOPIC
Bilirubin Urine: NEGATIVE
Glucose, UA: NEGATIVE mg/dL
Ketones, ur: NEGATIVE mg/dL
LEUKOCYTES UA: NEGATIVE
NITRITE: NEGATIVE
PROTEIN: 100 mg/dL — AB
Specific Gravity, Urine: >1.046 — ABNORMAL HIGH (ref 1.005–1.030)
pH: 6 (ref 5.0–8.0)

## 2016-01-04 LAB — URINE MICROSCOPIC-ADD ON

## 2016-01-04 LAB — CULTURE, BLOOD (ROUTINE X 2)
CULTURE: NO GROWTH
Culture: NO GROWTH

## 2016-01-04 LAB — PHOSPHORUS: PHOSPHORUS: 1.9 mg/dL — AB (ref 2.5–4.6)

## 2016-01-04 LAB — HEPARIN INDUCED PLATELET AB (HIT ANTIBODY): Heparin Induced Plt Ab: 0.354 OD (ref 0.000–0.400)

## 2016-01-04 LAB — PROTIME-INR
INR: 2.9
PROTHROMBIN TIME: 30.9 s — AB (ref 11.4–15.2)

## 2016-01-04 LAB — MAGNESIUM: Magnesium: 1.7 mg/dL (ref 1.7–2.4)

## 2016-01-04 MED ORDER — DIPHENHYDRAMINE HCL 25 MG PO CAPS
25.0000 mg | ORAL_CAPSULE | Freq: Every evening | ORAL | Status: DC | PRN
  Administered 2016-01-05 – 2016-01-07 (×4): 25 mg via ORAL
  Filled 2016-01-04 (×5): qty 1

## 2016-01-04 MED ORDER — DOCUSATE SODIUM 100 MG PO CAPS
100.0000 mg | ORAL_CAPSULE | Freq: Two times a day (BID) | ORAL | Status: DC
  Administered 2016-01-04 – 2016-01-08 (×9): 100 mg via ORAL
  Filled 2016-01-04 (×9): qty 1

## 2016-01-04 MED ORDER — FERROUS SULFATE 75 (15 FE) MG/ML PO SOLN
15.0000 mg | Freq: Every day | ORAL | Status: DC
  Administered 2016-01-05 – 2016-01-08 (×4): 15 mg via ORAL
  Filled 2016-01-04 (×5): qty 1

## 2016-01-04 MED ORDER — WARFARIN SODIUM 5 MG PO TABS
5.0000 mg | ORAL_TABLET | Freq: Once | ORAL | Status: AC
  Administered 2016-01-04: 5 mg via ORAL
  Filled 2016-01-04: qty 1

## 2016-01-04 MED ORDER — TECHNETIUM TC 99M EXAMETAZIME IV KIT
9.2000 | PACK | Freq: Once | INTRAVENOUS | Status: AC | PRN
  Administered 2016-01-04: 9.2 via INTRAVENOUS

## 2016-01-04 NOTE — Progress Notes (Signed)
PROGRESS NOTE    Ethan Lawrence  NOB:[id number] DOB: 03/22/1922 DOA: 12/29/2015 PCP: PROVIDER NOT IN SYSTEM   Brief Narrative:  Ethan Lawrence is an 82 y.o. male with a PMH of atrial fibrillation on Coumadin, heart block with pacemaker placement, hypertension, hyperlipidemia, coronary artery disease, prosthetic heart valve, CHF and AAA, transferred from [facility] for management of strokelike symptoms on 12/29/15. He has a history of bacteremia and heart valve vegetations in 2016 as well as recurrence in March 2017. He had an acute febrile illness earlier this week which was thought to be due to an indwelling PICC line. PICC line was removed. He had a CT scan of his head which showed no acute intracranial abnormality. MRI cannot be obtained because patient has a cardiac pacemaker. INR was 2.0 on admission. Patient currently being worked up for recurrent Strep Bacteremia which was found at [facility] last week.   Assessment & Plan:   Principal Problem:   Acute ischemic stroke (HCC) Active Problems:   Normocytic anemia   Chronic a-fib on Coumadin   CAD (coronary artery disease) s/p CABG   Essential hypertension   Chronic diastolic heart failure ([place])   Long term current use of anticoagulant therapy   Pacemaker   CRBSI (catheter-related bloodstream infection), subsequent encounter   History of endocarditis   Acute CVA (cerebrovascular accident) ([place])   Left knee pain   Thrombocytopenia ([place])  Likely Acute ischemic stroke [facility]) -Neurology consulted on admission.  -Full stroke workup initiated.  -Continue telemetry.  -LDL at goal at 53, continue Zocor.  -Follow-up hemoglobin A1c.  -CT scan obtained which showed moderate chronic microvascular changes without evidence of a large acute infarct. Mild global atrophy.  -2-D echo showed an EF of 40-45 percent, moderate aortic stenosis, no source of embolism.  -No MRI because of  Permanent Pacer -Right Carotid Duplex prelim report showed mild disease with 1-39% right ICA plaquing. Bilateral vertebral artery flow is antegrade. Unable to scan left carotid secondary to patient's inability to keep head turned. -TEE showed no evidence of endocarditis. There was no evidence of a vegetation. -PT/OT consulted and recommend SNF -Continue to follow SLP reccs  Recurrent Strep Bacteremia CRBSI (catheter-related bloodstream infection), subsequent encounter/history of bacterial endocarditis -Was found to be Bacteremic on 10/15 and 10/17 with Strep Species at [place] was removed then which was previously retained since August. Cath Tip Had Yeast on 10/16.  -Appreciate ID recommendations.  -He has had recurrent strep bacteremia with unclear source. (had Strep Viridans in Decemeber 2016) -TEE Negative with no evidence of endocarditis or evidence of vegetation. -Will consider all etiologies given that he has a pacemaker, artificial joints including his left knee, and a history of aorto iliac stents.  -CTA Abdomen with Contrast to evaluate Aorto-iliac stents showed Aorto bi-iliac stent graft patency. 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 Endoleak cannot be excluded by this study. The non-Vascular portion showed An L4 compression fracture has progressed. There has also been some resorption at the L3 inferior endplate. An inflammatory or infectious process process are not excluded such as osteomyelits.  -Will Consider Vascular Consult for possible ?Endoleak -MRI of spine unable to be done because patient has PPM -Tagged WBC Scan showed Negative whole-body leukocyte scan -Possible source was previously retained PICC Line -Antibiotics switched from Teflaro to ceftriaxone by ID 01/01/16. Eraxis added for antifungal coverage. -Will place PICC Line and continue IV  Ceftriaxone daily for 6 weeks per ID's Recommendation  Scrotal  Swelling/Inguinal Hernia -Pelvis Limited U/S and Scrotal Ultrasound showed Unremarkable testicles with Doppler detected flow bilaterally. Small bilateral hydroceles. Left-sided varicoceles. Fatty tissue versus less likely small amount of blood clot superior to the testicles.  -U/A was Negative and Urine Culture pending  Left knee pain -H/O TKR.   -Joint mildly swollen and warm to touch but no erythema .   -Plain films showed no acute osseous findings or evidence of hardware loosening, but did show a knee joint effusion with increased density in Hoffa's fat.   -ESR not elevated. Unable to get IR to perform arthrocentesis secondary to being on blood thinners.  -Dr. Rockne Menghini discussed with Orthopedic physician on call regarding need for joint aspiration.  -Dr. Alvan Dame Aspirated joint and aspirate seemed to have hemarthrosis from fall and coumadin use.  -Clotted aspirate so unable to appreciate Cell Count -Aspirate showed Red color, with Turbid Appearance, No Crystals were seen. WBC unable to be performed. Neutrophils were 92%, Lymphocytes 14%, Monocytes were 4% -Culture showed no growth in 2 days.  -Continue to Monitor  Dyspnea -Resolved.  -Chest x-ray showed a left lower lobe early infiltrate versus atelectasis.  -Afebrile. No evidence of pneumonia clinically.  Coronary artery disease status post CABG Continue Statin. Anticoagulated with Coumadin. Patient to restart Coumadin tonight.  Chronic diastolic CHF, NYHA class II Compensated at this time and currently not in exacerbation.  History of Complete Heart Block s/p Pacemaker -Continue Telemetry -Patient is followed by Dr. Lovena Le who reports his medtronic device is programmed VVIR. 9 years on the battery.  Aortic Stenosis s/p Aortic Valve replacement Goal INR 2.5 (2.90 today). -Continued on Coumadin tonight; Pharmacy to Dose.   Chronic A-fib on Coumadin -INR was supratherapeutic, heparin discontinued 01/01/16. -On  Telemetry -Coumadin to be restarted and given tonight -Repeat INR in AM  Essential Hypertension Not currently on antihypertensive therapies. Blood pressure not elevated.  Normocytic Anemia -Suspect anemia of chronic disease.  -No current indication for transfusion. -Hb/Hct was stable at 8.6/27.5 -Continue to monitor CBC  Thrombocytopenia -Platelets went from 77 -> 72 -> 75 -Question related to blood thinners or possibly smoldering infection with Strep Bactermia.  -Follow-up HIT panel. -Repeat CBC  Tinea corporis/Onychomycosis Topical antifungals ordered.  CKD Stage 3 -Review of previous GFR and Creatine shows basline is around 1.5/1.6 at baseline -Stable.  -Continue to Monitor BMP  DVT prophylaxis: Anticoagulated with Warfarin Code Status: Full Family Communication: Discussed Plan of Care with [name] Daughter and Granddaughter who were present at bedside.  Disposition Plan: SNF when ready  Consultants:   Neurology  Cardiology  Infectious Diseases  Orthopedics   Procedures: CTA of Abdomen, Tagged WBC Scan; Ultrasound of Scrotum and Pelvis, TEE  Antimicrobials:   Levaquin 12/30/15--->12/31/15  Teflaro 12/31/15--->01/01/16  Rocephin 01/01/16--->  Eraxis 01/02/16--->  Subjective: Patient was seen and examined at bedside this AM and he had no active complaints or concerns and denied Cp, SOB, N/V or Lightheadedness. Family was concerned about his his po Iron and wished to change it to liquid form. Was also concerned about bowel regimen so Docusate was scheduled BID. They also were concerned about his sleep last night so Benadryl was started. Family wants everything done for the patient and are awaiting the results of all the examinations.   Objective: Vitals:   01/04/16 1335 01/04/16 1654 01/04/16 1656 01/04/16 1657  BP: (!) 80/60 (!) 120/25 115/60 (!) 108/43  Pulse: 80 99 85 78  Resp: 18  16  15  Temp: 98.2 F (36.8 C)   98.1 F (36.7 C)  TempSrc:  Oral Oral  Oral  SpO2: 100% 97%  99%  Weight:      Height:        Intake/Output Summary (Last 24 hours) at 01/04/16 1940 Last data filed at 01/04/16 1701  Gross per 24 hour  Intake              240 ml  Output              300 ml  Net              -60 ml   Filed Weights   12/30/15 0005  Weight: 67.3 kg (148 lb 5.9 oz)    Examination: Physical Exam:  Constitutional: Thin elderly gentleman in NAD Eyes:  Lids and conjunctivae normal, sclerae anicteric  ENMT: External Ears, Nose appear normal. Grossly normal hearing.  Neck: Appears normal, supple, no cervical masses, normal ROM, no appreciable thyromegaly Respiratory: Clear to auscultation bilaterally, no wheezing, rales, rhonchi or crackles. Normal respiratory effort and patient is not tachypenic. No accessory muscle use.  Cardiovascular: Irregularly Irregular, 3/6 Systolic Murmur appreciated. No rubs / gallops. S1 and S2 auscultated. No extremity edema. 2+ pedal pulses. No carotid bruits.  Abdomen: Soft, Mildly tender lower abdomen, non-distended. No masses palpated. No appreciable hepatosplenomegaly. Bowel sounds positive.  GU: Mild scrotal swelling with possible Left hernia. Musculoskeletal: No clubbing / cyanosis of digits/nails. No joint deformity upper and lower extremities. Warm and mildly swollen Left knee.  Skin: No rashes, lesions, ulcers. No induration; Warm and dry. Bilateral Bruising in upper arms noted. Bruising on Left foot as well noticed.  Neurologic: CN 2-12 grossly intact with no focal deficits. Romberg sign cerebellar reflexes not assessed.  Psychiatric: Normal judgment and insight. Alert and oriented x 3. Normal mood and appropriate affect.   Data Reviewed: I have personally reviewed following labs and imaging studies  CBC:  Recent Labs Lab 12/31/15 0246 01/01/16 0500 01/02/16 0357 01/03/16 0440 01/04/16 0457  WBC 4.8 5.2 4.7 5.2 4.5  NEUTROABS  --   --   --   --  3.4  HGB 8.8* 9.1* 8.6* 8.6* 8.5*  HCT  28.3* 29.4* 27.6* 27.5* 27.2*  MCV 92.8 92.5 92.9 92.9 92.8  PLT 94* 92* 77* 72* 75*   Basic Metabolic Panel:  Recent Labs Lab 12/31/15 0246 01/01/16 0500 01/02/16 0357 01/03/16 0440 01/04/16 0457  NA 136 137 135 137 135  K 3.5 3.7 3.5 4.1 3.5  CL 104 105 102 103 102  CO2 22 25 25 26 26   GLUCOSE 99 145* 97 105* 91  BUN 14 12 12 14 16   CREATININE 1.29* 1.37* 1.47* 1.49* 1.47*  CALCIUM 8.5* 8.9 8.7* 9.0 8.8*  MG  --   --   --   --  1.7  PHOS  --   --   --   --  1.9*   GFR: Estimated Creatinine Clearance: 31.2 mL/min (by C-G formula based on SCr of 1.47 mg/dL (H)). Liver Function Tests:  Recent Labs Lab 01/04/16 0457  AST 18  ALT 12*  ALKPHOS 65  BILITOT 1.0  PROT 5.9*  ALBUMIN 2.7*   No results for input(s): LIPASE, AMYLASE in the last 168 hours. No results for input(s): AMMONIA in the last 168 hours. Coagulation Profile:  Recent Labs Lab 12/31/15 0246 01/01/16 0509 01/02/16 0357 01/03/16 0440 01/04/16 0457  INR 1.75 2.32 3.31 3.69 2.90  Cardiac Enzymes: No results for input(s): CKTOTAL, CKMB, CKMBINDEX, TROPONINI in the last 168 hours. BNP (last 3 results) No results for input(s): PROBNP in the last 8760 hours. HbA1C: No results for input(s): HGBA1C in the last 72 hours. CBG:  Recent Labs Lab 12/30/15 0042 12/30/15 1224 01/01/16 2315  GLUCAP 103* 112* 107*   Lipid Profile: No results for input(s): CHOL, HDL, LDLCALC, TRIG, CHOLHDL, LDLDIRECT in the last 72 hours. Thyroid Function Tests: No results for input(s): TSH, T4TOTAL, FREET4, T3FREE, THYROIDAB in the last 72 hours. Anemia Panel: No results for input(s): VITAMINB12, FOLATE, FERRITIN, TIBC, IRON, RETICCTPCT in the last 72 hours. Sepsis Labs: No results for input(s): PROCALCITON, LATICACIDVEN in the last 168 hours.  Recent Results (from the past 240 hour(s))  Culture, blood (routine x 2)     Status: None   Collection Time: 12/29/15 11:20 PM  Result Value Ref Range Status   Specimen  Description BLOOD RIGHT ARM  Final   Special Requests BOTTLES DRAWN AEROBIC AND ANAEROBIC 5ML  Final   Culture NO GROWTH 5 DAYS  Final   Report Status 01/04/2016 FINAL  Final  Culture, blood (routine x 2)     Status: None   Collection Time: 12/29/15 11:26 PM  Result Value Ref Range Status   Specimen Description BLOOD RIGHT ARM  Final   Special Requests BOTTLES DRAWN AEROBIC AND ANAEROBIC 5ML  Final   Culture NO GROWTH 5 DAYS  Final   Report Status 01/04/2016 FINAL  Final  Culture, blood (routine x 2)     Status: None (Preliminary result)   Collection Time: 01/02/16 12:14 AM  Result Value Ref Range Status   Specimen Description BLOOD LEFT HAND  Final   Special Requests BOTTLES DRAWN AEROBIC AND ANAEROBIC 5CC  Final   Culture NO GROWTH 2 DAYS  Final   Report Status PENDING  Incomplete  Culture, blood (routine x 2)     Status: None (Preliminary result)   Collection Time: 01/02/16 12:21 AM  Result Value Ref Range Status   Specimen Description BLOOD RIGHT ANTECUBITAL  Final   Special Requests BOTTLES DRAWN AEROBIC AND ANAEROBIC 5CC  Final   Culture NO GROWTH 2 DAYS  Final   Report Status PENDING  Incomplete  Body fluid culture     Status: None (Preliminary result)   Collection Time: 01/02/16 12:27 PM  Result Value Ref Range Status   Specimen Description FLUID LEFT KNEE  Final   Special Requests NONE  Final   Gram Stain   Final    FEW WBC PRESENT, PREDOMINANTLY PMN NO ORGANISMS SEEN    Culture NO GROWTH 2 DAYS  Final   Report Status PENDING  Incomplete    Radiology Studies: US Pelvis Limited  Result Date: 01/03/2016 CLINICAL DATA:  82 year old male with scrotal pain and swelling x3 months. Right groin pain. EXAM: SCROTAL ULTRASOUND DOPPLER ULTRASOUND OF THE TESTICLES TECHNIQUE: Complete ultrasound examination of the testicles, epididymis, and other scrotal structures was performed. Color and spectral Doppler ultrasound were also utilized to evaluate blood flow to the testicles.  COMPARISON:  None. FINDINGS: Right testicle Measurements: 3.8 x 2.2 x 2.3 cm. There is a 4 x 2 x 4 mm intratesticular cyst. The testicle demonstrates a normal echotexture. A rounded echogenic structure in the right scrotum likely represents a scrotal pearl. Left testicle Measurements: 3.0 x 2.1 x 2.5 cm. No mass or microlithiasis visualized. Right epididymis:  2.4 x 0.7 x 2.0 cm.  No hyperemia. Left epididymis: 1.3 x 0.9  x 1.4 cm. No hyperemia.Two small epididymal head cysts noted measuring up to 4 mm. Hyperechoic tissue superior to the testicles likely represent fat and less likely blood clot. Clinical correlation is recommended. Hydrocele: Small bilateral hydroceles containing low-level echogenic debris. Varicocele:  Left-sided varicoceles. Pulsed Doppler interrogation of both testes demonstrates normal low resistance arterial and venous waveforms bilaterally. IMPRESSION: Unremarkable testicles with Doppler detected flow bilaterally. Small bilateral hydroceles. Left-sided varicoceles. Fatty tissue versus less likely small amount of blood clot superior to the testicles. Clinical correlation is recommended. Electronically Signed   By: Anner Crete M.D.   On: 01/03/2016 22:14   US Scrotum  Result Date: 01/03/2016 CLINICAL DATA:  82 year old male with scrotal pain and swelling x3 months. Right groin pain. EXAM: SCROTAL ULTRASOUND DOPPLER ULTRASOUND OF THE TESTICLES TECHNIQUE: Complete ultrasound examination of the testicles, epididymis, and other scrotal structures was performed. Color and spectral Doppler ultrasound were also utilized to evaluate blood flow to the testicles. COMPARISON:  None. FINDINGS: Right testicle Measurements: 3.8 x 2.2 x 2.3 cm. There is a 4 x 2 x 4 mm intratesticular cyst. The testicle demonstrates a normal echotexture. A rounded echogenic structure in the right scrotum likely represents a scrotal pearl. Left testicle Measurements: 3.0 x 2.1 x 2.5 cm. No mass or microlithiasis  visualized. Right epididymis:  2.4 x 0.7 x 2.0 cm.  No hyperemia. Left epididymis: 1.3 x 0.9 x 1.4 cm. No hyperemia.Two small epididymal head cysts noted measuring up to 4 mm. Hyperechoic tissue superior to the testicles likely represent fat and less likely blood clot. Clinical correlation is recommended. Hydrocele: Small bilateral hydroceles containing low-level echogenic debris. Varicocele:  Left-sided varicoceles. Pulsed Doppler interrogation of both testes demonstrates normal low resistance arterial and venous waveforms bilaterally. IMPRESSION: Unremarkable testicles with Doppler detected flow bilaterally. Small bilateral hydroceles. Left-sided varicoceles. Fatty tissue versus less likely small amount of blood clot superior to the testicles. Clinical correlation is recommended. Electronically Signed   By: Anner Crete M.D.   On: 01/03/2016 22:14   Nm Wbc Scan Tumor  Result Date: 01/04/2016 CLINICAL DATA:  Fever of unknown origin. EXAM: NUCLEAR MEDICINE LEUKOCYTE SCAN TECHNIQUE: Following intravenous administration of radiolabeled white blood cells, images of the head, neck, trunk, and extremities were obtained on subsequent days. RADIOPHARMACEUTICALS:  9.2 technetium Ceretec COMPARISON:  CT scan 01/03/2016 and chest x-ray 12/30/2015 FINDINGS: Diffuse symmetric uptake in the lungs is likely due to early phase imaging. No focal uptake in the lungs to suggest pneumonia or abscess. Normal expected uptake in the liver and spleen and normal activity in the bladder. No areas of abnormal activity to suggest a source of infection. IMPRESSION: Negative whole-body leukocyte scan. Electronically Signed   By: Marijo Sanes M.D.   On: 01/04/2016 17:05   [place] Art/ven Flow Abd Pelv Doppler  Result Date: 01/03/2016 CLINICAL DATA:  82 year old male with scrotal pain and swelling x3 months. Right groin pain. EXAM: SCROTAL ULTRASOUND DOPPLER ULTRASOUND OF THE TESTICLES TECHNIQUE: Complete ultrasound examination of the  testicles, epididymis, and other scrotal structures was performed. Color and spectral Doppler ultrasound were also utilized to evaluate blood flow to the testicles. COMPARISON:  None. FINDINGS: Right testicle Measurements: 3.8 x 2.2 x 2.3 cm. There is a 4 x 2 x 4 mm intratesticular cyst. The testicle demonstrates a normal echotexture. A rounded echogenic structure in the right scrotum likely represents a scrotal pearl. Left testicle Measurements: 3.0 x 2.1 x 2.5 cm. No mass or microlithiasis visualized. Right epididymis:  2.4 x 0.7  x 2.0 cm.  No hyperemia. Left epididymis: 1.3 x 0.9 x 1.4 cm. No hyperemia.Two small epididymal head cysts noted measuring up to 4 mm. Hyperechoic tissue superior to the testicles likely represent fat and less likely blood clot. Clinical correlation is recommended. Hydrocele: Small bilateral hydroceles containing low-level echogenic debris. Varicocele:  Left-sided varicoceles. Pulsed Doppler interrogation of both testes demonstrates normal low resistance arterial and venous waveforms bilaterally. IMPRESSION: Unremarkable testicles with Doppler detected flow bilaterally. Small bilateral hydroceles. Left-sided varicoceles. Fatty tissue versus less likely small amount of blood clot superior to the testicles. Clinical correlation is recommended. Electronically Signed   By: Anner Crete M.D.   On: 01/03/2016 22:14   Ct Angio Abd/pel W/ And/or W/o  Result Date: 01/03/2016 CLINICAL DATA:  Infection EXAM: CTA ABDOMEN AND PELVIS wITHOUT AND WITH CONTRAST TECHNIQUE: Multidetector CT imaging of the abdomen and pelvis was performed using the standard protocol during bolus administration of intravenous contrast. Multiplanar reconstructed images and MIPs were obtained and reviewed to evaluate the vascular anatomy. CONTRAST:  100 cc Isovue 370 COMPARISON:  02/25/2015 FINDINGS: VASCULAR Aorta: Aorto bi-iliac stent graft remains in place. Maximal sac diameter is 6.3 cm. Previous maximal sac  diameter was 5.6 cm. Precontrast images were not included. Endoleak cannot be excluded by this study. The aortic and iliac limbs are patent. Celiac: Patent. SMA: Patent. Renals: Single right renal artery is patent. Single left renal artery is patent. IMA: Origin is occluded.  Branches reconstitute. Inflow: Native distal right common iliac artery is patent. Right internal and external iliac arteries are patent. Native left common iliac artery is patent. There is mild disease at the origin of the left internal iliac artery. It is ectatic with a maximal diameter of 1.3 cm. There is focal 50% narrowing of the distal left external iliac artery. It is otherwise patent. Proximal Outflow: Proximal femoral arteries are grossly patent. Veins: There are no venous phase images on this study. Venous structures are non-opacified. Review of the MIP images confirms the above findings. NON-VASCULAR Lower chest: Small bilateral pleural effusions. Bibasilar ground-glass opacities are likely related to volume loss. The heart is moderately enlarged. Hepatobiliary: Calcified granulomata are seen in the liver. Postcholecystectomy. Pancreas: Unremarkable Spleen: Calcified granulomata are scattered throughout the spleen. Adrenals/Urinary Tract: Simple cysts in the right kidney. previously visualized large cyst emanating from the lower pole of the left kidney has markedly reduced in size. Kidneys are somewhat atrophic. Adrenal glands are stable in appearance. Stomach/Bowel: There is irregular wall thickening within the region of the antrum of the stomach. See image 74. Along the anterior wall, there is a polypoid filling defect. Adenocarcinoma cannot be excluded. Surgical staples are seen about the stomach. No obvious focal mass involving the colon. Lymphatic: No abnormal retroperitoneal adenopathy. Reproductive: Bladder is unremarkable.  Status post TURP. Other: There is no free fluid layering in the pelvis. Musculoskeletal: Advanced L4  compression fracture has slightly progressed since the prior study. The inferior endplate of L3 has an abnormal appearance. There has been subtle resort shin of some of the bone compared with the prior study. This may be posttraumatic. An inflammatory process is not excluded. IMPRESSION: VASCULAR Aorto bi-iliac stent graft is patent as described above. 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 Endoleak cannot be excluded by this study. NON-VASCULAR There is a polypoid filling defect along the anterior wall of the antrum of the stomach. Adenocarcinoma is not excluded. Endoscopy is recommended. An L4 compression fracture has  progressed. There has also been some resorption at the L3 inferior endplate. An inflammatory or infectious process process are not excluded. Consider MRI to further evaluate. Electronically Signed   By: Marybelle Killings M.D.   On: 01/03/2016 17:05    Scheduled Meds: . cefTRIAXone (ROCEPHIN)  IV  2 g Intravenous Q24H  . docusate sodium  100 mg Oral BID  . ferrous sulfate  15 mg of iron Oral Daily  . folic acid  1 mg Oral Daily  . predniSONE  5 mg Oral Q breakfast  . rOPINIRole  2 mg Oral Daily  . simvastatin  20 mg Oral QHS  . Warfarin - Pharmacist Dosing Inpatient   Does not apply q1800   Continuous Infusions:    LOS: 6 days   Kerney Elbe, DO [facility] Pager [phone]  If 7PM-7AM, please contact night-coverage [email or web address] Password [email or web address] 01/04/2016, 7:40 PM

## 2016-01-04 NOTE — Progress Notes (Signed)
[facility]    Date of Admission:  12/29/2015   Total days of antibiotics 7        Day 3 ceftriaxone                 ID: Ethan Lawrence is a 82 y.o. male with hx of CAD s/p CABG, AS s/p AVR, hx of streptococcal PVE and NVE, s/p pacemaker, who was admitted with stroke like symptoms and recent streptococcal bacteremia.  Principal Problem:   Acute ischemic stroke [facility]) Active Problems:   Normocytic anemia   Chronic a-fib on Coumadin   CAD (coronary artery disease) s/p CABG   Essential hypertension   Chronic diastolic heart failure (HCC)   Long term current use of anticoagulant therapy   Pacemaker   CRBSI (catheter-related bloodstream infection), subsequent encounter   History of endocarditis   Acute CVA (cerebrovascular accident) (HCC)   Left knee pain   Thrombocytopenia (HCC)   Subjective: Patient was seen and examined this afternoon. He clinically looks improved from when I saw him 2 days ago. He denies any complaints.   Medications:  . cefTRIAXone (ROCEPHIN)  IV  2 g Intravenous Q24H  . docusate sodium  100 mg Oral BID  . ferrous sulfate  15 mg of iron Oral Daily  . folic acid  1 mg Oral Daily  . predniSONE  5 mg Oral Q breakfast  . rOPINIRole  2 mg Oral Daily  . simvastatin  20 mg Oral QHS  . warfarin  5 mg Oral ONCE-1800  . Warfarin - Pharmacist Dosing Inpatient   Does not apply q1800    Objective: Vitals:   01/04/16 0514 01/04/16 1031 01/04/16 1032 01/04/16 1335  BP: 106/73 (!) 95/36 (!) 97/35 (!) 80/60  Pulse: 97  87 80  Resp: 16  16 18   Temp: 98.4 F (36.9 C)  98.3 F (36.8 C) 98.2 F (36.8 C)  TempSrc: Oral  Oral Oral  SpO2: 96%  96% 100%  Weight:      Height:       General: Vital signs reviewed.  Patient is chronically ill appearing, elderly, in no acute distress and cooperative with exam.  Cardiovascular: Irregular rhythm, regular rate, 2/6 systolic ejection murmur. Pulmonary/Chest: Clear to auscultation  bilaterally, no wheezes, rales, or rhonchi. Abdominal: Soft, non-tender, non-distended, BS + Extremities: No lower extremity edema bilaterally Neurological: Follows commands, answers questions appropriately Skin: Warm, dry and intact. No rashes or erythema. Psychiatric: Normal mood and affect.    Lab Results  Recent Labs  01/03/16 0440 01/04/16 0457  WBC 5.2 4.5  HGB 8.6* 8.5*  HCT 27.5* 27.2*  NA 137 135  K 4.1 3.5  CL 103 102  CO2 26 26  BUN 14 16  CREATININE 1.49* 1.47*   Microbiology: 8/15 Blood Cx: Strep constellatus 8/21 Blood Cx: Strep constellatus 8/23 Blood Cx: Strep constellatus 8/24 Abdominal Fluid Cx: No growth final 8/28 Blood Cx: No growth final  10/15 Blood Cx: Strep species (microbiology lab cannot ID, currently being sent to [facility] for identification) 10/15 Urine Cx: Normal flora 10/16 PICC Cath Tip: Yeast (was not identified, requires send out lab and their microbiology lab did not have an order to ID) 10/17 Blood Cx (1/2): Strep species (being sent to [facility] for identification) 10/18 Blood Cx: No growth to date  Blood cx 10/20 >> NGTD Blood cx 10/24 >> NGTD Urine Cx >> pending Left Knee Fluid Cx 10/24 >> No growth, no organisms seen  Studies/Results: [place] Pelvis Limited  Result Date: 01/03/2016 CLINICAL DATA:  82 year old male with scrotal pain and swelling x3 months. Right groin pain. EXAM: SCROTAL ULTRASOUND DOPPLER ULTRASOUND OF THE TESTICLES TECHNIQUE: Complete ultrasound examination of the testicles, epididymis, and other scrotal structures was performed. Color and spectral Doppler ultrasound were also utilized to evaluate blood flow to the testicles. COMPARISON:  None. FINDINGS: Right testicle Measurements: 3.8 x 2.2 x 2.3 cm. There is a 4 x 2 x 4 mm intratesticular cyst. The testicle demonstrates a normal echotexture. A rounded echogenic structure in the right scrotum likely represents a scrotal pearl. Left testicle Measurements: 3.0 x 2.1 x  2.5 cm. No mass or microlithiasis visualized. Right epididymis:  2.4 x 0.7 x 2.0 cm.  No hyperemia. Left epididymis: 1.3 x 0.9 x 1.4 cm. No hyperemia.Two small epididymal head cysts noted measuring up to 4 mm. Hyperechoic tissue superior to the testicles likely represent fat and less likely blood clot. Clinical correlation is recommended. Hydrocele: Small bilateral hydroceles containing low-level echogenic debris. Varicocele:  Left-sided varicoceles. Pulsed Doppler interrogation of both testes demonstrates normal low resistance arterial and venous waveforms bilaterally. IMPRESSION: Unremarkable testicles with Doppler detected flow bilaterally. Small bilateral hydroceles. Left-sided varicoceles. Fatty tissue versus less likely small amount of blood clot superior to the testicles. Clinical correlation is recommended. Electronically Signed   By: Elgie CollardArash  Radparvar M.D.   On: 01/03/2016 22:14   [place] Scrotum  Result Date: 01/03/2016 CLINICAL DATA:  82 year old male with scrotal pain and swelling x3 months. Right groin pain. EXAM: SCROTAL ULTRASOUND DOPPLER ULTRASOUND OF THE TESTICLES TECHNIQUE: Complete ultrasound examination of the testicles, epididymis, and other scrotal structures was performed. Color and spectral Doppler ultrasound were also utilized to evaluate blood flow to the testicles. COMPARISON:  None. FINDINGS: Right testicle Measurements: 3.8 x 2.2 x 2.3 cm. There is a 4 x 2 x 4 mm intratesticular cyst. The testicle demonstrates a normal echotexture. A rounded echogenic structure in the right scrotum likely represents a scrotal pearl. Left testicle Measurements: 3.0 x 2.1 x 2.5 cm. No mass or microlithiasis visualized. Right epididymis:  2.4 x 0.7 x 2.0 cm.  No hyperemia. Left epididymis: 1.3 x 0.9 x 1.4 cm. No hyperemia.Two small epididymal head cysts noted measuring up to 4 mm. Hyperechoic tissue superior to the testicles likely represent fat and less likely blood clot. Clinical correlation is  recommended. Hydrocele: Small bilateral hydroceles containing low-level echogenic debris. Varicocele:  Left-sided varicoceles. Pulsed Doppler interrogation of both testes demonstrates normal low resistance arterial and venous waveforms bilaterally. IMPRESSION: Unremarkable testicles with Doppler detected flow bilaterally. Small bilateral hydroceles. Left-sided varicoceles. Fatty tissue versus less likely small amount of blood clot superior to the testicles. Clinical correlation is recommended. Electronically Signed   By: Elgie CollardArash  Radparvar M.D.   On: 01/03/2016 22:14   [place] Art/ven Flow Abd Pelv Doppler  Result Date: 01/03/2016 CLINICAL DATA:  82 year old male with scrotal pain and swelling x3 months. Right groin pain. EXAM: SCROTAL ULTRASOUND DOPPLER ULTRASOUND OF THE TESTICLES TECHNIQUE: Complete ultrasound examination of the testicles, epididymis, and other scrotal structures was performed. Color and spectral Doppler ultrasound were also utilized to evaluate blood flow to the testicles. COMPARISON:  None. FINDINGS: Right testicle Measurements: 3.8 x 2.2 x 2.3 cm. There is a 4 x 2 x 4 mm intratesticular cyst. The testicle demonstrates a normal echotexture. A rounded echogenic structure in the right scrotum likely represents a scrotal pearl. Left testicle Measurements: 3.0 x 2.1 x 2.5 cm. No mass or microlithiasis  visualized. Right epididymis:  2.4 x 0.7 x 2.0 cm.  No hyperemia. Left epididymis: 1.3 x 0.9 x 1.4 cm. No hyperemia.Two small epididymal head cysts noted measuring up to 4 mm. Hyperechoic tissue superior to the testicles likely represent fat and less likely blood clot. Clinical correlation is recommended. Hydrocele: Small bilateral hydroceles containing low-level echogenic debris. Varicocele:  Left-sided varicoceles. Pulsed Doppler interrogation of both testes demonstrates normal low resistance arterial and venous waveforms bilaterally. IMPRESSION: Unremarkable testicles with Doppler detected flow  bilaterally. Small bilateral hydroceles. Left-sided varicoceles. Fatty tissue versus less likely small amount of blood clot superior to the testicles. Clinical correlation is recommended. Electronically Signed   By: Elgie Collard M.D.   On: 01/03/2016 22:14   Ct Angio Abd/pel W/ And/or W/o  Result Date: 01/03/2016 CLINICAL DATA:  Infection EXAM: CTA ABDOMEN AND PELVIS wITHOUT AND WITH CONTRAST TECHNIQUE: Multidetector CT imaging of the abdomen and pelvis was performed using the standard protocol during bolus administration of intravenous contrast. Multiplanar reconstructed images and MIPs were obtained and reviewed to evaluate the vascular anatomy. CONTRAST:  100 cc Isovue 370 COMPARISON:  02/25/2015 FINDINGS: VASCULAR Aorta: Aorto bi-iliac stent graft remains in place. Maximal sac diameter is 6.3 cm. Previous maximal sac diameter was 5.6 cm. Precontrast images were not included. Endoleak cannot be excluded by this study. The aortic and iliac limbs are patent. Celiac: Patent. SMA: Patent. Renals: Single right renal artery is patent. Single left renal artery is patent. IMA: Origin is occluded.  Branches reconstitute. Inflow: Native distal right common iliac artery is patent. Right internal and external iliac arteries are patent. Native left common iliac artery is patent. There is mild disease at the origin of the left internal iliac artery. It is ectatic with a maximal diameter of 1.3 cm. There is focal 50% narrowing of the distal left external iliac artery. It is otherwise patent. Proximal Outflow: Proximal femoral arteries are grossly patent. Veins: There are no venous phase images on this study. Venous structures are non-opacified. Review of the MIP images confirms the above findings. NON-VASCULAR Lower chest: Small bilateral pleural effusions. Bibasilar ground-glass opacities are likely related to volume loss. The heart is moderately enlarged. Hepatobiliary: Calcified granulomata are seen in the liver.  Postcholecystectomy. Pancreas: Unremarkable Spleen: Calcified granulomata are scattered throughout the spleen. Adrenals/Urinary Tract: Simple cysts in the right kidney. previously visualized large cyst emanating from the lower pole of the left kidney has markedly reduced in size. Kidneys are somewhat atrophic. Adrenal glands are stable in appearance. Stomach/Bowel: There is irregular wall thickening within the region of the antrum of the stomach. See image 74. Along the anterior wall, there is a polypoid filling defect. Adenocarcinoma cannot be excluded. Surgical staples are seen about the stomach. No obvious focal mass involving the colon. Lymphatic: No abnormal retroperitoneal adenopathy. Reproductive: Bladder is unremarkable.  Status post TURP. Other: There is no free fluid layering in the pelvis. Musculoskeletal: Advanced L4 compression fracture has slightly progressed since the prior study. The inferior endplate of L3 has an abnormal appearance. There has been subtle resort shin of some of the bone compared with the prior study. This may be posttraumatic. An inflammatory process is not excluded. IMPRESSION: VASCULAR Aorto bi-iliac stent graft is patent as described above. 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 Endoleak cannot be excluded by this study. NON-VASCULAR There is a polypoid filling defect along the anterior wall of the antrum of the stomach. Adenocarcinoma is not excluded. Endoscopy  is recommended. An L4 compression fracture has progressed. There has also been some resorption at the L3 inferior endplate. An inflammatory or infectious process process are not excluded. Consider MRI to further evaluate. Electronically Signed   By: Jolaine Click M.D.   On: 01/03/2016 17:05    Assessment/Plan: Mr. Maske is a 4 M with PMHx of CAD s/p CABG, AS s/p AVR, hx of streptococcal PVE and NVE, s/p pacemaker, who was admitted with stroke like symptoms and recent  streptococcal bacteremia.  Recurrent Streptococcal Bacteremia: Patient has been afebrile and without leukocytosis. Patient is currently on Ceftriaxone Q12H to provide coverage for possible CNS septic emboli. TEE was negative for vegetations. CTA of abdomen showed Aorto bi-iliac stent graft patency. 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 Endoleak cannot be excluded by this study. Recommend tagged wbc looking for possible infectious involvement of his vascular stent.  -Continue Ceftriaxone -Recommend tagged wbc looking for possible infectious involvement of his vascular stent -Blood cx 10/20 >> NGTD -Blood cx 10/24 >> NGTD -Urine Cx >> pending -Left Knee Fluid Cx 10/24 >> No growth, no organisms seen  Polypoid Filling Defect: Seen on CTA along the anterior wall of the antrum of the stomach. Adenocarcinoma is not excluded. Endoscopy is recommended. 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  L4 Compression Fracture: CT showed fracture has progressed with resorption at the L3 inferior endplate. An inflammatory or infectious process process are not excluded.  -Consider MRI to further evaluate  Urinary Frequency: Possibly due to BPH. UA showed rare bacteria, negative leukocytes or nitrites and 0-5 WBC unconvincing for infection. Pelvic/Scrotal US showed unremarkable testicles with Doppler detected flow bilaterally. Small bilateral hydroceles. Left-sided varicoceles. Fatty tissue versus less likely small amount of blood clot superior to the testicles.  -UCx pending  Karlene Lineman, DO PGY-3 Internal Medicine Resident Pager # [phone] 01/04/2016 3:29 PM

## 2016-01-04 NOTE — NC FL2 (Signed)
[place] MEDICAID FL2 LEVEL OF CARE SCREENING TOOL     IDENTIFICATION  Patient Name: Ethan Lawrence Birthdate: Feb 16, 1923 Sex: male Admission Date (Current Location): 12/29/2015  [place] Number:   [facility] [place])   Facility and Address:  [facility], [address], [place], [place] [address]      Provider Number: [id number]  Attending Physician Name and Address:  Merlene Laughter, DO  Relative Name and Phone Number:  [name] [phone]    Current Level of Care: Hospital Recommended Level of Care: Skilled Nursing Facility Prior Approval Number:    Date Approved/Denied:   PASRR Number:    Discharge Plan: SNF    Current Diagnoses: Patient Active Problem List   Diagnosis Date Noted  . Thrombocytopenia (HCC) 01/01/2016  . Left knee pain   . Acute CVA (cerebrovascular accident) (HCC) 12/30/2015  . Acute ischemic stroke (HCC) 12/29/2015  . CRBSI (catheter-related bloodstream infection), subsequent encounter 12/29/2015  . History of endocarditis 12/29/2015  . Diskitis   . Lumbar back pain   . Pacemaker infection (HCC)   . Intractable back pain 02/26/2015  . Dyspnea on exertion 02/21/2015  . Mitral regurgitation 02/21/2015  . Pacemaker 09/01/2013  . S/P AVR 03/01/2013  . Chronic diastolic heart failure (HCC) 03/01/2013  . Long term current use of anticoagulant therapy 03/01/2013  . S/P left TKA 02/15/2013  . 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 History of AAA (abdominal aortic aneurysm) repair 06/18/2011  . Normocytic anemia 06/18/2011  . Arthritis 06/18/2011  . Chronic a-fib on Coumadin 06/18/2011  . CAD (coronary artery disease) s/p CABG 06/18/2011  . Essential hypertension 06/18/2011    Orientation RESPIRATION BLADDER Height & Weight     Self, Place, Situation  Normal Continent Weight: 67.3 kg (148 lb 5.9 oz) Height:  6' (182.9 cm)  BEHAVIORAL SYMPTOMS/MOOD NEUROLOGICAL BOWEL NUTRITION STATUS    Continent Diet (Please see DC Summary)  AMBULATORY STATUS COMMUNICATION OF NEEDS Skin   Extensive Assist Verbally Normal                       Personal Care Assistance Level of Assistance  Bathing, Feeding, Dressing Bathing Assistance: Maximum assistance Feeding assistance: Limited assistance Dressing Assistance: Maximum assistance     Functional Limitations Info             SPECIAL CARE FACTORS FREQUENCY  PT (By licensed PT), OT (By licensed OT)     PT Frequency: 5x/week OT Frequency: 3x/week            Contractures      Additional Factors Info  Code Status, Allergies Code Status Info: Full Allergies Info: Tape, Vancomycin           Current Medications (01/04/2016):  This is the current hospital active medication list Current Facility-Administered Medications  Medication Dose Route Frequency Provider Last Rate Last Dose  . acetaminophen (TYLENOL) tablet 650 mg  650 mg Oral Q6H PRN Hillary Bow, DO      . cefTRIAXone (ROCEPHIN) 2 g in dextrose 5 % 50 mL IVPB  2 g Intravenous Q24H Judyann Munson, MD   2 g at 01/04/16 0858  . diphenhydrAMINE (BENADRYL) capsule 25 mg  25 mg Oral QHS PRN Heloise Beecham Sheikh, DO      . docusate sodium (COLACE) capsule 100 mg  100 mg Oral BID Heloise Beecham Sheikh, DO   100 mg at 01/04/16 1149  . ferrous sulfate (FER-IN-SOL) 75 (  15 Fe) MG/ML solution 15 mg of iron  15 mg of iron Oral Daily Omair Latif Sheikh, DO      . folic acid (FOLVITE) tablet 1 mg  1 mg Oral Daily Hillary BowJared M Gardner, DO   1 mg at 01/04/16 1001  . HYDROcodone-acetaminophen (NORCO) 10-325 MG per tablet 1 tablet  1 tablet Oral Q6H PRN Hillary BowJared M Gardner, DO   1 tablet at 01/04/16 0858  . predniSONE (DELTASONE) tablet 5 mg  5 mg Oral Q breakfast Hillary BowJared M Gardner, DO   5 mg at 01/04/16 0858  . rOPINIRole (REQUIP) tablet 2 mg  2 mg Oral Daily Hillary BowJared M Gardner, DO   2 mg at 01/03/16 2244  . simvastatin (ZOCOR) tablet 20 mg  20 mg Oral QHS Hillary BowJared M Gardner, DO   20 mg at  01/03/16 2243  . warfarin (COUMADIN) tablet 5 mg  5 mg Oral ONCE-1800 Kimberly B Hammons, RPH      . Warfarin - Pharmacist Dosing Inpatient   Does not apply q1800 Marquita PalmsCorey M Ball, [facility]istonRPH   Stopped at 01/02/16 1800  . white petrolatum (VASELINE) gel   Topical PRN Maryruth Bunhristina P Rama, MD         Discharge Medications: Please see discharge summary for a list of discharge medications.  Relevant Imaging Results:  Relevant Lab Results:   Additional Information SSN [id number]  Will need IV abx for 6 weeks  Mearl LatinNadia S Rayyan, LCSWA

## 2016-01-04 NOTE — Progress Notes (Signed)
Physical Therapy Treatment Patient Details Name: Providence LaniusCharles E Nhan MRN: [id number] DOB: 1922-09-09 Today's Date: 01/04/2016    History of Present Illness 82 y.o.malewith medical history significant of Prosthetic aortic valve replacement, pacemaker for complete heart block. Pt presenting with acute onset of L sided weakness, aphasia. CT on 10/21 neg for acute infarct.    PT Comments    Pt progressing slowly, limited by fatigue, but also somewhat limited by family intervention to stop before his body and responses to treatment say stop.   Follow Up Recommendations  SNF;Supervision/Assistance - 24 hour     Equipment Recommendations  Rolling walker with 5" wheels    Recommendations for Other Services       Precautions / Restrictions Precautions Precautions: Fall    Mobility  Bed Mobility Overal bed mobility: Needs Assistance Bed Mobility: Supine to Sit     Supine to sit: Min assist Sit to supine: Min guard   General bed mobility comments: scoots to EOB without assist, minor assis to sit up and scoots to EOB without assist.    Transfers Overall transfer level: Needs assistance   Transfers: Sit to/from Stand Sit to Stand: Min assist;+2 safety/equipment         General transfer comment: cues for hand placement, stability assist  Ambulation/Gait Ambulation/Gait assistance: Min assist Ambulation Distance (Feet): 20 Feet Assistive device: Rolling walker (2 wheeled) Gait Pattern/deviations: Step-through pattern;Decreased step length - right;Decreased stance time - left;Decreased stride length Gait velocity: slow Gait velocity interpretation: Below normal speed for age/gender General Gait Details: hemiparetic gait with some trouble clearing either LE   Stairs            Wheelchair Mobility    Modified Rankin (Stroke Patients Only) Modified Rankin (Stroke Patients Only) Pre-Morbid Rankin Score: Moderate disability Modified Rankin: Moderately severe  disability     Balance Overall balance assessment: Needs assistance   Sitting balance-Leahy Scale: Good       Standing balance-Leahy Scale: Poor Standing balance comment: reliant on the rW or outside support                    Cognition   Behavior During Therapy: [facility] for tasks assessed/performed Overall Cognitive Status: Within Functional Limits for tasks assessed                      Exercises      General Comments        Pertinent Vitals/Pain Pain Assessment: No/denies pain    Home Living                      Prior Function            PT Goals (current goals can now be found in the care plan section) Acute Rehab PT Goals PT Goal Formulation: With family Time For Goal Achievement: 01/13/16 Potential to Achieve Goals: Fair Progress towards PT goals: Progressing toward goals    Frequency    Min 3X/week      PT Plan Current plan remains appropriate    Co-evaluation             End of Session   Activity Tolerance: Patient tolerated treatment well;Other (comment) (family and tests limited treatment) Patient left: in bed;with call bell/phone within reach;with family/visitor present     Time: 1450-1505 PT Time Calculation (min) (ACUTE ONLY): 15 min  Charges:  $Gait Training: 8-22 mins  G Codes:      Natia Fahmy, Eliseo Gum 01/04/2016, 3:42 PM 01/04/2016  [place] Bing, PT [phone]  (pager)

## 2016-01-04 NOTE — Progress Notes (Signed)
ANTICOAGULATION CONSULT NOTE - Follow Up Consult  Pharmacy Consult for Warfarin Indication: afib with bioprosthetic AVR; likely new embolic event   Allergies  Allergen Reactions  . Tape Other (See Comments)    Pulls skin off  . Vancomycin Other (See Comments)    Hearing loss    Patient Measurements: Height: 6' (182.9 cm) Weight: 148 lb 5.9 oz (67.3 kg) IBW/kg (Calculated) : 77.6  Heparin Dosing Weight: 70 kg  Vital Signs: Temp: 98.3 F (36.8 C) (10/26 1032) Temp Source: Oral (10/26 1032) BP: 97/35 (10/26 1032) Pulse Rate: 87 (10/26 1032)  Labs:  Recent Labs  01/02/16 0357 01/03/16 0440 01/04/16 0457  HGB 8.6* 8.6* 8.5*  HCT 27.6* 27.5* 27.2*  PLT 77* 72* 75*  LABPROT 34.4* 37.5* 30.9*  INR 3.31 3.69 2.90  HEPARINUNFRC 0.39  --   --   CREATININE 1.47* 1.49* 1.47*    Estimated Creatinine Clearance: 31.2 mL/min (by C-G formula based on SCr of 1.47 mg/dL (H)).  Assessment: 82 y.o. M on warfarin PTA for afib and h/o bioprosthetic AVR with goal INR 2-3. Neuro wants new goal INR 2.5-3 as likely embolic phenomena.   PTA Warfarin Dose: 7.5mg  Wed and  all other days with last dose 10/20 (INR on admission = 2)  INR now within goal after doses held x 2.  Pt received one dose of Levaquin 500 mg on 10/22 potentially causing elevated INR.   Goal of Therapy:  INR 2.5-3 per neuro Monitor platelets by anticoagulation protocol: Yes   Plan:  Warfarin  PO x 1 tonight Monitor daily INR, CBC, clinical course, s/sx of bleed, PO intake, DDI  [employer], Pharm.D., BCPS Clinical Pharmacist Pager [phone] 01/04/2016 10:55 AM

## 2016-01-04 NOTE — Care Management Important Message (Signed)
Important Message  Patient Details  Name: Ethan Lawrence MRN: [id number] Date of Birth: [birth year]   Medicare Important Message Given:  Yes    Riko Lumsden 01/04/2016, 3:08 PM

## 2016-01-04 NOTE — Clinical Social Work Note (Signed)
Clinical Social Work Assessment  Patient Details  Name: Ethan Lawrence MRN: [id number] Date of Birth: 01-15-1923  Date of referral:  01/04/16               Reason for consult:  Facility Placement                Permission sought to share information with:  Facility Sport and exercise psychologist, Family Supports Permission granted to share information::  Yes, Verbal Permission Granted  Name::     Electrical engineer::  SNFs  Relationship::  Spouse  Contact Information:  [phone]  Housing/Transportation Living arrangements for the past 2 months:  Single Family Home Source of Information:  Patient, Adult Children, Spouse Patient Interpreter Needed:  None Criminal Activity/Legal Involvement Pertinent to Current Situation/Hospitalization:  No - Comment as needed Significant Relationships:  Adult Children, Spouse Lives with:  Spouse Do you feel safe going back to the place where you live?  No Need for family participation in patient care:  Yes (Comment)  Care giving concerns:   CSW received consult for possible SNF placement at time of discharge. CSW met with patient and patient's daughter (and her mother) at bedside regarding PT recommendation of SNF placement at time of discharge. Per patient's daughter, they would like for patient to go to rehab to get stronger before returning home. Patient and patient's daughter expressed understanding of PT recommendation and are agreeable to SNF placement at time of discharge. CSW to continue to follow and assist with discharge planning needs.   Social Worker assessment / plan: CSW spoke with patient and patient's daughter concerning possibility of rehab at [facility] before returning home.  Employment status:  Retired Nurse, adult PT Recommendations:  [place] / Referral to community resources:  [place]  Patient/Family's Response to care:  Patient and patient's daughter recognize  need for rehab before returning home and are agreeable to a SNF in [place], though they are from [place]. Patient reported preference for [facility] since they have a family member there.  Patient/Family's Understanding of and Emotional Response to Diagnosis, Current Treatment, and Prognosis:  Patient/family is realistic regarding therapy needs and expressed being hopeful for SNF placement. Patient expressed understanding of CSW role and discharge process. No questions/concerns about plan or treatment.    Emotional Assessment Appearance:  Appears younger than stated age Attitude/Demeanor/Rapport:  Other (Appropriate) Affect (typically observed):  Accepting, Appropriate Orientation:  Oriented to Self, Oriented to Place Alcohol / Substance use:  Not Applicable Psych involvement (Current and /or in the community):  No (Comment)  Discharge Needs  Concerns to be addressed:  Care Coordination Readmission within the last 30 days:  No Current discharge risk:  None Barriers to Discharge:  Continued Medical Work up   [employer], [place] 01/04/2016, 2:25 PM

## 2016-01-05 ENCOUNTER — Inpatient Hospital Stay (HOSPITAL_COMMUNITY): Payer: Medicare PPO

## 2016-01-05 DIAGNOSIS — B954 Other streptococcus as the cause of diseases classified elsewhere: Secondary | ICD-10-CM

## 2016-01-05 DIAGNOSIS — I2690 Septic pulmonary embolism without acute cor pulmonale: Secondary | ICD-10-CM

## 2016-01-05 DIAGNOSIS — Z9889 Other specified postprocedural states: Secondary | ICD-10-CM

## 2016-01-05 LAB — COMPREHENSIVE METABOLIC PANEL
ALBUMIN: 2.7 g/dL — AB (ref 3.5–5.0)
ALT: 13 U/L — ABNORMAL LOW (ref 17–63)
ANION GAP: 9 (ref 5–15)
AST: 18 U/L (ref 15–41)
Alkaline Phosphatase: 71 U/L (ref 38–126)
BUN: 17 mg/dL (ref 6–20)
CHLORIDE: 102 mmol/L (ref 101–111)
CO2: 24 mmol/L (ref 22–32)
Calcium: 8.6 mg/dL — ABNORMAL LOW (ref 8.9–10.3)
Creatinine, Ser: 1.51 mg/dL — ABNORMAL HIGH (ref 0.61–1.24)
GFR calc Af Amer: 45 mL/min — ABNORMAL LOW (ref >60)
GFR calc non Af Amer: 39 mL/min — ABNORMAL LOW (ref >60)
GLUCOSE: 94 mg/dL (ref 65–99)
POTASSIUM: 3.4 mmol/L — AB (ref 3.5–5.1)
Sodium: 135 mmol/L (ref 135–145)
TOTAL PROTEIN: 5.5 g/dL — AB (ref 6.5–8.1)
Total Bilirubin: 0.8 mg/dL (ref 0.3–1.2)

## 2016-01-05 LAB — CBC
HCT: 26.8 % — ABNORMAL LOW (ref 39.0–52.0)
Hemoglobin: 8.4 g/dL — ABNORMAL LOW (ref 13.0–17.0)
MCH: 29.6 pg (ref 26.0–34.0)
MCHC: 31.3 g/dL (ref 30.0–36.0)
MCV: 94.4 fL (ref 78.0–100.0)
PLATELETS: 81 10*3/uL — AB (ref 150–400)
RBC: 2.84 MIL/uL — ABNORMAL LOW (ref 4.22–5.81)
RDW: 18.3 % — AB (ref 11.5–15.5)
WBC: 5.2 10*3/uL (ref 4.0–10.5)

## 2016-01-05 LAB — PROTIME-INR
INR: 2.85
PROTHROMBIN TIME: 30.5 s — AB (ref 11.4–15.2)

## 2016-01-05 LAB — URINE CULTURE: CULTURE: NO GROWTH

## 2016-01-05 LAB — PHOSPHORUS: Phosphorus: 1.6 mg/dL — ABNORMAL LOW (ref 2.5–4.6)

## 2016-01-05 LAB — MAGNESIUM: MAGNESIUM: 1.7 mg/dL (ref 1.7–2.4)

## 2016-01-05 MED ORDER — WARFARIN SODIUM 5 MG PO TABS
5.0000 mg | ORAL_TABLET | Freq: Once | ORAL | Status: AC
  Administered 2016-01-05: 5 mg via ORAL
  Filled 2016-01-05: qty 1

## 2016-01-05 MED ORDER — SODIUM CHLORIDE 0.9% FLUSH
10.0000 mL | INTRAVENOUS | Status: DC | PRN
  Administered 2016-01-06 – 2016-01-08 (×4): 10 mL
  Filled 2016-01-05 (×4): qty 40

## 2016-01-05 NOTE — Clinical Social Work Note (Addendum)
CSW initiated [employer] auth. Currently, clinicals are under review. CSW will continue to follow.   Dede QuerySarah Ramiro Pangilinan, MSW, LCSW  Clinical Social Worker  [phone]  ADDENDUM: Aura FeySARR is pending as well.  Dede QuerySarah Lysbeth Dicola, MSW, LCSW

## 2016-01-05 NOTE — Progress Notes (Signed)
Pt is waiting on the assignment of a PASRR number and insurance authorization for SNF placement.  Pt's PASRR number has been delayed because he is a resident of [place].  All requested information has been sent to [facility] per their review.  CSW met with family to discuss these d/c delays.  Pt's plan remains to go to [facility] at d/c and his admission paperwork has been done at the facility.  Creta Levin, LCSW Evening/ED Coverage [phone]

## 2016-01-05 NOTE — Progress Notes (Signed)
ANTICOAGULATION CONSULT NOTE - Follow Up Consult  Pharmacy Consult for Warfarin Indication: afib with bioprosthetic AVR; likely new embolic event   Allergies  Allergen Reactions  . Tape Other (See Comments)    Pulls skin off  . Vancomycin Other (See Comments)    Hearing loss    Patient Measurements: Height: 6' (182.9 cm) Weight: 148 lb 5.9 oz (67.3 kg) IBW/kg (Calculated) : 77.6  Heparin Dosing Weight: 70 kg  Vital Signs: Temp: 98.6 F (37 C) (10/27 0757) Temp Source: Oral (10/27 0757) BP: 158/64 (10/27 0757) Pulse Rate: 76 (10/27 0757)  Labs:  Recent Labs  01/03/16 0440 01/04/16 0457 01/05/16 0758  HGB 8.6* 8.5* 8.4*  HCT 27.5* 27.2* 26.8*  PLT 72* 75* 81*  LABPROT 37.5* 30.9* 30.5*  INR 3.69 2.90 2.85  CREATININE 1.49* 1.47* 1.51*    Estimated Creatinine Clearance: 30.3 mL/min (by C-G formula based on SCr of 1.51 mg/dL (H)).  Assessment: 82 y.o. M on warfarin PTA for afib and h/o bioprosthetic AVR with goal INR 2-3. Neuro wants new goal INR 2.5-3 as likely embolic phenomena.   PTA Warfarin Dose: 7.5mg  Wed and 5mg  all other days with last dose 10/20 (INR on admission = 2)  INR now within goal after doses held x 2.  Pt received one dose of Levaquin 500 mg on 10/22 potentially causing elevated INR.   Goal of Therapy:  INR 2.5-3 per neuro Monitor platelets by anticoagulation protocol: Yes   Plan:  Warfarin 5mg  PO x 1 tonight Monitor daily INR, CBC, clinical course, s/sx of bleed, PO intake, DDI  Toys 'R' UsKimberly Rhyland Hinderliter, Pharm.D., BCPS Clinical Pharmacist Pager [phone] 01/05/2016 11:38 AM

## 2016-01-05 NOTE — Progress Notes (Signed)
Peripherally Inserted Central Catheter/Midline Placement  The IV Nurse has discussed with the patient and/or persons authorized to consent for the patient, the purpose of this procedure and the potential benefits and risks involved with this procedure.  The benefits include less needle sticks, lab draws from the catheter, and the patient may be discharged home with the catheter. Risks include, but not limited to, infection, bleeding, blood clot (thrombus formation), and puncture of an artery; nerve damage and irregular heartbeat and possibility to perform a PICC exchange if needed/ordered by physician.  Alternatives to this procedure were also discussed.  Bard Power PICC patient education guide, fact sheet on infection prevention and patient information card has been provided to patient /or left at bedside.    PICC/Midline Placement Documentation        Ethan Lawrence, Ethan Lawrence 01/05/2016, 10:10 AM

## 2016-01-05 NOTE — Progress Notes (Signed)
PROGRESS NOTE    Ethan Lawrence  BSJ:[id number] DOB: 23-Nov-1922 DOA: 12/29/2015 PCP: PROVIDER NOT IN SYSTEM   Brief Narrative:  Ethan Lawrence is an 82 y.o. male with a PMH of atrial fibrillation on Coumadin, heart block with pacemaker placement, hypertension, hyperlipidemia, coronary artery disease, prosthetic heart valve, CHF and AAA, transferred from [facility] for management of strokelike symptoms on 12/29/15. He has a history of bacteremia and heart valve vegetations in 2016 as well as recurrence in March 2017. He had an acute febrile illness earlier this week which was thought to be due to an indwelling PICC line. PICC line was removed. He had a CT scan of his head which showed no acute intracranial abnormality. MRI cannot be obtained because patient has a cardiac pacemaker. INR was 2.0 on admission. Patient currently being worked up for recurrent Strep Bacteremia which was found at [facility].  Assessment & Plan:   Principal Problem:   Acute ischemic stroke [facility]) Active Problems:   Normocytic anemia   Chronic a-fib on Coumadin   CAD (coronary artery disease) s/p CABG   Essential hypertension   Chronic diastolic heart failure ([place])   Long term current use of anticoagulant therapy   Pacemaker   CRBSI (catheter-related bloodstream infection), subsequent encounter   History of endocarditis   Acute CVA (cerebrovascular accident) ([place])   Left knee pain   Thrombocytopenia ([place])  Likely Acute CVA -Neurology consulted on admission.  -Full stroke workup initiated.  -Continue telemetry.  -LDL at goal at 53, continue Zocor.  -Follow-up hemoglobin A1c.  -CT scan obtained which showed moderate chronic microvascular changes without evidence of a large acute infarct. Mild global atrophy.  -2-D echo showed an EF of 40-45 percent, moderate aortic stenosis, no source of embolism.  -No MRI because of Permanent Pacer -Right  Carotid Duplex prelim report showed mild disease with 1-39% right ICA plaquing. Bilateral vertebral artery flow is antegrade. Unable to scan left carotid secondary to patient's inability to keep head turned. -TEE showed no evidence of endocarditis. There was no evidence of a vegetation. -Embolic Phenomenon cannot be excluded because of [name] previous Endocarditis from previous bacteremia -PT/OT consulted and recommend SNF -Patient accepted at [facility] however waiting for PASARR number since patient is a Resident of [place]  Recurrent Strep Bacteremia CRBSI (catheter-related bloodstream infection), subsequent encounter/history of bacterial endocarditis -Was found to be Bacteremic on 10/15 and 10/17 with Strep Species at [place] was removed then which was previously retained since August. Cath Tip Had Yeast on 10/16.  -Appreciate ID recommendations.  -He has had recurrent strep bacteremia with unclear source. (had Strep Viridans in Decemeber 2016) -TEE Negative with no evidence of endocarditis or evidence of vegetation. -Will consider all etiologies given that he has a pacemaker, artificial joints including his left knee, and a history of aorto iliac stents.  -CTA Abdomen with Contrast to evaluate Aorto-iliac stents showed Aorto bi-iliac stent graft patency. 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 Endoleak cannot be excluded by this study. The non-Vascular portion showed An L4 compression fracture has progressed. There has also been some resorption at the L3 inferior endplate. An inflammatory or infectious process process are not excluded such as osteomyelits.  -Discussed ? Endoleak with Dr. Donnetta Hutching in Vascular Surgery and stated not to worry and that it was stable.  -MRI of spine unable to be done because patient has PPM -Tagged WBC Scan  showed Negative whole-body leukocyte scan -Possible and probable source was previously retained  PICC Line -Antibiotics switched from Teflaro to ceftriaxone by ID 01/01/16 ->.  -Eraxis Discontinued by ID -Will place PICC Line and continue IV Ceftriaxone daily for 6 weeks per ID's Recommendation (end Date Dec 4).   Scrotal Swelling/Inguinal Hernia -Pelvis Limited U/S and Scrotal Ultrasound showed Unremarkable testicles with Doppler detected flow bilaterally. Small bilateral hydroceles. Left-sided varicoceles. Fatty tissue versus less likely small amount of blood clot superior to the testicles.  -U/A was Negative and Urine Culture pending  Left knee pain -H/O TKR.   -Joint mildly swollen and warm to touch but no erythema .   -Plain films showed no acute osseous findings or evidence of hardware loosening, but did show a knee joint effusion with increased density in Hoffa's fat.   -ESR not elevated. Unable to get IR to perform arthrocentesis secondary to being on blood thinners.  -Dr. Rockne Menghini discussed with Orthopedic physician on call regarding need for joint aspiration.  -Dr. Alvan Dame Aspirated joint and aspirate seemed to have hemarthrosis from fall and coumadin use.  -Clotted aspirate so unable to appreciate Cell Count -Aspirate showed Red color, with Turbid Appearance, No Crystals were seen. WBC unable to be performed. Neutrophils were 92%, Lymphocytes 14%, Monocytes were 4% -Culture showed no growth in 2 days.  -Continue to Monitor  Dyspnea -Resolved.  -Chest x-ray showed a left lower lobe early infiltrate versus atelectasis.  -Afebrile. No evidence of pneumonia clinically.  Coronary artery disease status post CABG Continue Statin. Anticoagulated with Coumadin. Patient to restart Coumadin tonight.  Chronic diastolic CHF, NYHA class II Compensated at this time and currently not in exacerbation.  History of Complete Heart Block s/p Pacemaker -Continue Telemetry -Patient is followed by Dr. Lovena Le who reports his medtronic device is programmed VVIR. 9 years on the  battery.  Aortic Stenosis s/p Aortic Valve replacement Goal INR 2.5 (2.90 today). -Continued on Coumadin tonight; Pharmacy to Dose.   Chronic A-fib on Coumadin -INR was supratherapeutic, heparin discontinued 01/01/16. -On Telemetry -Coumadin to be restarted and given tonight -Repeat INR in AM  Essential Hypertension Not currently on antihypertensive therapies. Blood pressure not elevated.  Normocytic Anemia -Suspect anemia of chronic disease.  -No current indication for transfusion. -Hb/Hct was stable at 8.6/27.5 -Continue to monitor CBC  Thrombocytopenia -Platelets went from 77 -> 72 -> 75 -> 81 -Question related to blood thinners or possibly smoldering infection with Strep Bactermia.  -Follow-up HIT panel. -Repeat CBC in AM  Tinea corporis/Onychomycosis Topical antifungals ordered.  CKD Stage 3 -Review of previous GFR and Creatine shows basline is around 1.5/1.6 at baseline -Stable. BUN/Cr today was 17/1.51 -Continue to Monitor BMP  DVT prophylaxis: Anticoagulated with Warfarin Code Status: Full Family Communication: Discussed Plan of Care with [name] Daughter and Granddaughter who were present at bedside.  Disposition Plan: SNF when PASARR comes through   Consultants:   Neurology  Cardiology  Infectious Diseases  Orthopedics   -Phone Consultation with Vascular Surgery Dr. Donnetta Hutching  Procedures: CTA of Abdomen, Tagged WBC Scan; Ultrasound of Scrotum and Pelvis, TEE  Antimicrobials:   Levaquin 12/30/15--->12/31/15  Teflaro 12/31/15--->01/01/16  Eraxis 01/02/16--->01/03/16 -Rocephin 01/01/16--->  Subjective: Patient was seen and examined at bedside this AM and he was doing well. Updated the family about plan of Care. Patient somewhat resistant to going to SNF however understood he needed to go to get stronger.   Objective: Vitals:   01/05/16 0757 01/05/16 0851 01/05/16 1500 01/05/16 2121  BP: Marland Kitchen)  158/64  (!) 118/55 (!) 143/62  Pulse: 76  70  73  Resp:  20 18 19   Temp: 98.6 F (37 C)   98.4 F (36.9 C)  TempSrc: Oral   Oral  SpO2: 98%  100% 100%  Weight:      Height:        Intake/Output Summary (Last 24 hours) at 01/05/16 2142 Last data filed at 01/05/16 1500  Gross per 24 hour  Intake              100 ml  Output              100 ml  Net                0 ml   Filed Weights   12/30/15 0005  Weight: 67.3 kg (148 lb 5.9 oz)    Examination: Physical Exam:  Constitutional: Thin elderly gentleman in NAD Eyes:  Lids and conjunctivae normal, sclerae anicteric  ENMT: External Ears, Nose appear normal. Grossly normal hearing.  Neck: Appears normal, supple, no cervical masses, normal ROM, no appreciable thyromegaly Respiratory: Clear to auscultation bilaterally, no wheezing, rales, rhonchi or crackles. Normal respiratory effort and patient is not tachypenic. No accessory muscle use.  Cardiovascular: Irregularly Irregular, 3/6 Systolic Murmur appreciated. No rubs / gallops. S1 and S2 auscultated. No extremity edema. 2+ pedal pulses. No carotid bruits.  Abdomen: Soft, Mildly tender lower abdomen, non-distended. No masses palpated. No appreciable hepatosplenomegaly. Bowel sounds positive.  GU: Mild scrotal swelling with possible Left hernia. Musculoskeletal: No clubbing / cyanosis of digits/nails. No joint deformity upper and lower extremities. Warm and mildly swollen Left knee.  Skin: No rashes, lesions, ulcers. No induration; Warm and dry. Bilateral Bruising in upper arms noted. Bruising on Left foot as well noticed.  Neurologic: CN 2-12 grossly intact with no focal deficits. Romberg sign cerebellar reflexes not assessed.  Psychiatric: Normal judgment and insight. Alert and oriented x 3. Normal mood and appropriate affect.   Data Reviewed: I have personally reviewed following labs and imaging studies  CBC:  Recent Labs Lab 01/01/16 0500 01/02/16 0357 01/03/16 0440 01/04/16 0457 01/05/16 0758  WBC 5.2 4.7 5.2 4.5 5.2   NEUTROABS  --   --   --  3.4  --   HGB 9.1* 8.6* 8.6* 8.5* 8.4*  HCT 29.4* 27.6* 27.5* 27.2* 26.8*  MCV 92.5 92.9 92.9 92.8 94.4  PLT 92* 77* 72* 75* 81*   Basic Metabolic Panel:  Recent Labs Lab 01/01/16 0500 01/02/16 0357 01/03/16 0440 01/04/16 0457 01/05/16 0758  NA 137 135 137 135 135  K 3.7 3.5 4.1 3.5 3.4*  CL 105 102 103 102 102  CO2 25 25 26 26 24   GLUCOSE 145* 97 105* 91 94  BUN 12 12 14 16 17   CREATININE 1.37* 1.47* 1.49* 1.47* 1.51*  CALCIUM 8.9 8.7* 9.0 8.8* 8.6*  MG  --   --   --  1.7 1.7  PHOS  --   --   --  1.9* 1.6*   GFR: Estimated Creatinine Clearance: 30.3 mL/min (by C-G formula based on SCr of 1.51 mg/dL (H)). Liver Function Tests:  Recent Labs Lab 01/04/16 0457 01/05/16 0758  AST 18 18  ALT 12* 13*  ALKPHOS 65 71  BILITOT 1.0 0.8  PROT 5.9* 5.5*  ALBUMIN 2.7* 2.7*   No results for input(s): LIPASE, AMYLASE in the last 168 hours. No results for input(s): AMMONIA in the last 168 hours. Coagulation Profile:  Recent Labs Lab 01/01/16 0509 01/02/16 0357 01/03/16 0440 01/04/16 0457 01/05/16 0758  INR 2.32 3.31 3.69 2.90 2.85   Cardiac Enzymes: No results for input(s): CKTOTAL, CKMB, CKMBINDEX, TROPONINI in the last 168 hours. BNP (last 3 results) No results for input(s): PROBNP in the last 8760 hours. HbA1C: No results for input(s): HGBA1C in the last 72 hours. CBG:  Recent Labs Lab 12/30/15 0042 12/30/15 1224 01/01/16 2315  GLUCAP 103* 112* 107*   Lipid Profile: No results for input(s): CHOL, HDL, LDLCALC, TRIG, CHOLHDL, LDLDIRECT in the last 72 hours. Thyroid Function Tests: No results for input(s): TSH, T4TOTAL, FREET4, T3FREE, THYROIDAB in the last 72 hours. Anemia Panel: No results for input(s): VITAMINB12, FOLATE, FERRITIN, TIBC, IRON, RETICCTPCT in the last 72 hours. Sepsis Labs: No results for input(s): PROCALCITON, LATICACIDVEN in the last 168 hours.  Recent Results (from the past 240 hour(s))  Culture, blood  (routine x 2)     Status: None   Collection Time: 12/29/15 11:20 PM  Result Value Ref Range Status   Specimen Description BLOOD RIGHT ARM  Final   Special Requests BOTTLES DRAWN AEROBIC AND ANAEROBIC 5ML  Final   Culture NO GROWTH 5 DAYS  Final   Report Status 01/04/2016 FINAL  Final  Culture, blood (routine x 2)     Status: None   Collection Time: 12/29/15 11:26 PM  Result Value Ref Range Status   Specimen Description BLOOD RIGHT ARM  Final   Special Requests BOTTLES DRAWN AEROBIC AND ANAEROBIC 5ML  Final   Culture NO GROWTH 5 DAYS  Final   Report Status 01/04/2016 FINAL  Final  Culture, blood (routine x 2)     Status: None (Preliminary result)   Collection Time: 01/02/16 12:14 AM  Result Value Ref Range Status   Specimen Description BLOOD LEFT HAND  Final   Special Requests BOTTLES DRAWN AEROBIC AND ANAEROBIC 5CC  Final   Culture NO GROWTH 3 DAYS  Final   Report Status PENDING  Incomplete  Culture, blood (routine x 2)     Status: None (Preliminary result)   Collection Time: 01/02/16 12:21 AM  Result Value Ref Range Status   Specimen Description BLOOD RIGHT ANTECUBITAL  Final   Special Requests BOTTLES DRAWN AEROBIC AND ANAEROBIC 5CC  Final   Culture NO GROWTH 3 DAYS  Final   Report Status PENDING  Incomplete  Body fluid culture     Status: None (Preliminary result)   Collection Time: 01/02/16 12:27 PM  Result Value Ref Range Status   Specimen Description FLUID LEFT KNEE  Final   Special Requests NONE  Final   Gram Stain   Final    FEW WBC PRESENT, PREDOMINANTLY PMN NO ORGANISMS SEEN    Culture NO GROWTH 3 DAYS  Final   Report Status PENDING  Incomplete  Culture, Urine     Status: None   Collection Time: 01/04/16  1:40 AM  Result Value Ref Range Status   Specimen Description URINE, RANDOM  Final   Special Requests NONE  Final   Culture NO GROWTH  Final   Report Status 01/05/2016 FINAL  Final    Radiology Studies: Nm Wbc Scan Tumor  Result Date: 01/04/2016 CLINICAL  DATA:  Fever of unknown origin. EXAM: NUCLEAR MEDICINE LEUKOCYTE SCAN TECHNIQUE: Following intravenous administration of radiolabeled white blood cells, images of the head, neck, trunk, and extremities were obtained on subsequent days. RADIOPHARMACEUTICALS:  9.2 technetium Ceretec COMPARISON:  CT scan 01/03/2016 and chest x-ray 12/30/2015 FINDINGS: Diffuse  symmetric uptake in the lungs is likely due to early phase imaging. No focal uptake in the lungs to suggest pneumonia or abscess. Normal expected uptake in the liver and spleen and normal activity in the bladder. No areas of abnormal activity to suggest a source of infection. IMPRESSION: Negative whole-body leukocyte scan. Electronically Signed   By: Marijo Sanes M.D.   On: 01/04/2016 17:05   Dg Chest Port 1 View  Result Date: 01/05/2016 CLINICAL DATA:  PICC line repositioned EXAM: PORTABLE CHEST 1 VIEW COMPARISON:  Chest radiograph from earlier today. FINDINGS: Right PICC tip overlies the right atrium near the cavoatrial junction. Sternotomy wires appear aligned and intact. Stable configuration of single lead left subclavian pacemaker. Surgical clips overlie the bilateral axilla and left upper abdomen. Stable cardiomediastinal silhouette with mild cardiomegaly and aortic atherosclerosis. No pneumothorax. Stable trace right pleural effusion. Mild pulmonary edema. Improved aeration at the lung bases with mild left basilar atelectasis. IMPRESSION: 1. Right PICC terminates in the right atrium near the cavoatrial junction. 2. Stable mild congestive heart failure. 3. Trace right pleural effusion. 4. Mild left basilar atelectasis . Electronically Signed   By: Ilona Sorrel M.D.   On: 01/05/2016 12:37   Dg Chest Port 1 View  Result Date: 01/05/2016 CLINICAL DATA:  Post right upper extremity PICC line placement. EXAM: PORTABLE CHEST 1 VIEW COMPARISON:  12/30/2015 FINDINGS: Grossly unchanged enlarged cardiac silhouette and mediastinal contours post median  sternotomy and CABG. Interval placement of right upper extremity approach PICC line with tip projected over the superior aspect of the right atrium. Otherwise, stable positioning of support apparatus. The pulmonary vasculature appears less distinct on the present examination with cephalization of flow. Suspected development of a trace left-sided effusion with worsening bibasilar heterogeneous / consolidative opacities, left greater than right. No pneumothorax. No acute osseus abnormalities. Surgical clips overlie the right axilla. IMPRESSION: 1. Right upper extremity approach PICC line tip projects over the superior aspect of the right atrium. Retraction approximately 5 cm would put the tip at the superior cavoatrial junction. 2. Findings suggestive of worsening pulmonary edema with development of a small left-sided effusion and worsening bibasilar opacities, left greater than right, atelectasis versus infiltrate. Electronically Signed   By: Sandi Mariscal M.D.   On: 01/05/2016 10:37    Scheduled Meds: . cefTRIAXone (ROCEPHIN)  IV  2 g Intravenous Q24H  . docusate sodium  100 mg Oral BID  . ferrous sulfate  15 mg of iron Oral Daily  . folic acid  1 mg Oral Daily  . predniSONE  5 mg Oral Q breakfast  . rOPINIRole  2 mg Oral Daily  . simvastatin  20 mg Oral QHS  . Warfarin - Pharmacist Dosing Inpatient   Does not apply q1800   Continuous Infusions:    LOS: 7 days   Kerney Elbe, DO [facility] Pager [phone]  If 7PM-7AM, please contact night-coverage [email or web address] Password [email or web address] 01/05/2016, 9:42 PM

## 2016-01-05 NOTE — Evaluation (Signed)
Speech Language Pathology Treatment: Cognitive-Linquistic  Patient Details Name: Ethan LaniusCharles E Lawrence MRN: [id number] DOB: Dec 25, 1922 Today's Date: 01/05/2016 Time: 1205-1218 SLP Time Calculation (min) (ACUTE ONLY): 13 min  Assessment / Plan / Recommendation Clinical Impression  Skilled treatment focused on cognitive goals. Pt with increased distractions d/t discussing discharge plan with daughter prior to ST walking into room. Pt desires to go home and daughter was discussing SNF with pt. ST provides education that pt continues to require 24/7 supervision. Pt unable to recall orientation information this session and unable to retain cues given to aide with recall. Pt unable to focus on ST cues and continued to say "what did you say." Emotional support given and education provided to pt. All questions answered to pt and daughter satisfaction.    HPI HPI: Ethan HoraCharles E Blankenshipis a 82 y.o.malewith medical history significant of Prosthetic aortic valve replacement, pacemaker for complete heart block. In December of 2016 he had S.Viridans bacteremia, TEE revealed a small mobile structure (vegetation vs suture) on the ring of the bioprosthetic aortic valve. Patient treated with 6 weeks of rocephin. He apparently had another bout of recurrent bacteremia in ~March or so (6 weeks ABX), and yet a 3rd for which he just finished up his ABx for about a month ago. Despite finishing ABx his PICC line was left in. And earlier this week he developed fever 103, and wasn't feeling well. He was [facility]. Per family the "Picc line was source of infection and was removed". Patient was discharged on Augmentin and macrobid. Today he developed L sided weakness, aphasia. BGL initially low and was corrected but Aphasia persists. [facility].  Most recent head CT is negative for acute findings  showing moderate chronic microvascular changes.        SLP Plan  Continue with current plan of care     Recommendations                   Plan: Continue with current plan of care       GO           Archer Vise B. Dreama Saaverton, M.S., CCC-SLP Speech-Language Pathologist  Yasmine Kilbourne 01/05/2016, 1:21 PM

## 2016-01-05 NOTE — Progress Notes (Signed)
[facility]    Date of Admission:  12/29/2015   Total days of antibiotics 8        Day 4 ceftriaxone                 ID: Ethan Lawrence is a 82 y.o. male with hx of CAD s/p CABG, AS s/p AVR, hx of streptococcal PVE and NVE, s/p pacemaker, who was admitted with stroke like symptoms and recent streptococcal bacteremia.  Principal Problem:   Acute ischemic stroke [facility]) Active Problems:   Normocytic anemia   Chronic a-fib on Coumadin   CAD (coronary artery disease) s/p CABG   Essential hypertension   Chronic diastolic heart failure (HCC)   Long term current use of anticoagulant therapy   Pacemaker   CRBSI (catheter-related bloodstream infection), subsequent encounter   History of endocarditis   Acute CVA (cerebrovascular accident) (HCC)   Left knee pain   Thrombocytopenia (HCC)   Subjective: Patient was seen and examined this morning. Patient denies any complaints.   Medications:  . cefTRIAXone (ROCEPHIN)  IV  2 g Intravenous Q24H  . docusate sodium  100 mg Oral BID  . ferrous sulfate  15 mg of iron Oral Daily  . folic acid  1 mg Oral Daily  . predniSONE  5 mg Oral Q breakfast  . rOPINIRole  2 mg Oral Daily  . simvastatin  20 mg Oral QHS  . warfarin  5 mg Oral ONCE-1800  . Warfarin - Pharmacist Dosing Inpatient   Does not apply q1800    Objective: Vitals:   01/04/16 2100 01/05/16 0102 01/05/16 0757 01/05/16 0851  BP: 126/66 130/76 (!) 158/64   Pulse: 76 62 76   Resp:  20  20  Temp: 98.2 F (36.8 C) 98.9 F (37.2 C) 98.6 F (37 C)   TempSrc: Oral Oral Oral   SpO2: 99% 98% 98%   Weight:      Height:       General: Vital signs reviewed.  Patient is chronically ill appearing, elderly, in no acute distress and cooperative with exam.  Cardiovascular: Irregular rhythm, regular rate, 2/6 systolic ejection murmur. Pulmonary/Chest: Clear to auscultation bilaterally, no wheezes, rales, or rhonchi. Abdominal: Soft, non-tender,  non-distended, BS + Extremities: No lower extremity edema bilaterally Neurological: Follows commands, answers questions appropriately  Lab Results  Recent Labs  01/04/16 0457 01/05/16 0758  WBC 4.5 5.2  HGB 8.5* 8.4*  HCT 27.2* 26.8*  NA 135 135  K 3.5 3.4*  CL 102 102  CO2 26 24  BUN 16 17  CREATININE 1.47* 1.51*   Microbiology: 8/15 Blood Cx: Strep constellatus 8/21 Blood Cx: Strep constellatus 8/23 Blood Cx: Strep constellatus 8/24 Abdominal Fluid Cx: No growth final 8/28 Blood Cx: No growth final  10/15 Blood Cx: Strep species (microbiology lab cannot ID, currently being sent to [facility] for identification) 10/15 Urine Cx: Normal flora 10/16 PICC Cath Tip: Yeast (was not identified, requires send out lab and their microbiology lab did not have an order to ID) 10/17 Blood Cx (1/2): Strep species (being sent to [facility] for identification) 10/18 Blood Cx: No growth to date  Blood cx 10/20 >> NGTD Blood cx 10/24 >> NGTD Urine Cx >> No growth Left Knee Fluid Cx 10/24 >> No growth, no organisms seen  Studies/Results: US Pelvis Limited  Result Date: 01/03/2016 CLINICAL DATA:  82 year old male with scrotal pain and swelling x3 months. Right groin pain. EXAM: SCROTAL ULTRASOUND DOPPLER ULTRASOUND  OF THE TESTICLES TECHNIQUE: Complete ultrasound examination of the testicles, epididymis, and other scrotal structures was performed. Color and spectral Doppler ultrasound were also utilized to evaluate blood flow to the testicles. COMPARISON:  None. FINDINGS: Right testicle Measurements: 3.8 x 2.2 x 2.3 cm. There is a 4 x 2 x 4 mm intratesticular cyst. The testicle demonstrates a normal echotexture. A rounded echogenic structure in the right scrotum likely represents a scrotal pearl. Left testicle Measurements: 3.0 x 2.1 x 2.5 cm. No mass or microlithiasis visualized. Right epididymis:  2.4 x 0.7 x 2.0 cm.  No hyperemia. Left epididymis: 1.3 x 0.9 x 1.4 cm. No hyperemia.Two small  epididymal head cysts noted measuring up to 4 mm. Hyperechoic tissue superior to the testicles likely represent fat and less likely blood clot. Clinical correlation is recommended. Hydrocele: Small bilateral hydroceles containing low-level echogenic debris. Varicocele:  Left-sided varicoceles. Pulsed Doppler interrogation of both testes demonstrates normal low resistance arterial and venous waveforms bilaterally. IMPRESSION: Unremarkable testicles with Doppler detected flow bilaterally. Small bilateral hydroceles. Left-sided varicoceles. Fatty tissue versus less likely small amount of blood clot superior to the testicles. Clinical correlation is recommended. Electronically Signed   By: Elgie Collard M.D.   On: 01/03/2016 22:14   US Scrotum  Result Date: 01/03/2016 CLINICAL DATA:  82 year old male with scrotal pain and swelling x3 months. Right groin pain. EXAM: SCROTAL ULTRASOUND DOPPLER ULTRASOUND OF THE TESTICLES TECHNIQUE: Complete ultrasound examination of the testicles, epididymis, and other scrotal structures was performed. Color and spectral Doppler ultrasound were also utilized to evaluate blood flow to the testicles. COMPARISON:  None. FINDINGS: Right testicle Measurements: 3.8 x 2.2 x 2.3 cm. There is a 4 x 2 x 4 mm intratesticular cyst. The testicle demonstrates a normal echotexture. A rounded echogenic structure in the right scrotum likely represents a scrotal pearl. Left testicle Measurements: 3.0 x 2.1 x 2.5 cm. No mass or microlithiasis visualized. Right epididymis:  2.4 x 0.7 x 2.0 cm.  No hyperemia. Left epididymis: 1.3 x 0.9 x 1.4 cm. No hyperemia.Two small epididymal head cysts noted measuring up to 4 mm. Hyperechoic tissue superior to the testicles likely represent fat and less likely blood clot. Clinical correlation is recommended. Hydrocele: Small bilateral hydroceles containing low-level echogenic debris. Varicocele:  Left-sided varicoceles. Pulsed Doppler interrogation of both testes  demonstrates normal low resistance arterial and venous waveforms bilaterally. IMPRESSION: Unremarkable testicles with Doppler detected flow bilaterally. Small bilateral hydroceles. Left-sided varicoceles. Fatty tissue versus less likely small amount of blood clot superior to the testicles. Clinical correlation is recommended. Electronically Signed   By: Elgie Collard M.D.   On: 01/03/2016 22:14   Nm Wbc Scan Tumor  Result Date: 01/04/2016 CLINICAL DATA:  Fever of unknown origin. EXAM: NUCLEAR MEDICINE LEUKOCYTE SCAN TECHNIQUE: Following intravenous administration of radiolabeled white blood cells, images of the head, neck, trunk, and extremities were obtained on subsequent days. RADIOPHARMACEUTICALS:  9.2 technetium Ceretec COMPARISON:  CT scan 01/03/2016 and chest x-ray 12/30/2015 FINDINGS: Diffuse symmetric uptake in the lungs is likely due to early phase imaging. No focal uptake in the lungs to suggest pneumonia or abscess. Normal expected uptake in the liver and spleen and normal activity in the bladder. No areas of abnormal activity to suggest a source of infection. IMPRESSION: Negative whole-body leukocyte scan. Electronically Signed   By: Rudie Meyer M.D.   On: 01/04/2016 17:05   [place] Art/ven Flow Abd Pelv Doppler  Result Date: 01/03/2016 CLINICAL DATA:  82 year old male with scrotal pain and  swelling x3 months. Right groin pain. EXAM: SCROTAL ULTRASOUND DOPPLER ULTRASOUND OF THE TESTICLES TECHNIQUE: Complete ultrasound examination of the testicles, epididymis, and other scrotal structures was performed. Color and spectral Doppler ultrasound were also utilized to evaluate blood flow to the testicles. COMPARISON:  None. FINDINGS: Right testicle Measurements: 3.8 x 2.2 x 2.3 cm. There is a 4 x 2 x 4 mm intratesticular cyst. The testicle demonstrates a normal echotexture. A rounded echogenic structure in the right scrotum likely represents a scrotal pearl. Left testicle Measurements: 3.0 x 2.1 x 2.5  cm. No mass or microlithiasis visualized. Right epididymis:  2.4 x 0.7 x 2.0 cm.  No hyperemia. Left epididymis: 1.3 x 0.9 x 1.4 cm. No hyperemia.Two small epididymal head cysts noted measuring up to 4 mm. Hyperechoic tissue superior to the testicles likely represent fat and less likely blood clot. Clinical correlation is recommended. Hydrocele: Small bilateral hydroceles containing low-level echogenic debris. Varicocele:  Left-sided varicoceles. Pulsed Doppler interrogation of both testes demonstrates normal low resistance arterial and venous waveforms bilaterally. IMPRESSION: Unremarkable testicles with Doppler detected flow bilaterally. Small bilateral hydroceles. Left-sided varicoceles. Fatty tissue versus less likely small amount of blood clot superior to the testicles. Clinical correlation is recommended. Electronically Signed   By: Elgie CollardArash  Radparvar M.D.   On: 01/03/2016 22:14   Dg Chest Port 1 View  Result Date: 01/05/2016 CLINICAL DATA:  PICC line repositioned EXAM: PORTABLE CHEST 1 VIEW COMPARISON:  Chest radiograph from earlier today. FINDINGS: Right PICC tip overlies the right atrium near the cavoatrial junction. Sternotomy wires appear aligned and intact. Stable configuration of single lead left subclavian pacemaker. Surgical clips overlie the bilateral axilla and left upper abdomen. Stable cardiomediastinal silhouette with mild cardiomegaly and aortic atherosclerosis. No pneumothorax. Stable trace right pleural effusion. Mild pulmonary edema. Improved aeration at the lung bases with mild left basilar atelectasis. IMPRESSION: 1. Right PICC terminates in the right atrium near the cavoatrial junction. 2. Stable mild congestive heart failure. 3. Trace right pleural effusion. 4. Mild left basilar atelectasis . Electronically Signed   By: Delbert PhenixJason A Poff M.D.   On: 01/05/2016 12:37   Dg Chest Port 1 View  Result Date: 01/05/2016 CLINICAL DATA:  Post right upper extremity PICC line placement. EXAM:  PORTABLE CHEST 1 VIEW COMPARISON:  12/30/2015 FINDINGS: Grossly unchanged enlarged cardiac silhouette and mediastinal contours post median sternotomy and CABG. Interval placement of right upper extremity approach PICC line with tip projected over the superior aspect of the right atrium. Otherwise, stable positioning of support apparatus. The pulmonary vasculature appears less distinct on the present examination with cephalization of flow. Suspected development of a trace left-sided effusion with worsening bibasilar heterogeneous / consolidative opacities, left greater than right. No pneumothorax. No acute osseus abnormalities. Surgical clips overlie the right axilla. IMPRESSION: 1. Right upper extremity approach PICC line tip projects over the superior aspect of the right atrium. Retraction approximately 5 cm would put the tip at the superior cavoatrial junction. 2. Findings suggestive of worsening pulmonary edema with development of a small left-sided effusion and worsening bibasilar opacities, left greater than right, atelectasis versus infiltrate. Electronically Signed   By: Simonne ComeJohn  Watts M.D.   On: 01/05/2016 10:37   Ct Angio Abd/pel W/ And/or W/o  Result Date: 01/03/2016 CLINICAL DATA:  Infection EXAM: CTA ABDOMEN AND PELVIS wITHOUT AND WITH CONTRAST TECHNIQUE: Multidetector CT imaging of the abdomen and pelvis was performed using the standard protocol during bolus administration of intravenous contrast. Multiplanar reconstructed images and MIPs were obtained and  reviewed to evaluate the vascular anatomy. CONTRAST:  100 cc Isovue 370 COMPARISON:  02/25/2015 FINDINGS: VASCULAR Aorta: Aorto bi-iliac stent graft remains in place. Maximal sac diameter is 6.3 cm. Previous maximal sac diameter was 5.6 cm. Precontrast images were not included. Endoleak cannot be excluded by this study. The aortic and iliac limbs are patent. Celiac: Patent. SMA: Patent. Renals: Single right renal artery is patent. Single left renal  artery is patent. IMA: Origin is occluded.  Branches reconstitute. Inflow: Native distal right common iliac artery is patent. Right internal and external iliac arteries are patent. Native left common iliac artery is patent. There is mild disease at the origin of the left internal iliac artery. It is ectatic with a maximal diameter of 1.3 cm. There is focal 50% narrowing of the distal left external iliac artery. It is otherwise patent. Proximal Outflow: Proximal femoral arteries are grossly patent. Veins: There are no venous phase images on this study. Venous structures are non-opacified. Review of the MIP images confirms the above findings. NON-VASCULAR Lower chest: Small bilateral pleural effusions. Bibasilar ground-glass opacities are likely related to volume loss. The heart is moderately enlarged. Hepatobiliary: Calcified granulomata are seen in the liver. Postcholecystectomy. Pancreas: Unremarkable Spleen: Calcified granulomata are scattered throughout the spleen. Adrenals/Urinary Tract: Simple cysts in the right kidney. previously visualized large cyst emanating from the lower pole of the left kidney has markedly reduced in size. Kidneys are somewhat atrophic. Adrenal glands are stable in appearance. Stomach/Bowel: There is irregular wall thickening within the region of the antrum of the stomach. See image 74. Along the anterior wall, there is a polypoid filling defect. Adenocarcinoma cannot be excluded. Surgical staples are seen about the stomach. No obvious focal mass involving the colon. Lymphatic: No abnormal retroperitoneal adenopathy. Reproductive: Bladder is unremarkable.  Status post TURP. Other: There is no free fluid layering in the pelvis. Musculoskeletal: Advanced L4 compression fracture has slightly progressed since the prior study. The inferior endplate of L3 has an abnormal appearance. There has been subtle resort shin of some of the bone compared with the prior study. This may be posttraumatic.  An inflammatory process is not excluded. IMPRESSION: VASCULAR Aorto bi-iliac stent graft is patent as described above. 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 Endoleak cannot be excluded by this study. NON-VASCULAR There is a polypoid filling defect along the anterior wall of the antrum of the stomach. Adenocarcinoma is not excluded. Endoscopy is recommended. An L4 compression fracture has progressed. There has also been some resorption at the L3 inferior endplate. An inflammatory or infectious process process are not excluded. Consider MRI to further evaluate. Electronically Signed   By: Jolaine Click M.D.   On: 01/03/2016 17:05    Assessment/Plan: Mr. Macken is a 68 M with PMHx of CAD s/p CABG, AS s/p AVR, hx of streptococcal PVE and NVE, s/p pacemaker, who was admitted with stroke like symptoms and recent streptococcal bacteremia.  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 Patient has been afebrile and without leukocytosis. Patient is currently on Ceftriaxone Q12H to provide coverage for possible CNS septic emboli. TEE was negative for vegetations. CTA of abdomen showed Aorto bi-iliac stent graft patency. Knee aspiration was consistent with inflammatory changes and has shown no growth in culture. 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 Whole body tagged wbc scan was negative/normal.  Our extensive work up as inpatient has failed to reveal signs or source of infection. It is possible that patient developed endocarditis with septic emboli to his brain from prolonged strep bacteremia from a prior hospitalization that was the cause of his TIA/Stroke like symptoms. Unfortunately, he cannot have a MRI due to his pacemaker. It is also possible patient may have infection of L4; however, again we cannot perform an MRI. We  will plan to treat with a 6 week course of IV Ceftriaxone which will cover these presumed sources and follow his IV course with prolonged po antibiotics. It will be important for him to follow up with ID and to have his PICC line removed immediately after stopping IV antibiotics to prevent bacteremia.  -Continue Ceftriaxone IV for 6 weeks followed by po antibiotics as outpatient -Follow up in ID clinic, we will contact him with an appointment -Will need PICC line removed immediately after discontinuation of abx as outpatient -Blood cx 10/20 >> NGTD -Blood cx 10/24 >> NGTD -Urine Cx >> NGTD -Left Knee Fluid Cx 10/24 >> No growth, no organisms seen  Tinea corporis and Onychomycosis: Can use topical antifungals to on skin and nails to avoid systemic absorption and interaction with anticoagulation. -Consider clotrimazole 1% bid  -Consider Ciclopirox ([place]) lacquer application  Polypoid Filling Defect: Seen on CTA along the anterior wall of the antrum of the stomach. Adenocarcinoma is not excluded. Endoscopy is recommended. 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  L4 Compression Fracture: CT showed fracture has progressed with resorption at the L3 inferior endplate. An inflammatory or infectious process process are not excluded.   Karlene Lineman, DO PGY-3 Internal Medicine Resident Pager # [phone] 01/05/2016 1:02 PM

## 2016-01-05 NOTE — Progress Notes (Signed)
Physical Therapy Treatment Patient Details Name: Ethan Lawrence MRN: [id number] DOB: May 10, 1922 Today's Date: 01/05/2016    History of Present Illness 82 y.o.malewith medical history significant of Prosthetic aortic valve replacement, pacemaker for complete heart block. Pt presenting with acute onset of L sided weakness, aphasia. CT on 10/21 neg for acute infarct.    PT Comments    Improving slowly with tolerance to activity.  Today, gait was generally steady, not as effortful, but was antalgic due to L knee pain.   Follow Up Recommendations  SNF;Supervision/Assistance - 24 hour     Equipment Recommendations  Rolling walker with 5" wheels    Recommendations for Other Services       Precautions / Restrictions Precautions Precautions: Fall Restrictions Weight Bearing Restrictions: No    Mobility  Bed Mobility Overal bed mobility: Needs Assistance Bed Mobility: Supine to Sit     Supine to sit: Min assist Sit to supine: Min guard   General bed mobility comments: minor assist to get to sitting then no assist to scoot  Transfers Overall transfer level: Needs assistance Equipment used: Rolling walker (2 wheeled);2 person hand held assist Transfers: Sit to/from Stand Sit to Stand: Min assist         General transfer comment: cues for hand placement, stability assist  Ambulation/Gait Ambulation/Gait assistance: Min assist;+2 safety/equipment Ambulation Distance (Feet): 90 Feet (then an additional 80 feet after rest) Assistive device: Rolling walker (2 wheeled) Gait Pattern/deviations: Step-through pattern Gait velocity: slow Gait velocity interpretation: Below normal speed for age/gender General Gait Details: more antalgic of gait today with less knee weakness just more pain   Stairs            Wheelchair Mobility    Modified Rankin (Stroke Patients Only) Modified Rankin (Stroke Patients Only) Modified Rankin: Moderately severe  disability     Balance     Sitting balance-Leahy Scale: Good       Standing balance-Leahy Scale: Poor                      Cognition Arousal/Alertness: Awake/alert Behavior During Therapy: WFL for tasks assessed/performed Overall Cognitive Status: Within Functional Limits for tasks assessed                      Exercises      General Comments        Pertinent Vitals/Pain Pain Assessment: Faces Pain Score: 0-No pain Faces Pain Scale: Hurts little more Pain Location: L knee Pain Descriptors / Indicators: Aching;Sore Pain Intervention(s): Monitored during session;Repositioned    Home Living                      Prior Function            PT Goals (current goals can now be found in the care plan section) Acute Rehab PT Goals PT Goal Formulation: With family Time For Goal Achievement: 01/13/16 Potential to Achieve Goals: Fair Progress towards PT goals: Progressing toward goals    Frequency    Min 3X/week      PT Plan Current plan remains appropriate    Co-evaluation             End of Session   Activity Tolerance: Patient tolerated treatment well;Other (comment) Patient left: in bed;with call bell/phone within reach;with family/visitor present     Time: 9147-8295 PT Time Calculation (min) (ACUTE ONLY): 30 min  Charges:  $Gait Training: 8-22 mins $Therapeutic Activity:  8-22 mins                    G Codes:      Ethan Lawrence, Ethan Lawrence 01/05/2016, 4:57 PM 01/05/2016  Ethan Lawrence Ethan Lawrence, PT [phone]  (pager)

## 2016-01-06 LAB — COMPREHENSIVE METABOLIC PANEL
ALBUMIN: 2.8 g/dL — AB (ref 3.5–5.0)
ALK PHOS: 76 U/L (ref 38–126)
ALT: 14 U/L — AB (ref 17–63)
ANION GAP: 5 (ref 5–15)
AST: 20 U/L (ref 15–41)
BUN: 16 mg/dL (ref 6–20)
CALCIUM: 8.6 mg/dL — AB (ref 8.9–10.3)
CO2: 27 mmol/L (ref 22–32)
CREATININE: 1.36 mg/dL — AB (ref 0.61–1.24)
Chloride: 104 mmol/L (ref 101–111)
GFR calc Af Amer: 51 mL/min — ABNORMAL LOW (ref >60)
GFR calc non Af Amer: 44 mL/min — ABNORMAL LOW (ref >60)
GLUCOSE: 93 mg/dL (ref 65–99)
Potassium: 3.7 mmol/L (ref 3.5–5.1)
SODIUM: 136 mmol/L (ref 135–145)
Total Bilirubin: 0.9 mg/dL (ref 0.3–1.2)
Total Protein: 5.9 g/dL — ABNORMAL LOW (ref 6.5–8.1)

## 2016-01-06 LAB — BODY FLUID CULTURE: Culture: NO GROWTH

## 2016-01-06 LAB — CBC
HCT: 26.2 % — ABNORMAL LOW (ref 39.0–52.0)
HEMOGLOBIN: 8.2 g/dL — AB (ref 13.0–17.0)
MCH: 29.5 pg (ref 26.0–34.0)
MCHC: 31.3 g/dL (ref 30.0–36.0)
MCV: 94.2 fL (ref 78.0–100.0)
Platelets: 87 10*3/uL — ABNORMAL LOW (ref 150–400)
RBC: 2.78 MIL/uL — AB (ref 4.22–5.81)
RDW: 18.4 % — ABNORMAL HIGH (ref 11.5–15.5)
WBC: 4.7 10*3/uL (ref 4.0–10.5)

## 2016-01-06 LAB — PHOSPHORUS: Phosphorus: 1.8 mg/dL — ABNORMAL LOW (ref 2.5–4.6)

## 2016-01-06 LAB — PROTIME-INR
INR: 2.88
PROTHROMBIN TIME: 30.8 s — AB (ref 11.4–15.2)

## 2016-01-06 LAB — MAGNESIUM: Magnesium: 1.7 mg/dL (ref 1.7–2.4)

## 2016-01-06 MED ORDER — WARFARIN SODIUM 5 MG PO TABS
5.0000 mg | ORAL_TABLET | Freq: Once | ORAL | Status: AC
  Administered 2016-01-06: 5 mg via ORAL
  Filled 2016-01-06: qty 1

## 2016-01-06 NOTE — Progress Notes (Signed)
ANTICOAGULATION CONSULT NOTE - Follow Up Consult  Pharmacy Consult for Warfarin Indication: afib with bioprosthetic AVR; likely new embolic event   Allergies  Allergen Reactions  . Tape Other (See Comments)    Pulls skin off  . Vancomycin Other (See Comments)    Hearing loss    Patient Measurements: Height: 6' (182.9 cm) Weight: 148 lb 5.9 oz (67.3 kg) IBW/kg (Calculated) : 77.6  Heparin Dosing Weight: 70 kg  Vital Signs: Temp: 98.5 F (36.9 C) (10/28 0629) Temp Source: Oral (10/28 0629) BP: 126/90 (10/28 0629) Pulse Rate: 80 (10/28 0629)  Labs:  Recent Labs  01/04/16 0457 01/05/16 0758 01/06/16 0540  HGB 8.5* 8.4* 8.2*  HCT 27.2* 26.8* 26.2*  PLT 75* 81* 87*  LABPROT 30.9* 30.5* 30.8*  INR 2.90 2.85 2.88  CREATININE 1.47* 1.51* 1.36*    Estimated Creatinine Clearance: 33.7 mL/min (by C-G formula based on SCr of 1.36 mg/dL (H)).  Assessment: 82 y.o. M on warfarin PTA for afib and h/o bioprosthetic AVR with goal INR 2-3. Neuro wants new goal INR 2.5-3 as likely embolic phenomena. INR remains at goal at 2.88. CBC is stable and no bleeding noted.   PTA Warfarin Dose: 7.5mg  Wed and 5mg  all other days with last dose 10/20 (INR on admission = 2)  Goal of Therapy:  INR 2.5-3 per neuro Monitor platelets by anticoagulation protocol: Yes   Plan:  Repeat Warfarin 5mg  PO x 1 tonight Daily INR  Lysle Pearlachel Dael Howland, PharmD, BCPS Pager # [phone] 01/06/2016 10:18 AM

## 2016-01-06 NOTE — Progress Notes (Signed)
PROGRESS NOTE    Ethan Lawrence  DGU:[id number] DOB: [birth year] DOA: 12/29/2015 PCP: PROVIDER NOT IN SYSTEM   Brief Narrative:  Ethan Lawrence is an 82 y.o. male with a PMH of atrial fibrillation on Coumadin, heart block with pacemaker placement, hypertension, hyperlipidemia, coronary artery disease, prosthetic heart valve, CHF and AAA, transferred from [facility] for management of strokelike symptoms on 12/29/15. He has a history of bacteremia and heart valve vegetations in 2016 as well as recurrence in March 2017. He had an acute febrile illness earlier this week which was thought to be due to an indwelling PICC line. PICC line was removed. He had a CT scan of his head which showed no acute intracranial abnormality. MRI cannot be obtained because patient has a cardiac pacemaker. INR was 2.0 on admission. Patient currently being worked up for recurrent Strep Bacteremia which was found at [facility] and has no clear source as tagged WBC scan did not show any areas of infection. Likely it was from previously retained PICC Line. Patient is Stable for D/C to SNF however waiting on PASARR  Assessment & Plan:   Principal Problem:   Acute ischemic stroke [facility]) Active Problems:   Normocytic anemia   Chronic a-fib on Coumadin   CAD (coronary artery disease) s/p CABG   Essential hypertension   Chronic diastolic heart failure ([place])   Long term current use of anticoagulant therapy   Pacemaker   CRBSI (catheter-related bloodstream infection), subsequent encounter   History of endocarditis   Acute CVA (cerebrovascular accident) ([place])   Left knee pain   Thrombocytopenia ([place])  Likely Acute CVA -Neurology consulted on admission.  -Full stroke workup initiated.  -Continue telemetry.  -LDL at goal at 53, continue Zocor.  -Follow-up hemoglobin A1c.  -CT scan obtained which showed moderate chronic microvascular changes without evidence of  a large acute infarct. Mild global atrophy.  -2-D echo showed an EF of 40-45 percent, moderate aortic stenosis, no source of embolism.  -No MRI because of Permanent Pacer -Right Carotid Duplex prelim report showed mild disease with 1-39% right ICA plaquing. Bilateral vertebral artery flow is antegrade. Unable to scan left carotid secondary to patient's inability to keep head turned. -TEE showed no evidence of endocarditis. There was no evidence of a vegetation. -Embolic Phenomenon cannot be excluded because of [name] previous Endocarditis from previous bacteremia -PT/OT consulted and recommend SNF -Patient accepted at [facility] however waiting for PASARR number since patient is a Resident of [place]  Recurrent Strep Bacteremia CRBSI (catheter-related bloodstream infection), subsequent encounter/history of bacterial endocarditis -Was found to be Bacteremic on 10/15 and 10/17 with Strep Species at [place] was removed then which was previously retained since August. Cath Tip Had Yeast on 10/16.  -Appreciate ID recommendations.  -He has had recurrent strep bacteremia with unclear source. (had Strep Viridans in Decemeber 2016) -TEE Negative with no evidence of endocarditis or evidence of vegetation. -Will consider all etiologies given that he has a pacemaker, artificial joints including his left knee, and a history of aorto iliac stents.  -CTA Abdomen with Contrast to evaluate Aorto-iliac stents showed Aorto bi-iliac stent graft patency. 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 Endoleak cannot be excluded by this study. The non-Vascular portion showed An L4 compression fracture has progressed. There has also been some resorption at the L3 inferior endplate. An inflammatory or infectious process process are not excluded such as osteomyelits.  -  Discussed ? Endoleak with Dr. Donnetta Hutching in Vascular Surgery and stated not to worry and that it was  stable.  -MRI of spine unable to be done because patient has PPM -Tagged WBC Scan showed Negative whole-body leukocyte scan -Possible and probable source was previously retained PICC Line -Antibiotics switched from Teflaro to ceftriaxone by ID 01/01/16 ->.  -Eraxis Discontinued by ID -C/w PICC Line and continue IV Ceftriaxone daily for 6 weeks per ID's Recommendation (end Date Dec 4).   Scrotal Swelling/Inguinal Hernia -Pelvis Limited U/S and Scrotal Ultrasound showed Unremarkable testicles with Doppler detected flow bilaterally. Small bilateral hydroceles. Left-sided varicoceles. Fatty tissue versus less likely small amount of blood clot superior to the testicles.  -U/A was Negative and Urine Culture pending  Left knee pain -H/O TKR.   -Joint mildly swollen and warm to touch but no erythema .   -Plain films showed no acute osseous findings or evidence of hardware loosening, but did show a knee joint effusion with increased density in Hoffa's fat.   -ESR not elevated. Unable to get IR to perform arthrocentesis secondary to being on blood thinners.  -Dr. Rockne Menghini discussed with Orthopedic physician on call regarding need for joint aspiration.  -Dr. Alvan Dame Aspirated joint and aspirate seemed to have hemarthrosis from fall and coumadin use.  -Clotted aspirate so unable to appreciate Cell Count -Aspirate showed Red color, with Turbid Appearance, No Crystals were seen. WBC unable to be performed. Neutrophils were 92%, Lymphocytes 14%, Monocytes were 4% -Culture showed no growth  -[name] family got concerned it seemed bigger -Continue to Monitor  Dyspnea -Resolved.  -Chest x-ray showed a left lower lobe early infiltrate versus atelectasis.  -Afebrile. No evidence of pneumonia clinically.  Coronary artery disease status post CABG -Continue Statin.  -Anticoagulated with Coumadin. Pharmacy to Dose  Chronic diastolic CHF, NYHA class II -Compensated at this time and currently not in  exacerbation.  History of Complete Heart Block s/p Pacemaker -Continue Telemetry -Patient is followed by Dr. Lovena Le who reports his medtronic device is programmed VVIR. 9 years on the battery.  Aortic Stenosis s/p Aortic Valve replacement Goal INR 2.5 (2.88 today). -Continued on Coumadin tonight; Pharmacy to Dose.   Chronic A-fib on Coumadin -INR was supratherapeutic, heparin discontinued 01/01/16. -On Telemetry -Coumadin to be  given tonight (5 mg dosage) -Repeat INR in AM  Essential Hypertension Not currently on antihypertensive therapies. Blood pressure not elevated.  Normocytic Anemia -Suspect anemia of chronic disease.  -No current indication for transfusion. -Hb/Hct was stable at 8.6/27.5 -Continue to monitor CBC  Thrombocytopenia -Platelets went from 77 -> 72 -> 75 -> 81 -> 87 -Question related to blood thinners or possibly smoldering infection with Strep Bactermia.  -Follow-up HIT panel. -Repeat CBC in AM  Tinea corporis/Onychomycosis Topical antifungals ordered.  CKD Stage 3 -Review of previous GFR and Creatine shows basline is around 1.5/1.6 at baseline -Stable. BUN/Cr today was 16/1.36 -Continue to Monitor BMP  Anemia -Currently Hb/Hct have been stable -Has Hx of Anemia requiring Blood Transfusions -Continue to Monitor  DVT prophylaxis: Anticoagulated with Warfarin Code Status: Full Family Communication: Discussed Plan of Care with [name] Daughter and Granddaughter who were present at bedside.  Disposition Plan: SNF when PASARR comes through   Consultants:   Neurology  Cardiology  Infectious Diseases  Orthopedics   -Phone Consultation with Vascular Surgery Dr. Donnetta Hutching  Procedures: CTA of Abdomen, Tagged WBC Scan; Ultrasound of Scrotum and Pelvis, TEE  Antimicrobials:   Levaquin 12/30/15--->12/31/15  Teflaro 12/31/15--->01/01/16  Eraxis 01/02/16--->01/03/16 -Rocephin  01/01/16--->  Subjective: Patient was seen and examined at  bedside this AM and he did not get sleep last night but felt sleepy now. Denied any CP/SOB/N/V or lightheadedness or dizziness. No other complaints or concerns.  Objective: Vitals:   01/06/16 0240 01/06/16 0629 01/06/16 1107 01/06/16 1427  BP: (!) 142/73 126/90 (!) 102/55 122/76  Pulse: 70 80 76 81  Resp: _0 Temp: 98.6 F (37 C) 98.5 F (36.9 C) 98.3 F (36.8 C) 98.7 F (37.1 C)  TempSrc: Oral Oral Oral Oral  SpO2: 98% 98% 99% 98%  Weight:      Height:        Intake/Output Summary (Last 24 hours) at 01/06/16 1855 Last data filed at 01/06/16 8099  Gross per 24 hour  Intake                0 ml  Output              200 ml  Net             -200 ml   Filed Weights   12/30/15 0005  Weight: 67.3 kg (148 lb 5.9 oz)    Examination: Physical Exam:  Constitutional: Thin elderly gentleman in NAD sitting in chair Eyes:  Lids and conjunctivae normal, sclerae anicteric  ENMT: External Ears, Nose appear normal. Grossly normal hearing.  Neck: Appears normal, supple, no cervical masses, normal ROM, no appreciable thyromegaly Respiratory: Clear to auscultation bilaterally, no wheezing, rales, rhonchi or crackles. Normal respiratory effort and patient is not tachypenic. No accessory muscle use.  Cardiovascular: Irregularly Irregular, 3/6 Systolic Murmur appreciated. No rubs / gallops. S1 and S2 auscultated. No extremity edema. 2+ pedal pulses. No carotid bruits.  Abdomen: Soft, Mildly tender lower abdomen, non-distended. No masses palpated. No appreciable hepatosplenomegaly. Bowel sounds positive.  GU: Mild scrotal swelling with possible Left hernia. Musculoskeletal: No clubbing / cyanosis of digits/nails. No joint deformity upper and lower extremities. Warm and mildly swollen Left knee.  Skin: No rashes, lesions, ulcers. No induration; Warm and dry. Bilateral Bruising in upper arms noted. Bruising on Left foot as well noticed.  Neurologic: CN 2-12 grossly intact with no focal  deficits. Romberg sign cerebellar reflexes not assessed.  Psychiatric: Normal judgment and insight. Alert and oriented x 3. Normal mood and appropriate affect.   Data Reviewed: I have personally reviewed following labs and imaging studies  CBC:  Recent Labs Lab 01/02/16 0357 01/03/16 0440 01/04/16 0457 01/05/16 0758 01/06/16 0540  WBC 4.7 5.2 4.5 5.2 4.7  NEUTROABS  --   --  3.4  --   --   HGB 8.6* 8.6* 8.5* 8.4* 8.2*  HCT 27.6* 27.5* 27.2* 26.8* 26.2*  MCV 92.9 92.9 92.8 94.4 94.2  PLT 77* 72* 75* 81* 87*   Basic Metabolic Panel:  Recent Labs Lab 01/02/16 0357 01/03/16 0440 01/04/16 0457 01/05/16 0758 01/06/16 0540  NA 135 137 135 135 136  K 3.5 4.1 3.5 3.4* 3.7  CL 102 103 102 102 104  CO2 _1 GLUCOSE 97 105* 91 94 93  BUN _2 CREATININE 1.47* 1.49* 1.47* 1.51* 1.36*  CALCIUM 8.7* 9.0 8.8* 8.6* 8.6*  MG  --   --  1.7 1.7 1.7  PHOS  --   --  1.9* 1.6* 1.8*   GFR: Estimated Creatinine Clearance: 33.7 mL/min (by C-G formula based on SCr of 1.36 mg/dL (H)). Liver Function Tests:  Recent Labs  Lab 01/04/16 0457 01/05/16 0758 01/06/16 0540  AST _0 ALT 12* 13* 14*  ALKPHOS 65 71 76  BILITOT 1.0 0.8 0.9  PROT 5.9* 5.5* 5.9*  ALBUMIN 2.7* 2.7* 2.8*   No results for input(s): LIPASE, AMYLASE in the last 168 hours. No results for input(s): AMMONIA in the last 168 hours. Coagulation Profile:  Recent Labs Lab 01/02/16 0357 01/03/16 0440 01/04/16 0457 01/05/16 0758 01/06/16 0540  INR 3.31 3.69 2.90 2.85 2.88   Cardiac Enzymes: No results for input(s): CKTOTAL, CKMB, CKMBINDEX, TROPONINI in the last 168 hours. BNP (last 3 results) No results for input(s): PROBNP in the last 8760 hours. HbA1C: No results for input(s): HGBA1C in the last 72 hours. CBG:  Recent Labs Lab 01/01/16 2315  GLUCAP 107*   Lipid Profile: No results for input(s): CHOL, HDL, LDLCALC, TRIG, CHOLHDL, LDLDIRECT in the last 72 hours. Thyroid  Function Tests: No results for input(s): TSH, T4TOTAL, FREET4, T3FREE, THYROIDAB in the last 72 hours. Anemia Panel: No results for input(s): VITAMINB12, FOLATE, FERRITIN, TIBC, IRON, RETICCTPCT in the last 72 hours. Sepsis Labs: No results for input(s): PROCALCITON, LATICACIDVEN in the last 168 hours.  Recent Results (from the past 240 hour(s))  Culture, blood (routine x 2)     Status: None   Collection Time: 12/29/15 11:20 PM  Result Value Ref Range Status   Specimen Description BLOOD RIGHT ARM  Final   Special Requests BOTTLES DRAWN AEROBIC AND ANAEROBIC 5ML  Final   Culture NO GROWTH 5 DAYS  Final   Report Status 01/04/2016 FINAL  Final  Culture, blood (routine x 2)     Status: None   Collection Time: 12/29/15 11:26 PM  Result Value Ref Range Status   Specimen Description BLOOD RIGHT ARM  Final   Special Requests BOTTLES DRAWN AEROBIC AND ANAEROBIC 5ML  Final   Culture NO GROWTH 5 DAYS  Final   Report Status 01/04/2016 FINAL  Final  Culture, blood (routine x 2)     Status: None (Preliminary result)   Collection Time: 01/02/16 12:14 AM  Result Value Ref Range Status   Specimen Description BLOOD LEFT HAND  Final   Special Requests BOTTLES DRAWN AEROBIC AND ANAEROBIC 5CC  Final   Culture NO GROWTH 4 DAYS  Final   Report Status PENDING  Incomplete  Culture, blood (routine x 2)     Status: None (Preliminary result)   Collection Time: 01/02/16 12:21 AM  Result Value Ref Range Status   Specimen Description BLOOD RIGHT ANTECUBITAL  Final   Special Requests BOTTLES DRAWN AEROBIC AND ANAEROBIC 5CC  Final   Culture NO GROWTH 4 DAYS  Final   Report Status PENDING  Incomplete  Body fluid culture     Status: None   Collection Time: 01/02/16 12:27 PM  Result Value Ref Range Status   Specimen Description FLUID LEFT KNEE  Final   Special Requests NONE  Final   Gram Stain   Final    FEW WBC PRESENT, PREDOMINANTLY PMN NO ORGANISMS SEEN    Culture NO GROWTH 3 DAYS  Final   Report Status  01/06/2016 FINAL  Final  Culture, Urine     Status: None   Collection Time: 01/04/16  1:40 AM  Result Value Ref Range Status   Specimen Description URINE, RANDOM  Final   Special Requests NONE  Final   Culture NO GROWTH  Final   Report Status 01/05/2016 FINAL  Final    Radiology Studies: Dg Chest  Port 1 View  Result Date: 01/05/2016 CLINICAL DATA:  PICC line repositioned EXAM: PORTABLE CHEST 1 VIEW COMPARISON:  Chest radiograph from earlier today. FINDINGS: Right PICC tip overlies the right atrium near the cavoatrial junction. Sternotomy wires appear aligned and intact. Stable configuration of single lead left subclavian pacemaker. Surgical clips overlie the bilateral axilla and left upper abdomen. Stable cardiomediastinal silhouette with mild cardiomegaly and aortic atherosclerosis. No pneumothorax. Stable trace right pleural effusion. Mild pulmonary edema. Improved aeration at the lung bases with mild left basilar atelectasis. IMPRESSION: 1. Right PICC terminates in the right atrium near the cavoatrial junction. 2. Stable mild congestive heart failure. 3. Trace right pleural effusion. 4. Mild left basilar atelectasis . Electronically Signed   By: Ilona Sorrel M.D.   On: 01/05/2016 12:37   Dg Chest Port 1 View  Result Date: 01/05/2016 CLINICAL DATA:  Post right upper extremity PICC line placement. EXAM: PORTABLE CHEST 1 VIEW COMPARISON:  12/30/2015 FINDINGS: Grossly unchanged enlarged cardiac silhouette and mediastinal contours post median sternotomy and CABG. Interval placement of right upper extremity approach PICC line with tip projected over the superior aspect of the right atrium. Otherwise, stable positioning of support apparatus. The pulmonary vasculature appears less distinct on the present examination with cephalization of flow. Suspected development of a trace left-sided effusion with worsening bibasilar heterogeneous / consolidative opacities, left greater than right. No pneumothorax.  No acute osseus abnormalities. Surgical clips overlie the right axilla. IMPRESSION: 1. Right upper extremity approach PICC line tip projects over the superior aspect of the right atrium. Retraction approximately 5 cm would put the tip at the superior cavoatrial junction. 2. Findings suggestive of worsening pulmonary edema with development of a small left-sided effusion and worsening bibasilar opacities, left greater than right, atelectasis versus infiltrate. Electronically Signed   By: Sandi Mariscal M.D.   On: 01/05/2016 10:37   Scheduled Meds: . cefTRIAXone (ROCEPHIN)  IV  2 g Intravenous Q24H  . docusate sodium  100 mg Oral BID  . ferrous sulfate  15 mg of iron Oral Daily  . folic acid  1 mg Oral Daily  . predniSONE  5 mg Oral Q breakfast  . rOPINIRole  2 mg Oral Daily  . simvastatin  20 mg Oral QHS  . Warfarin - Pharmacist Dosing Inpatient   Does not apply q1800   Continuous Infusions:    LOS: 8 days   Kerney Elbe, DO [facility] Pager [phone]  If 7PM-7AM, please contact night-coverage [email or web address] Password [facility] 01/06/2016, 6:55 PM

## 2016-01-07 LAB — CBC
HCT: 29.1 % — ABNORMAL LOW (ref 39.0–52.0)
Hemoglobin: 8.9 g/dL — ABNORMAL LOW (ref 13.0–17.0)
MCH: 29.6 pg (ref 26.0–34.0)
MCHC: 30.6 g/dL (ref 30.0–36.0)
MCV: 96.7 fL (ref 78.0–100.0)
PLATELETS: 109 10*3/uL — AB (ref 150–400)
RBC: 3.01 MIL/uL — AB (ref 4.22–5.81)
RDW: 18.8 % — ABNORMAL HIGH (ref 11.5–15.5)
WBC: 4.8 10*3/uL (ref 4.0–10.5)

## 2016-01-07 LAB — CULTURE, BLOOD (ROUTINE X 2)
Culture: NO GROWTH
Culture: NO GROWTH

## 2016-01-07 LAB — PROTIME-INR
INR: 3.04
PROTHROMBIN TIME: 32.1 s — AB (ref 11.4–15.2)

## 2016-01-07 MED ORDER — WARFARIN SODIUM 4 MG PO TABS
4.0000 mg | ORAL_TABLET | Freq: Once | ORAL | Status: AC
  Administered 2016-01-07: 4 mg via ORAL
  Filled 2016-01-07: qty 1

## 2016-01-07 NOTE — Progress Notes (Signed)
ANTICOAGULATION CONSULT NOTE - Follow Up Consult  Pharmacy Consult for Warfarin Indication: afib with bioprosthetic AVR; likely new embolic event   Allergies  Allergen Reactions  . Tape Other (See Comments)    Pulls skin off  . Vancomycin Other (See Comments)    Hearing loss    Patient Measurements: Height: 6' (182.9 cm) Weight: 148 lb 5.9 oz (67.3 kg) IBW/kg (Calculated) : 77.6  Heparin Dosing Weight: 70 kg  Vital Signs: Temp: 98.3 F (36.8 C) (10/29 0500) Temp Source: Oral (10/29 0500) BP: 122/69 (10/29 0500) Pulse Rate: 84 (10/29 0500)  Labs:  Recent Labs  01/05/16 0758 01/06/16 0540 01/07/16 0430  HGB 8.4* 8.2* 8.9*  HCT 26.8* 26.2* 29.1*  PLT 81* 87* 109*  LABPROT 30.5* 30.8* 32.1*  INR 2.85 2.88 3.04  CREATININE 1.51* 1.36*  --     Estimated Creatinine Clearance: 33.7 mL/min (by C-G formula based on SCr of 1.36 mg/dL (H)).  Assessment: 82 y.o. M on warfarin PTA for afib and h/o bioprosthetic AVR with goal INR 2-3. Neuro wants new goal INR 2.5-3 as likely embolic phenomena. INR slightly above goal at 3.04. CBC is stable and no bleeding noted.   PTA Warfarin Dose: 7.5mg  Wed and 5mg  all other days with last dose 10/20 (INR on admission = 2)  Goal of Therapy:  INR 2.5-3 per neuro Monitor platelets by anticoagulation protocol: Yes   Plan:  Repeat Warfarin 4 mg PO x 1 tonight Daily INR Monitor signs/symptoms of bleeding/ CBC  Bailey MechEmily Stewart, PharmD PGY1 Pharmacy Resident Pager # [phone]  01/07/2016 10:39 AM

## 2016-01-07 NOTE — Progress Notes (Signed)
PROGRESS NOTE    Ethan Lawrence  CVE:[id number] DOB: 03-16-1922 DOA: 12/29/2015 PCP: PROVIDER NOT IN SYSTEM  Brief Narrative:  Ethan Lawrence is an 82 y.o. male with a PMH of atrial fibrillation on Coumadin, heart block with pacemaker placement, hypertension, hyperlipidemia, coronary artery disease, prosthetic heart valve, CHF and AAA, transferred from [facility] for management of strokelike symptoms on 12/29/15. He has a history of bacteremia and heart valve vegetations in 2016 as well as recurrence in March 2017. He had an acute febrile illness earlier this week which was thought to be due to an indwelling PICC line. PICC line was removed. He had a CT scan of his head which showed no acute intracranial abnormality. MRI cannot be obtained because patient has a cardiac pacemaker. INR was 2.0 on admission. Patient currently being worked up for recurrent Strep Bacteremia which was found at [facility] and has no clear source as tagged WBC scan did not show any areas of infection. Likely it was from previously retained PICC Line. Patient is Stable for D/C to SNF however waiting on PASARR and Ship broker.  Assessment & Plan:   Principal Problem:   Acute ischemic stroke (HCC) Active Problems:   Normocytic anemia   Chronic a-fib on Coumadin   CAD (coronary artery disease) s/p CABG   Essential hypertension   Chronic diastolic heart failure ([place])   Long term current use of anticoagulant therapy   Pacemaker   CRBSI (catheter-related bloodstream infection), subsequent encounter   History of endocarditis   Acute CVA (cerebrovascular accident) ([place])   Left knee pain   Thrombocytopenia ([place])  Likely Acute CVA -Neurology consulted on admission.  -Full stroke workup initiated.  -Continue telemetry.  -LDL at goal at 53, continue Zocor.  -Follow-up hemoglobin A1c.  -CT scan obtained which showed moderate chronic microvascular  changes without evidence of a large acute infarct. Mild global atrophy.  -2-D echo showed an EF of 40-45 percent, moderate aortic stenosis, no source of embolism.  -No MRI because of Permanent Pacer -Right Carotid Duplex prelim report showed mild disease with 1-39% right ICA plaquing. Bilateral vertebral artery flow is antegrade. Unable to scan left carotid secondary to patient's inability to keep head turned. -TEE showed no evidence of endocarditis. There was no evidence of a vegetation. -Embolic Phenomenon cannot be excluded because of [name] previous Endocarditis from previous bacteremia -PT/OT consulted and recommend SNF -Patient accepted at [facility] number obtained and now awaiting Insurance Auth -Discussed with Social Worker Denyse Amass and she states it will be tomorrow and patient will be D/C'd to [facility] SNF in AM  Recurrent Strep Bacteremia CRBSI (catheter-related bloodstream infection), subsequent encounter/history of bacterial endocarditis -Was found to be Bacteremic on 10/15 and 10/17 with Strep Species at [place] was removed then which was previously retained since August. Cath Tip Had Yeast on 10/16.  -Appreciate ID recommendations.  -He has had recurrent strep bacteremia with unclear source. (had Strep Viridans in Decemeber 2016) -TEE Negative with no evidence of endocarditis or evidence of vegetation. -Will consider all etiologies given that he has a pacemaker, artificial joints including his left knee, and a history of aorto iliac stents.  -CTA Abdomen with Contrast to evaluate Aorto-iliac stents showed Aorto bi-iliac stent graft patency. 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 Endoleak cannot be excluded by this study. The non-Vascular portion showed An L4 compression fracture has progressed. There has  also been some resorption at the L3 inferior endplate. An inflammatory or infectious process process  are not excluded such as osteomyelits.  -Discussed ? Endoleak with Dr. Donnetta Hutching in Vascular Surgery and stated not to worry and that it was stable.  -MRI of spine unable to be done because patient has PPM -Tagged WBC Scan showed Negative whole-body leukocyte scan -Possible and probable source was previously retained PICC Line -Antibiotics switched from Teflaro to ceftriaxone by ID 01/01/16 ->.  -Eraxis Discontinued by ID -C/w PICC Line and continue IV Ceftriaxone daily for 6 weeks per ID's Recommendation (end Date Dec 4).  -Outpatient Infectious Diseases follow ups in Mid November and Beginning December  Scrotal Swelling/Inguinal Hernia -Pelvis Limited U/S and Scrotal Ultrasound showed Unremarkable testicles with Doppler detected flow bilaterally. Small bilateral hydroceles. Left-sided varicoceles. Fatty tissue versus less likely small amount of blood clot superior to the testicles.  -U/A was Negative and Urine Culture showed no growth  Left knee pain -H/O TKR.   -Joint mildly swollen and warm to touch but no erythema .   -Plain films showed no acute osseous findings or evidence of hardware loosening, but did show a knee joint effusion with increased density in Hoffa's fat.   -ESR not elevated. Unable to get IR to perform arthrocentesis secondary to being on blood thinners.  -Dr. Rockne Menghini discussed with Orthopedic physician on call regarding need for joint aspiration.  -Dr. Alvan Dame Aspirated joint and aspirate seemed to have hemarthrosis from fall and coumadin use.  -Clotted aspirate so unable to appreciate Cell Count -Aspirate showed Red color, with Turbid Appearance, No Crystals were seen. WBC unable to be performed. Neutrophils were 92%, Lymphocytes 14%, Monocytes were 4% -Culture showed no growth  -[name] family got concerned it seemed bigger -Continue to Monitor  Dyspnea -Resolved.  -Chest x-ray showed a left lower lobe early infiltrate versus atelectasis.  -Afebrile. No evidence of  pneumonia clinically.  Coronary artery disease status post CABG -Continue Statin.  -Anticoagulated with Coumadin. Pharmacy to Dose  Chronic diastolic CHF, NYHA class II -Compensated at this time and currently not in exacerbation.  History of Complete Heart Block s/p Pacemaker -Continue Telemetry -Patient is followed by Dr. Lovena Le who reports his medtronic device is programmed VVIR. 9 years on the battery.  Aortic Stenosis s/p Aortic Valve replacement Goal INR 2.5-3.0 (3.04 today). -Continued on Coumadin tonight; Pharmacy to Dose. -Will receive 4 mg po tonight   Chronic A-fib on Coumadin -INR 3.04  -On Telemetry -Coumadin to be  given tonight (83m dosage) -Repeat INR in AM  Essential Hypertension Not currently on antihypertensive therapies. Blood pressure not elevated.  Normocytic Anemia -Suspect anemia of chronic disease.  -No current indication for transfusion. -Hb/Hct was stable at 8.9/29.1 (8.6/27.5) -Continue to monitor CBC per Pharmacy -Has Hx of Anemia requiring Blood Transfusions -Continue to Monitor for S/Sx of Bleeding  Thrombocytopenia -Platelets went from 77 -> 72 -> 75 -> 81 -> 87 -> 109 -Question related to blood thinners or possibly smoldering infection with Strep Bactermia.  -Follow-up HIT panel. -Repeat CBC in AM per Pharmacy  Tinea corporis/Onychomycosis Topical antifungals ordered.  CKD Stage 3 -Review of previous GFR and Creatine shows basline is around 1.5/1.6 at baseline -Stable. BUN/Cr yesterday was 16/1.36 -[facility]: Anticoagulated with Warfarin Code Status: Full Family Communication: Discussed Plan of Care with [name] Daughter and Granddaughter who were present at bedside.  Disposition Plan: SNF when PASARR comes through   Consultants:   Neurology  Cardiology  Infectious Diseases  Orthopedics   -Phone Consultation with Vascular Surgery Dr. Donnetta Hutching  Procedures: CTA of Abdomen,  Tagged WBC Scan; Ultrasound of Scrotum and Pelvis, TEE  Antimicrobials:   Levaquin 12/30/15--->12/31/15  Teflaro 12/31/15--->01/01/16  Eraxis 01/02/16--->01/03/16 -Rocephin 01/01/16--->  Subjective: Patient was seen and examined at bedside this AM and he stated he was a little tired. No CP/SOB/N/V or lightheadedness. Stated he didn't sleep very well and was worried if he would have a TV at [facility] or not.   Objective: Vitals:   01/07/16 0100 01/07/16 0500 01/07/16 1023 01/07/16 1428  BP: 118/71 122/69 120/70 123/77  Pulse: 80 84 78 76  Resp: 17 17 18 18   Temp: 98.5 F (36.9 C) 98.3 F (36.8 C) 98.1 F (36.7 C) 98.7 F (37.1 C)  TempSrc: Oral Oral Oral Oral  SpO2: 97% 98% 98% 98%  Weight:      Height:        Intake/Output Summary (Last 24 hours) at 01/07/16 1515 Last data filed at 01/07/16 0300  Gross per 24 hour  Intake                0 ml  Output                1 ml  Net               -1 ml   Filed Weights   12/30/15 0005  Weight: 67.3 kg (148 lb 5.9 oz)    Examination: Physical Exam:  Constitutional: Thin elderly gentleman in NAD sitting in chair Eyes:  Lids and conjunctivae normal, sclerae anicteric  ENMT: External Ears, Nose appear normal. Grossly normal hearing.  Neck: Appears normal, supple, no cervical masses, normal ROM, no appreciable thyromegaly Respiratory: Clear to auscultation bilaterally, no wheezing, rales, rhonchi or crackles. Normal respiratory effort and patient is not tachypenic. No accessory muscle use.  Cardiovascular: Irregularly Irregular, 3/6 Systolic Murmur appreciated. No rubs / gallops. S1 and S2 auscultated. No extremity edema. 2+ pedal pulses. No carotid bruits.  Abdomen: Soft, Mildly tender lower abdomen, non-distended. No masses palpated. No appreciable hepatosplenomegaly. Bowel sounds positive.  GU: Mild scrotal swelling with possible Left hernia. Musculoskeletal: No clubbing / cyanosis of digits/nails. No joint deformity upper  and lower extremities. Warm and mildly swollen Left knee. Skin: No rashes, lesions, ulcers. No induration; Warm and dry.  Extremities: Bilateral Bruising in upper arms noted. Bruising on Left foot as well noticed. PICC in Right Arm in place. Neurologic: CN 2-12 grossly intact with no focal deficits. Romberg sign cerebellar reflexes not assessed.  Psychiatric: Normal judgment and insight. Alert and oriented x 3. Normal mood and appropriate affect.   Data Reviewed: I have personally reviewed following labs and imaging studies  CBC:  Recent Labs Lab 01/03/16 0440 01/04/16 0457 01/05/16 0758 01/06/16 0540 01/07/16 0430  WBC 5.2 4.5 5.2 4.7 4.8  NEUTROABS  --  3.4  --   --   --   HGB 8.6* 8.5* 8.4* 8.2* 8.9*  HCT 27.5* 27.2* 26.8* 26.2* 29.1*  MCV 92.9 92.8 94.4 94.2 96.7  PLT 72* 75* 81* 87* 937*   Basic Metabolic Panel:  Recent Labs Lab 01/02/16 0357 01/03/16 0440 01/04/16 0457 01/05/16 0758 01/06/16 0540  NA 135 137 135 135 136  K 3.5 4.1 3.5 3.4* 3.7  CL 102 103 102 102 104  CO2 25 26 26 24 27   GLUCOSE 97 105* 91 94 93  BUN 12 14 16 17 16   CREATININE  1.47* 1.49* 1.47* 1.51* 1.36*  CALCIUM 8.7* 9.0 8.8* 8.6* 8.6*  MG  --   --  1.7 1.7 1.7  PHOS  --   --  1.9* 1.6* 1.8*   GFR: Estimated Creatinine Clearance: 33.7 mL/min (by C-G formula based on SCr of 1.36 mg/dL (H)). Liver Function Tests:  Recent Labs Lab 01/04/16 0457 01/05/16 0758 01/06/16 0540  AST 18 18 20   ALT 12* 13* 14*  ALKPHOS 65 71 76  BILITOT 1.0 0.8 0.9  PROT 5.9* 5.5* 5.9*  ALBUMIN 2.7* 2.7* 2.8*   No results for input(s): LIPASE, AMYLASE in the last 168 hours. No results for input(s): AMMONIA in the last 168 hours. Coagulation Profile:  Recent Labs Lab 01/03/16 0440 01/04/16 0457 01/05/16 0758 01/06/16 0540 01/07/16 0430  INR 3.69 2.90 2.85 2.88 3.04   Cardiac Enzymes: No results for input(s): CKTOTAL, CKMB, CKMBINDEX, TROPONINI in the last 168 hours. BNP (last 3 results) No  results for input(s): PROBNP in the last 8760 hours. HbA1C: No results for input(s): HGBA1C in the last 72 hours. CBG:  Recent Labs Lab 01/01/16 2315  GLUCAP 107*   Lipid Profile: No results for input(s): CHOL, HDL, LDLCALC, TRIG, CHOLHDL, LDLDIRECT in the last 72 hours. Thyroid Function Tests: No results for input(s): TSH, T4TOTAL, FREET4, T3FREE, THYROIDAB in the last 72 hours. Anemia Panel: No results for input(s): VITAMINB12, FOLATE, FERRITIN, TIBC, IRON, RETICCTPCT in the last 72 hours. Sepsis Labs: No results for input(s): PROCALCITON, LATICACIDVEN in the last 168 hours.  Recent Results (from the past 240 hour(s))  Culture, blood (routine x 2)     Status: None   Collection Time: 12/29/15 11:20 PM  Result Value Ref Range Status   Specimen Description BLOOD RIGHT ARM  Final   Special Requests BOTTLES DRAWN AEROBIC AND ANAEROBIC 5ML  Final   Culture NO GROWTH 5 DAYS  Final   Report Status 01/04/2016 FINAL  Final  Culture, blood (routine x 2)     Status: None   Collection Time: 12/29/15 11:26 PM  Result Value Ref Range Status   Specimen Description BLOOD RIGHT ARM  Final   Special Requests BOTTLES DRAWN AEROBIC AND ANAEROBIC 5ML  Final   Culture NO GROWTH 5 DAYS  Final   Report Status 01/04/2016 FINAL  Final  Culture, blood (routine x 2)     Status: None   Collection Time: 01/02/16 12:14 AM  Result Value Ref Range Status   Specimen Description BLOOD LEFT HAND  Final   Special Requests BOTTLES DRAWN AEROBIC AND ANAEROBIC 5CC  Final   Culture NO GROWTH 5 DAYS  Final   Report Status 01/07/2016 FINAL  Final  Culture, blood (routine x 2)     Status: None   Collection Time: 01/02/16 12:21 AM  Result Value Ref Range Status   Specimen Description BLOOD RIGHT ANTECUBITAL  Final   Special Requests BOTTLES DRAWN AEROBIC AND ANAEROBIC 5CC  Final   Culture NO GROWTH 5 DAYS  Final   Report Status 01/07/2016 FINAL  Final  Body fluid culture     Status: None   Collection Time:  01/02/16 12:27 PM  Result Value Ref Range Status   Specimen Description FLUID LEFT KNEE  Final   Special Requests NONE  Final   Gram Stain   Final    FEW WBC PRESENT, PREDOMINANTLY PMN NO ORGANISMS SEEN    Culture NO GROWTH 3 DAYS  Final   Report Status 01/06/2016 FINAL  Final  Culture, Urine  Status: None   Collection Time: 01/04/16  1:40 AM  Result Value Ref Range Status   Specimen Description URINE, RANDOM  Final   Special Requests NONE  Final   Culture NO GROWTH  Final   Report Status 01/05/2016 FINAL  Final    Radiology Studies: No results found. Scheduled Meds: . cefTRIAXone (ROCEPHIN)  IV  2 g Intravenous Q24H  . docusate sodium  100 mg Oral BID  . ferrous sulfate  15 mg of iron Oral Daily  . folic acid  1 mg Oral Daily  . predniSONE  5 mg Oral Q breakfast  . rOPINIRole  2 mg Oral Daily  . simvastatin  20 mg Oral QHS  . warfarin  4 mg Oral ONCE-1800  . Warfarin - Pharmacist Dosing Inpatient   Does not apply q1800   Continuous Infusions:    LOS: 9 days   Kerney Elbe, DO [facility] Pager [phone]  If 7PM-7AM, please contact night-coverage [email or web address] Password [email or web address] 01/07/2016, 3:15 PM

## 2016-01-08 LAB — CBC
HEMATOCRIT: 26.6 % — AB (ref 39.0–52.0)
HEMOGLOBIN: 8.1 g/dL — AB (ref 13.0–17.0)
MCH: 28.9 pg (ref 26.0–34.0)
MCHC: 30.5 g/dL (ref 30.0–36.0)
MCV: 95 fL (ref 78.0–100.0)
Platelets: 106 10*3/uL — ABNORMAL LOW (ref 150–400)
RBC: 2.8 MIL/uL — ABNORMAL LOW (ref 4.22–5.81)
RDW: 18.5 % — ABNORMAL HIGH (ref 11.5–15.5)
WBC: 4.6 10*3/uL (ref 4.0–10.5)

## 2016-01-08 LAB — PROTIME-INR
INR: 3.39
Prothrombin Time: 35.1 seconds — ABNORMAL HIGH (ref 11.4–15.2)

## 2016-01-08 MED ORDER — FERROUS SULFATE 75 (15 FE) MG/ML PO SOLN: 15.0000 mg | Freq: Every day | ORAL | 0 refills | Status: AC

## 2016-01-08 MED ORDER — DOCUSATE SODIUM 100 MG PO CAPS: 100.0000 mg | ORAL_CAPSULE | Freq: Two times a day (BID) | ORAL | 0 refills | Status: AC

## 2016-01-08 MED ORDER — HYDROCODONE-ACETAMINOPHEN 10-325 MG PO TABS: 1.0000 | ORAL_TABLET | Freq: Four times a day (QID) | ORAL | 0 refills | Status: AC | PRN

## 2016-01-08 MED ORDER — ROPINIROLE HCL 2 MG PO TABS: 2.0000 mg | ORAL_TABLET | Freq: Every day | ORAL | 0 refills | Status: AC

## 2016-01-08 MED ORDER — ACETAMINOPHEN 325 MG PO TABS: 650.0000 mg | ORAL_TABLET | Freq: Four times a day (QID) | ORAL | 0 refills | Status: AC | PRN

## 2016-01-08 MED ORDER — DIPHENHYDRAMINE HCL 25 MG PO CAPS: 25.0000 mg | ORAL_CAPSULE | Freq: Every evening | ORAL | 0 refills | Status: AC | PRN

## 2016-01-08 MED ORDER — DEXTROSE 5 % IV SOLN: 2.0000 g | INTRAVENOUS | 0 refills | Status: AC

## 2016-01-08 NOTE — Progress Notes (Signed)
ANTICOAGULATION CONSULT NOTE - Follow Up Consult  Pharmacy Consult for Warfarin Indication: afib with bioprosthetic AVR; likely new embolic event   Allergies  Allergen Reactions  . Tape Other (See Comments)    Pulls skin off  . Vancomycin Other (See Comments)    Hearing loss    Patient Measurements: Height: 6' (182.9 cm) Weight: 148 lb 5.9 oz (67.3 kg) IBW/kg (Calculated) : 77.6  Heparin Dosing Weight: 70 kg  Vital Signs:    Labs:  Recent Labs  01/06/16 0540 01/07/16 0430 01/08/16 0325  HGB 8.2* 8.9* 8.1*  HCT 26.2* 29.1* 26.6*  PLT 87* 109* 106*  LABPROT 30.8* 32.1* 35.1*  INR 2.88 3.04 3.39  CREATININE 1.36*  --   --     Estimated Creatinine Clearance: 33.7 mL/min (by C-G formula based on SCr of 1.36 mg/dL (H)).  Assessment: 82 y.o. M on Coumadin 5mg  daily exc for 7.5mg  on Wed PTA for hx afib, hx bio AVR. INR goal of 2.5-3.0. INR on admit was slightly low at 1.95. Now INR trending up to 3.39 today. Hgb stable at 8.1, plts 106.  Goal of Therapy:  INR 2.5-3 per neuro Monitor platelets by anticoagulation protocol: Yes   Plan:  Hold Coumadin tonight Monitor daily INR, CBC, s/s of bleed  Ethan BiNathan Lorisa Lawrence, PharmD, BCPS Clinical Pharmacist Pager [phone] 01/08/2016 10:18 AM

## 2016-01-08 NOTE — Care Management Note (Signed)
Case Management Note  Patient Details  Name: Ethan Lawrence MRN: [id number] Date of Birth: [birth year]  Subjective/Objective:                    Action/Plan: [facility]. No further needs per CM.   Expected Discharge Date:  01/01/16               Expected Discharge Plan:  Skilled Nursing Facility  In-House Referral:     Discharge planning Services     Post Acute Care Choice:    Choice offered to:     DME Arranged:    DME Agency:     HH Arranged:    HH Agency:     Status of Service:  Completed, signed off  If discussed at [employer] Meetings, dates discussed:    Additional Comments:  Kermit BaloKelli F Fiorela Pelzer, RN 01/08/2016, 12:55 PM

## 2016-01-08 NOTE — Clinical Social Work Placement (Signed)
   CLINICAL SOCIAL WORK PLACEMENT  NOTE  Date:  01/08/2016  Patient Details  Name: Ethan Lawrence MRN: [id number] Date of Birth: 1923/02/20  Clinical Social Work is seeking post-discharge placement for this patient at the Skilled  Nursing Facility level of care (*CSW will initial, date and re-position this form in  chart as items are completed):      Patient/family provided with [facility] Clinical Social Work Department's list of facilities offering this level of care within the geographic area requested by the patient (or if unable, by the patient's family).      Patient/family informed of their freedom to choose among providers that offer the needed level of care, that participate in Medicare, Medicaid or managed care program needed by the patient, have an available bed and are willing to accept the patient.      Patient/family informed of [facility]'s ownership interest in [facility] and [facility], as well as of the fact that they are under no obligation to receive care at these facilities.  PASRR submitted to EDS on 01/04/16     PASRR number received on 01/05/16     Existing PASRR number confirmed on       FL2 transmitted to all facilities in geographic area requested by pt/family on 01/04/16     FL2 transmitted to all facilities within larger geographic area on       Patient informed that his/her managed care company has contracts with or will negotiate with certain facilities, including the following:        Yes   Patient/family informed of bed offers received.  [facility]     Physician recommends and patient chooses bed at      [facility] 01[facility]08[facility]16.  Patient to be transferred to facility by Pt's family     Patient family notified on 01/08/16 of transfer.  Name of family member notified:  Pt's daughter     PHYSICIAN Please sign FL2     Additional Comment:     _______________________________________________ Ethan QuerySarah Emanual Lamountain, LCSW 01/08/2016, 2:10 PM

## 2016-01-08 NOTE — Care Management Important Message (Signed)
Important Message  Patient Details  Name: Ethan LaniusCharles E Bohne MRN: [id number] Date of Birth: 1922/06/18   Medicare Important Message Given:  Yes    Dorena BodoIris Daryan Buell 01/08/2016, 5:28 PM

## 2016-01-08 NOTE — Clinical Social Work Note (Signed)
Pt is ready for discharge today and [facility]. [employer] has been obtained. Pt and family are aware and agreeable to discharge plan. RN to call report. Pt's family will provide transportation. CSW is signing off as no further needs identified.   Dede QuerySarah Makylee Sanborn, MSW, LCSW  Clinical Social Worker  [phone]

## 2016-01-08 NOTE — Progress Notes (Signed)
Patient pain 9/10 chronic low back and left knee pain relieved by heat therapy tremendously (5/10). RN will continue to monitor.

## 2016-01-08 NOTE — Discharge Summary (Addendum)
Physician Discharge Summary  Ethan Lawrence GUY:[id number] DOB: 21-Jan-1923 DOA: 12/29/2015  PCP: PROVIDER NOT IN SYSTEM  Admit date: 12/29/2015 Discharge date: 01/08/2016  Admitted From: Home Disposition:  SNF  Recommendations for Outpatient Follow-up:  1. Follow up with PCP in 1-2 weeks 2. Follow up with Infectious Diseases Mid November and Early December 3. Hold Tonight's Coumadin Dose and Repeat INR in AM 4. Follow up with Orthopedics as an outpatient 5. Follow up with Vascular Surgery as an outpatient as patient missed last appointment 6. Follow up with Cardiology as an outpatient 7. Please obtain BMP/CBC within 1 week  Home Health: No Equipment/Devices: None  Discharge Condition: Stable and Improved CODE STATUS: FULL Diet recommendation: Heart Healthy    Brief/Interim Summary: Ethan Soderlund Blankenshipis an 82 y.o.malewith a PMH of atrial fibrillation on Coumadin, heart block with pacemaker placement, hypertension, hyperlipidemia, coronary artery disease, prosthetic heart valve, CHF and AAA, transferred from [facility] for management of strokelike symptoms on 12/29/15. He has a history of bacteremia and heart valve vegetations in 2016 as well as recurrence in March 2017. He had an acute febrile illness earlier this week which was thought to be due to an indwelling PICC line. PICC line was removed. He had a CT scan of his head which showed no acute intracranial abnormality. MRI cannot be obtained because patient has a cardiac pacemaker. INR was 2.0 on admission. Patient was  thouroughly worked up for recurrent Strep Bacteremia which was found at [facility] and has no clear source as tagged WBC scan did not show any areas of infection. Likely it was from previously retained PICC Line however Osteomyelitis of the Spine could not be ruled out as an MRI could not be done. Plan is to continue IV Abx for 6 weeks and he will follow up  with Infectious Diseases as an outpatient along with his PCP. He was tolerating Rehabilitation well and Patient is Stable for D/C to SNF at this time.   Discharge Diagnoses:  Principal Problem:   Acute ischemic stroke [facility]) Active Problems:   Normocytic anemia   Chronic a-fib on Coumadin   CAD (coronary artery disease) s/p CABG   Essential hypertension   Chronic diastolic heart failure ([place])   Long term current use of anticoagulant therapy   Pacemaker   CRBSI (catheter-related bloodstream infection), subsequent encounter   History of endocarditis   Acute CVA (cerebrovascular accident) ([place])   Left knee pain   Thrombocytopenia ([place])  Likely Acute CVA/TIA -Neurology consulted on admission.  -Full stroke workup initiated.  -Continued on Telemetry.  -LDL at goal at 53, continue Zocor.  -Follow-up hemoglobin A1c.  -CT scan obtained which showed moderate chronic microvascular changes without evidence of a large acute infarct. Mild global atrophy.  -2-D echo showed an EF of 40-45 percent, moderate aortic stenosis, no source of embolism.  -No MRI because of Permanent Pacer -Right Carotid Duplex prelim report showed mild disease with 1-39% right ICA plaquing. Bilateral vertebral artery flow is antegrade. Unable to scan left carotid secondary to patient's inability to keep head turned. -TEE showed no evidence of endocarditis. There was no evidence of a vegetation. -Embolic Phenomenon cannot be excluded because of [name] previous Endocarditis from previous bacteremia -PT/OT consulted and recommend SNF -Stable at this point    Recurrent Strep Bacteremia CRBSI (catheter-related bloodstream infection), subsequent encounter/history of bacterial endocarditis -Was found to be Bacteremic on 10/15 and 10/17 with Strep Species at [place] was  removed then which was previously retained since August. Cath Tip Had Yeast on 10/16.  -Appreciated ID recommendations.  -He  has had recurrent strep bacteremia with unclear source. (had Strep Viridans in Decemeber 2016) -TEE Negative with no evidence of endocarditis or evidence of vegetation. -Considered all etiologies given that he has a pacemaker, artificial joints including his left knee, and a history of aorto iliac stents.  -CTA Abdomen with Contrast to evaluate Aorto-iliac stents showed Aorto bi-iliac stent graft patency. 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 Endoleak cannot be excluded by this study. The non-Vascular portion showed An L4 compression fracture has progressed. There has also been some resorption at the L3 inferior endplate. An inflammatory or infectious process process are not excluded such as osteomyelits.  -Discussed ? Endoleak with Dr. Donnetta Hutching in Vascular Surgery and stated not to worry and that it was stable.  -MRI of spine unable to be done because patient has PPM -Tagged WBC Scan showed Negative whole-body leukocyte scan -Possible and probable source was previously retained PICC Line -Antibiotics switched from Teflaroto Ceftriaxone by ID 01/01/16 ->.  -Eraxis Discontinued by ID -C/w PICC Line and continue IV Ceftriaxone daily for 6 weeks per ID's Recommendation (end Date Dec 4).  -Outpatient Infectious Diseases follow ups in Mid November and Beginning December  Scrotal Swelling/Inguinal Hernia -Pelvis Limited U/S and Scrotal Ultrasound showed Unremarkable testicles with Doppler detected flow bilaterally. Small bilateral hydroceles. Left-sided varicoceles. Fatty tissue versus less likely small amount of blood clot superior to the testicles.  -U/A was Negative and Urine Culture showed no growth -Follow up with Urology as an Outpatient  Left knee pain -H/O TKR.  -Joint mildly swollen and warm to touch but no erythema .  -Plain films showed no acute osseous findings or evidence of hardware loosening, but did show a knee joint effusion with increased  density in Hoffa's fat.  -ESR not elevated. Unable to get IR to perform arthrocentesis secondary to being on blood thinners.  -Dr. Rockne Menghini discussed with Orthopedic physician on call regarding need for joint aspiration.  -Dr. Alvan Dame Aspirated joint and aspirate seemed to have hemarthrosis from fall and coumadin use.  -Clotted aspirate so unable to appreciate Cell Count -Aspirate showed Red color, with Turbid Appearance, No Crystals were seen. WBC unable to be performed. Neutrophils were 92%, Lymphocytes 14%, Monocytes were 4% -Culture showed no growth  -[name] family got concerned it seemed bigger however stable on examination -Continue to Monitor and follow up with Orthopedics as an outpatient  Dyspnea -Resolved.  -Chest x-ray showed a left lower lobe early infiltrate versus atelectasis.  -Afebrile. No evidence of pneumonia clinically.  Coronary artery disease status post CABG -Continue Statin.  -Anticoagulated with Coumadin. Pharmacy Dosed  Chronic diastolic CHF, NYHA class II -Compensated at this time and currently not in exacerbation.  History of Complete Heart Block s/p Pacemaker -Continue Telemetry -Patient is followed by Dr. Lovena Le who reports his medtronic device is programmed VVIR. 9 years on the battery.  Aortic Stenosis s/p Aortic Valve replacement Goal INR 2.5-3.0 (3.38today). -Continued on Coumadin; -Hold Tonight's Dose and repeat INR in AM -C/w with Home Coumadin once INR is back in [place].   Chronic A-fib on Coumadin -INR 3.38  -Repeat INR in AM  Essential Hypertension Not currently on antihypertensive therapies. Blood pressure not elevated.  Normocytic Anemia -Stable. -Suspect anemia of chronic disease.  -No current indication for transfusion. -Hb/Hct was stable at 8.1/26.6 -Continue to monitor CBC at  [facility] -Has Hx of Anemia requiring Blood Transfusions -Continue to Monitor for S/Sx of Bleeding  Thrombocytopenia -Platelets went from 77  -> 72 -> 75 -> 81 -> 87 -> 109 -> 106 -Question related to blood thinners or possibly smoldering infection with Strep Bactermia.  -Follow-up HIT panel. -Repeat CBC at [place] corporis/Onychomycosis -Follow Up with ID as an outpatient  CKD Stage 3 -Review of previous GFR and Creatine shows basline is around 1.5/1.6 at baseline -Stable. BUN/Cr yesterday was 16/1.36 -Repeat BMP at [facility]  Discharge Instructions  Discharge Instructions    Call MD for:  difficulty breathing, headache or visual disturbances    Complete by:  As directed    Call MD for:  persistant dizziness or light-headedness    Complete by:  As directed    Call MD for:  persistant nausea and vomiting    Complete by:  As directed    Call MD for:  severe uncontrolled pain    Complete by:  As directed    Diet - low sodium heart healthy    Complete by:  As directed    Discharge instructions    Complete by:  As directed    Follow Up Care at [facility]. Please Hold Coumadin tonight and repeat INR in Am. Goal INR is supposed to be between 2.5-3.0   Increase activity slowly    Complete by:  As directed        Medication List    STOP taking these medications   traMADol 50 MG tablet Commonly known as:  ULTRAM     TAKE these medications   acetaminophen 325 MG tablet Commonly known as:  TYLENOL Take 2 tablets (650 mg total) by mouth every 6 (six) hours as needed for mild pain or fever. What changed:  when to take this  reasons to take this  additional instructions   cefTRIAXone 2 g in dextrose 5 % 50 mL Inject 2 g into the vein daily. Start taking on:  01/09/2016   diphenhydrAMINE 25 mg capsule Commonly known as:  BENADRYL Take 1 capsule (25 mg total) by mouth at bedtime as needed for sleep.   docusate sodium 100 MG capsule Commonly known as:  COLACE Take 1 capsule (100 mg total) by mouth 2 (two) times daily.   ferrous sulfate 75 (15 Fe) MG/ML Soln Commonly known as:   FER-IN-SOL Take 1 mL (15 mg of iron total) by mouth daily. Start taking on:  73/53/2992   folic acid 1 MG tablet Commonly known as:  FOLVITE Take 1 mg by mouth daily.   furosemide 20 MG tablet Commonly known as:  LASIX Take 20 mg by mouth daily. Prn with feet swelling   gabapentin 300 MG capsule Commonly known as:  NEURONTIN Take 300 mg by mouth at bedtime.   HYDROcodone-acetaminophen 10-325 MG tablet Commonly known as:  NORCO Take 1 tablet by mouth every 6 (six) hours as needed for moderate pain. What changed:  when to take this   predniSONE 5 MG tablet Commonly known as:  DELTASONE Take 5 mg by mouth daily with breakfast.   PRESERVISION AREDS 2 PO Take 1 capsule by mouth 2 (two) times daily.   PRILOSEC 20 MG capsule Generic drug:  omeprazole Take 20 mg by mouth daily.   PROCEL PO Take 1 scoop by mouth 2 (two) times daily.   rOPINIRole 2 MG tablet Commonly known as:  REQUIP Take 1 tablet (2 mg total) by mouth daily. What  changed:  medication strength  how much to take  when to take this   simvastatin 20 MG tablet Commonly known as:  ZOCOR Take 20 mg by mouth at bedtime.   warfarin 5 MG tablet Commonly known as:  COUMADIN Take 5-7.5 mg by mouth daily. Take 5 mg on Sun / Mon / Tues / Thurs / Fri / Sat  Take 7.5 mg on Wednesdays ONLY       Contact information for follow-up providers    Carlyle Basques, MD .   Specialty:  Infectious Diseases Why:  We will call you with your appointment times Contact information: [place] [place] [address] [phone]            Contact information for after-discharge care    Destination    [facility] .   Specialty:  [place] information: [address] [place] [phone]                 Allergies  Allergen Reactions  . Tape Other (See Comments)    Pulls skin off  . Vancomycin Other (See Comments)    Hearing  loss    Consultations:  Neurology  Cardiology  Infectious Diseases  Orthopedics   -Phone Consultation with Vascular Surgery Dr. Donnetta Hutching  Procedures/Studies: Dg Chest 2 View  Result Date: 12/30/2015 CLINICAL DATA:  Per RN notes as pt cannot provide info: [place] daughter called RN to come to room for "patient decline" (RN completed assessment 25 minutes prior, no new symptoms, per patient daughter patient remains same all day). EXAM: CHEST  2 VIEW COMPARISON:  03/04/2015 FINDINGS: Left-sided transvenous pacemaker lead to the right ventricle. Right-sided PICC line has been removed since the prior study. Status post median sternotomy, CABG, and valve replacement. The heart is enlarged. Calcified granulomata are present. There are small bilateral pleural effusions. There is minimal atelectasis or possible early infiltrate at the left lung base. IMPRESSION: 1. Cardiomegaly. 2. Left lower lobe atelectasis or early infiltrate. Electronically Signed   By: Nolon Nations M.D.   On: 12/30/2015 23:00   Ct Head Wo Contrast  Result Date: 12/30/2015 CLINICAL DATA:  82 year old male with left-sided weakness and slurred speech. Atrial fibrillation and hypertension. Initial encounter. EXAM: CT HEAD WITHOUT CONTRAST TECHNIQUE: Contiguous axial images were obtained from the base of the skull through the vertex without intravenous contrast. COMPARISON:  09/10/2012 head CT. FINDINGS: Brain: No intracranial hemorrhage. Moderate chronic microvascular changes without CT evidence of large acute infarct. No intracranial mass lesion noted on this unenhanced exam. Mild global atrophy without hydrocephalus. Vascular: Vascular calcifications. Skull: No acute abnormality.  No destructive lesion. Sinuses/Orbits: No acute orbital abnormality. Post lens replacement. Visualized paranasal sinuses, mastoid air cells and middle ear cavities are clear. Other: Negative IMPRESSION: Moderate chronic microvascular changes without CT  evidence of large acute infarct. No intracranial hemorrhage. Mild global atrophy. Electronically Signed   By: Genia Del M.D.   On: 12/30/2015 07:48   US Pelvis Limited  Result Date: 01/03/2016 CLINICAL DATA:  81 year old male with scrotal pain and swelling x3 months. Right groin pain. EXAM: SCROTAL ULTRASOUND DOPPLER ULTRASOUND OF THE TESTICLES TECHNIQUE: Complete ultrasound examination of the testicles, epididymis, and other scrotal structures was performed. Color and spectral Doppler ultrasound were also utilized to evaluate blood flow to the testicles. COMPARISON:  None. FINDINGS: Right testicle Measurements: 3.8 x 2.2 x 2.3 cm. There is a 4 x 2 x 4 mm intratesticular cyst. The  testicle demonstrates a normal echotexture. A rounded echogenic structure in the right scrotum likely represents a scrotal pearl. Left testicle Measurements: 3.0 x 2.1 x 2.5 cm. No mass or microlithiasis visualized. Right epididymis:  2.4 x 0.7 x 2.0 cm.  No hyperemia. Left epididymis: 1.3 x 0.9 x 1.4 cm. No hyperemia.Two small epididymal head cysts noted measuring up to 4 mm. Hyperechoic tissue superior to the testicles likely represent fat and less likely blood clot. Clinical correlation is recommended. Hydrocele: Small bilateral hydroceles containing low-level echogenic debris. Varicocele:  Left-sided varicoceles. Pulsed Doppler interrogation of both testes demonstrates normal low resistance arterial and venous waveforms bilaterally. IMPRESSION: Unremarkable testicles with Doppler detected flow bilaterally. Small bilateral hydroceles. Left-sided varicoceles. Fatty tissue versus less likely small amount of blood clot superior to the testicles. Clinical correlation is recommended. Electronically Signed   By: Anner Crete M.D.   On: 01/03/2016 22:14   US Scrotum  Result Date: 01/03/2016 CLINICAL DATA:  82 year old male with scrotal pain and swelling x3 months. Right groin pain. EXAM: SCROTAL ULTRASOUND DOPPLER ULTRASOUND  OF THE TESTICLES TECHNIQUE: Complete ultrasound examination of the testicles, epididymis, and other scrotal structures was performed. Color and spectral Doppler ultrasound were also utilized to evaluate blood flow to the testicles. COMPARISON:  None. FINDINGS: Right testicle Measurements: 3.8 x 2.2 x 2.3 cm. There is a 4 x 2 x 4 mm intratesticular cyst. The testicle demonstrates a normal echotexture. A rounded echogenic structure in the right scrotum likely represents a scrotal pearl. Left testicle Measurements: 3.0 x 2.1 x 2.5 cm. No mass or microlithiasis visualized. Right epididymis:  2.4 x 0.7 x 2.0 cm.  No hyperemia. Left epididymis: 1.3 x 0.9 x 1.4 cm. No hyperemia.Two small epididymal head cysts noted measuring up to 4 mm. Hyperechoic tissue superior to the testicles likely represent fat and less likely blood clot. Clinical correlation is recommended. Hydrocele: Small bilateral hydroceles containing low-level echogenic debris. Varicocele:  Left-sided varicoceles. Pulsed Doppler interrogation of both testes demonstrates normal low resistance arterial and venous waveforms bilaterally. IMPRESSION: Unremarkable testicles with Doppler detected flow bilaterally. Small bilateral hydroceles. Left-sided varicoceles. Fatty tissue versus less likely small amount of blood clot superior to the testicles. Clinical correlation is recommended. Electronically Signed   By: Anner Crete M.D.   On: 01/03/2016 22:14   Nm Wbc Scan Tumor  Result Date: 01/04/2016 CLINICAL DATA:  Fever of unknown origin. EXAM: NUCLEAR MEDICINE LEUKOCYTE SCAN TECHNIQUE: Following intravenous administration of radiolabeled white blood cells, images of the head, neck, trunk, and extremities were obtained on subsequent days. RADIOPHARMACEUTICALS:  9.2 technetium Ceretec COMPARISON:  CT scan 01/03/2016 and chest x-ray 12/30/2015 FINDINGS: Diffuse symmetric uptake in the lungs is likely due to early phase imaging. No focal uptake in the lungs to  suggest pneumonia or abscess. Normal expected uptake in the liver and spleen and normal activity in the bladder. No areas of abnormal activity to suggest a source of infection. IMPRESSION: Negative whole-body leukocyte scan. Electronically Signed   By: Marijo Sanes M.D.   On: 01/04/2016 17:05   [place] Art/ven Flow Abd Pelv Doppler  Result Date: 01/03/2016 CLINICAL DATA:  82 year old male with scrotal pain and swelling x3 months. Right groin pain. EXAM: SCROTAL ULTRASOUND DOPPLER ULTRASOUND OF THE TESTICLES TECHNIQUE: Complete ultrasound examination of the testicles, epididymis, and other scrotal structures was performed. Color and spectral Doppler ultrasound were also utilized to evaluate blood flow to the testicles. COMPARISON:  None. FINDINGS: Right testicle Measurements: 3.8 x 2.2 x 2.3 cm. There  is a 4 x 2 x 4 mm intratesticular cyst. The testicle demonstrates a normal echotexture. A rounded echogenic structure in the right scrotum likely represents a scrotal pearl. Left testicle Measurements: 3.0 x 2.1 x 2.5 cm. No mass or microlithiasis visualized. Right epididymis:  2.4 x 0.7 x 2.0 cm.  No hyperemia. Left epididymis: 1.3 x 0.9 x 1.4 cm. No hyperemia.Two small epididymal head cysts noted measuring up to 4 mm. Hyperechoic tissue superior to the testicles likely represent fat and less likely blood clot. Clinical correlation is recommended. Hydrocele: Small bilateral hydroceles containing low-level echogenic debris. Varicocele:  Left-sided varicoceles. Pulsed Doppler interrogation of both testes demonstrates normal low resistance arterial and venous waveforms bilaterally. IMPRESSION: Unremarkable testicles with Doppler detected flow bilaterally. Small bilateral hydroceles. Left-sided varicoceles. Fatty tissue versus less likely small amount of blood clot superior to the testicles. Clinical correlation is recommended. Electronically Signed   By: Anner Crete M.D.   On: 01/03/2016 22:14   Dg Chest Port 1  View  Result Date: 01/05/2016 CLINICAL DATA:  PICC line repositioned EXAM: PORTABLE CHEST 1 VIEW COMPARISON:  Chest radiograph from earlier today. FINDINGS: Right PICC tip overlies the right atrium near the cavoatrial junction. Sternotomy wires appear aligned and intact. Stable configuration of single lead left subclavian pacemaker. Surgical clips overlie the bilateral axilla and left upper abdomen. Stable cardiomediastinal silhouette with mild cardiomegaly and aortic atherosclerosis. No pneumothorax. Stable trace right pleural effusion. Mild pulmonary edema. Improved aeration at the lung bases with mild left basilar atelectasis. IMPRESSION: 1. Right PICC terminates in the right atrium near the cavoatrial junction. 2. Stable mild congestive heart failure. 3. Trace right pleural effusion. 4. Mild left basilar atelectasis . Electronically Signed   By: Ilona Sorrel M.D.   On: 01/05/2016 12:37   Dg Chest Port 1 View  Result Date: 01/05/2016 CLINICAL DATA:  Post right upper extremity PICC line placement. EXAM: PORTABLE CHEST 1 VIEW COMPARISON:  12/30/2015 FINDINGS: Grossly unchanged enlarged cardiac silhouette and mediastinal contours post median sternotomy and CABG. Interval placement of right upper extremity approach PICC line with tip projected over the superior aspect of the right atrium. Otherwise, stable positioning of support apparatus. The pulmonary vasculature appears less distinct on the present examination with cephalization of flow. Suspected development of a trace left-sided effusion with worsening bibasilar heterogeneous / consolidative opacities, left greater than right. No pneumothorax. No acute osseus abnormalities. Surgical clips overlie the right axilla. IMPRESSION: 1. Right upper extremity approach PICC line tip projects over the superior aspect of the right atrium. Retraction approximately 5 cm would put the tip at the superior cavoatrial junction. 2. Findings suggestive of worsening  pulmonary edema with development of a small left-sided effusion and worsening bibasilar opacities, left greater than right, atelectasis versus infiltrate. Electronically Signed   By: Sandi Mariscal M.D.   On: 01/05/2016 10:37   Dg Knee Complete 4 Views Left  Result Date: 12/31/2015 CLINICAL DATA:  Left knee pain after falling 10 days ago. Stroke-like symptoms for 2 days. History of total knee arthroplasty in 2014. EXAM: LEFT KNEE - COMPLETE 4+ VIEW COMPARISON:  None. FINDINGS: The bones appear mildly demineralized. Patient is status post left total knee arthroplasty. The hardware is intact and well positioned. No evidence of acute fracture, dislocation or loosening. There is a small knee joint effusion with increased density in Hoffa's fat. There are diffuse vascular calcifications. IMPRESSION: No acute osseous findings or evidence of hardware loosening status post total knee arthroplasty. Knee joint effusion with  increased density in Hoffa's fat. Electronically Signed   By: Richardean Sale M.D.   On: 12/31/2015 20:01   Ct Angio Abd/pel W/ And/or W/o  Result Date: 01/03/2016 CLINICAL DATA:  Infection EXAM: CTA ABDOMEN AND PELVIS wITHOUT AND WITH CONTRAST TECHNIQUE: Multidetector CT imaging of the abdomen and pelvis was performed using the standard protocol during bolus administration of intravenous contrast. Multiplanar reconstructed images and MIPs were obtained and reviewed to evaluate the vascular anatomy. CONTRAST:  100 cc Isovue 370 COMPARISON:  02/25/2015 FINDINGS: VASCULAR Aorta: Aorto bi-iliac stent graft remains in place. Maximal sac diameter is 6.3 cm. Previous maximal sac diameter was 5.6 cm. Precontrast images were not included. Endoleak cannot be excluded by this study. The aortic and iliac limbs are patent. Celiac: Patent. SMA: Patent. Renals: Single right renal artery is patent. Single left renal artery is patent. IMA: Origin is occluded.  Branches reconstitute. Inflow: Native distal right  common iliac artery is patent. Right internal and external iliac arteries are patent. Native left common iliac artery is patent. There is mild disease at the origin of the left internal iliac artery. It is ectatic with a maximal diameter of 1.3 cm. There is focal 50% narrowing of the distal left external iliac artery. It is otherwise patent. Proximal Outflow: Proximal femoral arteries are grossly patent. Veins: There are no venous phase images on this study. Venous structures are non-opacified. Review of the MIP images confirms the above findings. NON-VASCULAR Lower chest: Small bilateral pleural effusions. Bibasilar ground-glass opacities are likely related to volume loss. The heart is moderately enlarged. Hepatobiliary: Calcified granulomata are seen in the liver. Postcholecystectomy. Pancreas: Unremarkable Spleen: Calcified granulomata are scattered throughout the spleen. Adrenals/Urinary Tract: Simple cysts in the right kidney. previously visualized large cyst emanating from the lower pole of the left kidney has markedly reduced in size. Kidneys are somewhat atrophic. Adrenal glands are stable in appearance. Stomach/Bowel: There is irregular wall thickening within the region of the antrum of the stomach. See image 74. Along the anterior wall, there is a polypoid filling defect. Adenocarcinoma cannot be excluded. Surgical staples are seen about the stomach. No obvious focal mass involving the colon. Lymphatic: No abnormal retroperitoneal adenopathy. Reproductive: Bladder is unremarkable.  Status post TURP. Other: There is no free fluid layering in the pelvis. Musculoskeletal: Advanced L4 compression fracture has slightly progressed since the prior study. The inferior endplate of L3 has an abnormal appearance. There has been subtle resort shin of some of the bone compared with the prior study. This may be posttraumatic. An inflammatory process is not excluded. 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 Endoleak cannot be excluded by this study. NON-VASCULAR There is a polypoid filling defect along the anterior wall of the antrum of the stomach. Adenocarcinoma is not excluded. Endoscopy is recommended. An L4 compression fracture has progressed. There has also been some resorption at the L3 inferior endplate. An inflammatory or infectious process process are not excluded. Consider MRI to further evaluate. Electronically Signed   By: Marybelle Killings M.D.   On: 01/03/2016 17:05    TRANSTHORACIC ECHOCARDIOGRAM Study Conclusions  - Left ventricle: The cavity size was normal. Wall thickness was   increased in a pattern of moderate LVH. Systolic function was   mildly to moderately reduced. The estimated ejection fraction was   in the range of 40% to 45%. - Aortic valve: There was moderate  stenosis. Valve area (VTI): 1.19   cm^2. Valve area (Vmax): 1.15 cm^2. Valve area (Vmean): 1.11   cm^2. - Mitral valve: Moderately calcified annulus. There was moderate to   severe regurgitation. - Left atrium: The atrium was massively dilated. - Right atrium: The atrium was moderately dilated.  TRANSESOPHAGEAL ECHOCARDIOGRAM Study Conclusions  - Left ventricle: The cavity size was normal. Wall thickness was   normal. Systolic function was normal. The estimated ejection   fraction was in the range of 55% to 60%. - Aortic valve: A prosthesis was present and functioning normally.   The prosthesis had a normal range of motion. The sewing ring   appeared normal, had no rocking motion, and showed no evidence of   dehiscence. - Mitral valve: No evidence of vegetation. There was mild to   moderate regurgitation. - Left atrium: No evidence of thrombus in the atrial cavity or   appendage. No evidence of thrombus in the appendage. - Right ventricle: Pacer wire or catheter noted in right ventricle.   No vegetation -  Right atrium: No evidence of thrombus in the atrial cavity or   appendage. - Atrial septum: There was a patent foramen ovale. - Tricuspid valve: No evidence of vegetation. - Pulmonic valve: No evidence of vegetation.  Impressions:  - No evidence of endocarditis. There was no evidence of a   vegetation.  CAROTID DUPLEX Summary:  - Limited study due to patient`s lack of cooperation. - Right: mild mixed plaque origin and proximal ICA and ECA.   Vertebral artery flow is antegrade, bilaterally.  ULTRASOUND OF PELVIS AND SCROTUM Unremarkable testicles with Doppler detected flow bilaterally.  Small bilateral hydroceles.  Left-sided varicoceles.  Fatty tissue versus less likely small amount of blood clot superior to the testicles. Clinical correlation is recommended.  Subjective: Seen and examined at bedside and doing well. Denied any CP or SOB. Ready to go to [employer] so he can go home.   Discharge Exam: Vitals:   01/07/16 2129 01/08/16 1030  BP: 136/80 (!) 103/48  Pulse: 69 71  Resp: 18 19  Temp:  98 F (36.7 C)   Vitals:   01/07/16 1428 01/07/16 1820 01/07/16 2129 01/08/16 1030  BP: 123/77 123/69 136/80 (!) 103/48  Pulse: 76 82 69 71  Resp: 18 18 18 19   Temp: 98.7 F (37.1 C) 98.4 F (36.9 C) 98.4 F (36.9 C) 98 F (36.7 C)  TempSrc: Oral Oral Oral Oral  SpO2: 98% 98% 99% 99%  Weight:      Height:       General: Pt is alert, awake, not in acute distress Cardiovascular: Irregularly Irregular, S1/S2 +, no rubs, no gallops Respiratory: CTA bilaterally, no wheezing, no rhonchi Abdominal: Soft, NT, ND, bowel sounds + Extremities: Mild LE edema, no cyanosis; Left foot bruise   The results of significant diagnostics from this hospitalization (including imaging, microbiology, ancillary and laboratory) are listed below for reference.    Microbiology: Recent Results (from the past 240 hour(s))  Culture, blood (routine x 2)     Status: None    Collection Time: 12/29/15 11:20 PM  Result Value Ref Range Status   Specimen Description BLOOD RIGHT ARM  Final   Special Requests BOTTLES DRAWN AEROBIC AND ANAEROBIC 5ML  Final   Culture NO GROWTH 5 DAYS  Final   Report Status 01/04/2016 FINAL  Final  Culture, blood (routine x 2)     Status: None   Collection Time: 12/29/15 11:26 PM  Result Value Ref  Range Status   Specimen Description BLOOD RIGHT ARM  Final   Special Requests BOTTLES DRAWN AEROBIC AND ANAEROBIC 5ML  Final   Culture NO GROWTH 5 DAYS  Final   Report Status 01/04/2016 FINAL  Final  Culture, blood (routine x 2)     Status: None   Collection Time: 01/02/16 12:14 AM  Result Value Ref Range Status   Specimen Description BLOOD LEFT HAND  Final   Special Requests BOTTLES DRAWN AEROBIC AND ANAEROBIC 5CC  Final   Culture NO GROWTH 5 DAYS  Final   Report Status 01/07/2016 FINAL  Final  Culture, blood (routine x 2)     Status: None   Collection Time: 01/02/16 12:21 AM  Result Value Ref Range Status   Specimen Description BLOOD RIGHT ANTECUBITAL  Final   Special Requests BOTTLES DRAWN AEROBIC AND ANAEROBIC 5CC  Final   Culture NO GROWTH 5 DAYS  Final   Report Status 01/07/2016 FINAL  Final  Body fluid culture     Status: None   Collection Time: 01/02/16 12:27 PM  Result Value Ref Range Status   Specimen Description FLUID LEFT KNEE  Final   Special Requests NONE  Final   Gram Stain   Final    FEW WBC PRESENT, PREDOMINANTLY PMN NO ORGANISMS SEEN    Culture NO GROWTH 3 DAYS  Final   Report Status 01/06/2016 FINAL  Final  Culture, Urine     Status: None   Collection Time: 01/04/16  1:40 AM  Result Value Ref Range Status   Specimen Description URINE, RANDOM  Final   Special Requests NONE  Final   Culture NO GROWTH  Final   Report Status 01/05/2016 FINAL  Final    Labs: BNP (last 3 results)  Recent Labs  02/21/15 1615  BNP 161.0*   Basic Metabolic Panel:  Recent Labs Lab 01/02/16 0357 01/03/16 0440  01/04/16 0457 01/05/16 0758 01/06/16 0540  NA 135 137 135 135 136  K 3.5 4.1 3.5 3.4* 3.7  CL 102 103 102 102 104  CO2 25 26 26 24 27   GLUCOSE 97 105* 91 94 93  BUN 12 14 16 17 16   CREATININE 1.47* 1.49* 1.47* 1.51* 1.36*  CALCIUM 8.7* 9.0 8.8* 8.6* 8.6*  MG  --   --  1.7 1.7 1.7  PHOS  --   --  1.9* 1.6* 1.8*   Liver Function Tests:  Recent Labs Lab 01/04/16 0457 01/05/16 0758 01/06/16 0540  AST 18 18 20   ALT 12* 13* 14*  ALKPHOS 65 71 76  BILITOT 1.0 0.8 0.9  PROT 5.9* 5.5* 5.9*  ALBUMIN 2.7* 2.7* 2.8*   No results for input(s): LIPASE, AMYLASE in the last 168 hours. No results for input(s): AMMONIA in the last 168 hours. CBC:  Recent Labs Lab 01/04/16 0457 01/05/16 0758 01/06/16 0540 01/07/16 0430 01/08/16 0325  WBC 4.5 5.2 4.7 4.8 4.6  NEUTROABS 3.4  --   --   --   --   HGB 8.5* 8.4* 8.2* 8.9* 8.1*  HCT 27.2* 26.8* 26.2* 29.1* 26.6*  MCV 92.8 94.4 94.2 96.7 95.0  PLT 75* 81* 87* 109* 106*   Cardiac Enzymes: No results for input(s): CKTOTAL, CKMB, CKMBINDEX, TROPONINI in the last 168 hours. BNP: Invalid input(s): POCBNP CBG:  Recent Labs Lab 01/01/16 2315  GLUCAP 107*   D-Dimer No results for input(s): DDIMER in the last 72 hours. Hgb A1c No results for input(s): HGBA1C in the last 72 hours. Lipid Profile  No results for input(s): CHOL, HDL, LDLCALC, TRIG, CHOLHDL, LDLDIRECT in the last 72 hours. Thyroid function studies No results for input(s): TSH, T4TOTAL, T3FREE, THYROIDAB in the last 72 hours.  Invalid input(s): FREET3 Anemia work up No results for input(s): VITAMINB12, FOLATE, FERRITIN, TIBC, IRON, RETICCTPCT in the last 72 hours. Urinalysis    Component Value Date/Time   COLORURINE YELLOW 01/04/2016 0140   APPEARANCEUR CLEAR 01/04/2016 0140   LABSPEC >1.046 (H) 01/04/2016 0140   PHURINE 6.0 01/04/2016 0140   GLUCOSEU NEGATIVE 01/04/2016 0140   HGBUR LARGE (A) 01/04/2016 0140   BILIRUBINUR NEGATIVE 01/04/2016 0140   BILIRUBINUR  negative 02/23/2015 1635   KETONESUR NEGATIVE 01/04/2016 0140   PROTEINUR 100 (A) 01/04/2016 0140   UROBILINOGEN 2.0 02/23/2015 1635   UROBILINOGEN 1.0 01/20/2013 1223   NITRITE NEGATIVE 01/04/2016 0140   LEUKOCYTESUR NEGATIVE 01/04/2016 0140   Sepsis Labs Invalid input(s): PROCALCITONIN,  WBC,  LACTICIDVEN Microbiology Recent Results (from the past 240 hour(s))  Culture, blood (routine x 2)     Status: None   Collection Time: 12/29/15 11:20 PM  Result Value Ref Range Status   Specimen Description BLOOD RIGHT ARM  Final   Special Requests BOTTLES DRAWN AEROBIC AND ANAEROBIC 5ML  Final   Culture NO GROWTH 5 DAYS  Final   Report Status 01/04/2016 FINAL  Final  Culture, blood (routine x 2)     Status: None   Collection Time: 12/29/15 11:26 PM  Result Value Ref Range Status   Specimen Description BLOOD RIGHT ARM  Final   Special Requests BOTTLES DRAWN AEROBIC AND ANAEROBIC 5ML  Final   Culture NO GROWTH 5 DAYS  Final   Report Status 01/04/2016 FINAL  Final  Culture, blood (routine x 2)     Status: None   Collection Time: 01/02/16 12:14 AM  Result Value Ref Range Status   Specimen Description BLOOD LEFT HAND  Final   Special Requests BOTTLES DRAWN AEROBIC AND ANAEROBIC 5CC  Final   Culture NO GROWTH 5 DAYS  Final   Report Status 01/07/2016 FINAL  Final  Culture, blood (routine x 2)     Status: None   Collection Time: 01/02/16 12:21 AM  Result Value Ref Range Status   Specimen Description BLOOD RIGHT ANTECUBITAL  Final   Special Requests BOTTLES DRAWN AEROBIC AND ANAEROBIC 5CC  Final   Culture NO GROWTH 5 DAYS  Final   Report Status 01/07/2016 FINAL  Final  Body fluid culture     Status: None   Collection Time: 01/02/16 12:27 PM  Result Value Ref Range Status   Specimen Description FLUID LEFT KNEE  Final   Special Requests NONE  Final   Gram Stain   Final    FEW WBC PRESENT, PREDOMINANTLY PMN NO ORGANISMS SEEN    Culture NO GROWTH 3 DAYS  Final   Report Status 01/06/2016  FINAL  Final  Culture, Urine     Status: None   Collection Time: 01/04/16  1:40 AM  Result Value Ref Range Status   Specimen Description URINE, RANDOM  Final   Special Requests NONE  Final   Culture NO GROWTH  Final   Report Status 01/05/2016 FINAL  Final   Time coordinating discharge: Over 30 minutes  SIGNED:  Kerney Elbe, DO [facility] 01/08/2016, 12:51 PM Pager [phone]  If 7PM-7AM, please contact night-coverage [email or web address] Password [email or web address]

## 2016-01-08 NOTE — Progress Notes (Signed)
Patient is discharged from room 5M03 at this time. Alert and in stable condition. Report given to receiving nurse Jackquline BerlinNickie Shepherd[facility] with all questions answered. Left unit via wheelchair with family and all belongings at side.

## 2016-02-09 DEATH — deceased

## 2016-02-19 ENCOUNTER — Inpatient Hospital Stay: Payer: Medicare PPO | Admitting: Internal Medicine

## 2016-03-06 ENCOUNTER — Encounter: Payer: Medicare PPO | Admitting: *Deleted

## 2016-03-06 ENCOUNTER — Telehealth: Payer: Self-pay | Admitting: Cardiology

## 2016-03-06 NOTE — Telephone Encounter (Signed)
Confirmed remote transmission w/ pt daughter. She informed me that pt passed away on 06/22/15. She will bring monitor back to office to be returned back to [facility].

## 2017-09-26 IMAGING — CR DG THORACIC SPINE 2V
3 series · 3 of 3 positions shown · non-contrast
Comparison: Chest x-ray dated 01/20/2013.

CLINICAL DATA: Lower back pain since [DATE] this afternoon. History
of chronic back pain. No injury noted.

EXAM:
THORACIC SPINE 2 VIEWS

[t thoracic spine ap]
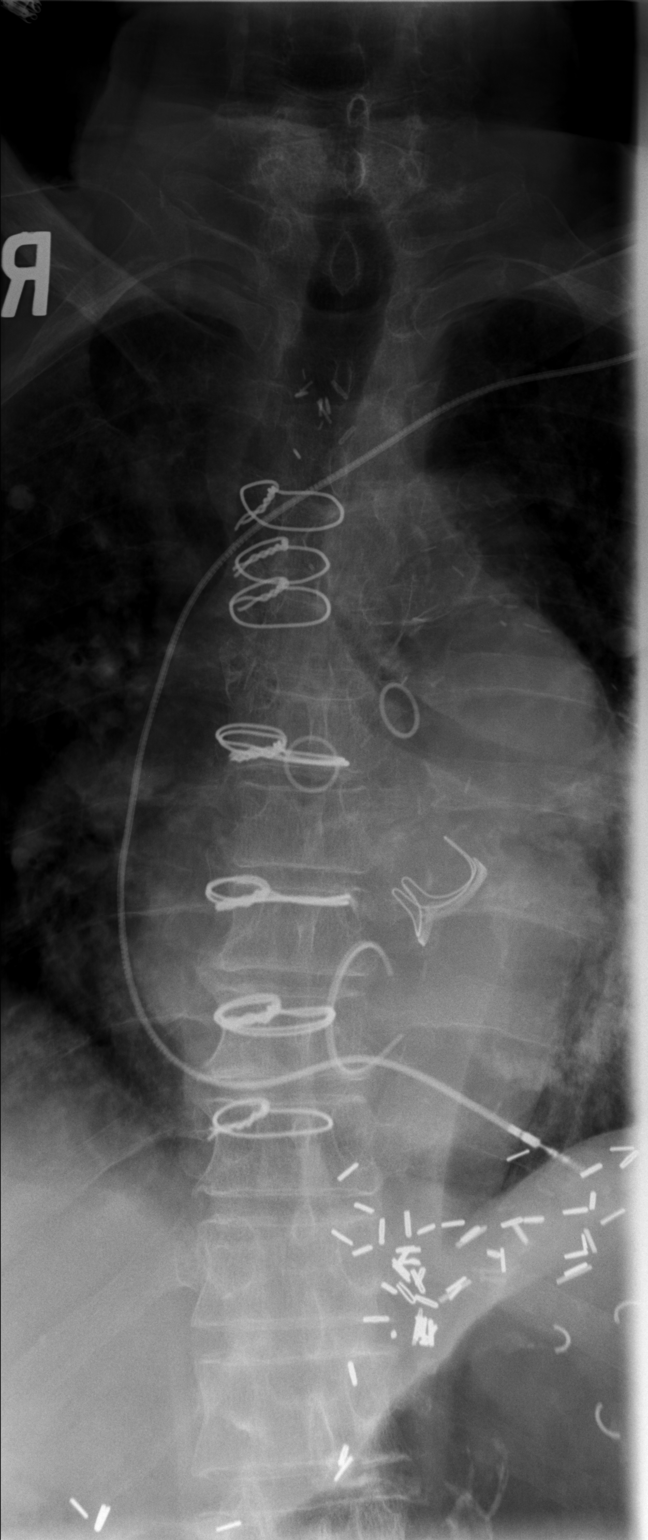

[t thoracic spine lat]
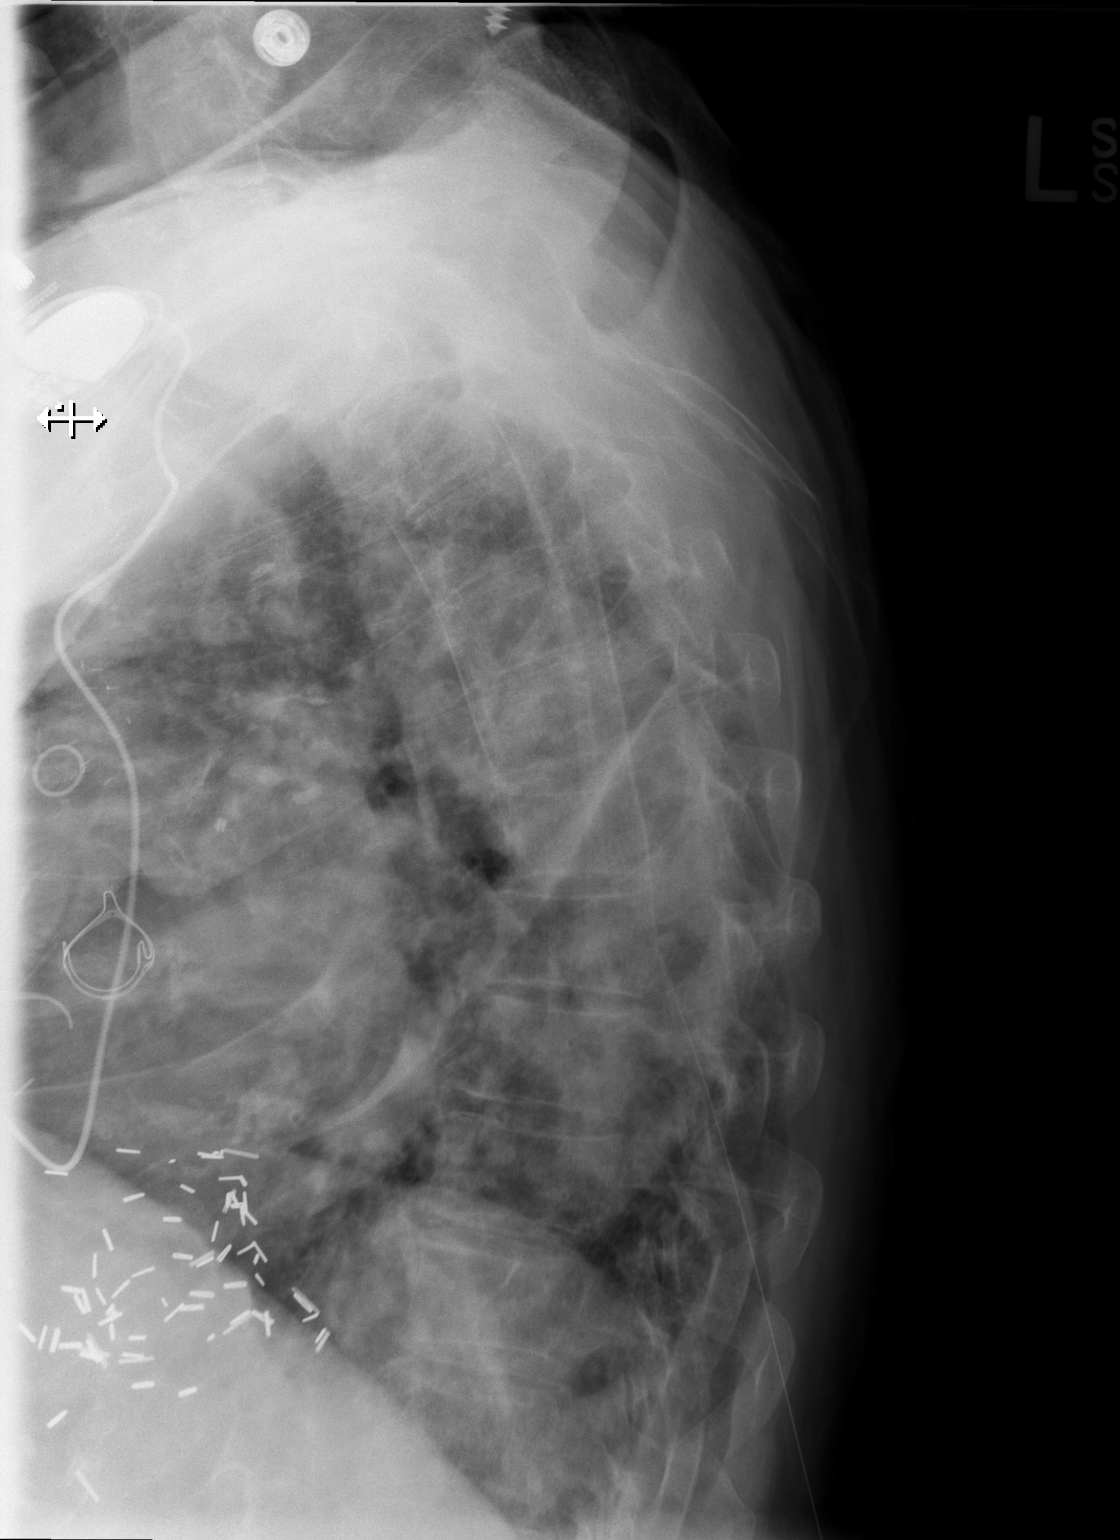

[t thoracic swimmers]
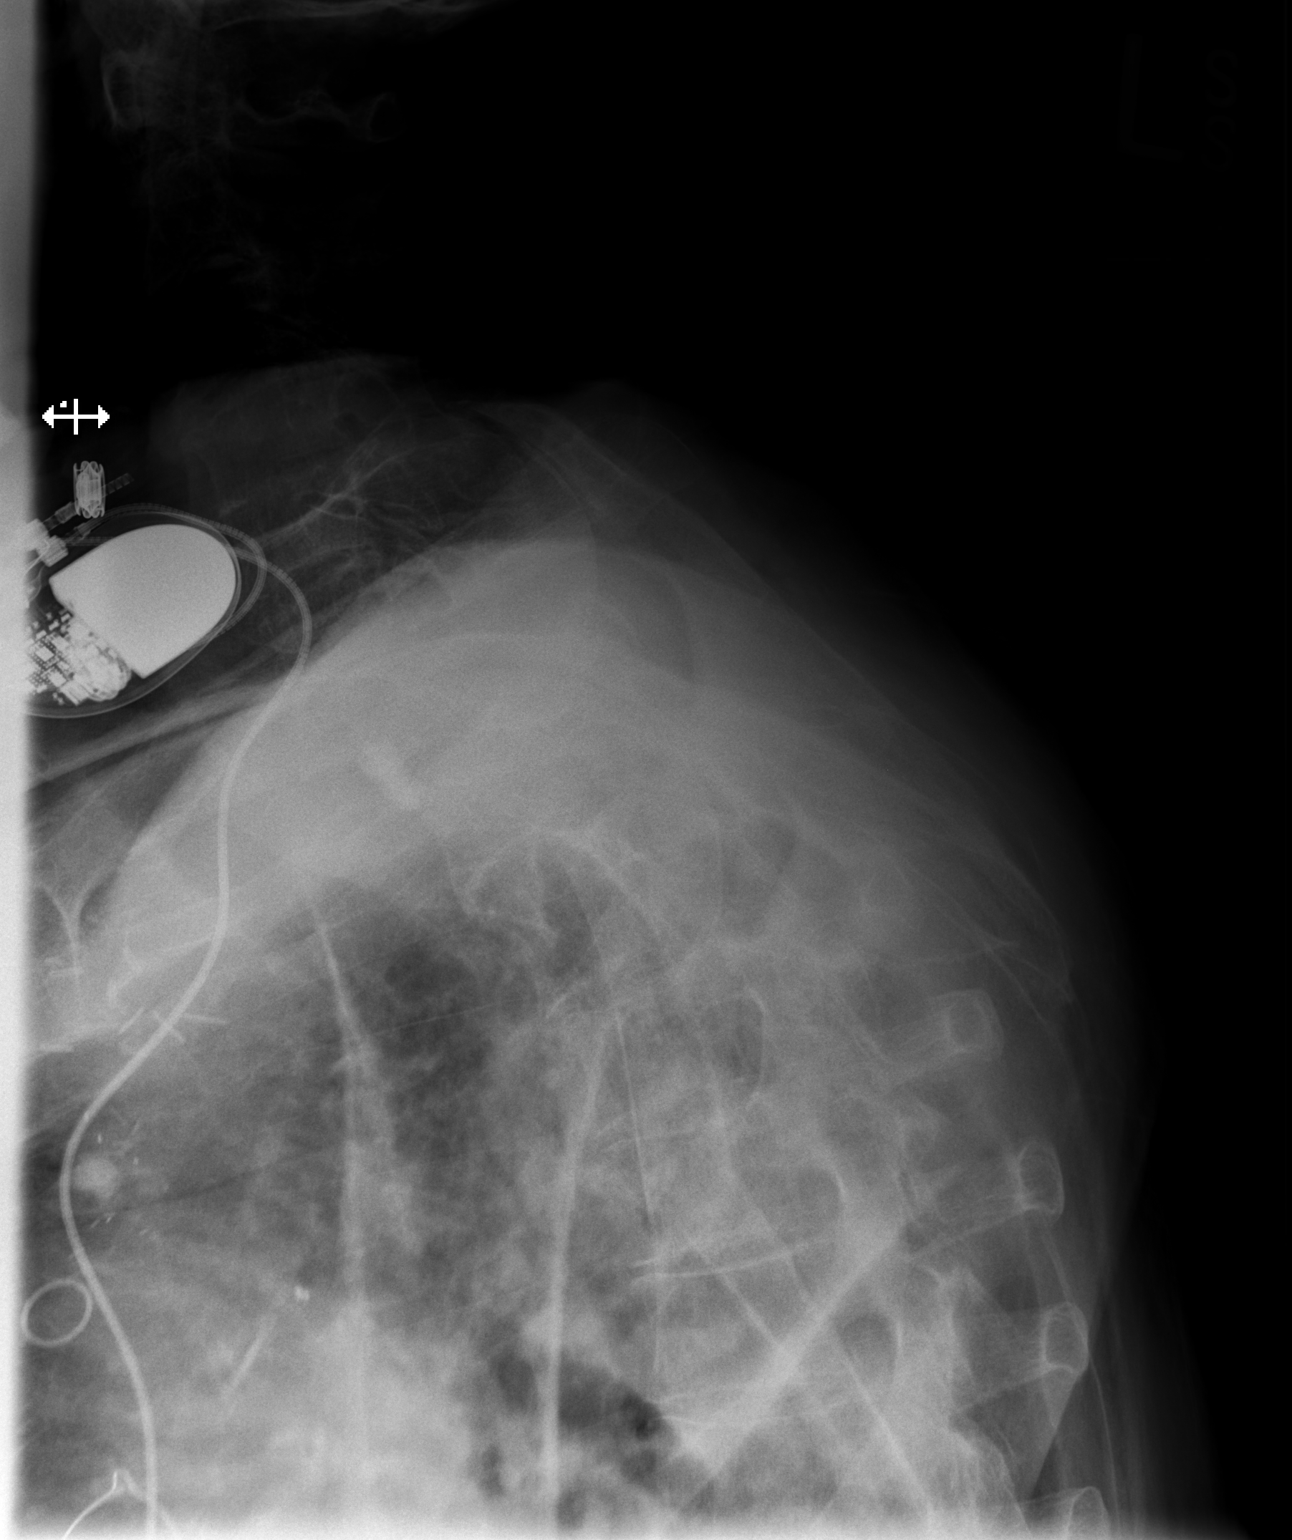

[3 of 3 positions shown; findings below may reference images not displayed]

FINDINGS: Portions of the thoracic spine are incompletely seen due to
overlying osseous and soft tissue structures. Overall osseous
alignment appears normal and grossly stable compared to a previous
chest x-ray of 01/20/2013. No osseous fracture or dislocation seen.
No acute - appearing cortical irregularity or osseous lesion.
Paravertebral soft tissues are unremarkable for acute process.
IMPRESSION: No acute findings.

## 2017-10-02 IMAGING — CR DG CHEST 1V PORT
1 series · 1 of 1 positions shown · non-contrast
Comparison: 02/25/2015 and earlier.

CLINICAL DATA: Bedside PICC placement.

EXAM:
PORTABLE CHEST 1 VIEW

[AP]
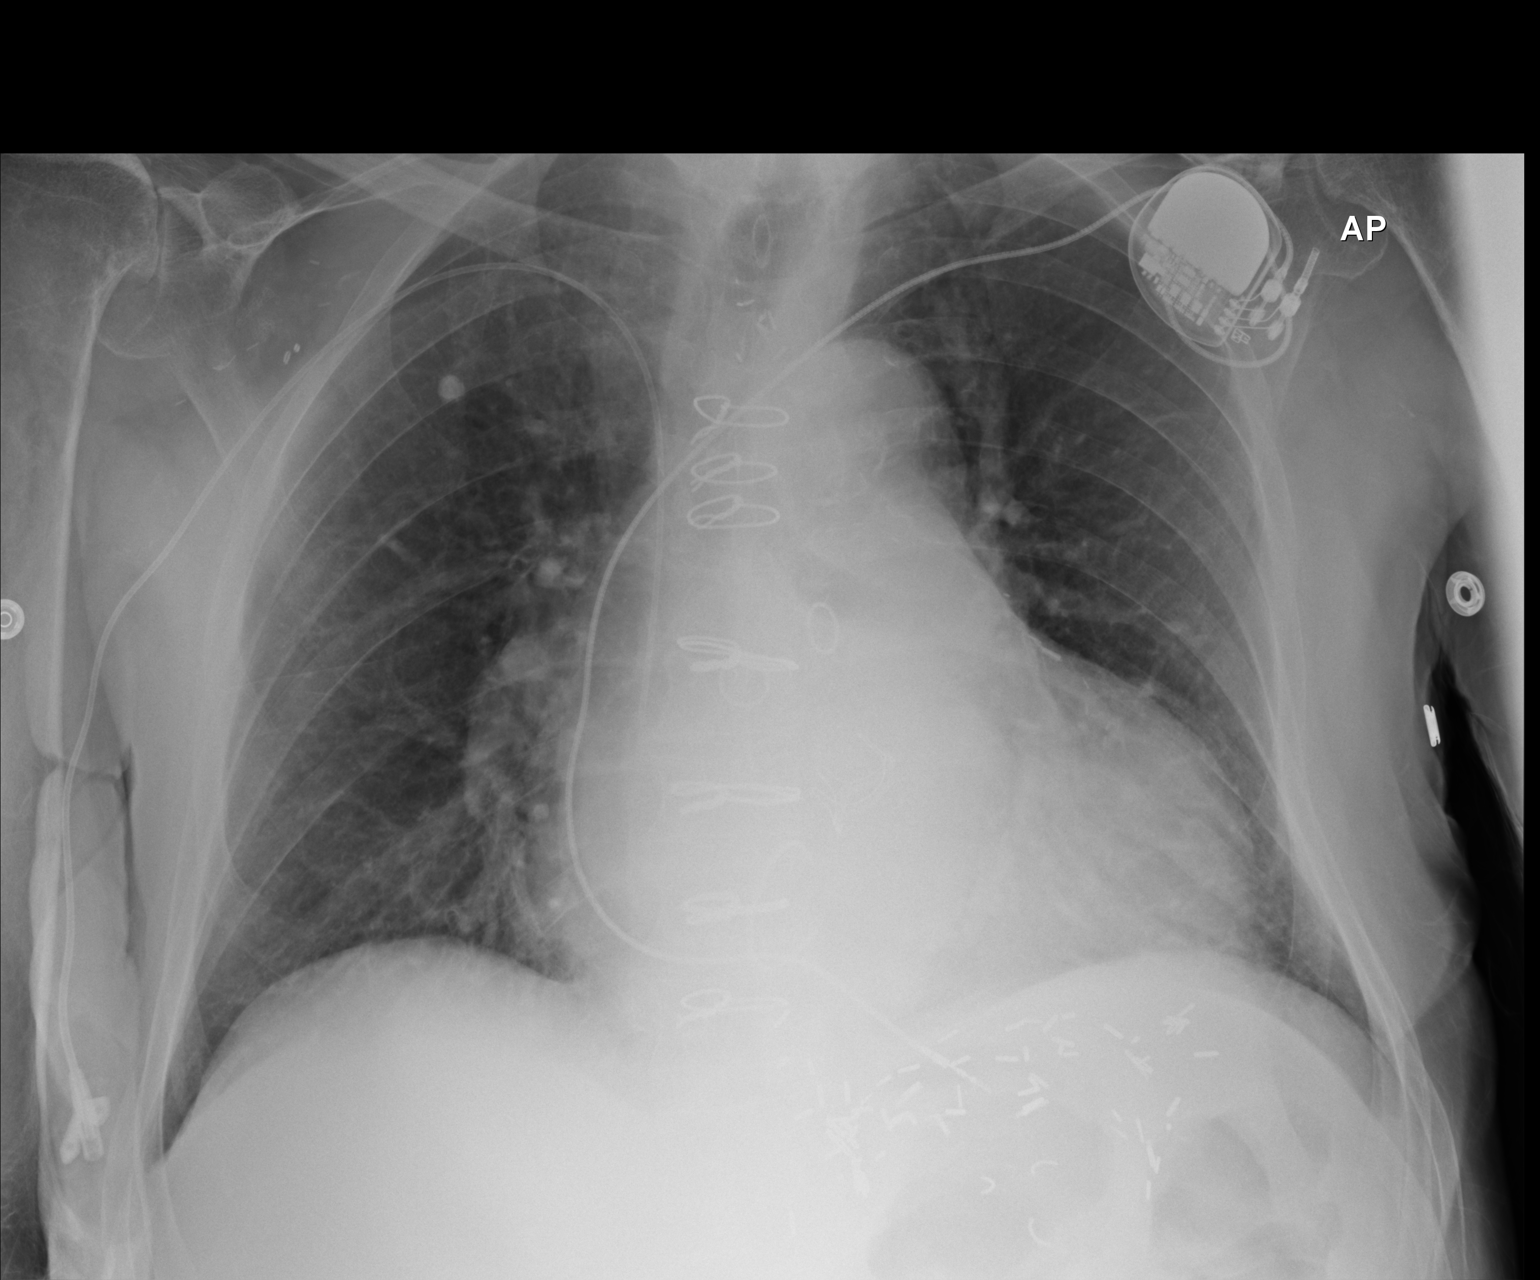

[1 of 1 positions shown; findings below may reference images not displayed]

FINDINGS: Right arm PICC tip projects over the lower SVC near the cavoatrial
junction. Prior sternotomy for CABG, aortic valve replacement and
mitral valve replacement or annuloplasty. Stable moderate to marked
cardiomegaly. Pulmonary venous hypertension without overt edema.
Calcified granuloma in the right upper lobe. Lungs otherwise clear.
No pneumothorax. No visible pleural effusions. Left subclavian
single lead transvenous pacemaker unchanged.
IMPRESSION: 1. Right arm PICC tip projects over the lower SVC near the
cavoatrial junction.
2. Stable moderate to marked cardiomegaly. No acute cardiopulmonary
disease.
